# Patient Record
Sex: Female | Born: 1965 | Race: White | Hispanic: No | Marital: Married | Smoking: Never smoker
Health system: Southern US, Community
[De-identification: ages and names within clinical notes are randomized; demographics above are authoritative.]

## PROBLEM LIST (undated history)

## (undated) ENCOUNTER — Emergency Department: Discharge status: Absent without leave | Payer: Self-pay | Source: Home / Self Care

## (undated) DIAGNOSIS — I10 Essential (primary) hypertension: Secondary | ICD-10-CM

## (undated) DIAGNOSIS — E785 Hyperlipidemia, unspecified: Secondary | ICD-10-CM

## (undated) DIAGNOSIS — M1990 Unspecified osteoarthritis, unspecified site: Secondary | ICD-10-CM

## (undated) DIAGNOSIS — E119 Type 2 diabetes mellitus without complications: Secondary | ICD-10-CM

## (undated) DIAGNOSIS — E669 Obesity, unspecified: Secondary | ICD-10-CM

## (undated) DIAGNOSIS — M549 Dorsalgia, unspecified: Secondary | ICD-10-CM

## (undated) DIAGNOSIS — E039 Hypothyroidism, unspecified: Secondary | ICD-10-CM

## (undated) DIAGNOSIS — F429 Obsessive-compulsive disorder, unspecified: Secondary | ICD-10-CM

## (undated) DIAGNOSIS — I773 Arterial fibromuscular dysplasia: Secondary | ICD-10-CM

## (undated) DIAGNOSIS — F32A Depression, unspecified: Secondary | ICD-10-CM

## (undated) DIAGNOSIS — M2550 Pain in unspecified joint: Secondary | ICD-10-CM

## (undated) DIAGNOSIS — D649 Anemia, unspecified: Secondary | ICD-10-CM

## (undated) DIAGNOSIS — M4307 Spondylolysis, lumbosacral region: Secondary | ICD-10-CM

## (undated) DIAGNOSIS — F419 Anxiety disorder, unspecified: Secondary | ICD-10-CM

## (undated) DIAGNOSIS — K219 Gastro-esophageal reflux disease without esophagitis: Secondary | ICD-10-CM

## (undated) DIAGNOSIS — Z8759 Personal history of other complications of pregnancy, childbirth and the puerperium: Secondary | ICD-10-CM

## (undated) DIAGNOSIS — T7840XA Allergy, unspecified, initial encounter: Secondary | ICD-10-CM

## (undated) DIAGNOSIS — E079 Disorder of thyroid, unspecified: Secondary | ICD-10-CM

## (undated) DIAGNOSIS — G5600 Carpal tunnel syndrome, unspecified upper limb: Secondary | ICD-10-CM

## (undated) DIAGNOSIS — Z8739 Personal history of other diseases of the musculoskeletal system and connective tissue: Secondary | ICD-10-CM

## (undated) DIAGNOSIS — F329 Major depressive disorder, single episode, unspecified: Secondary | ICD-10-CM

## (undated) HISTORY — DX: Obesity, unspecified: E66.9

## (undated) HISTORY — DX: Personal history of other complications of pregnancy, childbirth and the puerperium: Z87.59

## (undated) HISTORY — PX: TUBAL LIGATION: SHX77

## (undated) HISTORY — DX: Spondylolysis, lumbosacral region: M43.07

## (undated) HISTORY — DX: Obsessive-compulsive disorder, unspecified: F42.9

## (undated) HISTORY — DX: Allergy, unspecified, initial encounter: T78.40XA

## (undated) HISTORY — PX: ESOPHAGOGASTRODUODENOSCOPY: SHX1529

## (undated) HISTORY — DX: Anxiety disorder, unspecified: F41.9

## (undated) HISTORY — PX: SPINE SURGERY: SHX786

## (undated) HISTORY — DX: Unspecified osteoarthritis, unspecified site: M19.90

## (undated) HISTORY — PX: ECTOPIC PREGNANCY SURGERY: SHX613

## (undated) HISTORY — DX: Pain in unspecified joint: M25.50

## (undated) HISTORY — DX: Hypothyroidism, unspecified: E03.9

## (undated) HISTORY — DX: Arterial fibromuscular dysplasia: I77.3

## (undated) HISTORY — DX: Anemia, unspecified: D64.9

## (undated) HISTORY — PX: ENDOMETRIAL ABLATION: SHX621

## (undated) HISTORY — DX: Carpal tunnel syndrome, unspecified upper limb: G56.00

## (undated) HISTORY — DX: Personal history of other diseases of the musculoskeletal system and connective tissue: Z87.39

## (undated) HISTORY — PX: BACK SURGERY: SHX140

## (undated) HISTORY — PX: UPPER GASTROINTESTINAL ENDOSCOPY: SHX188

## (undated) HISTORY — DX: Dorsalgia, unspecified: M54.9

## (undated) HISTORY — DX: Depression, unspecified: F32.A

## (undated) HISTORY — DX: Type 2 diabetes mellitus without complications: E11.9

## (undated) HISTORY — DX: Gastro-esophageal reflux disease without esophagitis: K21.9

## (undated) HISTORY — DX: Essential (primary) hypertension: I10

---
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
## 2013-05-12 DIAGNOSIS — F329 Major depressive disorder, single episode, unspecified: Secondary | ICD-10-CM | POA: Insufficient documentation

## 2013-08-22 ENCOUNTER — Ambulatory Visit: Payer: 59 | Admitting: Physical Therapy

## 2013-08-22 DIAGNOSIS — IMO0002 Reserved for concepts with insufficient information to code with codable children: Secondary | ICD-10-CM

## 2013-08-22 DIAGNOSIS — M25569 Pain in unspecified knee: Secondary | ICD-10-CM

## 2013-08-22 DIAGNOSIS — M6281 Muscle weakness (generalized): Secondary | ICD-10-CM

## 2013-08-22 DIAGNOSIS — M5126 Other intervertebral disc displacement, lumbar region: Secondary | ICD-10-CM

## 2013-08-22 DIAGNOSIS — M2560 Stiffness of unspecified joint, not elsewhere classified: Secondary | ICD-10-CM

## 2013-08-24 DIAGNOSIS — M6281 Muscle weakness (generalized): Secondary | ICD-10-CM

## 2013-08-24 DIAGNOSIS — IMO0002 Reserved for concepts with insufficient information to code with codable children: Secondary | ICD-10-CM

## 2013-08-24 DIAGNOSIS — M5126 Other intervertebral disc displacement, lumbar region: Secondary | ICD-10-CM

## 2013-08-24 DIAGNOSIS — M2560 Stiffness of unspecified joint, not elsewhere classified: Secondary | ICD-10-CM

## 2013-08-24 DIAGNOSIS — M25569 Pain in unspecified knee: Secondary | ICD-10-CM

## 2013-08-28 ENCOUNTER — Encounter: Payer: 59 | Admitting: Physical Therapy

## 2013-08-28 DIAGNOSIS — M5126 Other intervertebral disc displacement, lumbar region: Secondary | ICD-10-CM

## 2013-08-28 DIAGNOSIS — M6281 Muscle weakness (generalized): Secondary | ICD-10-CM

## 2013-08-28 DIAGNOSIS — M2560 Stiffness of unspecified joint, not elsewhere classified: Secondary | ICD-10-CM

## 2013-08-28 DIAGNOSIS — IMO0002 Reserved for concepts with insufficient information to code with codable children: Secondary | ICD-10-CM

## 2013-08-28 DIAGNOSIS — M25569 Pain in unspecified knee: Secondary | ICD-10-CM

## 2013-08-30 ENCOUNTER — Encounter: Payer: 59 | Admitting: Physical Therapy

## 2013-08-30 DIAGNOSIS — M25569 Pain in unspecified knee: Secondary | ICD-10-CM

## 2013-08-30 DIAGNOSIS — IMO0002 Reserved for concepts with insufficient information to code with codable children: Secondary | ICD-10-CM

## 2013-08-30 DIAGNOSIS — M2560 Stiffness of unspecified joint, not elsewhere classified: Secondary | ICD-10-CM

## 2013-08-30 DIAGNOSIS — M6281 Muscle weakness (generalized): Secondary | ICD-10-CM

## 2013-08-30 DIAGNOSIS — M5126 Other intervertebral disc displacement, lumbar region: Secondary | ICD-10-CM

## 2013-09-04 ENCOUNTER — Encounter: Payer: 59 | Admitting: Physical Therapy

## 2013-09-04 DIAGNOSIS — M2560 Stiffness of unspecified joint, not elsewhere classified: Secondary | ICD-10-CM

## 2013-09-04 DIAGNOSIS — M5126 Other intervertebral disc displacement, lumbar region: Secondary | ICD-10-CM

## 2013-09-04 DIAGNOSIS — M6281 Muscle weakness (generalized): Secondary | ICD-10-CM

## 2013-09-04 DIAGNOSIS — IMO0002 Reserved for concepts with insufficient information to code with codable children: Secondary | ICD-10-CM

## 2013-09-04 DIAGNOSIS — M25569 Pain in unspecified knee: Secondary | ICD-10-CM

## 2013-09-06 ENCOUNTER — Encounter: Payer: 59 | Admitting: Physical Therapy

## 2013-09-06 DIAGNOSIS — M6281 Muscle weakness (generalized): Secondary | ICD-10-CM

## 2013-09-06 DIAGNOSIS — IMO0002 Reserved for concepts with insufficient information to code with codable children: Secondary | ICD-10-CM

## 2013-09-06 DIAGNOSIS — M5126 Other intervertebral disc displacement, lumbar region: Secondary | ICD-10-CM

## 2013-09-06 DIAGNOSIS — M2560 Stiffness of unspecified joint, not elsewhere classified: Secondary | ICD-10-CM

## 2013-09-11 ENCOUNTER — Encounter: Payer: 59 | Admitting: Physical Therapy

## 2013-09-11 DIAGNOSIS — M5126 Other intervertebral disc displacement, lumbar region: Secondary | ICD-10-CM

## 2013-09-11 DIAGNOSIS — M6281 Muscle weakness (generalized): Secondary | ICD-10-CM

## 2013-09-11 DIAGNOSIS — IMO0002 Reserved for concepts with insufficient information to code with codable children: Secondary | ICD-10-CM

## 2013-09-11 DIAGNOSIS — M2560 Stiffness of unspecified joint, not elsewhere classified: Secondary | ICD-10-CM

## 2013-09-11 DIAGNOSIS — M25569 Pain in unspecified knee: Secondary | ICD-10-CM

## 2013-09-12 ENCOUNTER — Encounter: Payer: Self-pay | Admitting: Emergency Medicine

## 2013-09-12 ENCOUNTER — Emergency Department (INDEPENDENT_AMBULATORY_CARE_PROVIDER_SITE_OTHER)
Admission: EM | Admit: 2013-09-12 | Discharge: 2013-09-12 | Disposition: A | Discharge status: Home / Self Care | Payer: 59 | Source: Home / Self Care | Attending: Emergency Medicine | Admitting: Emergency Medicine

## 2013-09-12 ENCOUNTER — Encounter: Payer: 59 | Admitting: Physical Therapy

## 2013-09-12 DIAGNOSIS — M25569 Pain in unspecified knee: Secondary | ICD-10-CM

## 2013-09-12 DIAGNOSIS — IMO0002 Reserved for concepts with insufficient information to code with codable children: Secondary | ICD-10-CM

## 2013-09-12 DIAGNOSIS — M5126 Other intervertebral disc displacement, lumbar region: Secondary | ICD-10-CM

## 2013-09-12 DIAGNOSIS — M2560 Stiffness of unspecified joint, not elsewhere classified: Secondary | ICD-10-CM

## 2013-09-12 DIAGNOSIS — J0100 Acute maxillary sinusitis, unspecified: Secondary | ICD-10-CM

## 2013-09-12 HISTORY — DX: Hyperlipidemia, unspecified: E78.5

## 2013-09-12 HISTORY — DX: Disorder of thyroid, unspecified: E07.9

## 2013-09-12 MED ORDER — PREDNISONE (PAK) 10 MG PO TABS: ORAL_TABLET | ORAL | Status: DC

## 2013-09-12 MED ORDER — AMOXICILLIN 875 MG PO TABS: 875.0000 mg | ORAL_TABLET | Freq: Two times a day (BID) | ORAL | Status: DC

## 2013-09-12 NOTE — ED Provider Notes (Signed)
CSN: [id number]     Arrival date & time 09/12/13  1241 History   First MD Initiated Contact with Patient 09/12/13 1312     Chief Complaint  Patient presents with  . Nasal Congestion   (Consider location/radiation/quality/duration/timing/severity/associated sxs/prior Treatment) HPI URI HISTORY  Kelsey Snow is a 47 y.o. female who complains of onset of cold symptoms for 3 days.--She feels both infection and allergy. In the past, amoxicillin and prednisone have helped for similar symptoms.  Have been using over-the-counter treatment which helps minimally.  She had lumbar disc surgery 07/21/2013, and her recovery is going well and her back pain is improving week by week. Going to physical therapy today, and physical therapist advised she be seen here to evaluate and treat the cold symptoms  No chills/sweats + Low-grade Fever  +  Nasal congestion +  Discolored Post-nasal drainage Positive sinus pain/pressure No sore throat  No significant cough No wheezing No chest congestion No hemoptysis No shortness of breath No pleuritic pain  No itchy/red eyes No earache  No nausea No vomiting No abdominal pain No diarrhea  No skin rashes +  Fatigue No myalgias No headache  Past Medical History  Diagnosis Date  . Thyroid disease   . Hyperlipidemia    Past Surgical History  Procedure Laterality Date  . Tubal ligation    . Back surgery     Family History  Problem Relation Age of Onset  . Aneurysm Father    History  Substance Use Topics  . Smoking status: Never Smoker   . Smokeless tobacco: Never Used  . Alcohol Use: Yes   OB History   Grav Para Term Preterm Abortions TAB SAB Ect Mult Living                 Review of Systems  All other systems reviewed and are negative.    Allergies  Review of patient's allergies indicates no known allergies.  Home Medications   Current Outpatient Rx  Name  Route  Sig  Dispense  Refill  . levothyroxine (SYNTHROID, LEVOTHROID) 112  MCG tablet   Oral   Take 112 mcg by mouth daily before breakfast.         . omeprazole (PRILOSEC) 10 MG capsule   Oral   Take 10 mg by mouth daily.         . rosuvastatin (CRESTOR) 10 MG tablet   Oral   Take 10 mg by mouth daily.         Marland Kitchen amoxicillin (AMOXIL) 875 MG tablet   Oral   Take 1 tablet (875 mg total) by mouth 2 (two) times daily. Take for 10 days.   20 tablet   0   . predniSONE (STERAPRED UNI-PAK) 10 MG tablet      Take as directed for 6 days.--Take 6 on day 1, 5 on day 2, 4 on day 3, then 3 tablets on day 4, then 2 tablets on day 5, then 1 on day 6.   21 tablet   0    BP 135/79  Pulse 80  Temp(Src) 98.1 F (36.7 C) (Oral)  Resp 14  Ht 5\' 5"  (1.651 m)  Wt 265 lb (120.203 kg)  BMI 44.10 kg/m2  SpO2 97% Physical Exam  Nursing note and vitals reviewed. Constitutional: She is oriented to person, place, and time. She appears well-developed and well-nourished. No distress.  HENT:  Head: Normocephalic and atraumatic.  Right Ear: Tympanic membrane, external ear and ear canal normal.  Left  Ear: Tympanic membrane, external ear and ear canal normal.  Nose: Mucosal edema and rhinorrhea present. Right sinus exhibits maxillary sinus tenderness. Left sinus exhibits maxillary sinus tenderness.  Mouth/Throat: Oropharynx is clear and moist. No oral lesions. No oropharyngeal exudate.  Eyes: Right eye exhibits no discharge. Left eye exhibits no discharge. No scleral icterus.  Neck: Neck supple.  Cardiovascular: Normal rate, regular rhythm and normal heart sounds.   Pulmonary/Chest: Effort normal and breath sounds normal. She has no wheezes. She has no rales.  Lymphadenopathy:    She has no cervical adenopathy.  Neurological: She is alert and oriented to person, place, and time.  Skin: Skin is warm and dry.    ED Course  Procedures (including critical care time) Labs Review Labs Reviewed - No data to display Imaging Review No results found.  EKG Interpretation     Date/Time:    Ventricular Rate:    PR Interval:    QRS Duration:   QT Interval:    QTC Calculation:   R Axis:     Text Interpretation:              MDM   1. Acute maxillary sinusitis    Risks, benefits, alternatives discussed with patient and husband  Amoxicillin and prednisone Dosepak prescribed Use Flonase which she has at home Other symptomatic care discussed Both she and husband voiced understanding and agreement with the above. Precautions discussed. Red flags discussed. Questions invited and answered. Patient voiced understanding and agreement.  Lajean Manes, MD 09/12/13 [phone]

## 2013-09-12 NOTE — ED Notes (Signed)
Kelsey Snow c/o congestion, cough  And sneezing x 2-3 days. Denies fever. She has had a flu vaccine this year.

## 2013-09-13 ENCOUNTER — Encounter: Payer: 59 | Admitting: Physical Therapy

## 2013-09-19 ENCOUNTER — Encounter: Payer: 59 | Admitting: Physical Therapy

## 2013-09-19 DIAGNOSIS — M5126 Other intervertebral disc displacement, lumbar region: Secondary | ICD-10-CM

## 2013-09-19 DIAGNOSIS — M2560 Stiffness of unspecified joint, not elsewhere classified: Secondary | ICD-10-CM

## 2013-09-19 DIAGNOSIS — M25569 Pain in unspecified knee: Secondary | ICD-10-CM

## 2013-09-19 DIAGNOSIS — IMO0002 Reserved for concepts with insufficient information to code with codable children: Secondary | ICD-10-CM

## 2013-09-26 ENCOUNTER — Encounter: Payer: 59 | Admitting: Physical Therapy

## 2013-09-26 DIAGNOSIS — M5126 Other intervertebral disc displacement, lumbar region: Secondary | ICD-10-CM

## 2013-09-26 DIAGNOSIS — IMO0002 Reserved for concepts with insufficient information to code with codable children: Secondary | ICD-10-CM

## 2013-09-26 DIAGNOSIS — M25569 Pain in unspecified knee: Secondary | ICD-10-CM

## 2013-09-26 DIAGNOSIS — M6281 Muscle weakness (generalized): Secondary | ICD-10-CM

## 2013-09-26 DIAGNOSIS — M2560 Stiffness of unspecified joint, not elsewhere classified: Secondary | ICD-10-CM

## 2013-09-28 ENCOUNTER — Encounter: Payer: 59 | Admitting: Physical Therapy

## 2013-09-28 DIAGNOSIS — M5126 Other intervertebral disc displacement, lumbar region: Secondary | ICD-10-CM

## 2013-09-28 DIAGNOSIS — M6281 Muscle weakness (generalized): Secondary | ICD-10-CM

## 2013-09-28 DIAGNOSIS — M25569 Pain in unspecified knee: Secondary | ICD-10-CM

## 2013-09-28 DIAGNOSIS — IMO0002 Reserved for concepts with insufficient information to code with codable children: Secondary | ICD-10-CM

## 2013-09-28 DIAGNOSIS — M2560 Stiffness of unspecified joint, not elsewhere classified: Secondary | ICD-10-CM

## 2013-10-02 ENCOUNTER — Encounter (INDEPENDENT_AMBULATORY_CARE_PROVIDER_SITE_OTHER): Payer: 59 | Admitting: Physical Therapy

## 2013-10-02 DIAGNOSIS — M2560 Stiffness of unspecified joint, not elsewhere classified: Secondary | ICD-10-CM

## 2013-10-02 DIAGNOSIS — M6281 Muscle weakness (generalized): Secondary | ICD-10-CM

## 2013-10-02 DIAGNOSIS — M25569 Pain in unspecified knee: Secondary | ICD-10-CM

## 2013-10-02 DIAGNOSIS — M5126 Other intervertebral disc displacement, lumbar region: Secondary | ICD-10-CM

## 2013-10-02 DIAGNOSIS — IMO0002 Reserved for concepts with insufficient information to code with codable children: Secondary | ICD-10-CM

## 2013-10-09 ENCOUNTER — Encounter (INDEPENDENT_AMBULATORY_CARE_PROVIDER_SITE_OTHER): Payer: 59 | Admitting: Physical Therapy

## 2013-10-09 DIAGNOSIS — M5126 Other intervertebral disc displacement, lumbar region: Secondary | ICD-10-CM

## 2013-10-09 DIAGNOSIS — M6281 Muscle weakness (generalized): Secondary | ICD-10-CM

## 2013-10-09 DIAGNOSIS — M2560 Stiffness of unspecified joint, not elsewhere classified: Secondary | ICD-10-CM

## 2013-10-09 DIAGNOSIS — IMO0002 Reserved for concepts with insufficient information to code with codable children: Secondary | ICD-10-CM

## 2013-10-09 DIAGNOSIS — M25569 Pain in unspecified knee: Secondary | ICD-10-CM

## 2013-10-10 ENCOUNTER — Encounter (HOSPITAL_COMMUNITY): Payer: Self-pay

## 2013-10-10 ENCOUNTER — Ambulatory Visit (INDEPENDENT_AMBULATORY_CARE_PROVIDER_SITE_OTHER): Payer: 59

## 2013-10-10 ENCOUNTER — Ambulatory Visit (INDEPENDENT_AMBULATORY_CARE_PROVIDER_SITE_OTHER): Payer: 59 | Admitting: Sports Medicine

## 2013-10-10 ENCOUNTER — Encounter: Payer: Self-pay | Admitting: Sports Medicine

## 2013-10-10 VITALS — BP 131/67 | HR 78 | Ht 65.0 in | Wt 272.0 lb

## 2013-10-10 DIAGNOSIS — Z299 Encounter for prophylactic measures, unspecified: Secondary | ICD-10-CM

## 2013-10-10 DIAGNOSIS — M51369 Other intervertebral disc degeneration, lumbar region without mention of lumbar back pain or lower extremity pain: Secondary | ICD-10-CM | POA: Insufficient documentation

## 2013-10-10 DIAGNOSIS — E669 Obesity, unspecified: Secondary | ICD-10-CM | POA: Insufficient documentation

## 2013-10-10 DIAGNOSIS — M47812 Spondylosis without myelopathy or radiculopathy, cervical region: Secondary | ICD-10-CM

## 2013-10-10 DIAGNOSIS — E039 Hypothyroidism, unspecified: Secondary | ICD-10-CM

## 2013-10-10 DIAGNOSIS — N951 Menopausal and female climacteric states: Secondary | ICD-10-CM

## 2013-10-10 DIAGNOSIS — F429 Obsessive-compulsive disorder, unspecified: Secondary | ICD-10-CM

## 2013-10-10 DIAGNOSIS — M5030 Other cervical disc degeneration, unspecified cervical region: Secondary | ICD-10-CM

## 2013-10-10 DIAGNOSIS — Z0000 Encounter for general adult medical examination without abnormal findings: Secondary | ICD-10-CM | POA: Insufficient documentation

## 2013-10-10 DIAGNOSIS — M51379 Other intervertebral disc degeneration, lumbosacral region without mention of lumbar back pain or lower extremity pain: Secondary | ICD-10-CM

## 2013-10-10 DIAGNOSIS — M542 Cervicalgia: Secondary | ICD-10-CM

## 2013-10-10 DIAGNOSIS — M5137 Other intervertebral disc degeneration, lumbosacral region: Secondary | ICD-10-CM

## 2013-10-10 DIAGNOSIS — M5136 Other intervertebral disc degeneration, lumbar region: Secondary | ICD-10-CM

## 2013-10-10 MED ORDER — GABAPENTIN 300 MG PO CAPS: ORAL_CAPSULE | ORAL | Status: DC

## 2013-10-10 MED ORDER — ESCITALOPRAM OXALATE 20 MG PO TABS: 20.0000 mg | ORAL_TABLET | Freq: Every day | ORAL | Status: DC

## 2013-10-10 MED ORDER — PHENTERMINE HCL 37.5 MG PO CAPS: 37.5000 mg | ORAL_CAPSULE | ORAL | Status: DC

## 2013-10-10 NOTE — Assessment & Plan Note (Signed)
Nutrition referral, phentermine, exercise prescription. Return monthly for weight checks and refills.

## 2013-10-10 NOTE — Assessment & Plan Note (Signed)
Home rehabilitation. X-rays. Gabapentin as below. Return in one month to discuss this.

## 2013-10-10 NOTE — Assessment & Plan Note (Signed)
Pap smear was normal on Mar 17, 2013, mammogram was normal on Mar 17, 2013, routine blood work has been normal in August of 2014, and will be entered in.

## 2013-10-10 NOTE — Assessment & Plan Note (Signed)
Status post discectomy with fusion per patient. Adding gabapentin, this will also help with her vasomotor instability due to her perimenopausal status.

## 2013-10-10 NOTE — Assessment & Plan Note (Signed)
Increasing Lexapro to 20 mg daily per patient request. Referral downstairs to psychiatry for maintenance treatment.

## 2013-10-10 NOTE — Progress Notes (Signed)
  Subjective:    CC: Establish care.   HPI:  Obsessive compulsive disorder: Currently taking Lexapro, does not have a psychiatrist. Desires to go on Lexapro and she is having some increased anxiety.  Hypothyroidism: Stable on levo thyroxine.  Lumbar degenerative disc disease: Status post lumbar fusion, will continue followup with her surgeon.  Neck pain: Moderate, persistent, localized.  Perimenopausal: Has vasomotor instability, currently taking Lexapro and also desires to go up to decrease her hot flashes.  Obesity: Has tried diet and exercise, desires physician assistance.  Preventive measures: Up-to-date.  Past medical history, Surgical history, Family history not pertinant except as noted below, Social history, Allergies, and medications have been entered into the medical record, reviewed, and no changes needed.   Review of Systems: No headache, visual changes, nausea, vomiting, diarrhea, constipation, dizziness, abdominal pain, skin rash, fevers, chills, night sweats, swollen lymph nodes, weight loss, chest pain, body aches, joint swelling, muscle aches, shortness of breath, mood changes, visual or auditory hallucinations.  Objective:    General: Well Developed, well nourished, and in no acute distress.  Neuro: Alert and oriented x3, extra-ocular muscles intact, sensation grossly intact.  HEENT: Normocephalic, atraumatic, pupils equal round reactive to light, neck supple, no masses, no lymphadenopathy, thyroid nonpalpable.  Skin: Warm and dry, no rashes noted.  Cardiac: Regular rate and rhythm, no murmurs rubs or gallops.  Respiratory: Clear to auscultation bilaterally. Not using accessory muscles, speaking in full sentences.  Abdominal: Soft, nontender, nondistended, positive bowel sounds, no masses, no organomegaly.  Musculoskeletal: Shoulder, elbow, wrist, hip, knee, ankle stable, and with full range of motion.  Impression and Recommendations:    The patient was  counselled, risk factors were discussed, anticipatory guidance given.

## 2013-10-10 NOTE — Assessment & Plan Note (Signed)
Continue Lexapro, adding gabapentin. Next insufficient response we could certainly consider hormone replacement.

## 2013-10-10 NOTE — Assessment & Plan Note (Signed)
Continue current dose of levothyroxine.

## 2013-10-11 ENCOUNTER — Encounter: Payer: 59 | Admitting: Physical Therapy

## 2013-10-13 ENCOUNTER — Encounter: Payer: Self-pay | Admitting: *Deleted

## 2013-10-16 ENCOUNTER — Ambulatory Visit: Payer: 59 | Admitting: Sports Medicine

## 2013-10-20 ENCOUNTER — Encounter: Payer: 59 | Admitting: Physical Therapy

## 2013-10-24 ENCOUNTER — Encounter (INDEPENDENT_AMBULATORY_CARE_PROVIDER_SITE_OTHER): Payer: 59 | Admitting: Physical Therapy

## 2013-10-24 DIAGNOSIS — M2560 Stiffness of unspecified joint, not elsewhere classified: Secondary | ICD-10-CM

## 2013-10-24 DIAGNOSIS — M5126 Other intervertebral disc displacement, lumbar region: Secondary | ICD-10-CM

## 2013-10-24 DIAGNOSIS — IMO0002 Reserved for concepts with insufficient information to code with codable children: Secondary | ICD-10-CM

## 2013-10-24 DIAGNOSIS — M6281 Muscle weakness (generalized): Secondary | ICD-10-CM

## 2013-10-26 ENCOUNTER — Encounter (INDEPENDENT_AMBULATORY_CARE_PROVIDER_SITE_OTHER): Payer: 59 | Admitting: Physical Therapy

## 2013-10-26 DIAGNOSIS — M5126 Other intervertebral disc displacement, lumbar region: Secondary | ICD-10-CM

## 2013-10-26 DIAGNOSIS — IMO0002 Reserved for concepts with insufficient information to code with codable children: Secondary | ICD-10-CM

## 2013-10-26 DIAGNOSIS — M25569 Pain in unspecified knee: Secondary | ICD-10-CM

## 2013-10-26 DIAGNOSIS — M2560 Stiffness of unspecified joint, not elsewhere classified: Secondary | ICD-10-CM

## 2013-10-26 DIAGNOSIS — M6281 Muscle weakness (generalized): Secondary | ICD-10-CM

## 2013-10-30 ENCOUNTER — Encounter (INDEPENDENT_AMBULATORY_CARE_PROVIDER_SITE_OTHER): Payer: 59 | Admitting: Physical Therapy

## 2013-10-30 DIAGNOSIS — M5126 Other intervertebral disc displacement, lumbar region: Secondary | ICD-10-CM

## 2013-10-30 DIAGNOSIS — M25569 Pain in unspecified knee: Secondary | ICD-10-CM

## 2013-10-30 DIAGNOSIS — M2560 Stiffness of unspecified joint, not elsewhere classified: Secondary | ICD-10-CM

## 2013-10-30 DIAGNOSIS — IMO0002 Reserved for concepts with insufficient information to code with codable children: Secondary | ICD-10-CM

## 2013-10-30 DIAGNOSIS — M6281 Muscle weakness (generalized): Secondary | ICD-10-CM

## 2013-11-01 ENCOUNTER — Encounter: Payer: 59 | Admitting: Physical Therapy

## 2013-11-06 ENCOUNTER — Encounter (INDEPENDENT_AMBULATORY_CARE_PROVIDER_SITE_OTHER): Payer: 59 | Admitting: Physical Therapy

## 2013-11-06 DIAGNOSIS — IMO0002 Reserved for concepts with insufficient information to code with codable children: Secondary | ICD-10-CM

## 2013-11-06 DIAGNOSIS — M5126 Other intervertebral disc displacement, lumbar region: Secondary | ICD-10-CM

## 2013-11-06 DIAGNOSIS — M2560 Stiffness of unspecified joint, not elsewhere classified: Secondary | ICD-10-CM

## 2013-11-06 DIAGNOSIS — M25569 Pain in unspecified knee: Secondary | ICD-10-CM

## 2013-11-06 DIAGNOSIS — M6281 Muscle weakness (generalized): Secondary | ICD-10-CM

## 2013-11-16 ENCOUNTER — Ambulatory Visit (INDEPENDENT_AMBULATORY_CARE_PROVIDER_SITE_OTHER): Payer: 59 | Admitting: Sports Medicine

## 2013-11-16 ENCOUNTER — Encounter: Payer: Self-pay | Admitting: Sports Medicine

## 2013-11-16 VITALS — BP 137/86 | HR 68 | Ht 65.0 in | Wt 263.0 lb

## 2013-11-16 DIAGNOSIS — J309 Allergic rhinitis, unspecified: Secondary | ICD-10-CM

## 2013-11-16 DIAGNOSIS — N920 Excessive and frequent menstruation with regular cycle: Secondary | ICD-10-CM | POA: Insufficient documentation

## 2013-11-16 DIAGNOSIS — M51379 Other intervertebral disc degeneration, lumbosacral region without mention of lumbar back pain or lower extremity pain: Secondary | ICD-10-CM

## 2013-11-16 DIAGNOSIS — M5136 Other intervertebral disc degeneration, lumbar region: Secondary | ICD-10-CM

## 2013-11-16 DIAGNOSIS — L918 Other hypertrophic disorders of the skin: Secondary | ICD-10-CM | POA: Insufficient documentation

## 2013-11-16 DIAGNOSIS — M51369 Other intervertebral disc degeneration, lumbar region without mention of lumbar back pain or lower extremity pain: Secondary | ICD-10-CM

## 2013-11-16 DIAGNOSIS — E669 Obesity, unspecified: Secondary | ICD-10-CM

## 2013-11-16 DIAGNOSIS — L821 Other seborrheic keratosis: Secondary | ICD-10-CM

## 2013-11-16 DIAGNOSIS — J3089 Other allergic rhinitis: Secondary | ICD-10-CM | POA: Insufficient documentation

## 2013-11-16 DIAGNOSIS — N898 Other specified noninflammatory disorders of vagina: Secondary | ICD-10-CM

## 2013-11-16 DIAGNOSIS — M5137 Other intervertebral disc degeneration, lumbosacral region: Secondary | ICD-10-CM

## 2013-11-16 MED ORDER — PHENTERMINE HCL 37.5 MG PO CAPS: 37.5000 mg | ORAL_CAPSULE | ORAL | Status: DC

## 2013-11-16 NOTE — Assessment & Plan Note (Signed)
10 pound weight loss, refilling medication. Return in one month.

## 2013-11-16 NOTE — Patient Instructions (Signed)
We should make separate visits to discuss each of your issues in detail.

## 2013-11-16 NOTE — Assessment & Plan Note (Signed)
Unclear etiology, she will return to see me at my next available slot for this.

## 2013-11-16 NOTE — Progress Notes (Signed)
  Subjective:    CC: Followup  HPI: Obesity: 10 pound weight loss, no side effects.  Skin lesions: Points to a couple of lesions on her left abdomen and left chest, these will be described below. They have been present for a long time and are unchanging.  Spotting: Does have an appointment with her OB/GYN agrees to discuss this with me at a future visit.  Lumbar degenerative disc disease: Decided not to take the gabapentin, so symptoms are the same.  Past medical history, Surgical history, Family history not pertinant except as noted below, Social history, Allergies, and medications have been entered into the medical record, reviewed, and no changes needed.   Review of Systems: No fevers, chills, night sweats, weight loss, chest pain, or shortness of breath.   Objective:    General: Well Developed, well nourished, and in no acute distress.  Neuro: Alert and oriented x3, extra-ocular muscles intact, sensation grossly intact.  HEENT: Normocephalic, atraumatic, pupils equal round reactive to light, neck supple, no masses, no lymphadenopathy, thyroid nonpalpable.  Skin: Warm and dry, no rashes. There are 2 separate keratoses approximately 1 cm across, on the left abdomen, and one on the left upper chest. Cardiac: Regular rate and rhythm, no murmurs rubs or gallops, no lower extremity edema.  Respiratory: Clear to auscultation bilaterally. Not using accessory muscles, speaking in full sentences.  Impression and Recommendations:

## 2013-11-16 NOTE — Assessment & Plan Note (Signed)
Wanted me to look at this but declines any treatment.

## 2013-11-16 NOTE — Assessment & Plan Note (Signed)
Persistent nasal discharge, declines use of any intranasal spray, return as needed for this.

## 2013-11-16 NOTE — Assessment & Plan Note (Signed)
Still with left-sided radicular symptoms, decided not to use the gabapentin. Followup as needed for this.

## 2013-11-21 ENCOUNTER — Ambulatory Visit (HOSPITAL_COMMUNITY): Payer: 59 | Admitting: Psychiatry

## 2013-12-13 ENCOUNTER — Ambulatory Visit (INDEPENDENT_AMBULATORY_CARE_PROVIDER_SITE_OTHER): Payer: 59 | Admitting: Sports Medicine

## 2013-12-13 ENCOUNTER — Encounter: Payer: Self-pay | Admitting: Sports Medicine

## 2013-12-13 VITALS — BP 130/80 | HR 76 | Ht 65.0 in | Wt 256.0 lb

## 2013-12-13 DIAGNOSIS — E669 Obesity, unspecified: Secondary | ICD-10-CM

## 2013-12-13 MED ORDER — PHENTERMINE HCL 37.5 MG PO CAPS: 37.5000 mg | ORAL_CAPSULE | ORAL | Status: DC

## 2013-12-13 NOTE — Assessment & Plan Note (Signed)
20 pound total weight loss. Refilling phentermine, return to see me for another recheck in a month.

## 2013-12-13 NOTE — Progress Notes (Deleted)

## 2013-12-13 NOTE — Progress Notes (Signed)
  Subjective:    CC: Weight check  HPI: Kelsey Snow is a pleasant 48 yo female who presents for a weight check. She was 263lb last visit and is 256lb today with no side effects. She is exercising 15 min on a stationary bike 15 min every other day. She would like to do more, but is prevented due to back pain. She is still seeing a nutritionist and keeping a food diary. She would like to continue the phentermine because she feels it is helping her decrease her portions and stop eating snacks.  Past medical history, Surgical history, Family history not pertinant except as noted below, Social history, Allergies, and medications have been entered into the medical record, reviewed, and no changes needed.   Review of Systems: No fevers, chills, night sweats, weight loss, chest pain, or shortness of breath.   Objective:    General: Well Developed, well nourished, and in no acute distress.  Neuro: Alert and oriented x3, extra-ocular muscles intact, sensation grossly intact.  HEENT: Normocephalic, atraumatic, pupils equal round reactive to light, neck supple, no masses, no lymphadenopathy, thyroid nonpalpable.  Skin: Warm and dry, no rashes. Cardiac: Regular rate and rhythm, no murmurs rubs or gallops, no lower extremity edema.  Respiratory: Clear to auscultation bilaterally. Not using accessory muscles, speaking in full sentences.  Impression and Recommendations:   I spent 25 minutes with this patient, greater than 50% was face-to-face time counseling regarding the above diagnosis.

## 2013-12-14 ENCOUNTER — Ambulatory Visit: Payer: 59 | Admitting: Sports Medicine

## 2013-12-19 ENCOUNTER — Ambulatory Visit: Payer: 59 | Admitting: Sports Medicine

## 2014-01-01 ENCOUNTER — Other Ambulatory Visit: Payer: Self-pay | Admitting: Sports Medicine

## 2014-01-02 ENCOUNTER — Telehealth: Payer: Self-pay

## 2014-01-02 DIAGNOSIS — E785 Hyperlipidemia, unspecified: Secondary | ICD-10-CM

## 2014-01-02 NOTE — Telephone Encounter (Signed)
Left message on patient vm letting her know that order for labs has been placed.  ,CMA

## 2014-01-02 NOTE — Telephone Encounter (Signed)
Fasting labs ordered

## 2014-01-02 NOTE — Telephone Encounter (Signed)
Spoke to patient she request an order for labs to be done because she has not had any done within the last 6 months. She wants to make sure it is really necessary to change the dosage of her Crestor.   ,CMA

## 2014-01-08 ENCOUNTER — Telehealth: Payer: Self-pay

## 2014-01-08 DIAGNOSIS — M1990 Unspecified osteoarthritis, unspecified site: Secondary | ICD-10-CM

## 2014-01-08 NOTE — Telephone Encounter (Signed)
Labs in box.

## 2014-01-08 NOTE — Telephone Encounter (Signed)
Patient called request a lab order for TSH,cholesterol, hemoglobin and arthritic panel please advise patient has appt scheduled for 01/11/2014 and she is coming in fasting so she would like to get it done before her appt.  ,CMA

## 2014-01-09 NOTE — Telephone Encounter (Signed)
Patient has been informed.  ,CMA  

## 2014-01-11 ENCOUNTER — Other Ambulatory Visit: Payer: Self-pay | Admitting: Sports Medicine

## 2014-01-11 ENCOUNTER — Ambulatory Visit (INDEPENDENT_AMBULATORY_CARE_PROVIDER_SITE_OTHER): Payer: 59 | Admitting: Sports Medicine

## 2014-01-11 ENCOUNTER — Encounter: Payer: Self-pay | Admitting: Sports Medicine

## 2014-01-11 VITALS — BP 131/92 | HR 65 | Ht 65.0 in | Wt 250.0 lb

## 2014-01-11 DIAGNOSIS — E669 Obesity, unspecified: Secondary | ICD-10-CM

## 2014-01-11 LAB — CBC
HCT: 40.3 % (ref 36.0–46.0)
Hemoglobin: 13.4 g/dL (ref 12.0–15.0)
MCH: 29.3 pg (ref 26.0–34.0)
MCHC: 33.3 g/dL (ref 30.0–36.0)
MCV: 88.2 fL (ref 78.0–100.0)
Platelets: 328 K/uL (ref 150–400)
RBC: 4.57 MIL/uL (ref 3.87–5.11)
RDW: 13.8 % (ref 11.5–15.5)
WBC: 4.8 10*3/uL (ref 4.0–10.5)

## 2014-01-11 LAB — COMPREHENSIVE METABOLIC PANEL
ALT: 26 U/L (ref 0–35)
Albumin: 4.3 g/dL (ref 3.5–5.2)
CO2: 25 mEq/L (ref 19–32)
Calcium: 9.4 mg/dL (ref 8.4–10.5)
Chloride: 104 mEq/L (ref 96–112)
Potassium: 4.7 mEq/L (ref 3.5–5.3)
Sodium: 137 mEq/L (ref 135–145)
Total Protein: 6.6 g/dL (ref 6.0–8.3)

## 2014-01-11 LAB — LIPID PANEL
Cholesterol: 141 mg/dL (ref 0–200)
HDL: 54 mg/dL (ref >39)
LDL Cholesterol: 70 mg/dL (ref 0–99)
Total CHOL/HDL Ratio: 2.6 Ratio
Triglycerides: 84 mg/dL (ref <150)
VLDL: 17 mg/dL (ref 0–40)

## 2014-01-11 LAB — COMPREHENSIVE METABOLIC PANEL WITH GFR
AST: 22 U/L (ref 0–37)
Alkaline Phosphatase: 36 U/L — ABNORMAL LOW (ref 39–117)
BUN: 10 mg/dL (ref 6–23)
Creat: 0.72 mg/dL (ref 0.50–1.10)
Glucose, Bld: 92 mg/dL (ref 70–99)
Total Bilirubin: 0.4 mg/dL (ref 0.2–1.2)

## 2014-01-11 LAB — SEDIMENTATION RATE: Sed Rate: 4 mm/hr (ref 0–22)

## 2014-01-11 LAB — RHEUMATOID FACTOR: Rheumatoid fact SerPl-aCnc: <10 [IU]/mL (ref <=14)

## 2014-01-11 LAB — CK: Total CK: 102 U/L (ref 7–177)

## 2014-01-11 LAB — TSH: TSH: 1.439 u[IU]/mL (ref 0.350–4.500)

## 2014-01-11 LAB — URIC ACID: Uric Acid, Serum: 3.6 mg/dL (ref 2.4–7.0)

## 2014-01-11 MED ORDER — PHENTERMINE HCL 37.5 MG PO CAPS: 37.5000 mg | ORAL_CAPSULE | ORAL | Status: DC

## 2014-01-11 NOTE — Progress Notes (Signed)
  Subjective:    CC: Follow up  HPI: Obesity: Excellent initial weight loss, unfortunately has only lost 6 pounds since last visit.  Past medical history, Surgical history, Family history not pertinant except as noted below, Social history, Allergies, and medications have been entered into the medical record, reviewed, and no changes needed.   Review of Systems: No fevers, chills, night sweats, weight loss, chest pain, or shortness of breath.   Objective:    General: Well Developed, well nourished, and in no acute distress.  Neuro: Alert and oriented x3, extra-ocular muscles intact, sensation grossly intact.  HEENT: Normocephalic, atraumatic, pupils equal round reactive to light, neck supple, no masses, no lymphadenopathy, thyroid nonpalpable.  Skin: Warm and dry, no rashes. Cardiac: Regular rate and rhythm, no murmurs rubs or gallops, no lower extremity edema.  Respiratory: Clear to auscultation bilaterally. Not using accessory muscles, speaking in full sentences.  Impression and Recommendations:

## 2014-01-11 NOTE — Assessment & Plan Note (Signed)
Additional 6 pound weight loss since the last visit. Refilling medication. Continue diet and exercise, return in one month for a weight check and refills.

## 2014-01-12 LAB — ANA: Anti Nuclear Antibody(ANA): NEGATIVE

## 2014-01-12 LAB — CYCLIC CITRUL PEPTIDE ANTIBODY, IGG: Cyclic Citrullin Peptide Ab: <2 U/mL (ref 0.0–5.0)

## 2014-01-15 ENCOUNTER — Encounter: Payer: Self-pay | Admitting: Sports Medicine

## 2014-01-15 ENCOUNTER — Ambulatory Visit (INDEPENDENT_AMBULATORY_CARE_PROVIDER_SITE_OTHER): Payer: 59 | Admitting: Sports Medicine

## 2014-01-15 VITALS — BP 127/75 | HR 69 | Ht 65.0 in | Wt 253.0 lb

## 2014-01-15 DIAGNOSIS — L821 Other seborrheic keratosis: Secondary | ICD-10-CM

## 2014-01-15 NOTE — Progress Notes (Signed)
   Procedure:  Cryodestruction of two 0.6cm seborrheic keratoses on the left chest and left lower abdomen Consent obtained and verified. Time-out conducted. Noted no overlying erythema, induration, or other signs of local infection. Completed without difficulty using Cryo-Gun. Advised to call if fevers/chills, erythema, induration, drainage, or persistent bleeding.

## 2014-01-15 NOTE — Assessment & Plan Note (Signed)
Cryotherapy performed on 2 lesions today. Return as needed. She does understand that it may take 2 separate treatments or more to make these fully resolved.

## 2014-01-15 NOTE — Patient Instructions (Signed)
Seborrheic Keratosis  Seborrheic keratosis is a common, noncancerous (benign) skin growth that can occur anywhere on the skin.It looks like "stuck-on," waxy, rough, tan, brown, or black spots on the skin. These skin growths can be flat or raised.They are often called "barnacles" because of their pasted-on appearance.Usually, these skin growths appear in adulthood, around age 47, and increase in number as you age. They may also develop during pregnancy or following estrogen therapy. Many people may only have one growth appear in their lifetime, while some people may develop many growths.  CAUSES  It is unknown what causes these skin growths, but they appear to run in families.  SYMPTOMS  Seborrheic keratosis is often located on the face, chest, shoulders, back, or other areas. These growths are:   Usually painless, but may become irritated and itchy.   Yellow, brown, black, or other colors.   Slightly raised or have a flat surface.   Sometimes rough or wart-like in texture.   Often waxy on the surface.   Round or oval-shaped.   Sometimes "stuck-on" in appearance.   Sometimes single, but there are usually many growths.  Any growth that bleeds, itches on a regular basis, becomes inflamed, or becomes irritated needs to be evaluated by a skin specialist (dermatologist).  DIAGNOSIS  Diagnosis is mainly based on the way the growths appear. In some cases, it can be difficult to tell this type of skin growth from skin cancer. A skin growth tissue sample (biopsy) may be used to confirm the diagnosis.  TREATMENT  Most often, treatment is not needed because the skin growths are benign.If the skin growth is irritated easily by clothing or jewelry, causing it to scab or bleed, treatment may be recommended. Patients may also choose to have the growths removed because they do not like their appearance. Most commonly, these growths are treated with cryosurgery.   In cryosurgery, liquid nitrogen is applied to "freeze" the growth. The growth usually falls off within a matter of days. A blister may form and dry into a scab that will also fall off. After the growth or scab falls off, it may leave a dark or light spot on the skin. This color may fade over time, or it may remain permanent on the skin.  HOME CARE INSTRUCTIONS  If the skin growths are treated with cryosurgery, the treated area needs to be kept clean with water and soap.  SEEK MEDICAL CARE IF:   You have questions about these growths or other skin problems.   You develop new symptoms, including:   A change in the appearance of the skin growth.   New growths.   Any bleeding, itching, or pain in the growths.   A skin growth that looks similar to seborrheic keratosis.  Document Released: 11/07/2010 Document Revised: 12/28/2011 Document Reviewed: 11/07/2010  [employer] Patient Information 2014 [employer]

## 2014-01-16 ENCOUNTER — Encounter: Payer: Self-pay | Admitting: Sports Medicine

## 2014-01-17 ENCOUNTER — Encounter: Payer: Self-pay | Admitting: Sports Medicine

## 2014-01-18 MED ORDER — ROSUVASTATIN CALCIUM 10 MG PO TABS: 10.0000 mg | ORAL_TABLET | Freq: Every day | ORAL | Status: DC

## 2014-01-18 MED ORDER — OMEPRAZOLE 40 MG PO CPDR: 40.0000 mg | DELAYED_RELEASE_CAPSULE | Freq: Every day | ORAL | Status: DC

## 2014-01-26 ENCOUNTER — Encounter: Payer: Self-pay | Admitting: Sports Medicine

## 2014-02-07 ENCOUNTER — Encounter: Payer: Self-pay | Admitting: Sports Medicine

## 2014-02-08 ENCOUNTER — Encounter: Payer: Self-pay | Admitting: Sports Medicine

## 2014-02-08 ENCOUNTER — Ambulatory Visit (INDEPENDENT_AMBULATORY_CARE_PROVIDER_SITE_OTHER): Payer: 59 | Admitting: Sports Medicine

## 2014-02-08 VITALS — BP 136/90 | HR 93 | Ht 65.0 in | Wt 242.0 lb

## 2014-02-08 DIAGNOSIS — E669 Obesity, unspecified: Secondary | ICD-10-CM

## 2014-02-08 DIAGNOSIS — M5137 Other intervertebral disc degeneration, lumbosacral region: Secondary | ICD-10-CM

## 2014-02-08 DIAGNOSIS — M5136 Other intervertebral disc degeneration, lumbar region: Secondary | ICD-10-CM

## 2014-02-08 DIAGNOSIS — M51379 Other intervertebral disc degeneration, lumbosacral region without mention of lumbar back pain or lower extremity pain: Secondary | ICD-10-CM

## 2014-02-08 DIAGNOSIS — M51369 Other intervertebral disc degeneration, lumbar region without mention of lumbar back pain or lower extremity pain: Secondary | ICD-10-CM

## 2014-02-08 MED ORDER — PHENTERMINE HCL 37.5 MG PO CAPS: 37.5000 mg | ORAL_CAPSULE | ORAL | Status: DC

## 2014-02-08 NOTE — Progress Notes (Signed)
  Subjective:    CC: Followup  HPI: Obesity: Additional 12 pound weight loss in the last month on phentermine, no side effects.  Lumbar degenerative disc disease: post lumbar fusion in October of last year, continues to have axial back pain and radicular symptoms similar to before her surgery, she tells me that at no point did she have any relief in her radicular symptoms. She does desire me to offer a second opinion. She understands that we will likely need to request records. Symptoms are moderate, persistent, localized bilaterally along the lower lumbar spine with radicular symptoms down the left lower leg to the foot consisting of numbness and tingling.  Past medical history, Surgical history, Family history not pertinant except as noted below, Social history, Allergies, and medications have been entered into the medical record, reviewed, and no changes needed.   Review of Systems: No fevers, chills, night sweats, weight loss, chest pain, or shortness of breath.   Objective:    General: Well Developed, well nourished, and in no acute distress.  Neuro: Alert and oriented x3, extra-ocular muscles intact, sensation grossly intact.  HEENT: Normocephalic, atraumatic, pupils equal round reactive to light, neck supple, no masses, no lymphadenopathy, thyroid nonpalpable.  Skin: Warm and dry, no rashes. Cardiac: Regular rate and rhythm, no murmurs rubs or gallops, no lower extremity edema.  Respiratory: Clear to auscultation bilaterally. Not using accessory muscles, speaking in full sentences.  Impression and Recommendations:

## 2014-02-08 NOTE — Assessment & Plan Note (Signed)
Good weight loss, refilling phentermine. Return in one month.

## 2014-02-08 NOTE — Assessment & Plan Note (Addendum)
Post lumbar fusion with persistent axial back pain, as expected. Is still not taking gabapentin due to fears about having a seizure, even though gabapentin is also an antiseizure medicine she would likely be less likely to have a seizure while on it. We will request records from her neurosurgeon's office including MRI reports and operative reports. Return in a month.

## 2014-02-13 ENCOUNTER — Encounter: Payer: Self-pay | Admitting: Sports Medicine

## 2014-02-15 ENCOUNTER — Ambulatory Visit: Payer: Self-pay | Admitting: Sports Medicine

## 2014-03-01 ENCOUNTER — Encounter: Payer: Self-pay | Admitting: Sports Medicine

## 2014-03-01 ENCOUNTER — Ambulatory Visit (INDEPENDENT_AMBULATORY_CARE_PROVIDER_SITE_OTHER): Payer: 59 | Admitting: Sports Medicine

## 2014-03-01 ENCOUNTER — Telehealth: Payer: Self-pay | Admitting: *Deleted

## 2014-03-01 VITALS — BP 124/85 | HR 70 | Ht 65.0 in | Wt 237.0 lb

## 2014-03-01 DIAGNOSIS — M5136 Other intervertebral disc degeneration, lumbar region: Secondary | ICD-10-CM

## 2014-03-01 DIAGNOSIS — Z23 Encounter for immunization: Secondary | ICD-10-CM

## 2014-03-01 DIAGNOSIS — M5137 Other intervertebral disc degeneration, lumbosacral region: Secondary | ICD-10-CM

## 2014-03-01 DIAGNOSIS — M51379 Other intervertebral disc degeneration, lumbosacral region without mention of lumbar back pain or lower extremity pain: Secondary | ICD-10-CM

## 2014-03-01 DIAGNOSIS — M51369 Other intervertebral disc degeneration, lumbar region without mention of lumbar back pain or lower extremity pain: Secondary | ICD-10-CM

## 2014-03-01 NOTE — Progress Notes (Signed)
  Subjective:    CC: Follow up  HPI: Kelsey Snow comes back to see me, she is a 48 year old female with lumbar degenerative disc disease. Initially she had left-sided radicular symptoms with an MRI showing a large L5-S1 disc protrusion, she did have a lumbar fusion. Unfortunately she continued to have axial pain which is expected, and she tells me it's worse with back extension. She also continues to have numbness radiating down the left leg in an S1 distribution. Pain is moderate, persistent.  Past medical history, Surgical history, Family history not pertinant except as noted below, Social history, Allergies, and medications have been entered into the medical record, reviewed, and no changes needed.   Review of Systems: No fevers, chills, night sweats, weight loss, chest pain, or shortness of breath.   Objective:    General: Well Developed, well nourished, and in no acute distress.  Neuro: Alert and oriented x3, extra-ocular muscles intact, sensation grossly intact.  HEENT: Normocephalic, atraumatic, pupils equal round reactive to light, neck supple, no masses, no lymphadenopathy, thyroid nonpalpable.  Skin: Warm and dry, no rashes. Cardiac: Regular rate and rhythm, no murmurs rubs or gallops, no lower extremity edema.  Respiratory: Clear to auscultation bilaterally. Not using accessory muscles, speaking in full sentences.  I received the report of her old preoperative MRI which did show widespread facet arthrosis as well as a large L5-S1 disc protrusion, as well as an L4-5 disc protrusion with bilateral foraminal stenosis at both levels.  Impression and Recommendations:

## 2014-03-01 NOTE — Assessment & Plan Note (Signed)
Previous MRI did show widespread facets spondylosis and L4-5 as well as L5-S1 degenerative disc disease with bilateral foraminal stenosis. I do not have information exactly which levels operated on, I do suspect it was the L5-S1 level. Unfortunately she continues to have left-sided S1 radicular symptoms. At this point as she did not have any relief after the surgery we will obtain a new MRI with IV contrast, and nerve conduction/EMG. Return to see me to go over results of the MRI. 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 I did explain that it may take several injections to determine the exact pain generator. I have also recommended that she take her gabapentin, she was worried that it would cause seizures, I explained that it is initially marketed as an antiepileptic.

## 2014-03-01 NOTE — Telephone Encounter (Signed)
PA obtained for MRI Lumbar Spine with and w/o contrast. Auth # [id number].  Oscar La, LPN

## 2014-03-02 ENCOUNTER — Encounter: Payer: Self-pay | Admitting: Sports Medicine

## 2014-03-05 ENCOUNTER — Encounter: Payer: Self-pay | Admitting: Sports Medicine

## 2014-03-14 ENCOUNTER — Ambulatory Visit (INDEPENDENT_AMBULATORY_CARE_PROVIDER_SITE_OTHER): Payer: 59

## 2014-03-14 DIAGNOSIS — M5126 Other intervertebral disc displacement, lumbar region: Secondary | ICD-10-CM

## 2014-03-14 DIAGNOSIS — M47817 Spondylosis without myelopathy or radiculopathy, lumbosacral region: Secondary | ICD-10-CM

## 2014-03-14 MED ORDER — GADOBENATE DIMEGLUMINE 529 MG/ML IV SOLN: 20.0000 mL | Freq: Once | INTRAVENOUS | Status: AC | PRN

## 2014-03-16 ENCOUNTER — Ambulatory Visit (INDEPENDENT_AMBULATORY_CARE_PROVIDER_SITE_OTHER): Payer: 59 | Admitting: Sports Medicine

## 2014-03-16 ENCOUNTER — Encounter: Payer: Self-pay | Admitting: Sports Medicine

## 2014-03-16 VITALS — BP 132/87 | HR 70 | Ht 65.0 in | Wt 231.0 lb

## 2014-03-16 DIAGNOSIS — E669 Obesity, unspecified: Secondary | ICD-10-CM

## 2014-03-16 MED ORDER — PHENTERMINE HCL 37.5 MG PO CAPS: 37.5000 mg | ORAL_CAPSULE | ORAL | Status: DC

## 2014-03-16 NOTE — Progress Notes (Signed)
  Subjective:    CC: Weight check  HPI: Obesity: Continued excellent weight loss, 11 pounds since last visit. No side effects.  Past medical history, Surgical history, Family history not pertinant except as noted below, Social history, Allergies, and medications have been entered into the medical record, reviewed, and no changes needed.   Review of Systems: No fevers, chills, night sweats, weight loss, chest pain, or shortness of breath.   Objective:    General: Well Developed, well nourished, and in no acute distress.  Neuro: Alert and oriented x3, extra-ocular muscles intact, sensation grossly intact.  HEENT: Normocephalic, atraumatic, pupils equal round reactive to light, neck supple, no masses, no lymphadenopathy, thyroid nonpalpable.  Skin: Warm and dry, no rashes. Cardiac: Regular rate and rhythm, no murmurs rubs or gallops, no lower extremity edema.  Respiratory: Clear to auscultation bilaterally. Not using accessory muscles, speaking in full sentences.  Impression and Recommendations:

## 2014-03-16 NOTE — Assessment & Plan Note (Signed)
Continued excellent weight loss. Refilling phentermine, return in one month for a week check and refills.

## 2014-03-19 ENCOUNTER — Encounter: Payer: Self-pay | Admitting: Sports Medicine

## 2014-03-20 ENCOUNTER — Ambulatory Visit (INDEPENDENT_AMBULATORY_CARE_PROVIDER_SITE_OTHER): Payer: 59 | Admitting: Neurology

## 2014-03-20 ENCOUNTER — Encounter: Payer: Self-pay | Admitting: Neurology

## 2014-03-20 VITALS — BP 115/75 | HR 67 | Ht 65.0 in | Wt 233.0 lb

## 2014-03-20 DIAGNOSIS — M5136 Other intervertebral disc degeneration, lumbar region: Secondary | ICD-10-CM

## 2014-03-20 DIAGNOSIS — M5137 Other intervertebral disc degeneration, lumbosacral region: Secondary | ICD-10-CM

## 2014-03-20 MED ORDER — MELOXICAM 15 MG PO TABS: 15.0000 mg | ORAL_TABLET | Freq: Every day | ORAL | Status: DC

## 2014-03-20 NOTE — Progress Notes (Signed)
Reason for visit: Back pain  Kelsey Snow is a 48 y.o. female  History of present illness:  [name] is a 48 year old white female with a history of obesity and low back pain. The patient underwent surgical decompression at the L5-S1 level in October 2014. The patient began having symptoms in June of 2014 spontaneously. She began having back pain and pain down the left leg to the foot in the lateral aspect. She developed some weakness of the left leg. The patient underwent surgical decompression in October, and her pain improved. The patient however, has had persistent numbness in the left buttock area and down the left leg into the calf and lateral aspect of the left foot. The strength of the left leg is improved. In February 2015, the patient began having some discomfort across the low back, and into the left hip, and some slight pain into the right side as well. The pain does not go all the way down the leg. The patient is not sleeping well because of the discomfort. The patient has stiffness of the back. She also reports chronic neck pain and some occasional tingling in the hands. The patient denies problems controlling the bowels or the bladder, and she denies any balance issues. She is sent to this office for an evaluation. A repeat MRI of the lumbosacral spine was done, and this was reviewed on line. There appears to be good surgical decompression at the L5/S1 level. There is some question of mild impingement of the left L2 nerve root. There is facet arthritis at L5/S1 level, and at the L4-5 level.  Past Medical History  Diagnosis Date  . Thyroid disease   . Hyperlipidemia   . Obese   . Lumbosacral spondylolysis   . GERD (gastroesophageal reflux disease)   . OCD (obsessive compulsive disorder)   . Hx of ectopic pregnancy     Past Surgical History  Procedure Laterality Date  . Tubal ligation    . Back surgery      Family History  Problem Relation Age of Onset  .  Aneurysm Father   . Depression Mother   . Cancer Mother     Pancreatic  . Thyroid disease Mother     hypothyroidism  . Diabetes Maternal Grandmother   . Alcohol abuse Maternal Uncle   . Drug abuse Sister     Social history:  reports that she has never smoked. She has never used smokeless tobacco. She reports that she drinks alcohol. She reports that she does not use illicit drugs.  Medications:  Current Outpatient Prescriptions on File Prior to Visit  Medication Sig Dispense Refill  . escitalopram (LEXAPRO) 20 MG tablet Take 1 tablet (20 mg total) by mouth daily.  90 tablet  2  . levothyroxine (SYNTHROID, LEVOTHROID) 112 MCG tablet Take 112 mcg by mouth daily before breakfast.      . omeprazole (PRILOSEC) 40 MG capsule Take 1 capsule (40 mg total) by mouth daily.  90 capsule  3  . phentermine 37.5 MG capsule Take 1 capsule (37.5 mg total) by mouth every morning.  30 capsule  0  . rosuvastatin (CRESTOR) 10 MG tablet Take 1 tablet (10 mg total) by mouth daily.  90 tablet  3   No current facility-administered medications on file prior to visit.     No Known Allergies  ROS:  Out of a complete 14 system review of symptoms, the patient complains only of the following symptoms, and all other  reviewed systems are negative.  Fatigue Swelling in the legs Moles Constipation Feeling hot, cold, increased thirst, flushing Joint pain, joint swelling, achy muscles Runny nose Not enough sleep   Blood pressure 115/75, pulse 67, height 5\' 5"  (1.651 m), weight 233 lb (105.688 kg), last menstrual period 02/26/2014.  Physical Exam  General: The patient is alert and cooperative at the time of the examination.The patient is markedly obese.  Eyes: Pupils are equal, round, and reactive to light. Discs are flat bilaterally.  Neck: The neck is supple, no carotid bruits are noted.  Respiratory: The respiratory examination is clear.  Cardiovascular: The cardiovascular examination reveals a  regular rate and rhythm, no obvious murmurs or rubs are noted.  Neuromuscular: Range of movement of the lumbosacral spine is full. Slight tenderness is noted with palpation of the low back, including over the SI joints bilaterally.  Skin: Extremities are without significant edema.  Neurologic Exam  Mental status: The patient is alert and oriented x 3 at the time of the examination. The patient has apparent normal recent and remote memory, with an apparently normal attention span and concentration ability.  Cranial nerves: Facial symmetry is present. There is good sensation of the face to pinprick and soft touch bilaterally. The strength of the facial muscles and the muscles to head turning and shoulder shrug are normal bilaterally. Speech is well enunciated, no aphasia or dysarthria is noted. Extraocular movements are full. Visual fields are full. The tongue is midline, and the patient has symmetric elevation of the soft palate. No obvious hearing deficits are noted.  Motor: The motor testing reveals 5 over 5 strength of all 4 extremities. Good symmetric motor tone is noted throughout.The patient is able to walk on heels and the toes bilaterally.  Sensory: Sensory testing is intact to pinprick, soft touch, vibration sensation, and position sense on all 4 extremities, With exception that there is some decrease in pinprick sensation on the lateral left foot, posterior lower leg. No evidence of extinction is noted.  Coordination: Cerebellar testing reveals good finger-nose-finger and heel-to-shin bilaterally.  Gait and station: Gait is normal. Tandem gait is normal. Romberg is negative. No drift is seen.  Reflexes: Deep tendon reflexes are symmetric and normal bilaterally, with the exception that the left ankle jerk reflexes absent. Toes are downgoing bilaterally.   MRI lumbosacral spine 03/14/2014:  IMPRESSION:  1. Prior discectomy at L5-S1. Minimal broad-based disc bulge at  L5-S1 with mild  bilateral facet arthropathy.  2. At L4-5 there is a mild broad-based disc bulge and mild bilateral  facet arthropathy with bilateral mild lateral recess stenosis.  3. At L2-3 there is a mild broad-based disc bulge with a left far  lateral disc component abutting the left extra foraminal L2 nerve  root.    Assessment/Plan:  One. Chronic low back pain  2. Obesity  The patient has chronic numbness of the left leg that is a likely residual from the left S1 nerve root compression. The patient reports crepitus in the low back that likely is related to the facet joint arthritis in the back, and this may be a chronic issue. The patient likely does not have pain from a nerve root compression syndrome at this point. She will be set up for nerve conduction studies on both legs, and EMG evaluation of both legs. The patient will be placed on Mobic. She will return for the above study. The patient is trying to lose weight, and I believe this ultimately is the way  to treat the low back issues. I have recommended low grade exercise, and I have suggested swimming.   Jill Alexanders MD 03/20/2014 7:31 PM  [facility] [address] [place], [place] [address]  Phone [phone] Fax [phone]

## 2014-03-26 ENCOUNTER — Ambulatory Visit: Payer: 59 | Admitting: Neurology

## 2014-04-05 ENCOUNTER — Encounter (INDEPENDENT_AMBULATORY_CARE_PROVIDER_SITE_OTHER): Payer: 59

## 2014-04-05 ENCOUNTER — Ambulatory Visit (INDEPENDENT_AMBULATORY_CARE_PROVIDER_SITE_OTHER): Payer: 59 | Admitting: Neurology

## 2014-04-05 DIAGNOSIS — G542 Cervical root disorders, not elsewhere classified: Secondary | ICD-10-CM

## 2014-04-05 DIAGNOSIS — Z0289 Encounter for other administrative examinations: Secondary | ICD-10-CM

## 2014-04-05 DIAGNOSIS — G544 Lumbosacral root disorders, not elsewhere classified: Secondary | ICD-10-CM

## 2014-04-05 DIAGNOSIS — M5136 Other intervertebral disc degeneration, lumbar region: Secondary | ICD-10-CM

## 2014-04-05 NOTE — Procedures (Signed)
HISTORY:  Kelsey Snow is a 48 year old patient with a history of prior lumbosacral spine surgery with a left S1 radiculopathy. She has significant obesity, and she reports ongoing back pain and some discomfort down the legs posteriorly, left greater than right. She also reports numbness in the hands, and neck discomfort and cervicogenic headache. She is getting eye with for possible neuropathy or a lumbosacral radiculopathy.  NERVE CONDUCTION STUDIES:  Nerve conduction studies were performed on both upper extremities. The distal motor latencies and motor amplitudes for the median and ulnar nerves were within normal limits. The F wave latencies and nerve conduction velocities for these nerves were also normal. The sensory latencies for the median and ulnar nerves were normal.  Nerve conduction studies were performed on both lower extremities. The distal motor latencies and motor amplitudes for the peroneal and posterior tibial nerves were within normal limits. The nerve conduction velocities for these nerves were also normal. The H reflex latencies were normal. The sensory latencies for the peroneal nerves were within normal limits.   EMG STUDIES:  EMG study was performed on the right lower extremity:  The tibialis anterior muscle reveals 2 to 4K motor units with full recruitment. No fibrillations or positive waves were seen. The peroneus tertius muscle reveals 2 to 4K motor units with full recruitment. No fibrillations or positive waves were seen. The medial gastrocnemius muscle reveals 1 to 3K motor units with full recruitment. No fibrillations or positive waves were seen. The vastus lateralis muscle reveals 2 to 4K motor units with full recruitment. No fibrillations or positive waves were seen. The iliopsoas muscle reveals 2 to 4K motor units with full recruitment. No fibrillations or positive waves were seen. The biceps femoris muscle (long head) reveals 2 to 4K motor units with full  recruitment. No fibrillations or positive waves were seen. The lumbosacral paraspinal muscles were tested at 3 levels, and revealed no abnormalities of insertional activity at all 3 levels tested. There was good relaxation.  EMG study was performed on the left lower extremity:  The tibialis anterior muscle reveals 2 to 4K motor units with full recruitment. No fibrillations or positive waves were seen. The peroneus tertius muscle reveals 2 to 5K motor units with decreased recruitment. No fibrillations or positive waves were seen. The medial gastrocnemius muscle reveals up to 5K motor units with decreased recruitment. 2+ fibrillations and positive waves were seen. The vastus lateralis muscle reveals 2 to 4K motor units with full recruitment. No fibrillations or positive waves were seen. The iliopsoas muscle reveals 2 to 4K motor units with full recruitment. No fibrillations or positive waves were seen. The biceps femoris muscle (long head) reveals 2 to 4K motor units with full recruitment. No fibrillations or positive waves were seen. The lumbosacral paraspinal muscles were tested at 3 levels, and revealed no abnormalities of insertional activity at all 3 levels tested, with the exception of one isolated run of possible waves seen at the middle level. There was good relaxation.   IMPRESSION:  Nerve conduction studies done on both lower extremities were within normal limits. EMG evaluation of the right lower extremity was unremarkable, without evidence of an overlying lumbosacral radiculopathy. EMG of the left lower extremity shows evidence of acute and chronic denervation involving the S1 nerve root. The lumbosacral paraspinal muscles on the left were minimally involved, suggesting that the findings by EMG represent a healing phase of the prior compressive S1 radiculopathy.  Jill Alexanders MD 04/05/2014 1:53 PM  [facility] [address] [place], [place]  [address]  Phone [phone] Fax [phone]

## 2014-04-05 NOTE — Progress Notes (Signed)
Kelsey Snow is a 48 year old patient with a history of significant obesity who reports neck pain, and numbness bilaterally, low back pain. The patient has had a prior left S1 nerve root decompression, and she continues to have pain down both legs, left greater than right. The patient is being evaluated for this issue.  Nerve conduction studies done on all 4 extremities were unremarkable, without evidence of a peripheral neuropathy. No carpal tunnel syndrome is seen. EMG study on the right leg is normal, EMG on the left leg shows acute and chronic changes consistent with an S1 radiculopathy, but the lumbosacral paraspinal muscles were relatively uninvolved suggesting a healing process. Surgery was done about 8 months ago.  The patient has had a repeat MRI of the lumbosacral spine recently that did not show compression of the S1 nerve root on the left. The patient will be placed on Cymbalta, she will followup in about 3-4 months. She desperately needs to lose weight, engage in neuromuscular therapy.

## 2014-04-06 ENCOUNTER — Encounter: Payer: Self-pay | Admitting: Sports Medicine

## 2014-04-09 ENCOUNTER — Telehealth: Payer: Self-pay | Admitting: Neurology

## 2014-04-09 NOTE — Telephone Encounter (Signed)
Patient calling and checking status of Cymbalta Rx.  Also wanted to know if further testing was needed for viewing area of past surgery?  Please call and advise

## 2014-04-10 MED ORDER — DULOXETINE HCL 30 MG PO CPEP: ORAL_CAPSULE | ORAL | Status: DC

## 2014-04-10 NOTE — Telephone Encounter (Signed)
I called patient. The patient will be placed on Cymbalta. We have talked about doing another MRI of the low back, but as it turns out one was just recently done showing a compression of the S1 nerve root. The findings by EMG just a healing phase of the S1 nerve root compression. The patient will contact me if she has problems tolerating the Cymbalta.

## 2014-04-10 NOTE — Telephone Encounter (Signed)
Spoke with patient and she would like to know about the status of Cymbalta prescription and that Dr Jannifer Franklin had mentioned in last OV and he had mentioned testing past surgery area

## 2014-04-13 ENCOUNTER — Encounter: Payer: Self-pay | Admitting: Sports Medicine

## 2014-04-13 ENCOUNTER — Ambulatory Visit (INDEPENDENT_AMBULATORY_CARE_PROVIDER_SITE_OTHER): Payer: 59 | Admitting: Sports Medicine

## 2014-04-13 VITALS — BP 115/75 | HR 65 | Ht 65.0 in | Wt 225.0 lb

## 2014-04-13 DIAGNOSIS — M5137 Other intervertebral disc degeneration, lumbosacral region: Secondary | ICD-10-CM

## 2014-04-13 DIAGNOSIS — M5030 Other cervical disc degeneration, unspecified cervical region: Secondary | ICD-10-CM

## 2014-04-13 DIAGNOSIS — E669 Obesity, unspecified: Secondary | ICD-10-CM

## 2014-04-13 DIAGNOSIS — M51379 Other intervertebral disc degeneration, lumbosacral region without mention of lumbar back pain or lower extremity pain: Secondary | ICD-10-CM

## 2014-04-13 DIAGNOSIS — M47812 Spondylosis without myelopathy or radiculopathy, cervical region: Secondary | ICD-10-CM | POA: Insufficient documentation

## 2014-04-13 DIAGNOSIS — M51369 Other intervertebral disc degeneration, lumbar region without mention of lumbar back pain or lower extremity pain: Secondary | ICD-10-CM

## 2014-04-13 DIAGNOSIS — M5136 Other intervertebral disc degeneration, lumbar region: Secondary | ICD-10-CM

## 2014-04-13 MED ORDER — PHENTERMINE HCL 37.5 MG PO CAPS: 37.5000 mg | ORAL_CAPSULE | ORAL | Status: DC

## 2014-04-13 NOTE — Progress Notes (Signed)
  Subjective:    CC: Follow up  HPI: Obesity: Good weight loss.  Lumbar radiculopathy : EMG and nerve conduction showed left-sided healing S1 radiculopathy. Symptoms are moderate, persistent, last epidural was in the end of last year.  Past medical history, Surgical history, Family history not pertinant except as noted below, Social history, Allergies, and medications have been entered into the medical record, reviewed, and no changes needed.   Review of Systems: No fevers, chills, night sweats, weight loss, chest pain, or shortness of breath.   Objective:    General: Well Developed, well nourished, and in no acute distress.  Neuro: Alert and oriented x3, extra-ocular muscles intact, sensation grossly intact.  HEENT: Normocephalic, atraumatic, pupils equal round reactive to light, neck supple, no masses, no lymphadenopathy, thyroid nonpalpable.  Skin: Warm and dry, no rashes. Cardiac: Regular rate and rhythm, no murmurs rubs or gallops, no lower extremity edema.  Respiratory: Clear to auscultation bilaterally. Not using accessory muscles, speaking in full sentences.  Impression and Recommendations:

## 2014-04-13 NOTE — Assessment & Plan Note (Addendum)
Symptoms are likely related to cervical spinal stenosis. She has already failed 6 weeks of physician directed rehabilitation, instead, steroids C-spine MRI. Return to see me to go over results.

## 2014-04-13 NOTE — Assessment & Plan Note (Signed)
Refilling phentermine. Return in one month for weight and refills

## 2014-04-13 NOTE — Assessment & Plan Note (Signed)
Post left-sided L5-S1 laminectomy and microdiscectomy. With persistent left-sided S1 radicular symptoms. EMG/nerve conduction shows healing phase left-sided S1 radiculopathy. I do recommend a repeat left-sided L5-S1 intralaminar epidural injection, she does have a  who does these. Continue Cymbalta prescribed by neurologist.

## 2014-04-16 ENCOUNTER — Encounter: Payer: Self-pay | Admitting: Sports Medicine

## 2014-04-16 ENCOUNTER — Ambulatory Visit: Payer: 59 | Admitting: Sports Medicine

## 2014-04-16 ENCOUNTER — Telehealth: Payer: Self-pay | Admitting: *Deleted

## 2014-04-16 NOTE — Telephone Encounter (Signed)
Prior auth acquired for MRI cervical spine from [employer] [facility] [employer]. [phone] expires 05/31/14. Margette Fast, CMA

## 2014-04-17 ENCOUNTER — Encounter: Payer: Self-pay | Admitting: Sports Medicine

## 2014-04-25 ENCOUNTER — Ambulatory Visit (INDEPENDENT_AMBULATORY_CARE_PROVIDER_SITE_OTHER): Payer: 59

## 2014-04-25 ENCOUNTER — Telehealth: Payer: Self-pay

## 2014-04-25 ENCOUNTER — Telehealth: Payer: Self-pay | Admitting: *Deleted

## 2014-04-25 DIAGNOSIS — M5030 Other cervical disc degeneration, unspecified cervical region: Secondary | ICD-10-CM

## 2014-04-25 DIAGNOSIS — I7771 Dissection of carotid artery: Secondary | ICD-10-CM

## 2014-04-25 DIAGNOSIS — I720 Aneurysm of carotid artery: Secondary | ICD-10-CM

## 2014-04-25 DIAGNOSIS — M47812 Spondylosis without myelopathy or radiculopathy, cervical region: Secondary | ICD-10-CM

## 2014-04-25 DIAGNOSIS — M5020 Other cervical disc displacement, unspecified cervical region: Secondary | ICD-10-CM

## 2014-04-25 MED ORDER — IOHEXOL 350 MG/ML SOLN
80.0000 mL | Freq: Once | INTRAVENOUS | Status: AC | PRN
  Administered 2014-04-25: 80 mL via INTRAVENOUS

## 2014-04-25 NOTE — Telephone Encounter (Signed)
CT approval for [id number] good thru 06/09/14 - [id number]. Margette Fast, CMA

## 2014-04-25 NOTE — Telephone Encounter (Signed)
VERY IMPORTANT: The radiologist would like for provider to  look at impression 1 on the radiology report.  ,CMA

## 2014-04-25 NOTE — Telephone Encounter (Signed)
Per Dr. Darene Lamer  STAT CT angiogram of neck was ordered, advised Imaging that order was placed. I contacted the patient and advised her to go back and get CT done she stated that she would come back in 30 mins and have it done. I called Bonnita Nasuti in Imaging to let her know. Rhonda Cunningham,CMA

## 2014-04-26 ENCOUNTER — Encounter: Payer: Self-pay | Admitting: Sports Medicine

## 2014-04-26 DIAGNOSIS — I7771 Dissection of carotid artery: Secondary | ICD-10-CM | POA: Insufficient documentation

## 2014-04-27 ENCOUNTER — Encounter: Payer: Self-pay | Admitting: Sports Medicine

## 2014-04-29 ENCOUNTER — Encounter: Payer: Self-pay | Admitting: Sports Medicine

## 2014-05-01 ENCOUNTER — Encounter: Payer: Self-pay | Admitting: Sports Medicine

## 2014-05-02 ENCOUNTER — Other Ambulatory Visit: Payer: Self-pay

## 2014-05-02 MED ORDER — ROSUVASTATIN CALCIUM 10 MG PO TABS: 10.0000 mg | ORAL_TABLET | Freq: Every day | ORAL | Status: DC

## 2014-05-03 DIAGNOSIS — M4802 Spinal stenosis, cervical region: Secondary | ICD-10-CM | POA: Insufficient documentation

## 2014-05-03 DIAGNOSIS — M545 Low back pain, unspecified: Secondary | ICD-10-CM | POA: Insufficient documentation

## 2014-05-11 ENCOUNTER — Encounter: Payer: Self-pay | Admitting: Sports Medicine

## 2014-05-11 ENCOUNTER — Ambulatory Visit (INDEPENDENT_AMBULATORY_CARE_PROVIDER_SITE_OTHER): Payer: 59 | Admitting: Sports Medicine

## 2014-05-11 VITALS — BP 127/67 | HR 73 | Ht 65.0 in | Wt 223.0 lb

## 2014-05-11 DIAGNOSIS — F429 Obsessive-compulsive disorder, unspecified: Secondary | ICD-10-CM

## 2014-05-11 DIAGNOSIS — I720 Aneurysm of carotid artery: Secondary | ICD-10-CM

## 2014-05-11 DIAGNOSIS — M5136 Other intervertebral disc degeneration, lumbar region: Secondary | ICD-10-CM

## 2014-05-11 DIAGNOSIS — M5137 Other intervertebral disc degeneration, lumbosacral region: Secondary | ICD-10-CM

## 2014-05-11 DIAGNOSIS — M51369 Other intervertebral disc degeneration, lumbar region without mention of lumbar back pain or lower extremity pain: Secondary | ICD-10-CM

## 2014-05-11 DIAGNOSIS — M5030 Other cervical disc degeneration, unspecified cervical region: Secondary | ICD-10-CM

## 2014-05-11 DIAGNOSIS — M51379 Other intervertebral disc degeneration, lumbosacral region without mention of lumbar back pain or lower extremity pain: Secondary | ICD-10-CM

## 2014-05-11 DIAGNOSIS — E669 Obesity, unspecified: Secondary | ICD-10-CM

## 2014-05-11 MED ORDER — ASPIRIN EC 81 MG PO TBEC
81.0000 mg | DELAYED_RELEASE_TABLET | Freq: Every day | ORAL | Status: DC
Start: 2014-05-11 — End: 2014-05-24

## 2014-05-11 MED ORDER — DULOXETINE HCL 60 MG PO CPEP: 120.0000 mg | ORAL_CAPSULE | Freq: Every day | ORAL | Status: DC

## 2014-05-11 MED ORDER — PHENTERMINE HCL 37.5 MG PO TABS: 18.7500 mg | ORAL_TABLET | Freq: Every day | ORAL | Status: DC

## 2014-05-11 NOTE — Assessment & Plan Note (Signed)
50 pounds total weight loss in 3 months. Switching to one half tab phentermine daily. 3 months given.

## 2014-05-11 NOTE — Assessment & Plan Note (Signed)
Does have an appointment coming up with vascular surgery. This is not atherosclerosis I do recommend starting an aspirin.

## 2014-05-11 NOTE — Progress Notes (Signed)
  Subjective:    CC: Followup  HPI: Obesity: Has lost a total of 50 pounds now on phentermine.  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 She is completely asymptomatic. Not yet taking a baby aspirin.  Cervical and lumbar degenerative disc disease: Does have an appointment coming up with her spine interventionalist  Past medical history, Surgical history, Family history not pertinant except as noted below, Social history, Allergies, and medications have been entered into the medical record, reviewed, and no changes needed.   Review of Systems: No fevers, chills, night sweats, weight loss, chest pain, or shortness of breath.   Objective:    General: Well Developed, well nourished, and in no acute distress.  Neuro: Alert and oriented x3, extra-ocular muscles intact, sensation grossly intact.  HEENT: Normocephalic, atraumatic, pupils equal round reactive to light, neck supple, no masses, no lymphadenopathy, thyroid nonpalpable.  Skin: Warm and dry, no rashes. Cardiac: Regular rate and rhythm, no murmurs rubs or gallops, no lower extremity edema.  Respiratory: Clear to auscultation bilaterally. Not using accessory muscles, speaking in full sentences.  Impression and Recommendations:

## 2014-05-11 NOTE — Assessment & Plan Note (Signed)
I have advised that she seek any repeat epidural from Dr. Katherine Roan. Also increasing Cymbalta.

## 2014-05-11 NOTE — Assessment & Plan Note (Signed)
Avoid chiropractic care and HVLA treatments. Continue with Dr. Katherine Roan for further treatment.

## 2014-05-11 NOTE — Assessment & Plan Note (Signed)
Increasing Cymbalta.

## 2014-05-17 ENCOUNTER — Encounter: Payer: Self-pay | Admitting: Vascular Surgery

## 2014-05-18 ENCOUNTER — Ambulatory Visit (INDEPENDENT_AMBULATORY_CARE_PROVIDER_SITE_OTHER): Payer: 59 | Admitting: Vascular Surgery

## 2014-05-18 ENCOUNTER — Encounter: Payer: Self-pay | Admitting: Vascular Surgery

## 2014-05-18 VITALS — BP 130/72 | HR 66 | Ht 65.0 in | Wt 220.0 lb

## 2014-05-18 DIAGNOSIS — I720 Aneurysm of carotid artery: Secondary | ICD-10-CM

## 2014-05-18 DIAGNOSIS — I7771 Dissection of carotid artery: Secondary | ICD-10-CM
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
## 2014-05-23 ENCOUNTER — Encounter: Payer: Self-pay | Admitting: Sports Medicine

## 2014-05-23 DIAGNOSIS — Z0279 Encounter for issue of other medical certificate: Secondary | ICD-10-CM

## 2014-05-24 MED ORDER — CLOPIDOGREL BISULFATE 75 MG PO TABS: 75.0000 mg | ORAL_TABLET | Freq: Every day | ORAL | Status: DC

## 2014-05-25 ENCOUNTER — Encounter: Payer: Self-pay | Admitting: Sports Medicine

## 2014-05-25 ENCOUNTER — Encounter: Payer: Self-pay | Admitting: Vascular Surgery

## 2014-06-08 ENCOUNTER — Encounter: Payer: Self-pay | Admitting: Sports Medicine

## 2014-06-11 ENCOUNTER — Other Ambulatory Visit: Payer: Self-pay

## 2014-06-11 MED ORDER — LEVOTHYROXINE SODIUM 112 MCG PO TABS: 112.0000 ug | ORAL_TABLET | Freq: Every day | ORAL | Status: DC

## 2014-06-11 NOTE — Telephone Encounter (Signed)
Patient request refill for Levothyroxine 112 mcg. #30 with 3 R was sent to [facility].  ,CMA

## 2014-06-23 ENCOUNTER — Encounter: Payer: Self-pay | Admitting: Sports Medicine

## 2014-06-25 ENCOUNTER — Encounter: Payer: Self-pay | Admitting: Sports Medicine

## 2014-06-26 MED ORDER — TRAMADOL HCL 50 MG PO TABS: 100.0000 mg | ORAL_TABLET | Freq: Three times a day (TID) | ORAL | Status: DC | PRN

## 2014-06-27 ENCOUNTER — Encounter: Payer: Self-pay | Admitting: Sports Medicine

## 2014-07-17 ENCOUNTER — Encounter: Payer: Self-pay | Admitting: Sports Medicine

## 2014-07-27 ENCOUNTER — Encounter: Payer: Self-pay | Admitting: Sports Medicine

## 2014-07-27 ENCOUNTER — Ambulatory Visit (INDEPENDENT_AMBULATORY_CARE_PROVIDER_SITE_OTHER): Payer: 59 | Admitting: Sports Medicine

## 2014-07-27 VITALS — BP 139/87 | HR 72 | Ht 65.0 in | Wt 222.0 lb

## 2014-07-27 DIAGNOSIS — Z0000 Encounter for general adult medical examination without abnormal findings: Secondary | ICD-10-CM

## 2014-07-27 DIAGNOSIS — I7771 Dissection of carotid artery: Secondary | ICD-10-CM

## 2014-07-27 DIAGNOSIS — Z23 Encounter for immunization: Secondary | ICD-10-CM

## 2014-07-27 NOTE — Progress Notes (Signed)
  Subjective:    CC: Complete physical  HPI:  48 year old female is doing well, up-to-date on screening measures, here for a physical.  Past medical history, Surgical history, Family history not pertinant except as noted below, Social history, Allergies, and medications have been entered into the medical record, reviewed, and no changes needed.   Review of Systems: No headache, visual changes, nausea, vomiting, diarrhea, constipation, dizziness, abdominal pain, skin rash, fevers, chills, night sweats, swollen lymph nodes, weight loss, chest pain, body aches, joint swelling, muscle aches, shortness of breath, mood changes, visual or auditory hallucinations.  Objective:    General: Well Developed, well nourished, and in no acute distress.  Neuro: Alert and oriented x3, extra-ocular muscles intact, sensation grossly intact. Cranial nerves II through XII are intact, motor, sensory, and coordinative functions are all intact. HEENT: Normocephalic, atraumatic, pupils equal round reactive to light, neck supple, no masses, no lymphadenopathy, thyroid nonpalpable. Oropharynx, nasopharynx, external ear canals are unremarkable. Skin: Warm and dry, no rashes noted.  Cardiac: Regular rate and rhythm, no murmurs rubs or gallops.  Respiratory: Clear to auscultation bilaterally. Not using accessory muscles, speaking in full sentences.  Abdominal: Soft, nontender, nondistended, positive bowel sounds, no masses, no organomegaly.  Musculoskeletal: Shoulder, elbow, wrist, hip, knee, ankle stable, and with full range of motion.  Impression and Recommendations:    The patient was counselled, risk factors were discussed, anticipatory guidance given.

## 2014-07-27 NOTE — Assessment & Plan Note (Signed)
Stable on Plavix, she does have followup with vascular surgery.

## 2014-07-27 NOTE — Assessment & Plan Note (Signed)
Physical exam today. Lipid panel is up-to-date and well controlled. Cervical cancer screening is up-to-date. Return as needed.

## 2014-07-30 ENCOUNTER — Encounter: Payer: Self-pay | Admitting: Sports Medicine

## 2014-08-10 ENCOUNTER — Ambulatory Visit: Payer: Self-pay | Admitting: Sports Medicine

## 2014-08-28 ENCOUNTER — Encounter: Payer: Self-pay | Admitting: Sports Medicine

## 2014-08-31 ENCOUNTER — Other Ambulatory Visit: Payer: Self-pay | Admitting: Sports Medicine

## 2014-08-31 MED ORDER — DULOXETINE HCL 60 MG PO CPEP: 60.0000 mg | ORAL_CAPSULE | Freq: Every day | ORAL | Status: DC

## 2014-09-03 ENCOUNTER — Encounter: Payer: Self-pay | Admitting: Sports Medicine

## 2014-09-04 ENCOUNTER — Other Ambulatory Visit: Payer: Self-pay | Admitting: Sports Medicine

## 2014-09-11 ENCOUNTER — Other Ambulatory Visit: Payer: Self-pay | Admitting: Sports Medicine

## 2014-09-11 MED ORDER — TRAMADOL HCL 50 MG PO TABS: 100.0000 mg | ORAL_TABLET | Freq: Three times a day (TID) | ORAL | Status: DC | PRN

## 2014-10-16 ENCOUNTER — Other Ambulatory Visit: Payer: Self-pay | Admitting: Sports Medicine

## 2014-10-19 ENCOUNTER — Other Ambulatory Visit: Payer: Self-pay | Admitting: Sports Medicine

## 2014-10-22 ENCOUNTER — Encounter: Payer: Self-pay | Admitting: Sports Medicine

## 2014-10-22 MED ORDER — TRAMADOL HCL 50 MG PO TABS: 100.0000 mg | ORAL_TABLET | Freq: Three times a day (TID) | ORAL | Status: DC | PRN

## 2014-11-09 ENCOUNTER — Encounter: Payer: Self-pay | Admitting: Sports Medicine

## 2014-11-09 ENCOUNTER — Other Ambulatory Visit: Payer: Self-pay | Admitting: Sports Medicine

## 2014-11-11 ENCOUNTER — Other Ambulatory Visit: Payer: Self-pay | Admitting: Sports Medicine

## 2014-11-14 ENCOUNTER — Encounter: Payer: Self-pay | Admitting: Sports Medicine

## 2014-11-14 ENCOUNTER — Other Ambulatory Visit: Payer: Self-pay | Admitting: Sports Medicine

## 2014-11-19 ENCOUNTER — Other Ambulatory Visit: Payer: Self-pay | Admitting: Sports Medicine

## 2014-11-20 ENCOUNTER — Other Ambulatory Visit: Payer: Self-pay | Admitting: Sports Medicine

## 2014-11-21 ENCOUNTER — Other Ambulatory Visit: Payer: Self-pay

## 2014-11-21 ENCOUNTER — Encounter: Payer: Self-pay | Admitting: Vascular Surgery

## 2014-11-21 MED ORDER — TRAMADOL HCL 50 MG PO TABS: 100.0000 mg | ORAL_TABLET | Freq: Three times a day (TID) | ORAL | Status: DC | PRN

## 2014-11-23 ENCOUNTER — Ambulatory Visit (INDEPENDENT_AMBULATORY_CARE_PROVIDER_SITE_OTHER): Payer: 59 | Admitting: Vascular Surgery

## 2014-11-23 ENCOUNTER — Ambulatory Visit
Admission: RE | Admit: 2014-11-23 | Discharge: 2014-11-23 | Disposition: A | Discharge status: Home / Self Care | Payer: 59 | Source: Ambulatory Visit | Attending: Vascular Surgery | Admitting: Vascular Surgery

## 2014-11-23 ENCOUNTER — Telehealth: Payer: Self-pay | Admitting: Sports Medicine

## 2014-11-23 ENCOUNTER — Encounter: Payer: Self-pay | Admitting: Vascular Surgery

## 2014-11-23 VITALS — BP 141/83 | HR 67 | Ht 65.0 in | Wt 245.0 lb

## 2014-11-23 DIAGNOSIS — I7771 Dissection of carotid artery: Secondary | ICD-10-CM

## 2014-11-23 DIAGNOSIS — I720 Aneurysm of carotid artery: Secondary | ICD-10-CM

## 2014-11-23 MED ORDER — IOHEXOL 350 MG/ML SOLN
75.0000 mL | Freq: Once | INTRAVENOUS | Status: AC | PRN
  Administered 2014-11-23: 75 mL via INTRAVENOUS

## 2014-11-23 NOTE — Addendum Note (Signed)
Addended by: Mena Goes on: 11/23/2014 03:29 PM   Modules accepted: Orders

## 2014-11-23 NOTE — Progress Notes (Signed)
    Established Carotid Patient  History of Present Illness  Kelsey Snow is a 49 y.o. (25-Apr-1966) female who presents with chief complaint:follow up left ICA dissection. Previous carotid studies demonstrated. The lumen is maintained at 2.3 mm on CT scan 04/25/2014. Patient has no history of TIA or stroke symptom.  The patient has never had amaurosis fugax or monocular blindness.  The patient has never had facial drooping or hemiplegia.  The patient has never had receptive or expressive aphasia.  She has continued to take daily 81 mg Asprin and Plavix 75 mg.   The patient's PMH, PSH, SH, FamHx, Med, and Allergies are unchanged from 05/18/14.  On ROS today: no fever, chills, changes in speech, weakness or vision changes  Physical Examination  Filed Vitals:   11/23/14 1315 11/23/14 1320  BP: 144/88 141/83  Pulse: 67   Height: 5\' 5"  (1.651 m)   Weight: 245 lb (111.131 kg)   SpO2: 100%    Body mass index is 40.77 kg/(m^2).  General: A&O x 3, WD   Eyes: PERRLA,  EOMI  Neck: Supple  Pulmonary: Sym exp, good air movt, CTAB, no rales, rhonchi, & wheezing   Cardiac: RRR, Nl S1, S2, no Murmurs, rubs or gallops  Vascular: Vessel Right Left  Radial Palpable Palpable  Brachial Palpable Palpable  Carotid Palpable, without bruit Palpable, without bruit  Aorta Not palpable N/A  Femoral Palpable Palpable  Popliteal Not palpable Not palpable  PT Palpable Palpable  DP Palpable Palpable   Gastrointestinal: soft, NTND  Musculoskeletal: M/S 5/5 throughout , Extremities without ischemic changes   Neurologic: CN 2-12 intact, Pain and light touch intact in extremities, Motor exam as listed above  Non-Invasive Vascular Imaging  CTA 04/25/2014: 1. Decrease caliber of the left internal carotid artery from the carotid bifurcation to the skullbase with surrounding soft tissue suggesting a chronic dissection. The lumen is maintained at 2.3 mm.  2. No significant right-sided carotid  disease.  3. The vertebral arteries are normal  CTA 11/23/2014: Left carotid system: The common carotid artery is normal. Previously noted diffuse mild narrowing of the ICA persists, but a previously noted intramural hematoma has resolved. There is no pseudoaneurysm. No flow limiting stenosis.  Medical Decision Making  Kelsey Snow is a 49 y.o. female who presents with: asx L ICA dissection without any associated aneurysm or pseudoaneurysm.  Her exam today is asymptomatic no sign of TIA or stroke.  The CTA shows almost full healing of the dissection and resolve of the hematoma.  At this point she will follow up for a carotid duplex yearly.  She can discontinue the Plavix at this time as well and continue on a daily 81 mg Asprin.      Theda Sers  [facility] PA-C [facility] Office: [phone]   11/23/2014, 1:46 PM   Addendum  I have independently interviewed and examined the patient, and I agree with the physician assistant's findings.  Pt's prior L carotid dissection is nearly completely healed.  After one year, I don't think the plavix adds anything, so she can probably continue with just the baby aspirin.  I would continue with annual B carotid duplex to monitor her carotids for aneurysm degeneration.  There were some suggestions of possible fibromuscular dysplasia on the right side also which can degenerate into aneurysm or develop stenotic disease.  Adele Barthel, MD [facility] Office: [phone] Pager: [phone]  11/23/2014, 2:39 PM

## 2014-11-23 NOTE — Telephone Encounter (Signed)
Per visit with vascular surgery we can discontinue Plavix, please let patient know to stop this medication, continue aspirin. Carotid dissection is still present but intramural thrombus has resolved suggesting healing.

## 2014-11-26 NOTE — Telephone Encounter (Signed)
Left detailed message on patient vm to stop taking the plavix, also asked patient to call me back to make sure she understands the message that I left. Rhonda Cunningham,CMA

## 2014-11-29 ENCOUNTER — Ambulatory Visit (INDEPENDENT_AMBULATORY_CARE_PROVIDER_SITE_OTHER): Payer: 59 | Admitting: Sports Medicine

## 2014-11-29 ENCOUNTER — Encounter: Payer: Self-pay | Admitting: Sports Medicine

## 2014-11-29 VITALS — BP 137/68 | HR 91 | Ht 65.0 in | Wt 249.0 lb

## 2014-11-29 DIAGNOSIS — G5601 Carpal tunnel syndrome, right upper limb: Secondary | ICD-10-CM

## 2014-11-29 DIAGNOSIS — I7771 Dissection of carotid artery: Secondary | ICD-10-CM

## 2014-11-29 DIAGNOSIS — M5030 Other cervical disc degeneration, unspecified cervical region: Secondary | ICD-10-CM

## 2014-11-29 DIAGNOSIS — F429 Obsessive-compulsive disorder, unspecified: Secondary | ICD-10-CM

## 2014-11-29 DIAGNOSIS — F42 Obsessive-compulsive disorder: Secondary | ICD-10-CM

## 2014-11-29 DIAGNOSIS — G5603 Carpal tunnel syndrome, bilateral upper limbs: Secondary | ICD-10-CM | POA: Insufficient documentation

## 2014-11-29 MED ORDER — TRAMADOL HCL (ER BIPHASIC) 300 MG PO CP24: 1.0000 | ORAL_CAPSULE | Freq: Every day | ORAL | Status: DC

## 2014-11-29 MED ORDER — TRAZODONE HCL 50 MG PO TABS: ORAL_TABLET | ORAL | Status: DC

## 2014-11-29 NOTE — Assessment & Plan Note (Signed)
Nighttime splinting. Return in 2 months, ultrasound guided hydrodissection if no better.

## 2014-11-29 NOTE — Addendum Note (Signed)
Addended by: Doree Albee on: 11/29/2014 03:56 PM   Modules accepted: Orders, Medications

## 2014-11-29 NOTE — Assessment & Plan Note (Signed)
This appears to have resolved on recent imaging, we are discontinuing Plavix.

## 2014-11-29 NOTE — Progress Notes (Signed)
  Subjective:    CC: Follow-up  HPI: Cervical degenerative disc disease: Continues to have mild periscapular radiculopathy, she is currently seeing interventional pain management, and will bring up her symptoms to them, she does likely need a cervical epidural. Currently taking 6 immediate release tramadol per day, she is amenable to convert to an extended release formulation.  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  Insomnia: Overall doing well with Lexapro, did not have a good response to Cymbalta, she is amenable to trying trazodone at bedtime.  Past medical history, Surgical history, Family history not pertinant except as noted below, Social history, Allergies, and medications have been entered into the medical record, reviewed, and no changes needed.   Review of Systems: No fevers, chills, night sweats, weight loss, chest pain, or shortness of breath.   Objective:    General: Well Developed, well nourished, and in no acute distress.  Neuro: Alert and oriented x3, extra-ocular muscles intact, sensation grossly intact.  HEENT: Normocephalic, atraumatic, pupils equal round reactive to light, neck supple, no masses, no lymphadenopathy, thyroid nonpalpable.  Skin: Warm and dry, no rashes. Cardiac: Regular rate and rhythm, no murmurs rubs or gallops, no lower extremity edema.  Respiratory: Clear to auscultation bilaterally. Not using accessory muscles, speaking in full sentences.  Impression and Recommendations:

## 2014-11-29 NOTE — Assessment & Plan Note (Signed)
Having some insomnia, I am going to add low-dose trazodone at bedtime.

## 2014-11-29 NOTE — Assessment & Plan Note (Signed)
Also having some periscapular radicular symptoms, this is likely related to her cervical degenerative disc disease, she will continue treatment here with pain management. I am going to add tramadol extended release.

## 2014-12-03 ENCOUNTER — Encounter: Payer: Self-pay | Admitting: Sports Medicine

## 2014-12-04 ENCOUNTER — Telehealth: Payer: Self-pay

## 2014-12-04 NOTE — Telephone Encounter (Signed)
PATIENT WANTED VITAMINS UPDATED IN [place]

## 2014-12-09 ENCOUNTER — Other Ambulatory Visit: Payer: Self-pay | Admitting: Sports Medicine

## 2014-12-11 ENCOUNTER — Other Ambulatory Visit: Payer: Self-pay

## 2014-12-11 ENCOUNTER — Encounter: Payer: Self-pay | Admitting: Sports Medicine

## 2014-12-11 MED ORDER — ESCITALOPRAM OXALATE 20 MG PO TABS: 20.0000 mg | ORAL_TABLET | Freq: Every day | ORAL | Status: DC

## 2014-12-11 MED ORDER — ROSUVASTATIN CALCIUM 10 MG PO TABS: 10.0000 mg | ORAL_TABLET | Freq: Every day | ORAL | Status: DC

## 2014-12-26 ENCOUNTER — Other Ambulatory Visit: Payer: Self-pay | Admitting: Sports Medicine

## 2014-12-26 ENCOUNTER — Encounter: Payer: Self-pay | Admitting: Sports Medicine

## 2014-12-26 DIAGNOSIS — M5030 Other cervical disc degeneration, unspecified cervical region: Secondary | ICD-10-CM

## 2014-12-26 DIAGNOSIS — F429 Obsessive-compulsive disorder, unspecified: Secondary | ICD-10-CM

## 2014-12-26 MED ORDER — TRAZODONE HCL 50 MG PO TABS: ORAL_TABLET | ORAL | Status: DC

## 2014-12-27 MED ORDER — TRAMADOL HCL (ER BIPHASIC) 300 MG PO CP24: 1.0000 | ORAL_CAPSULE | Freq: Every day | ORAL | Status: DC

## 2014-12-30 ENCOUNTER — Other Ambulatory Visit: Payer: Self-pay | Admitting: Sports Medicine

## 2014-12-31 DIAGNOSIS — Z0279 Encounter for issue of other medical certificate: Secondary | ICD-10-CM

## 2015-01-02 ENCOUNTER — Encounter: Payer: Self-pay | Admitting: Sports Medicine

## 2015-01-09 ENCOUNTER — Other Ambulatory Visit: Payer: Self-pay | Admitting: Sports Medicine

## 2015-01-09 ENCOUNTER — Encounter: Payer: Self-pay | Admitting: Sports Medicine

## 2015-01-09 NOTE — Telephone Encounter (Signed)
PATIENT REQUEST REFILL FOR LEVOTHYROXINE #90 SENT TO [facility]. Rhonda Cunningham,CMA

## 2015-01-12 ENCOUNTER — Other Ambulatory Visit: Payer: Self-pay | Admitting: Sports Medicine

## 2015-01-24 ENCOUNTER — Ambulatory Visit (INDEPENDENT_AMBULATORY_CARE_PROVIDER_SITE_OTHER): Payer: 59 | Admitting: Sports Medicine

## 2015-01-24 ENCOUNTER — Encounter: Payer: Self-pay | Admitting: Sports Medicine

## 2015-01-24 VITALS — BP 132/66 | HR 63 | Wt 254.0 lb

## 2015-01-24 DIAGNOSIS — G5601 Carpal tunnel syndrome, right upper limb: Secondary | ICD-10-CM | POA: Diagnosis not present

## 2015-01-24 DIAGNOSIS — F429 Obsessive-compulsive disorder, unspecified: Secondary | ICD-10-CM

## 2015-01-24 DIAGNOSIS — F42 Obsessive-compulsive disorder: Secondary | ICD-10-CM | POA: Diagnosis not present

## 2015-01-24 MED ORDER — TRAZODONE HCL 50 MG PO TABS: ORAL_TABLET | ORAL | Status: DC

## 2015-01-24 NOTE — Assessment & Plan Note (Signed)
Well with Lexapro and trazodone, refilling trazodone, excellent response with her insomnia.

## 2015-01-24 NOTE — Progress Notes (Signed)
  Subjective:    CC:  Follow-up  HPI: Carpal tunnel syndrome: Bilateral, improved significantly with bilateral nighttime splinting however continues to have some numbness and tingling in the median nerve distribution.  Obsessive-compulsive disorder with mood disorder: Doing well with Lexapro, we added trazodone and she is sleeping well. No suicidal or homicidal ideation.  Past medical history, Surgical history, Family history not pertinant except as noted below, Social history, Allergies, and medications have been entered into the medical record, reviewed, and no changes needed.   Review of Systems: No fevers, chills, night sweats, weight loss, chest pain, or shortness of breath.   Objective:    General: Well Developed, well nourished, and in no acute distress.  Neuro: Alert and oriented x3, extra-ocular muscles intact, sensation grossly intact.  HEENT: Normocephalic, atraumatic, pupils equal round reactive to light, neck supple, no masses, no lymphadenopathy, thyroid nonpalpable.  Skin: Warm and dry, no rashes. Cardiac: Regular rate and rhythm, no murmurs rubs or gallops, no lower extremity edema.  Respiratory: Clear to auscultation bilaterally. Not using accessory muscles, speaking in full sentences.  Procedure: Real-time Ultrasound Guided Injection of left median nerve hydrodissection Device: GE Logiq E  Verbal informed consent obtained.  Time-out conducted.  Noted no overlying erythema, induration, or other signs of local infection.  Skin prepped in a sterile fashion.  Local anesthesia: Topical Ethyl chloride.  With sterile technique and under real time ultrasound guidance:  Using a total of 1 mL Kenalog 40 and 4 mL lidocaine injected medication both superficial to and deep to the median nerve in the carpal tunnel freeing it from surrounding structures, the needle was redirected deep within the carpal tunnel and additional medication was injected. Completed without difficulty  Pain  immediately resolved suggesting accurate placement of the medication.  Advised to call if fevers/chills, erythema, induration, drainage, or persistent bleeding.  Images permanently stored and available for review in the ultrasound unit.  Impression: Technically successful ultrasound guided injection.  Procedure: Real-time Ultrasound Guided Injection of right median nerve hydrodissection Device: GE Logiq E  Verbal informed consent obtained.  Time-out conducted.  Noted no overlying erythema, induration, or other signs of local infection.  Skin prepped in a sterile fashion.  Local anesthesia: Topical Ethyl chloride.  With sterile technique and under real time ultrasound guidance:  Using a total of 1 mL Kenalog 40 and 4 mL lidocaine injected medication both superficial to and deep to the median nerve in the carpal tunnel freeing it from surrounding structures, the needle was redirected deep within the carpal tunnel and additional medication was injected. Completed without difficulty  Pain immediately resolved suggesting accurate placement of the medication.  Advised to call if fevers/chills, erythema, induration, drainage, or persistent bleeding.  Images permanently stored and available for review in the ultrasound unit.  Impression: Technically successful ultrasound guided injection.  Impression and Recommendations:    I spent 40 minutes with this patient, greater than 50% was face-to-face time counseling regarding the above multiple diagnoses.

## 2015-01-24 NOTE — Assessment & Plan Note (Signed)
Lateral median nerve hydrodissection as above. Return in one month, continue nighttime splinting.

## 2015-01-28 ENCOUNTER — Other Ambulatory Visit: Payer: Self-pay | Admitting: Sports Medicine

## 2015-01-28 DIAGNOSIS — M5030 Other cervical disc degeneration, unspecified cervical region: Secondary | ICD-10-CM

## 2015-01-28 MED ORDER — TRAMADOL HCL (ER BIPHASIC) 300 MG PO CP24: 1.0000 | ORAL_CAPSULE | Freq: Every day | ORAL | Status: DC

## 2015-02-07 ENCOUNTER — Other Ambulatory Visit: Payer: Self-pay | Admitting: Sports Medicine

## 2015-02-26 ENCOUNTER — Other Ambulatory Visit: Payer: Self-pay | Admitting: Sports Medicine

## 2015-02-26 DIAGNOSIS — M5030 Other cervical disc degeneration, unspecified cervical region: Secondary | ICD-10-CM

## 2015-02-27 MED ORDER — TRAMADOL HCL (ER BIPHASIC) 300 MG PO CP24: 1.0000 | ORAL_CAPSULE | Freq: Every day | ORAL | Status: DC

## 2015-02-28 ENCOUNTER — Ambulatory Visit: Payer: Self-pay | Admitting: Sports Medicine

## 2015-03-07 ENCOUNTER — Encounter: Payer: Self-pay | Admitting: Sports Medicine

## 2015-03-07 ENCOUNTER — Ambulatory Visit (INDEPENDENT_AMBULATORY_CARE_PROVIDER_SITE_OTHER): Payer: 59 | Admitting: Sports Medicine

## 2015-03-07 VITALS — BP 156/72 | HR 74 | Ht 65.0 in | Wt 251.0 lb

## 2015-03-07 DIAGNOSIS — I10 Essential (primary) hypertension: Secondary | ICD-10-CM | POA: Diagnosis not present

## 2015-03-07 DIAGNOSIS — E669 Obesity, unspecified: Secondary | ICD-10-CM | POA: Diagnosis not present

## 2015-03-07 DIAGNOSIS — G5601 Carpal tunnel syndrome, right upper limb: Secondary | ICD-10-CM | POA: Diagnosis not present

## 2015-03-07 HISTORY — DX: Essential (primary) hypertension: I10

## 2015-03-07 NOTE — Assessment & Plan Note (Signed)
Will go to a full tab on phentermine, if no weight loss in one month, we will start Saxenda.

## 2015-03-07 NOTE — Assessment & Plan Note (Signed)
Completely resolved after median nerve hydrodissection, continue nocturnal splinting.

## 2015-03-07 NOTE — Assessment & Plan Note (Signed)
We will certainly keep an eye on this. If still elevated next visit we will start phentermine and start low-dose lisinopril.

## 2015-03-07 NOTE — Progress Notes (Signed)
  Subjective:    CC: Follow-up  HPI: Bilateral carpal tunnel syndrome: Doing well after median nerve hydrodissection. Symptoms have resolved.  Obesity: Unable to lose weight despite phentermine.  Elevated blood pressure: High now on several occasions, amenable start blood pressure medication at a future visit.  Past medical history, Surgical history, Family history not pertinant except as noted below, Social history, Allergies, and medications have been entered into the medical record, reviewed, and no changes needed.   Review of Systems: No fevers, chills, night sweats, weight loss, chest pain, or shortness of breath.   Objective:    General: Well Developed, well nourished, and in no acute distress.  Neuro: Alert and oriented x3, extra-ocular muscles intact, sensation grossly intact.  HEENT: Normocephalic, atraumatic, pupils equal round reactive to light, neck supple, no masses, no lymphadenopathy, thyroid nonpalpable.  Skin: Warm and dry, no rashes. Cardiac: Regular rate and rhythm, no murmurs rubs or gallops, no lower extremity edema.  Respiratory: Clear to auscultation bilaterally. Not using accessory muscles, speaking in full sentences.  Impression and Recommendations:

## 2015-03-26 ENCOUNTER — Encounter: Payer: Self-pay | Admitting: Sports Medicine

## 2015-03-27 ENCOUNTER — Other Ambulatory Visit: Payer: Self-pay | Admitting: Sports Medicine

## 2015-04-01 ENCOUNTER — Other Ambulatory Visit: Payer: Self-pay | Admitting: Sports Medicine

## 2015-04-01 DIAGNOSIS — M5030 Other cervical disc degeneration, unspecified cervical region: Secondary | ICD-10-CM

## 2015-04-02 MED ORDER — TRAMADOL HCL (ER BIPHASIC) 300 MG PO CP24: 1.0000 | ORAL_CAPSULE | Freq: Every day | ORAL | Status: DC

## 2015-04-02 NOTE — Addendum Note (Signed)
Addended by: Silverio Decamp on: 04/02/2015 08:25 AM   Modules accepted: Orders

## 2015-04-03 ENCOUNTER — Ambulatory Visit: Payer: Self-pay | Admitting: Sports Medicine

## 2015-04-08 ENCOUNTER — Encounter: Payer: Self-pay | Admitting: Sports Medicine

## 2015-04-09 ENCOUNTER — Encounter: Payer: Self-pay | Admitting: Sports Medicine

## 2015-04-09 ENCOUNTER — Ambulatory Visit (INDEPENDENT_AMBULATORY_CARE_PROVIDER_SITE_OTHER): Payer: 59 | Admitting: Sports Medicine

## 2015-04-09 DIAGNOSIS — E669 Obesity, unspecified: Secondary | ICD-10-CM

## 2015-04-09 DIAGNOSIS — I10 Essential (primary) hypertension: Secondary | ICD-10-CM

## 2015-04-09 MED ORDER — LIRAGLUTIDE 18 MG/3ML ~~LOC~~ SOPN: PEN_INJECTOR | SUBCUTANEOUS | Status: DC

## 2015-04-09 NOTE — Progress Notes (Signed)
  Subjective:    CC: weight check  HPI: Has not lost any additional weight on phentermine, amenable to try Saxenda.  Past medical history, Surgical history, Family history not pertinant except as noted below, Social history, Allergies, and medications have been entered into the medical record, reviewed, and no changes needed.   Review of Systems: No fevers, chills, night sweats, weight loss, chest pain, or shortness of breath.   Objective:    General: Well Developed, well nourished, and in no acute distress.  Neuro: Alert and oriented x3, extra-ocular muscles intact, sensation grossly intact.  HEENT: Normocephalic, atraumatic, pupils equal round reactive to light, neck supple, no masses, no lymphadenopathy, thyroid nonpalpable.  Skin: Warm and dry, no rashes. Cardiac: Regular rate and rhythm, no murmurs rubs or gallops, no lower extremity edema.  Respiratory: Clear to auscultation bilaterally. Not using accessory muscles, speaking in full sentences.  Impression and Recommendations:

## 2015-04-09 NOTE — Assessment & Plan Note (Signed)
Overall doing well. We do need to recheck some blood work.

## 2015-04-09 NOTE — Assessment & Plan Note (Signed)
Advised her to continue phentermine over the last month but she did not, weight is the same. I'm going to add Victoza for weight loss, we are out of Saxenda coupons. Return to see me every 2 months.

## 2015-04-18 LAB — HM MAMMOGRAPHY: HM Mammogram: NORMAL

## 2015-04-22 ENCOUNTER — Encounter: Payer: Self-pay | Admitting: Sports Medicine

## 2015-04-22 DIAGNOSIS — F429 Obsessive-compulsive disorder, unspecified: Secondary | ICD-10-CM

## 2015-04-23 ENCOUNTER — Telehealth: Payer: Self-pay | Admitting: *Deleted

## 2015-04-23 DIAGNOSIS — N926 Irregular menstruation, unspecified: Secondary | ICD-10-CM

## 2015-04-23 NOTE — Telephone Encounter (Signed)
[place] added to lab orders per pt & her OBGYN request.

## 2015-04-25 ENCOUNTER — Encounter: Payer: Self-pay | Admitting: Sports Medicine

## 2015-04-30 ENCOUNTER — Encounter: Payer: Self-pay | Admitting: Sports Medicine

## 2015-04-30 ENCOUNTER — Other Ambulatory Visit: Payer: Self-pay | Admitting: Sports Medicine

## 2015-04-30 DIAGNOSIS — F429 Obsessive-compulsive disorder, unspecified: Secondary | ICD-10-CM

## 2015-04-30 MED ORDER — TRAZODONE HCL 100 MG PO TABS: ORAL_TABLET | ORAL | Status: DC

## 2015-05-01 ENCOUNTER — Other Ambulatory Visit: Payer: Self-pay

## 2015-05-01 DIAGNOSIS — M961 Postlaminectomy syndrome, not elsewhere classified: Secondary | ICD-10-CM | POA: Insufficient documentation

## 2015-05-01 DIAGNOSIS — M5030 Other cervical disc degeneration, unspecified cervical region: Secondary | ICD-10-CM

## 2015-05-01 MED ORDER — TRAMADOL HCL (ER BIPHASIC) 300 MG PO CP24: 1.0000 | ORAL_CAPSULE | Freq: Every day | ORAL | Status: DC

## 2015-05-01 NOTE — Telephone Encounter (Signed)
Patient request refill for Tramadol. # 30 0 Refills was faxed to patient pharmacy.  ,CMA

## 2015-05-02 LAB — CBC
HCT: 39.8 % (ref 36.0–46.0)
Hemoglobin: 12.9 g/dL (ref 12.0–15.0)
MCH: 30 pg (ref 26.0–34.0)
MCHC: 32.4 g/dL (ref 30.0–36.0)
MCV: 92.6 fL (ref 78.0–100.0)
MPV: 9.8 fL (ref 8.6–12.4)
Platelets: 289 10*3/uL (ref 150–400)
RBC: 4.3 MIL/uL (ref 3.87–5.11)
RDW: 14.4 % (ref 11.5–15.5)
WBC: 6.8 10*3/uL (ref 4.0–10.5)

## 2015-05-02 LAB — COMPREHENSIVE METABOLIC PANEL WITH GFR
Albumin: 4 g/dL (ref 3.5–5.2)
BUN: 13 mg/dL (ref 6–23)
Calcium: 9.5 mg/dL (ref 8.4–10.5)
Glucose, Bld: 86 mg/dL (ref 70–99)
Potassium: 4.7 meq/L (ref 3.5–5.3)
Total Protein: 6.5 g/dL (ref 6.0–8.3)

## 2015-05-02 LAB — LIPID PANEL
Cholesterol: 174 mg/dL (ref 0–200)
HDL: 66 mg/dL (ref >=46)
LDL Cholesterol: 86 mg/dL (ref 0–99)
Total CHOL/HDL Ratio: 2.6 Ratio
Triglycerides: 108 mg/dL (ref <150)
VLDL: 22 mg/dL (ref 0–40)

## 2015-05-02 LAB — COMPREHENSIVE METABOLIC PANEL
ALT: 28 U/L (ref 0–35)
AST: 23 U/L (ref 0–37)
Alkaline Phosphatase: 39 U/L (ref 39–117)
CO2: 25 mEq/L (ref 19–32)
Chloride: 103 mEq/L (ref 96–112)
Creat: 0.56 mg/dL (ref 0.50–1.10)
Sodium: 137 mEq/L (ref 135–145)
Total Bilirubin: 0.3 mg/dL (ref 0.2–1.2)

## 2015-05-02 LAB — HEMOGLOBIN A1C
Hgb A1c MFr Bld: 5.9 % — ABNORMAL HIGH (ref <5.7)
Mean Plasma Glucose: 123 mg/dL — ABNORMAL HIGH (ref <117)

## 2015-05-02 LAB — TSH: TSH: 2.029 u[IU]/mL (ref 0.350–4.500)

## 2015-05-02 LAB — FOLLICLE STIMULATING HORMONE: FSH: 9.4 m[IU]/mL

## 2015-05-06 ENCOUNTER — Other Ambulatory Visit: Payer: Self-pay | Admitting: Sports Medicine

## 2015-05-08 ENCOUNTER — Encounter: Payer: Self-pay | Admitting: Sports Medicine

## 2015-05-14 ENCOUNTER — Ambulatory Visit: Payer: Self-pay | Admitting: Sports Medicine

## 2015-05-20 ENCOUNTER — Other Ambulatory Visit: Payer: Self-pay | Admitting: Sports Medicine

## 2015-05-25 ENCOUNTER — Other Ambulatory Visit: Payer: Self-pay | Admitting: Sports Medicine

## 2015-05-29 ENCOUNTER — Other Ambulatory Visit: Payer: Self-pay | Admitting: Sports Medicine

## 2015-05-30 ENCOUNTER — Other Ambulatory Visit: Payer: Self-pay

## 2015-05-30 DIAGNOSIS — M5030 Other cervical disc degeneration, unspecified cervical region: Secondary | ICD-10-CM

## 2015-05-30 MED ORDER — TRAMADOL HCL (ER BIPHASIC) 300 MG PO CP24: 1.0000 | ORAL_CAPSULE | Freq: Every day | ORAL | Status: DC

## 2015-06-04 ENCOUNTER — Encounter: Payer: Self-pay | Admitting: Sports Medicine

## 2015-06-04 ENCOUNTER — Ambulatory Visit (INDEPENDENT_AMBULATORY_CARE_PROVIDER_SITE_OTHER): Payer: 59 | Admitting: Sports Medicine

## 2015-06-04 ENCOUNTER — Ambulatory Visit: Payer: Self-pay | Admitting: Sports Medicine

## 2015-06-04 VITALS — BP 122/65 | HR 66 | Ht 65.0 in | Wt 249.0 lb

## 2015-06-04 DIAGNOSIS — G5603 Carpal tunnel syndrome, bilateral upper limbs: Secondary | ICD-10-CM

## 2015-06-04 DIAGNOSIS — G5602 Carpal tunnel syndrome, left upper limb: Secondary | ICD-10-CM

## 2015-06-04 DIAGNOSIS — G5601 Carpal tunnel syndrome, right upper limb: Secondary | ICD-10-CM

## 2015-06-04 NOTE — Assessment & Plan Note (Signed)
5 month response to the previous median nerve hydrodissection, repeat this time bilaterally.

## 2015-06-04 NOTE — Progress Notes (Signed)
  Subjective:    CC: carpal tunnel syndrome  HPI: This pleasant 49 year old female returns, we did a right-sided median nerve hydrodissection over 4 months ago, she had a good response until recently and desires repeat intervention today. Symptoms are present bilaterally. There also nerve conduction and EMG confirmed. Symptoms are moderate, persistent with radiation into the hand and into the volar forearm distally.  Past medical history, Surgical history, Family history not pertinant except as noted below, Social history, Allergies, and medications have been entered into the medical record, reviewed, and no changes needed.   Review of Systems: No fevers, chills, night sweats, weight loss, chest pain, or shortness of breath.   Objective:    General: Well Developed, well nourished, and in no acute distress.  Neuro: Alert and oriented x3, extra-ocular muscles intact, sensation grossly intact.  HEENT: Normocephalic, atraumatic, pupils equal round reactive to light, neck supple, no masses, no lymphadenopathy, thyroid nonpalpable.  Skin: Warm and dry, no rashes. Cardiac: Regular rate and rhythm, no murmurs rubs or gallops, no lower extremity edema.  Respiratory: Clear to auscultation bilaterally. Not using accessory muscles, speaking in full sentences. Wrists: Lateral positive Tinel's and Phalen signs. Minimal loss of 2 point discrimination at the fingertips.  Procedure: Real-time Ultrasound Guided hydrodissection of left median nerve at the carpal tunnel Device: GE Logiq E  Verbal informed consent obtained.  Time-out conducted.  Noted no overlying erythema, induration, or other signs of local infection.  Skin prepped in a sterile fashion.  Local anesthesia: Topical Ethyl chloride.  With sterile technique and under real time ultrasound guidance:  Using a 25-gauge needle and injected medication both superficial to and deep to the median nerve, free from stranding structures, needle was  redirected deep to the carpal tunnel and the rest of the medication was injected, I used a total of 1 mL kenalog 40, 4 mL lidocaine. Completed without difficulty  Pain immediately resolved suggesting accurate placement of the medication.  Advised to call if fevers/chills, erythema, induration, drainage, or persistent bleeding.  Images permanently stored and available for review in the ultrasound unit.  Impression: Technically successful ultrasound guided injection.  Procedure: Real-time Ultrasound Guided hydrodissection of right median nerve at the carpal tunnel Device: GE Logiq E  Verbal informed consent obtained.  Time-out conducted.  Noted no overlying erythema, induration, or other signs of local infection.  Skin prepped in a sterile fashion.  Local anesthesia: Topical Ethyl chloride.  With sterile technique and under real time ultrasound guidance:  Using a 25-gauge needle and injected medication both superficial to and deep to the median nerve, free from stranding structures, needle was redirected deep to the carpal tunnel and the rest of the medication was injected, I used a total of 1 mL kenalog 40, 4 mL lidocaine. Completed without difficulty  Pain immediately resolved suggesting accurate placement of the medication.  Advised to call if fevers/chills, erythema, induration, drainage, or persistent bleeding.  Images permanently stored and available for review in the ultrasound unit.  Impression: Technically successful ultrasound guided injection.  Impression and Recommendations:   I spent 25 minutes with this patient, greater than 50% was face-to-face time counseling regarding the above diagnoses

## 2015-06-27 ENCOUNTER — Encounter: Payer: Self-pay | Admitting: Sports Medicine

## 2015-06-27 DIAGNOSIS — M545 Low back pain: Secondary | ICD-10-CM

## 2015-07-01 ENCOUNTER — Ambulatory Visit (INDEPENDENT_AMBULATORY_CARE_PROVIDER_SITE_OTHER): Payer: 59 | Admitting: Sports Medicine

## 2015-07-01 ENCOUNTER — Encounter: Payer: Self-pay | Admitting: Sports Medicine

## 2015-07-01 VITALS — BP 138/75 | HR 68 | Ht 65.0 in | Wt 245.0 lb

## 2015-07-01 DIAGNOSIS — G5601 Carpal tunnel syndrome, right upper limb: Secondary | ICD-10-CM | POA: Diagnosis not present

## 2015-07-01 DIAGNOSIS — E669 Obesity, unspecified: Secondary | ICD-10-CM | POA: Diagnosis not present

## 2015-07-01 DIAGNOSIS — M5030 Other cervical disc degeneration, unspecified cervical region: Secondary | ICD-10-CM

## 2015-07-01 DIAGNOSIS — G5602 Carpal tunnel syndrome, left upper limb: Secondary | ICD-10-CM

## 2015-07-01 DIAGNOSIS — Z23 Encounter for immunization: Secondary | ICD-10-CM | POA: Diagnosis not present

## 2015-07-01 DIAGNOSIS — G5603 Carpal tunnel syndrome, bilateral upper limbs: Secondary | ICD-10-CM

## 2015-07-01 MED ORDER — TRAMADOL HCL (ER BIPHASIC) 300 MG PO CP24: 1.0000 | ORAL_CAPSULE | Freq: Every day | ORAL | Status: DC

## 2015-07-01 NOTE — Progress Notes (Signed)
  Subjective:    CC: Follow-up  HPI: Bilateral carpal tunnel syndrome: Only a minimal response this time to a bilateral median nerve hydrodissection, left is doing okay, the right continues to have occasional Tinel type pains.  She is not yet ready to consider surgical intervention.  Obesity: Good weight loss on Victoza so far at 1.8 mg. Side effects are minimal.  Past medical history, Surgical history, Family history not pertinant except as noted below, Social history, Allergies, and medications have been entered into the medical record, reviewed, and no changes needed.   Review of Systems: No fevers, chills, night sweats, weight loss, chest pain, or shortness of breath.   Objective:    General: Well Developed, well nourished, and in no acute distress.  Neuro: Alert and oriented x3, extra-ocular muscles intact, sensation grossly intact.  HEENT: Normocephalic, atraumatic, pupils equal round reactive to light, neck supple, no masses, no lymphadenopathy, thyroid nonpalpable.  Skin: Warm and dry, no rashes. Cardiac: Regular rate and rhythm, no murmurs rubs or gallops, no lower extremity edema.  Respiratory: Clear to auscultation bilaterally. Not using accessory muscles, speaking in full sentences.  Impression and Recommendations:    I spent 25 minutes with this patient, greater than 50% was face-to-face time counseling regarding the above diagnoses

## 2015-07-01 NOTE — Assessment & Plan Note (Signed)
Good weight loss over the past month. We are going to dose of Victoza like Saxenda. Patient will call for refills.

## 2015-07-01 NOTE — Assessment & Plan Note (Signed)
Moderate response to the median nerve hydrodissection at the last visit, unfortunately still having some pain, right worse than left.  Not bad enough yet to consider surgical intervention.

## 2015-07-03 ENCOUNTER — Encounter: Payer: Self-pay | Admitting: Sports Medicine

## 2015-07-09 ENCOUNTER — Encounter: Payer: Self-pay | Admitting: Sports Medicine

## 2015-07-09 DIAGNOSIS — F429 Obsessive-compulsive disorder, unspecified: Secondary | ICD-10-CM

## 2015-07-09 MED ORDER — TRAZODONE HCL 100 MG PO TABS: ORAL_TABLET | ORAL | Status: DC

## 2015-07-11 ENCOUNTER — Encounter: Payer: Self-pay | Admitting: Sports Medicine

## 2015-07-16 ENCOUNTER — Telehealth: Payer: Self-pay | Admitting: Sports Medicine

## 2015-07-16 NOTE — Telephone Encounter (Signed)
Received fax for prior authorization on Victoza sent through cover my meds waiting on authorization. - CF °

## 2015-07-28 ENCOUNTER — Other Ambulatory Visit: Payer: Self-pay | Admitting: Sports Medicine

## 2015-07-28 ENCOUNTER — Encounter: Payer: Self-pay | Admitting: Sports Medicine

## 2015-07-28 DIAGNOSIS — M5030 Other cervical disc degeneration, unspecified cervical region: Secondary | ICD-10-CM

## 2015-07-29 ENCOUNTER — Encounter: Payer: Self-pay | Admitting: Sports Medicine

## 2015-07-29 MED ORDER — TRAMADOL HCL (ER BIPHASIC) 300 MG PO CP24: 1.0000 | ORAL_CAPSULE | Freq: Every day | ORAL | Status: DC

## 2015-07-29 MED ORDER — PHENTERMINE HCL 37.5 MG PO CAPS: ORAL_CAPSULE | ORAL | Status: DC

## 2015-07-30 ENCOUNTER — Other Ambulatory Visit: Payer: Self-pay

## 2015-07-31 ENCOUNTER — Encounter: Payer: Self-pay | Admitting: Sports Medicine

## 2015-08-01 ENCOUNTER — Encounter: Payer: Self-pay | Admitting: Sports Medicine

## 2015-08-06 ENCOUNTER — Other Ambulatory Visit: Payer: Self-pay | Admitting: Sports Medicine

## 2015-08-21 ENCOUNTER — Other Ambulatory Visit: Payer: Self-pay | Admitting: Sports Medicine

## 2015-08-21 DIAGNOSIS — M5030 Other cervical disc degeneration, unspecified cervical region: Secondary | ICD-10-CM

## 2015-08-22 ENCOUNTER — Other Ambulatory Visit: Payer: Self-pay | Admitting: *Deleted

## 2015-08-22 ENCOUNTER — Other Ambulatory Visit: Payer: Self-pay | Admitting: Sports Medicine

## 2015-08-22 ENCOUNTER — Encounter: Payer: Self-pay | Admitting: Sports Medicine

## 2015-08-22 DIAGNOSIS — M5030 Other cervical disc degeneration, unspecified cervical region: Secondary | ICD-10-CM

## 2015-08-22 MED ORDER — ASPIRIN 81 MG PO TBEC: DELAYED_RELEASE_TABLET | ORAL | Status: AC

## 2015-08-22 MED ORDER — ROSUVASTATIN CALCIUM 10 MG PO TABS: 10.0000 mg | ORAL_TABLET | Freq: Every day | ORAL | Status: DC

## 2015-08-22 MED ORDER — TRAMADOL HCL (ER BIPHASIC) 300 MG PO CP24: 1.0000 | ORAL_CAPSULE | Freq: Every day | ORAL | Status: DC

## 2015-08-27 ENCOUNTER — Encounter: Payer: Self-pay | Admitting: Sports Medicine

## 2015-08-27 DIAGNOSIS — M47816 Spondylosis without myelopathy or radiculopathy, lumbar region: Secondary | ICD-10-CM

## 2015-08-29 ENCOUNTER — Ambulatory Visit: Payer: Self-pay | Admitting: Sports Medicine

## 2015-09-02 ENCOUNTER — Encounter: Payer: Self-pay | Admitting: Sports Medicine

## 2015-09-09 ENCOUNTER — Ambulatory Visit: Payer: Self-pay | Admitting: Sports Medicine

## 2015-10-15 ENCOUNTER — Encounter: Payer: Self-pay | Admitting: Sports Medicine

## 2015-10-15 DIAGNOSIS — M47816 Spondylosis without myelopathy or radiculopathy, lumbar region: Secondary | ICD-10-CM

## 2015-10-21 ENCOUNTER — Encounter: Payer: Self-pay | Admitting: Vascular Surgery

## 2015-10-31 ENCOUNTER — Encounter: Payer: Self-pay | Admitting: Sports Medicine

## 2015-10-31 ENCOUNTER — Ambulatory Visit (INDEPENDENT_AMBULATORY_CARE_PROVIDER_SITE_OTHER): Payer: BLUE CROSS/BLUE SHIELD | Admitting: Sports Medicine

## 2015-10-31 VITALS — BP 140/77 | HR 66 | Resp 18 | Wt 255.4 lb

## 2015-10-31 DIAGNOSIS — R195 Other fecal abnormalities: Secondary | ICD-10-CM | POA: Insufficient documentation

## 2015-10-31 DIAGNOSIS — M25511 Pain in right shoulder: Secondary | ICD-10-CM | POA: Diagnosis not present

## 2015-10-31 DIAGNOSIS — L918 Other hypertrophic disorders of the skin: Secondary | ICD-10-CM

## 2015-10-31 MED ORDER — MELOXICAM 15 MG PO TABS: ORAL_TABLET | ORAL | Status: DC

## 2015-10-31 NOTE — Assessment & Plan Note (Signed)
On the back and several on the face, return for cryotherapy.

## 2015-10-31 NOTE — Assessment & Plan Note (Signed)
Adding physical therapy, x-rays, meloxicam. Likely glenohumeral with some rotator cuff component.

## 2015-10-31 NOTE — Assessment & Plan Note (Signed)
Sending patient home with trying Hemoccult cards, she is on iron supplementation.

## 2015-10-31 NOTE — Progress Notes (Signed)
Subjective:    CC: Shoulder pain, back pain, and dark stools  HPI: Patient presents with a 3 month history of right shoulder pain. The pain is worse with overhead reaching and with lifting anything. She says that the pain gets better with rest and that it is only moderately controlled by her current pain regimen (tramadol in the morning and oxycodone at night). Pain does not wake her from sleep. 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 She denies weakness, numbness, or tingling of the arm/shoulder.  Patient also says that her chiropractor noticed a "bump" on her back in between her right shoulder blade and her spine. She says that it is tender but the pain is not elicited with movement. She is concerned and wanted to get evaluated here before going forward with her chiropractor. She denies any recent trauma but did say that her dog recently suffered an achilles injury and she has been spending more time than usual on the floor playing with the dog. She denies fevers, night sweets, or weight loss.  Patient's dark stools have been going on for around a month. She says that her father had "polyps" but she is unsure if anybody has ever had colorectal cancer. She denies fevers, night sweats, or weight loss. She denies palpitations, abdominal pain, change in her iron supplementation, diarrhea, or constipation. She does not feel more tired than usual or get light headed.  Past medical history, Surgical history, Family history not pertinant except as noted below, Social history, Allergies, and medications have been entered into the medical record, reviewed, and no changes needed.   Review of Systems: weight loss, chest pain, or shortness of breath.   Objective:    General: Well Developed, well nourished, and in no acute distress.  Neuro: Alert and oriented x3, extra-ocular muscles intact, sensation grossly intact.  HEENT:  Normocephalic, atraumatic, pupils equal round reactive to light, neck supple, no masses, no lymphadenopathy, thyroid nonpalpable.  Skin: Warm and dry, no rashes. Cardiac: Regular rate and rhythm, no murmurs rubs or gallops, no lower extremity edema.  Respiratory: Clear to auscultation bilaterally. Not using accessory muscles, speaking in full sentences. Abdominal: NT/ND. Substantial central adiposity. Normal bowel sounds. Back Exam:  Inspection: Skin tag overlying tender area. Normal otherwise. Motion: Flexion 45 deg, Extension 45 deg, Side Bending to 45 deg bilaterally,  Rotation to 45 deg bilaterally  SLR laying: Negative XSLR laying: Negative  Palpable tenderness: Tenderness noted throughout. Patient does have focal tenderness worse than elsewhere lateral to ~T4-T5 and medial to the right scapula. No masses palpated. FABER: positive. Sensory change: Gross sensation intact to all lumbar and sacral dermatomes.  Reflexes: 2+ at both patellar tendons, 2+ at achilles tendons, Babinski's downgoing.  Strength at foot  Plantar-flexion: 5/5 Dorsi-flexion: 5/5 Eversion: 5/5 Inversion: 5/5  Leg strength  Quad: 5/5 Hamstring: 5/5 Hip flexor: 5/5 Hip abductors: 5/5  Gait unremarkable. Right Shoulder: Inspection reveals no abnormalities, atrophy or asymmetry. Palpation elicits tenderness of the superior and anterior joint line with no abnormalities noted. Passive ROM is full in all planes with active ROM limited with abduction due to pain. Rotator cuff strength normal throughout. No signs of impingement with negative Neer and Hawkin's tests. empty can sign does bring on mild pain. Speeds and Yergason's tests normal. Negative Obrien's, but positive clunk and good stability. Normal scapular function observed. No painful arc and no drop arm sign. 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 Will Rx meloxicam and recommend PT for rehabilitation. Will also get X-rays today to r/o occult pathology and get a baseline of arthritis.  Patient's back "bump" is not overly concerning at this time. Most likely the patient has strained a muscle. Recommended rest from heavy lifting and instructed pt to return if Sxs do not resolve after 1 month or if they change in character (develop numbness, tingling, weakness, fevers, or swelling).  Patient's dark stools are most likely related to iron supplementation. However, since the patient has been on iron for years and endorses that this is a change, the patient was given a FOBT to take home and mail in. Will reassess need for further testing pending those results.  I spent 40 minutes with this patient, greater than 50% was face-to-face time counseling regarding the above diagnoses

## 2015-11-01 ENCOUNTER — Other Ambulatory Visit: Payer: Self-pay

## 2015-11-01 ENCOUNTER — Encounter: Payer: Self-pay | Admitting: Sports Medicine

## 2015-11-01 DIAGNOSIS — Z1211 Encounter for screening for malignant neoplasm of colon: Secondary | ICD-10-CM

## 2015-11-04 MED ORDER — MELOXICAM 15 MG PO TABS: ORAL_TABLET | ORAL | Status: DC

## 2015-11-06 ENCOUNTER — Encounter: Payer: Self-pay | Admitting: Sports Medicine

## 2015-11-09 ENCOUNTER — Other Ambulatory Visit: Payer: Self-pay | Admitting: Sports Medicine

## 2015-11-13 ENCOUNTER — Encounter: Payer: Self-pay | Admitting: Rehabilitative and Restorative Service Providers"

## 2015-11-13 ENCOUNTER — Ambulatory Visit (INDEPENDENT_AMBULATORY_CARE_PROVIDER_SITE_OTHER): Payer: BLUE CROSS/BLUE SHIELD | Admitting: Rehabilitative and Restorative Service Providers"

## 2015-11-13 DIAGNOSIS — R293 Abnormal posture: Secondary | ICD-10-CM | POA: Diagnosis not present

## 2015-11-13 DIAGNOSIS — M623 Immobility syndrome (paraplegic): Secondary | ICD-10-CM | POA: Diagnosis not present

## 2015-11-13 DIAGNOSIS — Z7409 Other reduced mobility: Secondary | ICD-10-CM | POA: Diagnosis not present

## 2015-11-13 DIAGNOSIS — M25511 Pain in right shoulder: Secondary | ICD-10-CM | POA: Diagnosis not present

## 2015-11-13 DIAGNOSIS — M2560 Stiffness of unspecified joint, not elsewhere classified: Secondary | ICD-10-CM

## 2015-11-13 NOTE — Patient Instructions (Signed)
Self massage with about a 4 inch rubber ball - soft or firm as you like   Axial Extension (Chin Tuck)    Pull chin in and lengthen back of neck. Hold _10-15___ seconds while counting out loud. Repeat _5-10___ times. Do __several __ sessions per day.   Shoulder Blade Squeeze    Rotate shoulders back, then squeeze shoulder blades down and back Hold 10 sec Repeat _10___ times. Do _several ___ sessions per day. Can use swim noodle along spine   Scapula Adduction With Pectoralis Stretch: Low - Standing   Shoulders at 45 hands even with shoulders, keeping weight through legs, shift weight forward until you feel pull or stretch through the front of your chest. Hold _30__ seconds. Do _3__ times, _2-4__ times per day.   Scapula Adduction With Pectoralis Stretch: Mid-Range - Standing   Shoulders at 90 elbows even with shoulders, keeping weight through legs, shift weight forward until you feel pull or strength through the front of your chest. Hold __30_ seconds. Do _3__ times, __2-4_ times per day.   Scapula Adduction With Pectoralis Stretch: High - Standing   Shoulders at 120 hands up high on the doorway, keeping weight on feet, shift weight forward until you feel pull or stretch through the front of your chest. Hold _30__ seconds. Do _3__ times, _2-3__ times per day.

## 2015-11-13 NOTE — Therapy (Signed)
[facility] [place] [place], [place], [address] Phone: [phone]   Fax:  [phone]  Physical Therapy Evaluation  Patient Details  Name: Kelsey Snow MRN: [id number] Date of Birth: 15-Apr-1966 Referring Provider: [name]   Encounter Date: 11/13/2015      PT End of Session - 11/13/15 1405    Visit Number 1   Number of Visits 12   Date for PT Re-Evaluation 12/25/15   PT Start Time 1410   PT Stop Time 1509   PT Time Calculation (min) 59 min   Activity Tolerance Patient tolerated treatment well;Patient limited by pain      Past Medical History  Diagnosis Date  . Thyroid disease   . Hyperlipidemia   . Obese   . Lumbosacral spondylolysis   . GERD (gastroesophageal reflux disease)   . OCD (obsessive compulsive disorder)   . Hx of ectopic pregnancy   . Anemia   . Essential hypertension, benign 03/07/2015    Past Surgical History  Procedure Laterality Date  . Tubal ligation    . Back surgery    . Ectopic pregnancy surgery    . Tubal ligation      There were no vitals filed for this visit.  Visit Diagnosis:  Pain in shoulder region, right - Plan: PT plan of care cert/re-cert  Abnormal posture - Plan: PT plan of care cert/re-cert  Stiffness due to immobility - Plan: PT plan of care cert/re-cert  Decreased functional mobility and endurance - Plan: PT plan of care cert/re-cert      Subjective Assessment - 11/13/15 1411    Subjective Patient reports that she has a dog that she has been training with the dog pulling into the Rt UE. She has had pulling and pain down into the biceps over then past 3 months. She has intermittent pain in the Rt shoudler.    Pertinent History DDD cervical to lumbar spine - in spinal rehab at [facility] for ~2 yrs deep tissue work with "german instruments" with work through the thoracic spine. 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 L5/S1  laminectomy 08/07/13; bilateral carpal tunnel - Rt  recommended surgery - has not had sx    How long can you sit comfortably? 1-1 1/2 hours    How long can you stand comfortably? 15 min - burning in LE's    How long can you walk comfortably? 2-6 miles Lt LE "weakens" after about a mile    Diagnostic tests xrays/MRI   Patient Stated Goals function with arms again    Currently in Pain? Yes   Pain Score 5    Pain Location Shoulder   Pain Orientation Right;Anterior   Pain Descriptors / Indicators Aching;Pressure   Pain Type Chronic pain   Pain Radiating Towards down arm at times - front into the biceps    Pain Onset More than a month ago   Pain Frequency Intermittent   Aggravating Factors  housecleaning; cutting food; lifting; household chores; walking dog on leash    Pain Relieving Factors rest; meds; biofreeze            [facility] PT Assessment - 11/13/15 0001    Assessment   Medical Diagnosis Rt shoulder pain    Referring Provider Thekkekandam    Onset Date/Surgical Date 04/03/15   Hand Dominance Right   Next MD Visit 12/02/15   Prior Therapy yes for spine    Precautions   Precaution Comments avoid  lifting > 25#    Balance Screen   Has the patient fallen in the past 6 months Yes   How many times? 2   Has the patient had a decrease in activity level because of a fear of falling?  No   Is the patient reluctant to leave their home because of a fear of falling?  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    Leisure some household chores; walking dog; appointment; food shopping; stretching for back rehab; occassional biking    Observation/Other Assessments   Focus on Therapeutic Outcomes (FOTO)  48%  limitation    Sensation   Additional Comments intermittent numbness bilat UE's for carpal tunnel    Posture/Postural Control   Posture Comments head forward; shoulders rounded and elevated; head of the humerus anterior in orientatioin; increased thoracic kyphosis; scapulae abducted and rotated along the thoracic wall   AROM   AROM Assessment Site --  assessed in standing    Right Shoulder Extension 53 Degrees   Right Shoulder Flexion 141 Degrees  pulling   Right Shoulder ABduction 147 Degrees   Right Shoulder Internal Rotation 27 Degrees   Right Shoulder External Rotation 90 Degrees   Left Shoulder Extension 52 Degrees   Left Shoulder Flexion 150 Degrees   Left Shoulder ABduction 147 Degrees   Left Shoulder Internal Rotation 33 Degrees   Left Shoulder External Rotation 100 Degrees   Cervical Flexion 36   Cervical Extension 38   Cervical - Right Side Bend 25   Cervical - Left Side Bend 33   Cervical - Right Rotation 62   Cervical - Left Rotation 63   Strength   Overall Strength Comments grossly 5/5 bilat UE's    Palpation   Palpation comment muscular tenderness and tightness through ant/lat/post cervical musculature; upper traps; leveator; paraspinals cervical and thoracic spine; middle and lower traps and rhomboids                   [facility] Adult PT Treatment/Exercise - 11/13/15 0001    Therapeutic Activites    Therapeutic Activities --  myofacial ball release work    Neuro Re-ed    Neuro Re-ed Details  postural correction engaging posterior shoulder girdle musculature    Shoulder Exercises: Standing   Retraction Both;10 reps  10 sec hold with swim noodle    Other Standing Exercises axial extension 10 sec x 5    Shoulder Exercises: Stretch   Corner Stretch Limitations 3 way doorway 30 sec x 2 each position   caution to be gentle initially    Moist Heat Therapy   Number Minutes Moist Heat 15 Minutes   Moist Heat Location Cervical;Shoulder  cervical/Rt  shd/thoracic spine    Electrical Stimulation   Electrical Stimulation Location Rt ant/superior/posterior shd/pecs   Electrical Stimulation Action IFC   Electrical Stimulation Parameters to tolerance    Electrical Stimulation Goals Pain;Tone                PT Education - 11/13/15 1504    Education provided Yes   Education Details posture; myofacial release with ball; HEP    Person(s) Educated Patient   Methods  Explanation;Demonstration;Tactile cues;Verbal cues;Handout   Comprehension Verbalized understanding;Returned demonstration;Verbal cues required;Tactile cues required             PT Long Term Goals - 11/13/15 1508    PT LONG TERM GOAL #1   Title Improve posture and alignment - with scapulae in better alignment along the thoracic wall and head of the humerus more in anatomical position 12/25/15   Time 6   Period Weeks   Status New   PT LONG TERM GOAL #2   Title Increase AROM Rt shoulder to equal or better than Lt shoulder 12/25/15   Time 6   Period Weeks   Status New   PT LONG TERM GOAL #3   Title Decrease pain in Rt shoudler with patient reporting 50-70% less pain for 50% of the day 12/25/15   Time 6   Period Weeks   Status New   PT LONG TERM GOAL #4   Title I IN HEP 12/25/15   Time 6   Period Weeks   Status New   PT LONG TERM GOAL #5   Title Improve FOT to </= 34% limitation 12/25/15   Time 6   Period Weeks   Status New               Plan - 11/13/15 1505    Clinical Impression Statement Patient presents with c/o Rt shoudler pain which is consistent with muscular tightness/dysfunction. She has limited ROM; muscular tightness to palpation; poor posture and alignment; limited functional activity level. Pt has history of chronic myofacial pain and spinal dysfunction. She will benefit from PT to address current problems identified with Rt shoulder.    Pt will benefit from skilled therapeutic intervention in order to improve on the following deficits Postural  dysfunction;Improper body mechanics;Decreased range of motion;Decreased mobility;Increased fascial restricitons;Decreased endurance;Decreased activity tolerance;Pain   Rehab Potential Good   PT Frequency 2x / week   PT Duration 6 weeks   PT Treatment/Interventions Patient/family education;ADLs/Self Care Home Management;Neuromuscular re-education;Manual techniques;Dry needling;Cryotherapy;Electrical Stimulation;Moist Heat;Therapeutic exercise;Therapeutic activities;Ultrasound   PT Next Visit Plan progress with gentle stretching and postural strengthening    PT Home Exercise Plan myofacial ball release work; HEP   Consulted and Agree with Plan of Care Patient         Problem List Patient Active Problem List   Diagnosis Date Noted  . Right shoulder pain 10/31/2015  . Dark stools 10/31/2015  . Essential hypertension, benign 03/07/2015  . Carpal tunnel syndrome, bilateral 11/29/2014  . Carotid artery dissection ([place]) 04/26/2014  . Degenerative disc disease, cervical 04/13/2014  . Perennial allergic rhinitis 11/16/2013  . Spotting 11/16/2013  . Skin tag 11/16/2013  . Obsessive compulsive disorder 10/10/2013  . Hypothyroidism 10/10/2013  . Obesity 10/10/2013  . Annual physical exam 10/10/2013  . Lumbar degenerative disc disease 10/10/2013  . Perimenopausal 10/10/2013     Nilda Simmer PT, MPH  11/13/2015, 3:13 PM  [facility] [place], [place], [address] Phone: [phone]   Fax:  [phone]  Name: Anea Wisch MRN: [id number] Date of Birth: 1965-12-27

## 2015-11-15 ENCOUNTER — Encounter: Payer: Self-pay | Admitting: Sports Medicine

## 2015-11-15 DIAGNOSIS — M51369 Other intervertebral disc degeneration, lumbar region without mention of lumbar back pain or lower extremity pain: Secondary | ICD-10-CM

## 2015-11-15 DIAGNOSIS — M5136 Other intervertebral disc degeneration, lumbar region: Secondary | ICD-10-CM

## 2015-11-18 ENCOUNTER — Other Ambulatory Visit: Payer: Self-pay | Admitting: Sports Medicine

## 2015-11-18 DIAGNOSIS — Z1211 Encounter for screening for malignant neoplasm of colon: Secondary | ICD-10-CM

## 2015-11-18 DIAGNOSIS — M5030 Other cervical disc degeneration, unspecified cervical region: Secondary | ICD-10-CM

## 2015-11-18 LAB — POC HEMOCCULT BLD/STL (HOME/3-CARD/SCREEN)
Card #1 Date: 1
Card #2 Date: 2
Card #2 Fecal Occult Blod, POC: NEGATIVE
Card #3 Date: 3
Card #3 Fecal Occult Blood, POC: NEGATIVE
Fecal Occult Blood, POC: NEGATIVE

## 2015-11-18 MED ORDER — TRAMADOL HCL (ER BIPHASIC) 300 MG PO CP24: 1.0000 | ORAL_CAPSULE | Freq: Every day | ORAL | Status: DC

## 2015-11-19 ENCOUNTER — Ambulatory Visit (INDEPENDENT_AMBULATORY_CARE_PROVIDER_SITE_OTHER): Payer: BLUE CROSS/BLUE SHIELD | Admitting: Physical Therapy

## 2015-11-19 DIAGNOSIS — R293 Abnormal posture: Secondary | ICD-10-CM | POA: Diagnosis not present

## 2015-11-19 DIAGNOSIS — M25511 Pain in right shoulder: Secondary | ICD-10-CM

## 2015-11-19 DIAGNOSIS — M623 Immobility syndrome (paraplegic): Secondary | ICD-10-CM

## 2015-11-19 DIAGNOSIS — M2560 Stiffness of unspecified joint, not elsewhere classified: Secondary | ICD-10-CM

## 2015-11-19 DIAGNOSIS — Z7409 Other reduced mobility: Secondary | ICD-10-CM

## 2015-11-19 NOTE — Patient Instructions (Signed)
Over Head Pull: Narrow Grip     [place] [phone]   On back, knees bent, feet flat, band across thighs, elbows straight but relaxed. Pull hands apart (start). Keeping elbows straight, bring arms up and over head, hands toward floor. Keep pull steady on band. Hold momentarily. Return slowly, keeping pull steady, back to start. Repeat _10 __ times. Band color __red____   Side Pull: Double Arm   On back, knees bent, feet flat. Arms perpendicular to body, shoulder level, elbows straight but relaxed. Pull arms out to sides, elbows straight. Resistance band comes across collarbones, hands toward floor. Hold momentarily. Slowly return to starting position. Repeat _10__ times. Band color ___red__   Elmer Picker   On back, knees bent, feet flat, left hand on left hip, right hand above left. Pull right arm DIAGONALLY (hip to shoulder) across chest. Bring right arm along head toward floor. Hold momentarily. Slowly return to starting position. (thumb up)  Repeat _10__ times. Do with left arm. Band color __red____   Shoulder Rotation: Double Arm   On back, knees bent, feet flat, elbows tucked at sides, bent 90, hands palms up. Pull hands apart and down toward floor, keeping elbows near sides. Hold momentarily. Slowly return to starting position. Repeat _10__ times, 2 sets. Band color ___red ___

## 2015-11-19 NOTE — Therapy (Signed)
[place], [place], [address] Phone: [phone]   Fax:  [phone]  Physical Therapy Treatment  Patient Details  Name: Kelsey Snow MRN: [id number] Date of Birth: 03-14-1966 Referring Provider: Dr. Helane Rima  Encounter Date: 11/19/2015      PT End of Session - 11/19/15 1244    Visit Number 2   Number of Visits 12   Date for PT Re-Evaluation 12/25/15   PT Start Time 1150   PT Stop Time 1253   PT Time Calculation (min) 63 min   Activity Tolerance Patient tolerated treatment well      Past Medical History  Diagnosis Date  . Thyroid disease   . Hyperlipidemia   . Obese   . Lumbosacral spondylolysis   . GERD (gastroesophageal reflux disease)   . OCD (obsessive compulsive disorder)   . Hx of ectopic pregnancy   . Anemia   . Essential hypertension, benign 03/07/2015    Past Surgical History  Procedure Laterality Date  . Tubal ligation    . Back surgery    . Ectopic pregnancy surgery    . Tubal ligation      There were no vitals filed for this visit.  Visit Diagnosis:  Pain in shoulder region, right  Abnormal posture  Stiffness due to immobility  Decreased functional mobility and endurance      Subjective Assessment - 11/19/15 1200    Subjective Pt reports she was very sore 48 hours after last session.  Hasn't had much time to complete HEP.  Took pain medicine prior to treatement.    Currently in Pain? Yes   Pain Score 5    Pain Location Shoulder  posterior scapula   Pain Orientation Right;Left   Pain Descriptors / Indicators Stabbing   Aggravating Factors  housecleaning, lifting dog, walking dog.    Pain Relieving Factors rest, meds             [facility] PT Assessment - 11/19/15 0001    Assessment   Medical Diagnosis Rt shoulder pain    Referring Provider Dr. Helane Rima   Onset Date/Surgical Date 04/03/15   Hand Dominance Right   Next MD Visit 12/02/15          [facility] Adult PT Treatment/Exercise - 11/19/15 0001    Exercises   Exercises Neck   Neck Exercises: Seated   Neck Retraction 5 reps   Neck Retraction Limitations tactile cues to improve form    Neck Exercises: Supine   Neck Retraction 10 reps;5 secs   Other Supine Exercise shoulder press/thoracic lift x 10 reps with 3 sec hold.    Shoulder Exercises: Supine   Horizontal ABduction Strengthening;Both;10 reps  2 sets, one with red/ one with yellow   Theraband Level (Shoulder Horizontal ABduction) Level 1 (Yellow);Level 2 (Red)   External Rotation Strengthening;Both;10 reps;Theraband  2 sets   Theraband Level (Shoulder External Rotation) Level 2 (Red)   Flexion Both;10 reps;Theraband   Theraband Level (Shoulder Flexion) Level 1 (Yellow)   Other Supine Exercises Sash RUE with yellow x 10, with red band x 10    Shoulder Exercises: Stretch   Corner Stretch Limitations 3 way doorway 30 sec x 2 each position   switched high position to unilateral.    Moist Heat Therapy   Number Minutes Moist Heat 15 Minutes   Moist Heat Location Cervical;Shoulder  cervical/Rt shd/thoracic spine    Electrical Stimulation   Electrical Stimulation Location Rt ant/superior/posterior shd/pecs  Electrical Stimulation Action IFC   Electrical Stimulation Parameters to tolerance   Electrical Stimulation Goals Pain;Tone   Neck Exercises: Stretches   Upper Trapezius Stretch 2 reps;20 seconds     snow angel in hooklying x 10 reps with 10 sec pause in ~100 of abd for stretch.        PT Education - 11/19/15 1250    Education provided Yes   Education Details HEP   Person(s) Educated Patient   Methods Explanation;Handout   Comprehension Returned demonstration;Verbalized understanding             PT Long Term Goals - 11/19/15 1250    PT LONG TERM GOAL #1   Title Improve posture and alignment - with scapulae in better alignment along the thoracic wall and head of the humerus more in anatomical  position 12/25/15   Time 6   Period Weeks   Status On-going   PT LONG TERM GOAL #2   Title Increase AROM Rt shoulder to equal or better than Lt shoulder 12/25/15   Time 6   Period Weeks   Status On-going   PT LONG TERM GOAL #3   Title Decrease pain in Rt shoudler with patient reporting 50-70% less pain for 50% of the day 12/25/15   Time 6   Period Weeks   Status On-going   PT LONG TERM GOAL #4   Title I IN HEP 12/25/15   Time 6   Period Weeks   Status On-going   PT LONG TERM GOAL #5   Title Improve FOTO  to </= 34% limitation 12/25/15   Time 6   Period Weeks   Status On-going               Plan - 11/19/15 1245    Clinical Impression Statement Pt tolerated new exercises well, but had some wrist symptoms when grasping band.  Pt issued band with handles to decrease discomfort/symptoms. Pt reported decrease in shoulder pain with use of MHP and estim at end of session.  Only second session, no goals met yet.    Pt will benefit from skilled therapeutic intervention in order to improve on the following deficits Postural dysfunction;Improper body mechanics;Decreased range of motion;Decreased mobility;Increased fascial restricitons;Decreased endurance;Decreased activity tolerance;Pain   Rehab Potential Good   PT Frequency 2x / week   PT Duration 6 weeks   PT Treatment/Interventions Patient/family education;ADLs/Self Care Home Management;Neuromuscular re-education;Manual techniques;Dry needling;Cryotherapy;Electrical Stimulation;Moist Heat;Therapeutic exercise;Therapeutic activities;Ultrasound   PT Next Visit Plan progress with gentle stretching and postural strengthening    Consulted and Agree with Plan of Care Patient        Problem List Patient Active Problem List   Diagnosis Date Noted  . Right shoulder pain 10/31/2015  . Dark stools 10/31/2015  . Essential hypertension, benign 03/07/2015  . Carpal tunnel syndrome, bilateral 11/29/2014  . Carotid artery dissection ([place])  04/26/2014  . Degenerative disc disease, cervical 04/13/2014  . Perennial allergic rhinitis 11/16/2013  . Spotting 11/16/2013  . Skin tag 11/16/2013  . Obsessive compulsive disorder 10/10/2013  . Hypothyroidism 10/10/2013  . Obesity 10/10/2013  . Annual physical exam 10/10/2013  . Lumbar degenerative disc disease 10/10/2013  . Perimenopausal 10/10/2013   Kerin Perna, PTA 11/19/2015 1:09 PM  [place], [place], [address] Phone: [phone]   Fax:  [phone]  Name: Kelsey Snow MRN: [id number] Date of Birth: [birth year]

## 2015-11-22 ENCOUNTER — Encounter: Payer: Self-pay | Admitting: Vascular Surgery

## 2015-11-22 ENCOUNTER — Ambulatory Visit (INDEPENDENT_AMBULATORY_CARE_PROVIDER_SITE_OTHER): Payer: BLUE CROSS/BLUE SHIELD | Admitting: Physical Therapy

## 2015-11-22 DIAGNOSIS — Z7409 Other reduced mobility: Secondary | ICD-10-CM | POA: Diagnosis not present

## 2015-11-22 DIAGNOSIS — M25511 Pain in right shoulder: Secondary | ICD-10-CM | POA: Diagnosis not present

## 2015-11-22 DIAGNOSIS — R293 Abnormal posture: Secondary | ICD-10-CM | POA: Diagnosis not present

## 2015-11-22 DIAGNOSIS — M623 Immobility syndrome (paraplegic): Secondary | ICD-10-CM | POA: Diagnosis not present

## 2015-11-22 DIAGNOSIS — M2560 Stiffness of unspecified joint, not elsewhere classified: Secondary | ICD-10-CM

## 2015-11-22 NOTE — Therapy (Signed)
[place], [place], [address] Phone: [phone]   Fax:  [phone]  Physical Therapy Treatment  Patient Details  Name: Kelsey Snow MRN: [id number] Date of Birth: 1966/02/20 Referring Provider: Dr. Helane Rima  Encounter Date: 11/22/2015      PT End of Session - 11/22/15 1408    Visit Number 3   Number of Visits 12   Date for PT Re-Evaluation 12/25/15   PT Start Time [id number]   PT Stop Time 1501   PT Time Calculation (min) 57 min      Past Medical History  Diagnosis Date  . Thyroid disease   . Hyperlipidemia   . Obese   . Lumbosacral spondylolysis   . GERD (gastroesophageal reflux disease)   . OCD (obsessive compulsive disorder)   . Hx of ectopic pregnancy   . Anemia   . Essential hypertension, benign 03/07/2015    Past Surgical History  Procedure Laterality Date  . Tubal ligation    . Back surgery    . Ectopic pregnancy surgery    . Tubal ligation      There were no vitals filed for this visit.  Visit Diagnosis:  Pain in shoulder region, right  Abnormal posture  Stiffness due to immobility  Decreased functional mobility and endurance      Subjective Assessment - 11/22/15 1408    Subjective Pt reports she went to chiropractor yesterday and "got a good crack".  Was not as sore as she was after first session.    Currently in Pain? Yes   Pain Score 4    Pain Location Shoulder   Pain Orientation Right            [facility] PT Assessment - 11/22/15 0001    Assessment   Medical Diagnosis Rt shoulder pain    Referring Provider Dr. Helane Rima   Onset Date/Surgical Date 04/03/15   Hand Dominance Right   Next MD Visit 12/02/15   Prior Therapy yes for spine    AROM   Right Shoulder Flexion 150 Degrees   Left Shoulder Flexion 145 Degrees           [facility] Adult PT Treatment/Exercise - 11/22/15 0001    Shoulder Exercises: Supine   Horizontal ABduction  Strengthening;Both;10 reps   Theraband Level (Shoulder Horizontal ABduction) Level 2 (Red)   External Rotation Strengthening;10 reps;Theraband;Both  2 sets   Theraband Level (Shoulder External Rotation) Level 2 (Red)   Other Supine Exercises Sash with red band x 10 reps each side x 2 sets    Shoulder Exercises: Standing   Extension Strengthening;Both;10 reps;Theraband  2 sets   Theraband Level (Shoulder Extension) Level 1 (Yellow);Level 2 (Red)   Row Strengthening;Both;10 reps;Theraband  2 sets   Theraband Level (Shoulder Row) Level 2 (Red);Level 3 (Green)   Shoulder Exercises: ROM/Strengthening   UBE (Upper Arm Bike) L1: 2 min forward/  2 min backward    Shoulder Exercises: Stretch   Other Shoulder Stretches Hooklying on 1/2 foam roll:  snow angels x 10 with 10 sec hold in ~120 deg abd   Moist Heat Therapy   Number Minutes Moist Heat 12 Minutes   Moist Heat Location Shoulder  Rt shd/thoracic spine    Electrical Stimulation   Electrical Stimulation Location Rt ant/superior/posterior shd/pecs   Electrical Stimulation Action IFC   Electrical Stimulation Parameters to tolerance    Electrical Stimulation Goals Pain;Tone           PT Long  Term Goals - 11/19/15 1250    PT LONG TERM GOAL #1   Title Improve posture and alignment - with scapulae in better alignment along the thoracic wall and head of the humerus more in anatomical position 12/25/15   Time 6   Period Weeks   Status On-going   PT LONG TERM GOAL #2   Title Increase AROM Rt shoulder to equal or better than Lt shoulder 12/25/15   Time 6   Period Weeks   Status On-going   PT LONG TERM GOAL #3   Title Decrease pain in Rt shoudler with patient reporting 50-70% less pain for 50% of the day 12/25/15   Time 6   Period Weeks   Status On-going   PT LONG TERM GOAL #4   Title I IN HEP 12/25/15   Time 6   Period Weeks   Status On-going   PT LONG TERM GOAL #5   Title Improve FOTO  to </= 34% limitation 12/25/15   Time 6   Period  Weeks   Status On-going               Plan - 11/22/15 1641    Clinical Impression Statement Pt had difficulty tolerating laying on 1/2 foam roll for snow angel; improved comfort without.  Pt reported she had muscle fatigue and some soreness after exercise, but not increased pain.  Pt reported decrease in shoulder pain wiht use of MHP and estim at end of session. Progressing towards goals.    Pt will benefit from skilled therapeutic intervention in order to improve on the following deficits Postural dysfunction;Improper body mechanics;Decreased range of motion;Decreased mobility;Increased fascial restricitons;Decreased endurance;Decreased activity tolerance;Pain   Rehab Potential Good   PT Frequency 2x / week   PT Duration 6 weeks   PT Treatment/Interventions Patient/family education;ADLs/Self Care Home Management;Neuromuscular re-education;Manual techniques;Dry needling;Cryotherapy;Electrical Stimulation;Moist Heat;Therapeutic exercise;Therapeutic activities;Ultrasound   PT Next Visit Plan progress with gentle stretching and postural strengthening    Consulted and Agree with Plan of Care Patient        Problem List Patient Active Problem List   Diagnosis Date Noted  . Right shoulder pain 10/31/2015  . Dark stools 10/31/2015  . Essential hypertension, benign 03/07/2015  . Carpal tunnel syndrome, bilateral 11/29/2014  . Carotid artery dissection ([place]) 04/26/2014  . Degenerative disc disease, cervical 04/13/2014  . Perennial allergic rhinitis 11/16/2013  . Spotting 11/16/2013  . Skin tag 11/16/2013  . Obsessive compulsive disorder 10/10/2013  . Hypothyroidism 10/10/2013  . Obesity 10/10/2013  . Annual physical exam 10/10/2013  . Lumbar degenerative disc disease 10/10/2013  . Perimenopausal 10/10/2013   Kerin Perna, PTA 11/22/2015 4:42 PM  [place], [place], [address] Phone:  [phone]   Fax:  [phone]  Name: Kelsey Snow MRN: [id number] Date of Birth: [birth year]

## 2015-11-26 ENCOUNTER — Ambulatory Visit (INDEPENDENT_AMBULATORY_CARE_PROVIDER_SITE_OTHER): Payer: BLUE CROSS/BLUE SHIELD | Admitting: Physical Therapy

## 2015-11-26 DIAGNOSIS — M623 Immobility syndrome (paraplegic): Secondary | ICD-10-CM

## 2015-11-26 DIAGNOSIS — R293 Abnormal posture: Secondary | ICD-10-CM | POA: Diagnosis not present

## 2015-11-26 DIAGNOSIS — M25511 Pain in right shoulder: Secondary | ICD-10-CM

## 2015-11-26 DIAGNOSIS — Z7409 Other reduced mobility: Secondary | ICD-10-CM | POA: Diagnosis not present

## 2015-11-26 DIAGNOSIS — M2560 Stiffness of unspecified joint, not elsewhere classified: Secondary | ICD-10-CM

## 2015-11-26 NOTE — Therapy (Signed)
[place], [place], [address] Phone: [phone]   Fax:  [phone]  Physical Therapy Treatment  Patient Details  Name: Kelsey Snow MRN: [id number] Date of Birth: [birth year] Referring Provider: Dr. Helane Rima  Encounter Date: 11/26/2015      PT End of Session - 11/26/15 1105    Visit Number 4   Number of Visits 12   Date for PT Re-Evaluation 12/25/15   PT Start Time 1020  pt arrived late   PT Stop Time 1115   PT Time Calculation (min) 55 min   Activity Tolerance Patient tolerated treatment well;No increased pain      Past Medical History  Diagnosis Date  . Thyroid disease   . Hyperlipidemia   . Obese   . Lumbosacral spondylolysis   . GERD (gastroesophageal reflux disease)   . OCD (obsessive compulsive disorder)   . Hx of ectopic pregnancy   . Anemia   . Essential hypertension, benign 03/07/2015    Past Surgical History  Procedure Laterality Date  . Tubal ligation    . Back surgery    . Ectopic pregnancy surgery    . Tubal ligation      There were no vitals filed for this visit.  Visit Diagnosis:  Pain in shoulder region, right  Abnormal posture  Stiffness due to immobility  Decreased functional mobility and endurance      Subjective Assessment - 11/26/15 1025    Subjective Pt reports her shoulder feels good today, has improved significantly since beginning therapy. She notes that the Rt shoulder/neck area does not feel as "knotty".   Has been performing HEP on own.     Currently in Pain? Yes   Pain Score 3    Pain Location Back   Pain Orientation Lower   Pain Descriptors / Indicators Aching   Aggravating Factors  bending    Pain Relieving Factors sitting in recliner             [facility] PT Assessment - 11/26/15 0001    Assessment   Medical Diagnosis Rt shoulder pain    Referring Provider Dr. Helane Rima   Onset Date/Surgical Date 04/03/15   Hand  Dominance Right   Next MD Visit 12/02/15   Prior Therapy yes for spine           [facility] Adult PT Treatment/Exercise - 11/26/15 0001    Exercises   Exercises Lumbar;Neck;Shoulder   Neck Exercises: Prone   W Back 10 reps  with axial ext.    Lumbar Exercises: Stretches   Passive Hamstring Stretch 2 reps;20 seconds  each leg   ITB Stretch 2 reps;20 seconds  each leg   ITB Stretch Limitations followed by adductor stretch with strap   Lumbar Exercises: Aerobic   Stationary Bike NuStep L4: arms and legs x 5 min    Shoulder Exercises: Prone   Extension Strengthening;Both;10 reps  with axial extension    Horizontal ABduction 1 Both;10 reps  with axial extension   Other Prone Exercises Opp arm/leg x 5 reps each side, 2 sets    Shoulder Exercises: Stretch   Corner Stretch Limitations mid level and low level doorway stretch x 20 sec x 2 reps each side    Moist Heat Therapy   Number Minutes Moist Heat 15 Minutes   Moist Heat Location Shoulder;Lumbar Spine  Rt shd/thoracic spine    Electrical Stimulation   Electrical Stimulation Location Rt rhomboid/infraspinatus and bilat lumbar paraspinals  Electrical Stimulation Action premod to each area   Electrical Stimulation Parameters to tolerance    Electrical Stimulation Goals Pain   Neck Exercises: Stretches   Upper Trapezius Stretch 2 reps;20 seconds     Educated pt on proper core engagement with one of her existing exercises she performs (not in HEP) to help reduce increased LBP.  Pt verbalized understanding.         PT Long Term Goals - 11/26/15 1108    PT LONG TERM GOAL #1   Title Improve posture and alignment - with scapulae in better alignment along the thoracic wall and head of the humerus more in anatomical position 12/25/15   Time 6   Period Weeks   Status On-going   PT LONG TERM GOAL #2   Title Increase AROM Rt shoulder to equal or better than Lt shoulder 12/25/15   Time 6   Period Weeks   Status On-going   PT LONG TERM  GOAL #3   Title Decrease pain in Rt shoudler with patient reporting 50-70% less pain for 50% of the day 12/25/15   Time 6   Period Weeks   Status Achieved   PT LONG TERM GOAL #4   Title I in HEP 12/25/15   Time 6   Period Weeks   Status On-going   PT LONG TERM GOAL #5   Title Improve FOTO  to </= 34% limitation 12/25/15   Time 6   Period Weeks   Status On-going            Plan - 11/26/15 1045    Clinical Impression Statement Pt tolerated all exercises well with minimal to no increase in pain. Pt has reported decrease in Rt shoulder pain; has met LTG #3.  Pt interested in including lower back/core exercises in future sessions.  Progressing well towards remaining goals.    Pt will benefit from skilled therapeutic intervention in order to improve on the following deficits Postural dysfunction;Improper body mechanics;Decreased range of motion;Decreased mobility;Increased fascial restricitons;Decreased endurance;Decreased activity tolerance;Pain   Rehab Potential Good   PT Frequency 2x / week   PT Duration 6 weeks   PT Treatment/Interventions Patient/family education;ADLs/Self Care Home Management;Neuromuscular re-education;Manual techniques;Dry needling;Cryotherapy;Electrical Stimulation;Moist Heat;Therapeutic exercise;Therapeutic activities;Ultrasound   PT Next Visit Plan progress with gentle stretching and postural strengthening.   Measure shoulder ROM/. Assess low back and include exercises for HEP.    Consulted and Agree with Plan of Care Patient        Problem List Patient Active Problem List   Diagnosis Date Noted  . Right shoulder pain 10/31/2015  . Dark stools 10/31/2015  . Essential hypertension, benign 03/07/2015  . Carpal tunnel syndrome, bilateral 11/29/2014  . Carotid artery dissection ([place]) 04/26/2014  . Degenerative disc disease, cervical 04/13/2014  . Perennial allergic rhinitis 11/16/2013  . Spotting 11/16/2013  . Skin tag 11/16/2013  . Obsessive compulsive  disorder 10/10/2013  . Hypothyroidism 10/10/2013  . Obesity 10/10/2013  . Annual physical exam 10/10/2013  . Lumbar degenerative disc disease 10/10/2013  . Perimenopausal 10/10/2013   Kerin Perna, PTA 11/26/2015 11:14 AM  [place], [place], [address] Phone: [phone]   Fax:  [phone]  Name: Kelsey Snow MRN: [id number] Date of Birth: January 09, 1966

## 2015-11-26 NOTE — Patient Instructions (Signed)
Arm / Leg Lift: Opposite (Prone)    Lift right leg and opposite arm __1__ inches from floor, keeping knee locked.  Reach arm towards wall and leg towards opposite wall.  Repeat _5___ times per set. Do ___2_ sets per session. Do __1__ sessions per day.  [email or web address].[email or web address]    [facility] [place], [place] [address]  [phone] (office) [phone] (fax)

## 2015-11-29 ENCOUNTER — Encounter: Payer: Self-pay | Admitting: Vascular Surgery

## 2015-11-29 ENCOUNTER — Ambulatory Visit (INDEPENDENT_AMBULATORY_CARE_PROVIDER_SITE_OTHER): Payer: BLUE CROSS/BLUE SHIELD | Admitting: Rehabilitative and Restorative Service Providers"

## 2015-11-29 ENCOUNTER — Encounter: Payer: Self-pay | Admitting: Rehabilitative and Restorative Service Providers"

## 2015-11-29 ENCOUNTER — Ambulatory Visit (HOSPITAL_COMMUNITY)
Admission: RE | Admit: 2015-11-29 | Discharge: 2015-11-29 | Disposition: A | Discharge status: Home / Self Care | Payer: BLUE CROSS/BLUE SHIELD | Source: Ambulatory Visit | Attending: Vascular Surgery | Admitting: Vascular Surgery

## 2015-11-29 ENCOUNTER — Ambulatory Visit (INDEPENDENT_AMBULATORY_CARE_PROVIDER_SITE_OTHER): Payer: BLUE CROSS/BLUE SHIELD | Admitting: Vascular Surgery

## 2015-11-29 VITALS — BP 129/77 | Ht 65.0 in | Wt 263.0 lb

## 2015-11-29 DIAGNOSIS — M545 Low back pain, unspecified: Secondary | ICD-10-CM

## 2015-11-29 DIAGNOSIS — I720 Aneurysm of carotid artery: Secondary | ICD-10-CM

## 2015-11-29 DIAGNOSIS — I7771 Dissection of carotid artery: Secondary | ICD-10-CM

## 2015-11-29 DIAGNOSIS — I6523 Occlusion and stenosis of bilateral carotid arteries: Secondary | ICD-10-CM | POA: Diagnosis not present

## 2015-11-29 DIAGNOSIS — R29898 Other symptoms and signs involving the musculoskeletal system: Secondary | ICD-10-CM

## 2015-11-29 DIAGNOSIS — M79605 Pain in left leg: Principal | ICD-10-CM

## 2015-11-29 NOTE — Therapy (Signed)
[place], [place], [address] Phone: [phone]   Fax:  [phone]  Physical Therapy Treatment  Patient Details  Name: Nebula Porten MRN: [id number] Date of Birth: 1966-03-11 Referring Provider: Dr. [name]  Encounter Date: 11/29/2015      PT End of Session - 11/29/15 1624    Visit Number 5   Number of Visits 16   Date for PT Re-Evaluation 01/09/16   PT Start Time [id number]   PT Stop Time 1503   PT Time Calculation (min) 56 min   Activity Tolerance Patient tolerated treatment well      Past Medical History  Diagnosis Date  . Thyroid disease   . Hyperlipidemia   . Obese   . Lumbosacral spondylolysis   . GERD (gastroesophageal reflux disease)   . OCD (obsessive compulsive disorder)   . Hx of ectopic pregnancy   . Anemia   . Essential hypertension, benign 03/07/2015    Past Surgical History  Procedure Laterality Date  . Tubal ligation    . Back surgery    . Ectopic pregnancy surgery    . Tubal ligation      There were no vitals filed for this visit.  Visit Diagnosis:  LBP radiating to left leg - Plan: PT plan of care cert/re-cert  Weakness of left lower extremity - Plan: PT plan of care cert/re-cert      Subjective Assessment - 11/29/15 1454    Subjective Patient reports that she has continued pain in the LB and pain into the Lt LE which has increased in the past 3-4 months and she is now experiencing LBP with burning in the Lt foot. the knotting and pain in the Lt hip and lateral thigh is worse.             [facility] PT Assessment - 11/29/15 0001    Assessment   Medical Diagnosis Rt shoulder pain/Lt LBPand Lt LE pain    Referring Provider Dr. [name]   Onset Date/Surgical Date 04/03/15   Hand Dominance Right   Next MD Visit 12/02/15   AROM   AROM Assessment Site --  pain with all trunk motions   Lumbar Flexion 70%   Lumbar Extension 30%   Lumbar - Right Side  Bend 60%   Lumbar - Left Side Bend 55%   Lumbar - Right Rotation 35%   Lumbar - Left Rotation 30%   Strength   Overall Strength Comments 5/5 Rt LE; 4+/5 Lt hip flex; 4/5 hip abd/ext/add    Flexibility   Hamstrings Rt 78 deg; Lt 72 deg    Quadriceps heel to buttock ~ 95 deg knee flex   ITB tight Lt > Rt   Piriformis tight Lt >>>RT    Palpation   Spinal mobility tight and tender L2-L5 CPA; L2/L5 Lt UPA   Palpation comment muscular tightness bilat lumbar paraspinals; QL; lats; hip abductors Lt >>> Rt                      [facility] Adult PT Treatment/Exercise - 11/29/15 0001    Exercises   Exercises Lumbar;Neck;Shoulder   Lumbar Exercises: Stretches   Passive Hamstring Stretch 2 reps;30 seconds   Press Ups --  2-3 sec x 10   ITB Stretch 2 reps;30 seconds   Piriformis Stretch 2 reps;30 seconds   Lumbar Exercises: Supine   Ab Set --  3 part core 10 sec x 10   Bridge 5  reps  10 sec hold with core    Moist Heat Therapy   Number Minutes Moist Heat 15 Minutes   Moist Heat Location Lumbar Spine;Hip   Electrical Stimulation   Electrical Stimulation Location bilat lumbar spine to Lt hip    Electrical Stimulation Action IFC   Electrical Stimulation Parameters to tolerance    Electrical Stimulation Goals Pain;Tone                PT Education - 11/29/15 1445    Education provided Yes   Education Details HEP    Person(s) Educated Patient   Methods Explanation;Demonstration;Tactile cues;Verbal cues;Handout   Comprehension Verbalized understanding;Returned demonstration;Verbal cues required;Tactile cues required             PT Long Term Goals - 11/29/15 1635    PT LONG TERM GOAL #1   Title Improve posture and alignment - with scapulae in better alignment along the thoracic wall and head of the humerus more in anatomical position 01/09/16   Time 6  added 2 weeks to POC on 11/29/15   Period Weeks   Status Revised   PT LONG TERM GOAL #2   Title Increase AROM Rt  shoulder to equal or better than Lt shoulder 01/09/16   Time 6   Period Weeks   Status Revised   PT LONG TERM GOAL #3   Title Decrease pain in LBP/L LE pain with patient reporting 50-70% less pain for 50% of the day 01/09/16   Time 6   Status Revised   PT LONG TERM GOAL #4   Title I in HEP 01/09/16   Time 6   Period Weeks   Status Revised   PT LONG TERM GOAL #5   Title Improve FOTO  to </= 34% limitation 01/09/16   Time 6   Period Weeks   Status Revised   PT LONG TERM GOAL #6   Title Improve core stability allowing pt to perform functional activities such as light household chores without significant increase in pain 01/09/16   Time 6   Period Weeks   Status New   PT LONG TERM GOAL #7   Title Patient I in HEP including core stabilization to alow her to perform exercise and ADL's without increase in radicular pain Lt LE. 01/09/16   Time 6   Period Weeks   Status New   PT LONG TERM GOAL #8   Title Increase strength Lt LE to 4+/5 to 5/5  01/09/16   Time 6   Period Weeks   Status New               Plan - 11/29/15 1625    Clinical Impression Statement Patient is evaluated for chronic LBP and Lt LE radicular pain and report of Lt LE neropathy. she had lumbar fusion 10/14 and was doing well post op until the past several months wehn she reports increase in LBP and Lt LE pain. Pt has limited trunk and LE mobility; Lt LE weakness; myofacial pain with palpation and pain limiting functional activity level. We will incoporate lumbar spine stabilization and treatment into patient's current POC.    Pt will benefit from skilled therapeutic intervention in order to improve on the following deficits Postural dysfunction;Improper body mechanics;Decreased range of motion;Decreased mobility;Increased fascial restricitons;Decreased endurance;Decreased activity tolerance;Pain   Rehab Potential Good   PT Frequency 2x / week   PT Duration 6 weeks   PT Treatment/Interventions Patient/family  education;ADLs/Self Care Home Management;Neuromuscular re-education;Manual techniques;Dry needling;Cryotherapy;Electrical Stimulation;Moist Heat;Therapeutic  exercise;Therapeutic activities;Ultrasound   PT Next Visit Plan progress with gentle stretching and postural strengthening.   Measure shoulder ROM/. Includeback rehab - stretches and core stabilization for low back as well as exercises for HEP.    PT Home Exercise Plan myofacial ball release work; HEP   Consulted and Agree with Plan of Care Patient        Problem List Patient Active Problem List   Diagnosis Date Noted  . Right shoulder pain 10/31/2015  . Dark stools 10/31/2015  . Essential hypertension, benign 03/07/2015  . Carpal tunnel syndrome, bilateral 11/29/2014  . Carotid artery dissection ([place]) 04/26/2014  . Degenerative disc disease, cervical 04/13/2014  . Perennial allergic rhinitis 11/16/2013  . Spotting 11/16/2013  . Skin tag 11/16/2013  . Obsessive compulsive disorder 10/10/2013  . Hypothyroidism 10/10/2013  . Obesity 10/10/2013  . Annual physical exam 10/10/2013  . Lumbar degenerative disc disease 10/10/2013  . Perimenopausal 10/10/2013     Nilda Simmer PT, MPH  11/29/2015, 4:45 PM  [facility] [place], [place], [address] Phone: [phone]   Fax:  [phone]  Name: Natanya Bicknell MRN: [id number] Date of Birth: 29-Jun-1966

## 2015-11-29 NOTE — Progress Notes (Signed)
Established Carotid Patient   History of Present Illness  Kelsey Snow is a 50 y.o. (11-Sep-1966) female who presents with chief complaint: routine follow-up.  Previous carotid studies demonstrated: RICA [id number] stenosis, LICA resolved dissection.  Patient has no history of TIA or stroke symptom.  The patient has never had amaurosis fugax or monocular blindness.  The patient has never had facial drooping or hemiplegia.  The patient has never had receptive or expressive aphasia.     The patient's PMH, PSH, SH, and FamHx are unchanged from 11/23/14.  Current Outpatient Prescriptions  Medication Sig Dispense Refill  . aspirin 81 MG EC tablet TAKE 1 TABLET (81 MG TOTAL) BY MOUTH DAILY. 90 tablet 3  . Cholecalciferol (VITAMIN D3) 2000 UNITS TABS Take by mouth 2 (two) times a week. TAKE 2 TABLETS Sunday AND 1 TABLET Tuesday AND Thursday.    . escitalopram (LEXAPRO) 20 MG tablet Take 1 tablet (20 mg total) by mouth daily. 90 tablet 3  . levothyroxine (SYNTHROID, LEVOTHROID) 112 MCG tablet TAKE 1 TABLET (112 MCG TOTAL) BY MOUTH DAILY BEFORE BREAKFAST. 30 tablet 0  . Mag Aspart-Potassium Aspart (POTASSIUM & MAGNESIUM ASPARTAT) 250-250 MG CAPS Take by mouth. TAKE 2 CAPSULES Tuesday  AND Thursday  MORNING    . omeprazole (PRILOSEC) 40 MG capsule TAKE 1 CAPSULE (40 MG TOTAL) BY MOUTH DAILY. 30 capsule 2  . oxyCODONE-acetaminophen (PERCOCET/ROXICET) 5-325 MG tablet Take by mouth.    . rosuvastatin (CRESTOR) 10 MG tablet Take 1 tablet (10 mg total) by mouth daily. 90 tablet 3  . TraMADol HCl 300 MG CP24 Take 1 capsule by mouth daily. 90 capsule 0  . traZODone (DESYREL) 100 MG tablet 1-2 tabs by mouth daily at bedtime 90 tablet 3  . ferrous sulfate 325 (65 FE) MG tablet Take 325 mg by mouth 3 (three) times a week. Reported on 11/29/2015    . meloxicam (MOBIC) 15 MG tablet One tab PO qAM with breakfast for 2 weeks, then daily prn pain. (Patient not taking: Reported on 11/13/2015) 30 tablet 3  . Multiple  Vitamins-Minerals (HAIR/SKIN/NAILS PO) Take by mouth 2 (two) times a week. Reported on 11/29/2015     No current facility-administered medications for this visit.    Allergies  Allergen Reactions  . Bee Venom     On ROS today: no TIA or stroke sx, no longer on coumadin   Physical Examination  Filed Vitals:   11/29/15 0928 11/29/15 0932  BP: 146/78 129/77  Height: 5\' 5"  (1.651 m)   Weight: 263 lb (119.296 kg)   SpO2: 98%    Body mass index is 43.77 kg/(m^2).  General: A&O x 3, WD, Obese,   Eyes: PERRLA, EOMI  Neck: Supple, no nuchal rigidity, no palpable LAD  Pulmonary: Sym exp, good air movt, CTAB, no rales, rhonchi, & wheezing  Cardiac: RRR, Nl S1, S2, no Murmurs, rubs or gallops  Vascular: Carotid Palpable, without bruit Palpable, without bruit   Gastrointestinal: soft, NTND, no G/R, no HSM, no masses, no CVAT B  Musculoskeletal: M/S 5/5 throughout , Extremities without ischemic changes   Neurologic: CN grossly intact , Pain and light touch intact in extremities , Motor exam as listed above   Non-Invasive Vascular Imaging  CAROTID DUPLEX (Date: 11/29/2015 ):   R ICA stenosis: 40-59% by velocity, some suggestion of FMD, no disease visualized  R VA: patent and antegrade  L ICA stenosis: 40-59% by velocity, no disease visualized  L VA:  patent and antegrade  Medical Decision Making  Kelsey Snow is a 50 y.o. 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  I discussed in depth with the patient the nature of atherosclerosis, and emphasized the importance of maximal medical management including strict control of blood pressure, blood glucose, and lipid levels, antiplatelet agents, obtaining regular exercise, and cessation of smoking.    The patient is aware that without maximal medical management the underlying  atherosclerotic disease process will progress, limiting the benefit of any interventions. The patient is currently on a statin: Crestor. The patient is currently not on an anti-platelet.  The patient will be started on ASA 81 mg PO daily.  Thank you for allowing [place] to participate in this patient's care.   Adele Barthel, MD [facility] Office: [phone] Pager: [phone]  11/29/2015, 12:07 PM

## 2015-11-29 NOTE — Patient Instructions (Signed)
Abdominal Bracing With Pelvic Floor (Hook-Lying)    With neutral spine, tighten pelvic floor and abdominals sucking belly button to back bone; tighten muscles in back at waist. Hold 10  Repeat _10__ times. Do _several__ times a day. Progress to do this exercise in sitting; standing; walking and with functional activities     Engage core for bridging exercises and all of your leg exercises  Avoid trunk rotation   Piriformis Stretch    Lying on back, pull right knee across body. Hold __30__ seconds. Repeat _3___ times. Do __2-3__ sessions per day.

## 2015-12-02 ENCOUNTER — Ambulatory Visit (INDEPENDENT_AMBULATORY_CARE_PROVIDER_SITE_OTHER): Payer: BLUE CROSS/BLUE SHIELD | Admitting: Sports Medicine

## 2015-12-02 ENCOUNTER — Encounter: Payer: Self-pay | Admitting: Sports Medicine

## 2015-12-02 VITALS — BP 144/78 | HR 83 | Resp 18 | Wt 264.0 lb

## 2015-12-02 DIAGNOSIS — L918 Other hypertrophic disorders of the skin: Secondary | ICD-10-CM | POA: Diagnosis not present

## 2015-12-02 NOTE — Progress Notes (Signed)
  Subjective:    CC: Skin lesions  HPI: For years this pleasant 50 year old female has had lesions on her face, small bumps, these can be removed. Symptoms are moderate, persistent, no pain, occasionally they grow a bit of hair.  Past medical history, Surgical history, Family history not pertinant except as noted below, Social history, Allergies, and medications have been entered into the medical record, reviewed, and no changes needed.   Review of Systems: No fevers, chills, night sweats, weight loss, chest pain, or shortness of breath.   Objective:    General: Well Developed, well nourished, and in no acute distress.  Neuro: Alert and oriented x3, extra-ocular muscles intact, sensation grossly intact.  HEENT: Normocephalic, atraumatic, pupils equal round reactive to light, neck supple, no masses, no lymphadenopathy, thyroid nonpalpable. There are several moles/skin tags on her face, 2 on the right side and 4 on the left side. Skin: Warm and dry, no rashes. Cardiac: Regular rate and rhythm, no murmurs rubs or gallops, no lower extremity edema.  Respiratory: Clear to auscultation bilaterally. Not using accessory muscles, speaking in full sentences.  Procedure:  Cryodestruction of 6 skin tags/moles on the face Consent obtained and verified. Time-out conducted. Noted no overlying erythema, induration, or other signs of local infection. Completed without difficulty using Cryo-Gun. Advised to call if fevers/chills, erythema, induration, drainage, or persistent bleeding.  Impression and Recommendations:

## 2015-12-02 NOTE — Assessment & Plan Note (Signed)
Cryotherapy of several skin tags, 4 on the left side of the face and 2 on the right side. Return to see me in 2 weeks.

## 2015-12-04 ENCOUNTER — Encounter: Payer: Self-pay | Admitting: Rehabilitative and Restorative Service Providers"

## 2015-12-06 ENCOUNTER — Encounter: Payer: Self-pay | Admitting: Rehabilitative and Restorative Service Providers"

## 2015-12-09 ENCOUNTER — Encounter: Payer: Self-pay | Admitting: Physical Therapy

## 2015-12-09 ENCOUNTER — Other Ambulatory Visit: Payer: Self-pay | Admitting: Sports Medicine

## 2015-12-11 ENCOUNTER — Encounter: Payer: Self-pay | Admitting: Physical Therapy

## 2015-12-12 NOTE — Telephone Encounter (Signed)
Change in Therapy patient no longer takes this medication. - CF

## 2015-12-13 ENCOUNTER — Encounter: Payer: Self-pay | Admitting: Rehabilitative and Restorative Service Providers"

## 2015-12-16 ENCOUNTER — Ambulatory Visit (INDEPENDENT_AMBULATORY_CARE_PROVIDER_SITE_OTHER): Payer: BLUE CROSS/BLUE SHIELD | Admitting: Physical Therapy

## 2015-12-16 ENCOUNTER — Ambulatory Visit (INDEPENDENT_AMBULATORY_CARE_PROVIDER_SITE_OTHER): Payer: BLUE CROSS/BLUE SHIELD | Admitting: Sports Medicine

## 2015-12-16 VITALS — BP 119/76 | HR 61 | Resp 16 | Wt 264.4 lb

## 2015-12-16 DIAGNOSIS — M623 Immobility syndrome (paraplegic): Secondary | ICD-10-CM

## 2015-12-16 DIAGNOSIS — M545 Low back pain: Secondary | ICD-10-CM

## 2015-12-16 DIAGNOSIS — M79605 Pain in left leg: Principal | ICD-10-CM

## 2015-12-16 DIAGNOSIS — M25511 Pain in right shoulder: Secondary | ICD-10-CM | POA: Diagnosis not present

## 2015-12-16 DIAGNOSIS — R293 Abnormal posture: Secondary | ICD-10-CM | POA: Diagnosis not present

## 2015-12-16 DIAGNOSIS — Z7409 Other reduced mobility: Secondary | ICD-10-CM

## 2015-12-16 DIAGNOSIS — R29898 Other symptoms and signs involving the musculoskeletal system: Secondary | ICD-10-CM

## 2015-12-16 DIAGNOSIS — L918 Other hypertrophic disorders of the skin: Secondary | ICD-10-CM

## 2015-12-16 DIAGNOSIS — M2560 Stiffness of unspecified joint, not elsewhere classified: Secondary | ICD-10-CM

## 2015-12-16 NOTE — Therapy (Signed)
[place], [place], [address] Phone: [phone]   Fax:  [phone]  Physical Therapy Treatment  Patient Details  Name: Kelsey Snow MRN: [id number] Date of Birth: [birth year] Referring Provider: Dr. Helane Rima  Encounter Date: 12/16/2015      PT End of Session - 12/16/15 1023    Visit Number 6   Number of Visits 16   Date for PT Re-Evaluation 01/09/16   PT Start Time 1018   PT Stop Time 1119   PT Time Calculation (min) 61 min      Past Medical History  Diagnosis Date  . Thyroid disease   . Hyperlipidemia   . Obese   . Lumbosacral spondylolysis   . GERD (gastroesophageal reflux disease)   . OCD (obsessive compulsive disorder)   . Hx of ectopic pregnancy   . Anemia   . Essential hypertension, benign 03/07/2015    Past Surgical History  Procedure Laterality Date  . Tubal ligation    . Back surgery    . Ectopic pregnancy surgery    . Tubal ligation      There were no vitals filed for this visit.  Visit Diagnosis:  LBP radiating to left leg  Weakness of left lower extremity  Pain in shoulder region, right  Abnormal posture  Stiffness due to immobility  Decreased functional mobility and endurance      Subjective Assessment - 12/16/15 1023    Subjective Pt reports she has been taking yoga class and feels it has helped her upper back and Rt shoulder.  Has done some modifications with poses.    Currently in Pain? Yes   Pain Score 6    Pain Location Back   Pain Orientation Left   Pain Radiating Towards to back of Lt knee    Aggravating Factors  reaching, lifting, bending   Pain Relieving Factors back work outs and leg stretches             [facility] PT Assessment - 12/16/15 0001    Assessment   Medical Diagnosis Rt shoulder pain/Lt LBPand Lt LE pain    Referring Provider Dr. Helane Rima   Onset Date/Surgical Date 04/03/15   Hand Dominance Right   Next MD Visit  12/16/15          [facility] Adult PT Treatment/Exercise - 12/16/15 0001    Lumbar Exercises: Stretches   Passive Hamstring Stretch 2 reps;30 seconds   ITB Stretch 2 reps;30 seconds   Lumbar Exercises: Aerobic   Stationary Bike L3: 5 min    Lumbar Exercises: Supine   Other Supine Lumbar Exercises Hooklying on full foam roll:  snow angels to tolerance x 10 reps; marching x 10; dead bug (opp arm and leg) x 15    Shoulder Exercises: Standing   Other Standing Exercises D2 sash with yellow x 10 reps x 2 sets each arm    Shoulder Exercises: ROM/Strengthening   UBE (Upper Arm Bike) L2: 2 min forward/ 1 min backward.    Moist Heat Therapy   Number Minutes Moist Heat 15 Minutes   Moist Heat Location Lumbar Spine;Hip   Electrical Stimulation   Electrical Stimulation Location bilat lumbar spine to Lt hip    Electrical Stimulation Action IFC    Electrical Stimulation Parameters to tolerance    Electrical Stimulation Goals Tone;Pain      Other shoulder strengthening:  2 sets of 10 rowing with green band; 2 sets of 10 of shoulder ext with  red band - VC for core engagement and upright posture.         PT Long Term Goals - 11/29/15 1635    PT LONG TERM GOAL #1   Title Improve posture and alignment - with scapulae in better alignment along the thoracic wall and head of the humerus more in anatomical position 01/09/16   Time 6  added 2 weeks to POC on 11/29/15   Period Weeks   Status Revised   PT LONG TERM GOAL #2   Title Increase AROM Rt shoulder to equal or better than Lt shoulder 01/09/16   Time 6   Period Weeks   Status Revised   PT LONG TERM GOAL #3   Title Decrease pain in LBP/L LE pain with patient reporting 50-70% less pain for 50% of the day 01/09/16   Time 6   Status Revised   PT LONG TERM GOAL #4   Title I in HEP 01/09/16   Time 6   Period Weeks   Status Revised   PT LONG TERM GOAL #5   Title Improve FOTO  to </= 34% limitation 01/09/16   Time 6   Period Weeks   Status Revised    PT LONG TERM GOAL #6   Title Improve core stability allowing pt to perform functional activities such as light household chores without significant increase in pain 01/09/16   Time 6   Period Weeks   Status New   PT LONG TERM GOAL #7   Title Patient I in HEP including core stabilization to alow her to perform exercise and ADL's without increase in radicular pain Lt LE. 01/09/16   Time 6   Period Weeks   Status New   PT LONG TERM GOAL #8   Title Increase strength Lt LE to 4+/5 to 5/5  01/09/16   Time 6   Period Weeks   Status New               Plan - 12/16/15 1106    Clinical Impression Statement Pt experiencing less Rt shoulder pain. Tolerated standing shoulder strengthening exercises without increase in pain, only fatigue.  Pt continues with low back pain and limited trunk mobility.  Gradual progress towards goals.,    Pt will benefit from skilled therapeutic intervention in order to improve on the following deficits Postural dysfunction;Improper body mechanics;Decreased range of motion;Decreased mobility;Increased fascial restricitons;Decreased endurance;Decreased activity tolerance;Pain   Rehab Potential Good   PT Frequency 2x / week   PT Duration 6 weeks   PT Treatment/Interventions Patient/family education;ADLs/Self Care Home Management;Neuromuscular re-education;Manual techniques;Dry needling;Cryotherapy;Electrical Stimulation;Moist Heat;Therapeutic exercise;Therapeutic activities;Ultrasound   PT Next Visit Plan progress with gentle stretching and postural strengthening. Includeback rehab - stretches and core stabilization for low back as well as exercises for HEP.    Consulted and Agree with Plan of Care Patient        Problem List Patient Active Problem List   Diagnosis Date Noted  . Right shoulder pain 10/31/2015  . Dark stools 10/31/2015  . Essential hypertension, benign 03/07/2015  . Carpal tunnel syndrome, bilateral 11/29/2014  . Carotid artery dissection  ([place]) 04/26/2014  . Degenerative disc disease, cervical 04/13/2014  . Perennial allergic rhinitis 11/16/2013  . Spotting 11/16/2013  . Skin tag 11/16/2013  . Obsessive compulsive disorder 10/10/2013  . Hypothyroidism 10/10/2013  . Obesity 10/10/2013  . Annual physical exam 10/10/2013  . Lumbar degenerative disc disease 10/10/2013  . Perimenopausal 10/10/2013   Kerin Perna, PTA 12/16/2015 11:59 AM  [facility] [place], [place], [address] Phone: [phone]   Fax:  [phone]  Name: Kelsey Snow MRN: [id number] Date of Birth: 1966/03/16

## 2015-12-16 NOTE — Progress Notes (Signed)
  Subjective:    CC: Recheck skin tags  HPI: Good response to prior cryotherapy 2 weeks ago, needs a repeat, there is still some mild persistence of the skin tags.  Past medical history, Surgical history, Family history not pertinant except as noted below, Social history, Allergies, and medications have been entered into the medical record, reviewed, and no changes needed.   Review of Systems: No fevers, chills, night sweats, weight loss, chest pain, or shortness of breath.   Objective:    General: Well Developed, well nourished, and in no acute distress.  Neuro: Alert and oriented x3, extra-ocular muscles intact, sensation grossly intact.  HEENT: Normocephalic, atraumatic, pupils equal round reactive to light, neck supple, no masses, no lymphadenopathy, thyroid nonpalpable.  Skin: Warm and dry, no rashes. Cardiac: Regular rate and rhythm, no murmurs rubs or gallops, no lower extremity edema.  Respiratory: Clear to auscultation bilaterally. Not using accessory muscles, speaking in full sentences.  Procedure:  Cryodestruction of 6 skin tags/moles on the face Consent obtained and verified. Time-out conducted. Noted no overlying erythema, induration, or other signs of local infection. Completed without difficulty using Cryo-Gun. Advised to call if fevers/chills, erythema, induration, drainage, or persistent bleeding  Impression and Recommendations:

## 2015-12-16 NOTE — Assessment & Plan Note (Signed)
Repeat cryotherapy of a single skin tag on the right lip, and 4 on the left side of the cheek.  Return in 2 weeks

## 2015-12-18 ENCOUNTER — Ambulatory Visit (INDEPENDENT_AMBULATORY_CARE_PROVIDER_SITE_OTHER): Payer: BLUE CROSS/BLUE SHIELD | Admitting: Physical Therapy

## 2015-12-18 DIAGNOSIS — R29898 Other symptoms and signs involving the musculoskeletal system: Secondary | ICD-10-CM

## 2015-12-18 DIAGNOSIS — M623 Immobility syndrome (paraplegic): Secondary | ICD-10-CM

## 2015-12-18 DIAGNOSIS — R293 Abnormal posture: Secondary | ICD-10-CM | POA: Diagnosis not present

## 2015-12-18 DIAGNOSIS — M2560 Stiffness of unspecified joint, not elsewhere classified: Secondary | ICD-10-CM

## 2015-12-18 DIAGNOSIS — Z7409 Other reduced mobility: Secondary | ICD-10-CM

## 2015-12-18 DIAGNOSIS — M79605 Pain in left leg: Principal | ICD-10-CM

## 2015-12-18 DIAGNOSIS — M545 Low back pain, unspecified: Secondary | ICD-10-CM

## 2015-12-18 NOTE — Therapy (Addendum)
[place], [place], [address] Phone: [phone]   Fax:  [phone]  Physical Therapy Treatment  Patient Details  Name: Kelsey Snow MRN: [id number] Date of Birth: [birth year] Referring Provider: Dr. Helane Rima   Encounter Date: 12/18/2015      PT End of Session - 12/18/15 1532    Visit Number 7   Number of Visits 16   Date for PT Re-Evaluation 01/09/16   PT Start Time 1519   PT Stop Time 1612   PT Time Calculation (min) 53 min   Activity Tolerance Patient tolerated treatment well      Past Medical History  Diagnosis Date  . Thyroid disease   . Hyperlipidemia   . Obese   . Lumbosacral spondylolysis   . GERD (gastroesophageal reflux disease)   . OCD (obsessive compulsive disorder)   . Hx of ectopic pregnancy   . Anemia   . Essential hypertension, benign 03/07/2015    Past Surgical History  Procedure Laterality Date  . Tubal ligation    . Back surgery    . Ectopic pregnancy surgery    . Tubal ligation      There were no vitals filed for this visit.  Visit Diagnosis:  LBP radiating to left leg  Weakness of left lower extremity  Abnormal posture  Stiffness due to immobility      Subjective Assessment - 12/18/15 1532    Subjective Pt reports she is sore in bilat UE and LE from yoga class yesterday.             [facility] PT Assessment - 12/18/15 0001    Assessment   Medical Diagnosis Rt shoulder pain/Lt LBPand Lt LE pain    Referring Provider Dr. Helane Rima    Onset Date/Surgical Date 04/03/15   Hand Dominance Right   Next MD Visit PRN         [facility] Adult PT Treatment/Exercise - 12/18/15 0001    Lumbar Exercises: Aerobic   Stationary Bike NuStep L4: 7 min (arms and legs)   Lumbar Exercises: Supine   Ab Set 5 reps;5 seconds   Clam 10 reps  with ab set, each leg    Heel Slides 10 reps  each leg with ab set   Bridge 10 reps  10 sec hold with core    Other  Supine Lumbar Exercises Hooklying on full foam roll:  snow angels to tolerance x 10 reps; marching x 10; dead bug (opp arm and leg) x 15    Shoulder Exercises: Supine   Horizontal ABduction Strengthening;Both;10 reps   Theraband Level (Shoulder Horizontal ABduction) Level 3 (Green)   External Rotation Strengthening;10 reps;Theraband;Both  2 sets   Other Supine Exercises Sash with greem band x 10 reps each side x 2 sets    Moist Heat Therapy   Number Minutes Moist Heat 15 Minutes   Moist Heat Location Lumbar Spine;Hip   Electrical Stimulation   Electrical Stimulation Location bilat lumbar paraspinals    Electrical Stimulation Action IFC   Electrical Stimulation Parameters to tolerance    Electrical Stimulation Goals Tone;Pain                PT Education - 12/18/15 1651    Education provided Yes   Education Details HEP - transverse ab series   Person(s) Educated Patient   Methods Handout;Explanation   Comprehension Verbalized understanding;Returned demonstration             PT Long Term  Goals - 11/29/15 1635    PT LONG TERM GOAL #1   Title Improve posture and alignment - with scapulae in better alignment along the thoracic wall and head of the humerus more in anatomical position 01/09/16   Time 6  added 2 weeks to POC on 11/29/15   Period Weeks   Status Revised   PT LONG TERM GOAL #2   Title Increase AROM Rt shoulder to equal or better than Lt shoulder 01/09/16   Time 6   Period Weeks   Status Revised   PT LONG TERM GOAL #3   Title Decrease pain in LBP/L LE pain with patient reporting 50-70% less pain for 50% of the day 01/09/16   Time 6   Status Revised   PT LONG TERM GOAL #4   Title I in HEP 01/09/16   Time 6   Period Weeks   Status Revised   PT LONG TERM GOAL #5   Title Improve FOTO  to </= 34% limitation 01/09/16   Time 6   Period Weeks   Status Revised   PT LONG TERM GOAL #6   Title Improve core stability allowing pt to perform functional activities  such as light household chores without significant increase in pain 01/09/16   Time 6   Period Weeks   Status New   PT LONG TERM GOAL #7   Title Patient I in HEP including core stabilization to alow her to perform exercise and ADL's without increase in radicular pain Lt LE. 01/09/16   Time 6   Period Weeks   Status New   PT LONG TERM GOAL #8   Title Increase strength Lt LE to 4+/5 to 5/5  01/09/16   Time 6   Period Weeks   Status New               Plan - 12/18/15 1648    Clinical Impression Statement Pt tolerated new exercises without increase in pain in LB or shoulder. Pt reported decreased LBP with use of MHP and estim at end of session. Pt making gradual progress towards goals.    Pt will benefit from skilled therapeutic intervention in order to improve on the following deficits Postural dysfunction;Improper body mechanics;Decreased range of motion;Decreased mobility;Increased fascial restricitons;Decreased endurance;Decreased activity tolerance;Pain   Rehab Potential Good   PT Frequency 2x / week   PT Duration 6 weeks   PT Treatment/Interventions Patient/family education;ADLs/Self Care Home Management;Neuromuscular re-education;Manual techniques;Dry needling;Cryotherapy;Electrical Stimulation;Moist Heat;Therapeutic exercise;Therapeutic activities;Ultrasound   PT Next Visit Plan Continue progressive postural strengthening and stretching.  add prone pelvic press to HEP if tolerated at next visit.    Consulted and Agree with Plan of Care Patient        Problem List Patient Active Problem List   Diagnosis Date Noted  . Right shoulder pain 10/31/2015  . Dark stools 10/31/2015  . Essential hypertension, benign 03/07/2015  . Carpal tunnel syndrome, bilateral 11/29/2014  . 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 Degenerative disc disease, cervical 04/13/2014  . Perennial allergic rhinitis 11/16/2013  . Spotting 11/16/2013  . Skin tag 11/16/2013  . Obsessive compulsive  disorder 10/10/2013  . Hypothyroidism 10/10/2013  . Obesity 10/10/2013  . Annual physical exam 10/10/2013  . Lumbar degenerative disc disease 10/10/2013  . Perimenopausal 10/10/2013    Kerin Perna, PTA 12/18/2015 4:52 PM  [facility] [place], [place], [address] Phone: [phone]   Fax:  [phone]  Name: Kelsey Snow  MRN: [id number] Date of Birth: Feb 28, 1966    PHYSICAL THERAPY DISCHARGE SUMMARY  Visits from Start of Care: 7  Current functional level related to goals / functional outcomes: Improved functional activity level. Pt is now participating in yoga classes and feels confident in continuing with I HEP. Dr. Darene Lamer said she could quit PT per pt report.    Remaining deficits: Some continued limitations in mobility and some continued pain   Education / Equipment: HEP  Plan: Patient agrees to discharge.  Patient goals were partially met. Patient is being discharged due to being pleased with the current functional level.  ?????   Celyn P. Helene Kelp PT, MPH 12/31/2015 8:25 AM

## 2015-12-18 NOTE — Patient Instructions (Signed)
  Abdominal Bracing With Pelvic Floor (Hook-Lying)   With neutral spine, tighten pelvic floor and abdominals. Hold 10 seconds. Repeat __10_ times. Do _1__ times a day.   Knee to Chest: Transverse Plane Stability   Bring one knee up, then return. Be sure pelvis does not roll side to side. Keep pelvis still. Lift knee __10_ times each leg. Restabilize pelvis. Repeat with other leg. Do _1-2__ sets, _1__ times per day.   Hip External Rotation With Pillow: Transverse Plane Stability   One knee bent, one leg straight, on pillow. Slowly roll bent knee out. Be sure pelvis does not rotate. Do _10__ times. Restabilize pelvis. Repeat with other leg. Do _1-2__ sets, _1__ times per day.  Heel Slide: 4-10 Inches - Transverse Plane Stability   Slide heel 4 inches down. Be sure pelvis does not rotate. Do _10__ times. Restabilize pelvis. Repeat with other leg. Do __1_ sets, _1__ times per day.   [facility] [address] [place], [place] [address]  [phone] (office) [phone] (fax)   

## 2015-12-25 ENCOUNTER — Encounter: Payer: Self-pay | Admitting: Physical Therapy

## 2015-12-30 ENCOUNTER — Encounter: Payer: Self-pay | Admitting: Sports Medicine

## 2015-12-30 ENCOUNTER — Ambulatory Visit (INDEPENDENT_AMBULATORY_CARE_PROVIDER_SITE_OTHER): Payer: BLUE CROSS/BLUE SHIELD | Admitting: Sports Medicine

## 2015-12-30 VITALS — BP 129/84 | HR 76 | Resp 18 | Wt 265.4 lb

## 2015-12-30 DIAGNOSIS — L918 Other hypertrophic disorders of the skin: Secondary | ICD-10-CM

## 2015-12-30 NOTE — Progress Notes (Signed)
  Subjective:    CC: Recheck skin tags  HPI: Good response to prior cryotherapy 2 weeks ago, needs a repeat, there is still some mild persistence of the skin tags.  Past medical history, Surgical history, Family history not pertinant except as noted below, Social history, Allergies, and medications have been entered into the medical record, reviewed, and no changes needed.   Review of Systems: No fevers, chills, night sweats, weight loss, chest pain, or shortness of breath.   Objective:    General: Well Developed, well nourished, and in no acute distress.  Neuro: Alert and oriented x3, extra-ocular muscles intact, sensation grossly intact.  HEENT: Normocephalic, atraumatic, pupils equal round reactive to light, neck supple, no masses, no lymphadenopathy, thyroid nonpalpable.  Skin: Warm and dry, no rashes. Cardiac: Regular rate and rhythm, no murmurs rubs or gallops, no lower extremity edema.  Respiratory: Clear to auscultation bilaterally. Not using accessory muscles, speaking in full sentences.  Procedure:  Cryodestruction of 5 skin tags/moles on the face Consent obtained and verified. Time-out conducted. Noted no overlying erythema, induration, or other signs of local infection. Completed without difficulty using Cryo-Gun. Advised to call if fevers/chills, erythema, induration, drainage, or persistent bleeding  Impression and Recommendations:

## 2015-12-30 NOTE — Assessment & Plan Note (Signed)
Repeat cryotherapy of 5 skin tags on the face, doing well.

## 2016-01-04 ENCOUNTER — Other Ambulatory Visit: Payer: Self-pay | Admitting: Sports Medicine

## 2016-01-14 ENCOUNTER — Other Ambulatory Visit: Payer: Self-pay | Admitting: Sports Medicine

## 2016-01-14 DIAGNOSIS — M5030 Other cervical disc degeneration, unspecified cervical region: Secondary | ICD-10-CM

## 2016-01-14 MED ORDER — TRAMADOL HCL (ER BIPHASIC) 300 MG PO CP24: 1.0000 | ORAL_CAPSULE | Freq: Every day | ORAL | Status: DC

## 2016-01-20 ENCOUNTER — Encounter: Payer: Self-pay | Admitting: Sports Medicine

## 2016-01-29 ENCOUNTER — Ambulatory Visit: Payer: Self-pay | Admitting: Sports Medicine

## 2016-02-02 ENCOUNTER — Other Ambulatory Visit: Payer: Self-pay | Admitting: Sports Medicine

## 2016-02-04 ENCOUNTER — Ambulatory Visit (INDEPENDENT_AMBULATORY_CARE_PROVIDER_SITE_OTHER): Payer: BLUE CROSS/BLUE SHIELD | Admitting: Sports Medicine

## 2016-02-04 ENCOUNTER — Encounter: Payer: Self-pay | Admitting: Sports Medicine

## 2016-02-04 VITALS — BP 137/79 | HR 66 | Resp 18 | Wt 259.5 lb

## 2016-02-04 DIAGNOSIS — L918 Other hypertrophic disorders of the skin: Secondary | ICD-10-CM | POA: Diagnosis not present

## 2016-02-04 NOTE — Assessment & Plan Note (Signed)
3 skin tags on the left side of the face as well as one on her back were frozen today, billing will be for time rather than the procedure.

## 2016-02-04 NOTE — Progress Notes (Signed)
  Subjective:    CC: skin tag  HPI: Needs additional cryotherapy  Past medical history, Surgical history, Family history not pertinant except as noted below, Social history, Allergies, and medications have been entered into the medical record, reviewed, and no changes needed.   Review of Systems: No fevers, chills, night sweats, weight loss, chest pain, or shortness of breath.   Objective:    General: Well Developed, well nourished, and in no acute distress.  Neuro: Alert and oriented x3, extra-ocular muscles intact, sensation grossly intact.  HEENT: Normocephalic, atraumatic, pupils equal round reactive to light, neck supple, no masses, no lymphadenopathy, thyroid nonpalpable.  Skin: Warm and dry, no rashes. Cardiac: Regular rate and rhythm, no murmurs rubs or gallops, no lower extremity edema.  Respiratory: Clear to auscultation bilaterally. Not using accessory muscles, speaking in full sentences.  Procedure:  Cryodestruction of 4 skin tags Consent obtained and verified. Time-out conducted. Noted no overlying erythema, induration, or other signs of local infection. Completed without difficulty using Cryo-Gun. Advised to call if fevers/chills, erythema, induration, drainage, or persistent bleeding.  Impression and Recommendations:    I spent 25 minutes with this patient, greater than 50% was face-to-face time counseling regarding the above diagnoses

## 2016-02-09 ENCOUNTER — Other Ambulatory Visit: Payer: Self-pay | Admitting: Sports Medicine

## 2016-03-02 ENCOUNTER — Encounter: Payer: Self-pay | Admitting: Sports Medicine

## 2016-03-05 ENCOUNTER — Other Ambulatory Visit: Payer: Self-pay | Admitting: Sports Medicine

## 2016-03-09 ENCOUNTER — Encounter: Payer: Self-pay | Admitting: Sports Medicine

## 2016-03-09 ENCOUNTER — Ambulatory Visit (INDEPENDENT_AMBULATORY_CARE_PROVIDER_SITE_OTHER): Payer: BLUE CROSS/BLUE SHIELD | Admitting: Sports Medicine

## 2016-03-09 VITALS — BP 136/79 | HR 62 | Resp 16 | Wt 259.0 lb

## 2016-03-09 DIAGNOSIS — G5603 Carpal tunnel syndrome, bilateral upper limbs: Secondary | ICD-10-CM

## 2016-03-09 NOTE — Assessment & Plan Note (Signed)
Repeat right median nerve hydrodissection as above, previous injection was in August of last year. She does have some sensations radiating proximal to the elbow suggestive of a cervical process versus thoracic outlet syndrome. She does have a pain doctor that will manage this. We will do the injection today for diagnostic and therapeutic purposes.

## 2016-03-09 NOTE — Progress Notes (Signed)
  Subjective:    CC: Follow-up  HPI: Right carpal tunnel syndrome: Recurrence of pain, desires repeat injection. She also has some paresthesias around her shoulder blade. She was seen by her pain providers, they referred her back to me to treat her carpal tunnel syndrome before proceeding any further with her cervical degenerative disc disease. Symptoms are moderate, persistent, paresthesias into the second, third, fourth fingers. Occasional radiation to the volar forearm, and occasional paresthesias in the outer upper shoulders.  Past medical history, Surgical history, Family history not pertinant except as noted below, Social history, Allergies, and medications have been entered into the medical record, reviewed, and no changes needed.   Review of Systems: No fevers, chills, night sweats, weight loss, chest pain, or shortness of breath.   Objective:    General: Well Developed, well nourished, and in no acute distress.  Neuro: Alert and oriented x3, extra-ocular muscles intact, sensation grossly intact.  HEENT: Normocephalic, atraumatic, pupils equal round reactive to light, neck supple, no masses, no lymphadenopathy, thyroid nonpalpable.  Skin: Warm and dry, no rashes. Cardiac: Regular rate and rhythm, no murmurs rubs or gallops, no lower extremity edema.  Respiratory: Clear to auscultation bilaterally. Not using accessory muscles, speaking in full sentences.  Procedure: Real-time Ultrasound Guided Injection of right carpal tunnel Device: GE Logiq E  Verbal informed consent obtained.  Time-out conducted.  Noted no overlying erythema, induration, or other signs of local infection.  Skin prepped in a sterile fashion.  Local anesthesia: Topical Ethyl chloride.  With sterile technique and under real time ultrasound guidance:  25-gauge needle advanced into the carpal tunnel, medication was injected both superficial to and deep to the median nerve, drainage from straining structures, the  needle was then redirected deep into the carpal tunnel and additional medication was injected. A total of 1 mL kenalog 40, 5 mL lidocaine was injected. Completed without difficulty  Pain immediately resolved suggesting accurate placement of the medication.  Advised to call if fevers/chills, erythema, induration, drainage, or persistent bleeding.  Images permanently stored and available for review in the ultrasound unit.  Impression: Technically successful ultrasound guided injection.  Impression and Recommendations:

## 2016-03-23 ENCOUNTER — Encounter: Payer: Self-pay | Admitting: Sports Medicine

## 2016-03-30 ENCOUNTER — Encounter: Payer: Self-pay | Admitting: Sports Medicine

## 2016-03-30 DIAGNOSIS — R5382 Chronic fatigue, unspecified: Secondary | ICD-10-CM

## 2016-04-04 LAB — COMPREHENSIVE METABOLIC PANEL
ALT: 24 U/L (ref 6–29)
Alkaline Phosphatase: 37 U/L (ref 33–115)
BUN: 13 mg/dL (ref 7–25)
CO2: 26 mmol/L (ref 20–31)
Calcium: 9.4 mg/dL (ref 8.6–10.2)
Chloride: 102 mmol/L (ref 98–110)
Glucose, Bld: 91 mg/dL (ref 65–99)
Potassium: 4.9 mmol/L (ref 3.5–5.3)

## 2016-04-04 LAB — LIPID PANEL
Cholesterol: 193 mg/dL (ref 125–200)
HDL: 90 mg/dL (ref >=46)
LDL Cholesterol: 77 mg/dL (ref <130)
Total CHOL/HDL Ratio: 2.1 Ratio (ref <=5.0)
Triglycerides: 130 mg/dL (ref <150)
VLDL: 26 mg/dL (ref <30)

## 2016-04-04 LAB — HEMOGLOBIN A1C
Hgb A1c MFr Bld: 6 % — ABNORMAL HIGH (ref <5.7)
Mean Plasma Glucose: 126 mg/dL

## 2016-04-04 LAB — CBC
HCT: 42.4 % (ref 35.0–45.0)
Hemoglobin: 14 g/dL (ref 11.7–15.5)
MCH: 30.6 pg (ref 27.0–33.0)
MCHC: 33 g/dL (ref 32.0–36.0)
MCV: 92.6 fL (ref 80.0–100.0)
MPV: 10 fL (ref 7.5–12.5)
Platelets: 281 10*3/uL (ref 140–400)
RBC: 4.58 MIL/uL (ref 3.80–5.10)
RDW: 13.8 % (ref 11.0–15.0)
WBC: 8 10*3/uL (ref 3.8–10.8)

## 2016-04-04 LAB — COMPREHENSIVE METABOLIC PANEL WITH GFR
AST: 21 U/L (ref 10–35)
Albumin: 4.1 g/dL (ref 3.6–5.1)
Creat: 0.86 mg/dL (ref 0.50–1.10)
Sodium: 139 mmol/L (ref 135–146)
Total Bilirubin: 0.3 mg/dL (ref 0.2–1.2)
Total Protein: 7 g/dL (ref 6.1–8.1)

## 2016-04-04 LAB — VITAMIN D 25 HYDROXY (VIT D DEFICIENCY, FRACTURES): Vit D, 25-Hydroxy: 53 ng/mL (ref 30–100)

## 2016-04-04 LAB — TSH: TSH: 15.39 m[IU]/L — ABNORMAL HIGH

## 2016-04-05 ENCOUNTER — Other Ambulatory Visit: Payer: Self-pay | Admitting: Sports Medicine

## 2016-04-06 ENCOUNTER — Encounter: Payer: Self-pay | Admitting: Sports Medicine

## 2016-04-06 ENCOUNTER — Ambulatory Visit (INDEPENDENT_AMBULATORY_CARE_PROVIDER_SITE_OTHER): Payer: BLUE CROSS/BLUE SHIELD | Admitting: Sports Medicine

## 2016-04-06 VITALS — BP 136/76 | HR 68 | Wt 255.0 lb

## 2016-04-06 DIAGNOSIS — G5603 Carpal tunnel syndrome, bilateral upper limbs: Secondary | ICD-10-CM | POA: Diagnosis not present

## 2016-04-06 DIAGNOSIS — M5030 Other cervical disc degeneration, unspecified cervical region: Secondary | ICD-10-CM | POA: Diagnosis not present

## 2016-04-06 DIAGNOSIS — E038 Other specified hypothyroidism: Secondary | ICD-10-CM

## 2016-04-06 DIAGNOSIS — E063 Autoimmune thyroiditis: Principal | ICD-10-CM

## 2016-04-06 NOTE — Assessment & Plan Note (Signed)
Overall doing well after injection at the last visit however did have some increasing paresthesias after provocative yoga pose. Rather than proceeding to surgery she will simply avoid provocative poses.

## 2016-04-06 NOTE — Assessment & Plan Note (Signed)
No evidence of radiculopathy with nerve conduction study. She does have mild central canal stenosis that could be responsible for some of her bilateral upper extremity paresthesias, however I think most of her symptoms are from carpal tunnel. She will discuss cervical epidural with her physiatry at [facility].

## 2016-04-06 NOTE — Progress Notes (Signed)
  Subjective:    CC: Follow-up  HPI:  Carpal tunnel syndrome: Went into opposed that placed her wrists into dorsiflexion. Injection was last month, having a recurrence of pain.  Cervical degenerative disc disease: Some pain in the neck with extension of the neck, I have asked her to discuss this with her physiatrists at [facility].  Past medical history, Surgical history, Family history not pertinant except as noted below, Social history, Allergies, and medications have been entered into the medical record, reviewed, and no changes needed.   Review of Systems: No fevers, chills, night sweats, weight loss, chest pain, or shortness of breath.   Objective:    General: Well Developed, well nourished, and in no acute distress.  Neuro: Alert and oriented x3, extra-ocular muscles intact, sensation grossly intact.  HEENT: Normocephalic, atraumatic, pupils equal round reactive to light, neck supple, no masses, no lymphadenopathy, thyroid nonpalpable.  Skin: Warm and dry, no rashes. Cardiac: Regular rate and rhythm, no murmurs rubs or gallops, no lower extremity edema.  Respiratory: Clear to auscultation bilaterally. Not using accessory muscles, speaking in full sentences.  Impression and Recommendations:    I spent 25 minutes with this patient, greater than 50% was face-to-face time counseling regarding the above diagnoses

## 2016-04-07 MED ORDER — LEVOTHYROXINE SODIUM 137 MCG PO TABS: 137.0000 ug | ORAL_TABLET | Freq: Every day | ORAL | Status: DC

## 2016-04-09 ENCOUNTER — Other Ambulatory Visit: Payer: Self-pay | Admitting: Sports Medicine

## 2016-04-10 ENCOUNTER — Encounter: Payer: Self-pay | Admitting: Sports Medicine

## 2016-04-29 LAB — HM MAMMOGRAPHY

## 2016-05-05 ENCOUNTER — Other Ambulatory Visit: Payer: Self-pay | Admitting: Sports Medicine

## 2016-05-11 ENCOUNTER — Encounter: Payer: Self-pay | Admitting: Sports Medicine

## 2016-05-14 ENCOUNTER — Telehealth: Payer: Self-pay

## 2016-05-14 DIAGNOSIS — E039 Hypothyroidism, unspecified: Secondary | ICD-10-CM

## 2016-05-14 MED ORDER — LEVOTHYROXINE SODIUM 150 MCG PO TABS: 150.0000 ug | ORAL_TABLET | Freq: Every day | ORAL | 3 refills | Status: DC

## 2016-05-14 NOTE — Telephone Encounter (Signed)
We need to increase levothyroxine to 150 g and recheck in 6 weeks. All orders placed.

## 2016-05-14 NOTE — Assessment & Plan Note (Signed)
TSH at OB/GYN office was still high, increasing to 150 g levothyroxine and rechecking levels in 6 weeks.

## 2016-05-14 NOTE — Telephone Encounter (Signed)
Pt.notified

## 2016-05-14 NOTE — Telephone Encounter (Signed)
Pt left VM stating her TSH was 5.37 from her GYN appointment with [facility] 05/13/16. Would like to know what she should do next. Please advise.

## 2016-05-20 ENCOUNTER — Encounter: Payer: Self-pay | Admitting: Sports Medicine

## 2016-05-27 ENCOUNTER — Encounter: Payer: Self-pay | Admitting: Sports Medicine

## 2016-06-01 ENCOUNTER — Encounter: Payer: Self-pay | Admitting: Sports Medicine

## 2016-06-02 ENCOUNTER — Other Ambulatory Visit: Payer: Self-pay | Admitting: Sports Medicine

## 2016-06-05 DIAGNOSIS — M47812 Spondylosis without myelopathy or radiculopathy, cervical region: Secondary | ICD-10-CM | POA: Insufficient documentation

## 2016-06-18 ENCOUNTER — Ambulatory Visit (INDEPENDENT_AMBULATORY_CARE_PROVIDER_SITE_OTHER): Payer: BLUE CROSS/BLUE SHIELD | Admitting: Sports Medicine

## 2016-06-18 ENCOUNTER — Other Ambulatory Visit: Payer: Self-pay | Admitting: Sports Medicine

## 2016-06-18 DIAGNOSIS — D229 Melanocytic nevi, unspecified: Secondary | ICD-10-CM | POA: Insufficient documentation

## 2016-06-18 DIAGNOSIS — Z23 Encounter for immunization: Secondary | ICD-10-CM | POA: Diagnosis not present

## 2016-06-18 NOTE — Assessment & Plan Note (Signed)
1 cm nevus shave biopsy with Hyfrecator for hemostasis. Return as needed.

## 2016-06-18 NOTE — Progress Notes (Signed)
  Subjective:    CC:  Skin lesion  HPI: This is a pleasant 50 year old female, she is noted an increasing in size pigmented lesion on her left upper arm, she has been spending a good amount of time in the sun.  Past medical history, Surgical history, Family history not pertinant except as noted below, Social history, Allergies, and medications have been entered into the medical record, reviewed, and no changes needed.   Review of Systems: No fevers, chills, night sweats, weight loss, chest pain, or shortness of breath.   Objective:    General: Well Developed, well nourished, and in no acute distress.  Neuro: Alert and oriented x3, extra-ocular muscles intact, sensation grossly intact.  HEENT: Normocephalic, atraumatic, pupils equal round reactive to light, neck supple, no masses, no lymphadenopathy, thyroid nonpalpable.  Skin: Warm and dry, no rashes. There is a 1 cm hyperpigmented nevus that is somewhat variegated but symmetric over the left upper outer arm Cardiac: Regular rate and rhythm, no murmurs rubs or gallops, no lower extremity edema.  Respiratory: Clear to auscultation bilaterally. Not using accessory muscles, speaking in full sentences.  Procedure: Shave biopsy of left 1 cm hyperpigmented skin lesion Risks, benefits, and alternatives explained and consent obtained. Time out conducted. Surface prepped with alcohol. 1cc lidocaine with epinephine infiltrated in a field block. Adequate anesthesia ensured. Area prepped and draped in a sterile fashion. I shaved the lesion and hyfrecated for hemostasis. Hemostasis achieved. Pt stable.  Impression and Recommendations:    Nevus 1 cm nevus shave biopsy with Hyfrecator for hemostasis. Return as needed.  I spent 25 minutes with this patient, greater than 50% was face-to-face time counseling regarding the above diagnoses,  This was separate from the time spent performing the above procedure

## 2016-06-28 ENCOUNTER — Other Ambulatory Visit: Payer: Self-pay | Admitting: Sports Medicine

## 2016-07-03 ENCOUNTER — Ambulatory Visit: Payer: Self-pay | Admitting: Sports Medicine

## 2016-07-03 ENCOUNTER — Other Ambulatory Visit: Payer: Self-pay

## 2016-07-03 DIAGNOSIS — E039 Hypothyroidism, unspecified: Secondary | ICD-10-CM

## 2016-07-04 ENCOUNTER — Encounter: Payer: Self-pay | Admitting: Sports Medicine

## 2016-07-04 LAB — TSH: TSH: 1.33 m[IU]/L

## 2016-07-06 ENCOUNTER — Ambulatory Visit (INDEPENDENT_AMBULATORY_CARE_PROVIDER_SITE_OTHER): Payer: BLUE CROSS/BLUE SHIELD | Admitting: Sports Medicine

## 2016-07-06 DIAGNOSIS — M5030 Other cervical disc degeneration, unspecified cervical region: Secondary | ICD-10-CM | POA: Diagnosis not present

## 2016-07-06 DIAGNOSIS — E039 Hypothyroidism, unspecified: Secondary | ICD-10-CM

## 2016-07-06 DIAGNOSIS — G5603 Carpal tunnel syndrome, bilateral upper limbs: Secondary | ICD-10-CM

## 2016-07-06 NOTE — Assessment & Plan Note (Signed)
Previous injection was approximately 4 months ago. Repeat bilateral median nerve hydrodissection. Return to see me as needed.

## 2016-07-06 NOTE — Assessment & Plan Note (Signed)
No evidence of radiculopathy on nerve conduction study. She does have some mild central canal stenosis, significant neck pain is mostly from facet arthritis which has been injected at [facility] neuroscience back in July. She is having a recurrence of pain, and considering her good response we are going to repeat her bilateral C5-6 and C6-7 facets. Return to see me approximately one month after the facet joint injections with [facility] imaging. She desires next week.

## 2016-07-06 NOTE — Progress Notes (Addendum)
Subjective:    CC: bilateral carpal tunnel pain   HPI: 50 yo with history of recurrent carpal tunnel syndrome bilaterally, here to discuss getting injection for carpal tunnel pain.  Patient says she has a long history of carpal tunnel syndrome bilaterally.  She wears a brace on both hands at night, but pain has been getting consistently worse.  Pain is worse on L than R.  She describes pain as "tingling, shooting pains", similar to prior flares of carpal tunnel syndrome.  She has not taken any medication for pain.  Last hydrodissection injection 4 months ago, bilaterally.   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  She has trouble with overhead extension, but it has been gradually getting better.  She has had a nerve conduction study in the past which did not show any evidence of radiculopathy.  She also has a history of cervical facet arthritis and has received facet injections earlier this year.    She is also hoping to have moles on her back checked, and would like to discuss her TSH results.   Past medical history, Surgical history, Family history not pertinant except as noted below, Social history, Allergies, and medications have been entered into the medical record, reviewed, and no changes needed.   Review of Systems: No fevers, chills, night sweats, weight loss, chest pain, or shortness of breath.   Objective:    General: Well Developed, well nourished, and in no acute distress.  Neuro: Alert and oriented x3, extra-ocular muscles intact, sensation grossly intact.  HEENT: Normocephalic, atraumatic, pupils equal round reactive to light, neck supple, no masses, no lymphadenopathy, thyroid nonpalpable.  Skin: Warm and dry, no rashes.  Benign nevi on back.  Cardiac: Regular rate and rhythm, no murmurs rubs or gallops, no lower extremity edema.  Respiratory: Clear to auscultation bilaterally. Not using accessory muscles, speaking  in full sentences. R, L Wrist: Inspection normal with no visible erythema or swelling. ROM smooth and normal with good flexion and extension and ulnar/radial deviation that is symmetrical with opposite wrist. Palpation is normal over metacarpals, navicular, lunate, and TFCC; tendons without tenderness/ swelling No snuffbox tenderness. No tenderness over Canal of Gu[place]. Strength 5/5 in all directions without pain. Positive Tinel's test bilaterally.  Negative Watson's test.  Procedure: Real-time Ultrasound Guided left median nerve hydrodissection of the carpal tunnel Device: GE Logiq E  Verbal informed consent obtained.  Time-out conducted.  Noted no overlying erythema, induration, or other signs of local infection.  Skin prepped in a sterile fashion.  Local anesthesia: Topical Ethyl chloride.  With sterile technique and under real time ultrasound guidance: Medication injected both superficial to and deep to the median nerve, freeing it from surrounding structures, needle redirected deep into the carpal tunnel and a total of 1 mL kenalog 40, 2 mL lidocaine, 2 mL Marcaine was injected.  Completed without difficulty  Pain immediately resolved suggesting accurate placement of the medication.  Advised to call if fevers/chills, erythema, induration, drainage, or persistent bleeding.  Images permanently stored and available for review in the ultrasound unit.  Impression: Technically successful ultrasound guided injection.  Procedure: Real-time Ultrasound Guided right median nerve hydrodissection of the carpal tunnel Device: GE Logiq E  Verbal informed consent obtained.  Time-out conducted.  Noted no overlying erythema, induration, or other signs of local infection.  Skin prepped in a sterile fashion.  Local anesthesia: Topical Ethyl chloride.  With sterile technique and under real time  ultrasound guidance: Medication injected both superficial to and deep to the median nerve, freeing it from  surrounding structures, needle redirected deep into the carpal tunnel and a total of 1 mL kenalog 40, 2 mL lidocaine, 2 mL Marcaine was injected.  Completed without difficulty  Pain immediately resolved suggesting accurate placement of the medication.  Advised to call if fevers/chills, erythema, induration, drainage, or persistent bleeding.  Images permanently stored and available for review in the ultrasound unit.  Impression: Technically successful ultrasound guided injection.  Impression and Recommendations:    1. Bilateral carpal tunnel syndrome -bilateral median nerve hydrodissection   2. Hypothyroidism: TSH normal, symptoms improving -Continue levothyroxine 150 mcg daily   3. Benign nevi on back: continue to monitor for any changes, none are concerning at this time -follow-up PRN   4. Degenerative cervical disc disease: recurrence of pain 3 months following facet injections -referral for repeat facet joint injections: C5-6 and C6-7 facets -follow-up 1 month following injections

## 2016-07-15 ENCOUNTER — Other Ambulatory Visit: Payer: Self-pay

## 2016-07-20 ENCOUNTER — Ambulatory Visit
Admission: RE | Admit: 2016-07-20 | Discharge: 2016-07-20 | Disposition: A | Discharge status: Home / Self Care | Payer: BLUE CROSS/BLUE SHIELD | Source: Ambulatory Visit | Attending: Sports Medicine | Admitting: Sports Medicine

## 2016-07-20 ENCOUNTER — Other Ambulatory Visit: Payer: Self-pay | Admitting: Sports Medicine

## 2016-07-20 DIAGNOSIS — M5030 Other cervical disc degeneration, unspecified cervical region: Secondary | ICD-10-CM

## 2016-07-20 MED ORDER — IOPAMIDOL (ISOVUE-M 300) INJECTION 61%
1.0000 mL | Freq: Once | INTRAMUSCULAR | Status: AC | PRN
  Administered 2016-07-20: 1 mL via INTRA_ARTICULAR

## 2016-07-20 MED ORDER — DEXAMETHASONE SODIUM PHOSPHATE 4 MG/ML IJ SOLN
5.0000 mg | Freq: Once | INTRAMUSCULAR | Status: AC
  Administered 2016-07-20: 5.2 mg via INTRA_ARTICULAR

## 2016-07-20 NOTE — Discharge Instructions (Signed)

## 2016-07-28 ENCOUNTER — Other Ambulatory Visit: Payer: Self-pay | Admitting: Sports Medicine

## 2016-08-04 ENCOUNTER — Telehealth: Payer: Self-pay

## 2016-08-05 ENCOUNTER — Ambulatory Visit: Payer: Self-pay | Admitting: Sports Medicine

## 2016-08-10 ENCOUNTER — Encounter: Payer: Self-pay | Admitting: Sports Medicine

## 2016-08-10 ENCOUNTER — Ambulatory Visit (INDEPENDENT_AMBULATORY_CARE_PROVIDER_SITE_OTHER): Payer: BLUE CROSS/BLUE SHIELD | Admitting: Sports Medicine

## 2016-08-10 DIAGNOSIS — N920 Excessive and frequent menstruation with regular cycle: Secondary | ICD-10-CM | POA: Diagnosis not present

## 2016-08-10 DIAGNOSIS — Z0000 Encounter for general adult medical examination without abnormal findings: Secondary | ICD-10-CM

## 2016-08-10 DIAGNOSIS — G5603 Carpal tunnel syndrome, bilateral upper limbs: Secondary | ICD-10-CM | POA: Diagnosis not present

## 2016-08-10 DIAGNOSIS — E669 Obesity, unspecified: Secondary | ICD-10-CM

## 2016-08-10 DIAGNOSIS — M5030 Other cervical disc degeneration, unspecified cervical region: Secondary | ICD-10-CM

## 2016-08-10 DIAGNOSIS — E039 Hypothyroidism, unspecified: Secondary | ICD-10-CM

## 2016-08-10 NOTE — Assessment & Plan Note (Signed)
Up-to-date on screening measures, does need HIV test.

## 2016-08-10 NOTE — Assessment & Plan Note (Signed)
Doing extremely well after bilateral median nerve hydrodissection at the last visit.

## 2016-08-10 NOTE — Progress Notes (Signed)
  Subjective:    CC: Follow-up  HPI:  Cervical spondylosis: Only had a temporary response to her facet injections.  Carpal tunnel syndrome: Doing extremely well after bilateral median nerve hydrodissection at the last visit.  Medical problems: Due for some routine blood work.  Spotting: Has a visit coming up with OB/GYN.  Past medical history:  Negative.  See flowsheet/record as well for more information.  Surgical history: Negative.  See flowsheet/record as well for more information.  Family history: Negative.  See flowsheet/record as well for more information.  Social history: Negative.  See flowsheet/record as well for more information.  Allergies, and medications have been entered into the medical record, reviewed, and no changes needed.   Review of Systems: No fevers, chills, night sweats, weight loss, chest pain, or shortness of breath.   Objective:    General: Well Developed, well nourished, and in no acute distress.  Neuro: Alert and oriented x3, extra-ocular muscles intact, sensation grossly intact.  HEENT: Normocephalic, atraumatic, pupils equal round reactive to light, neck supple, no masses, no lymphadenopathy, thyroid nonpalpable.  Skin: Warm and dry, no rashes. Cardiac: Regular rate and rhythm, no murmurs rubs or gallops, no lower extremity edema.  Respiratory: Clear to auscultation bilaterally. Not using accessory muscles, speaking in full sentences.  Impression and Recommendations:    Hypothyroidism Rechecking TSH.  Spotting Has an appointment with OB/GYN. Rechecking TSH, as well as fertility hormones.  Degenerative disc disease, cervical Mild central canal stenosis on MRI, most of neck pain is however from facet arthritis, [facility] did facet injections in July that worked well, more recently we proceeded with bilateral C5-6 and C6-7 facet joint injections which provided only a week of relief. I think it is time for her to consider radiofrequency  ablation.  Carpal tunnel syndrome, bilateral Doing extremely well after bilateral median nerve hydrodissection at the last visit.  Obesity Rechecking lipids  Annual physical exam Up-to-date on screening measures, does need HIV test.  I spent 25 minutes with this patient, greater than 50% was face-to-face time counseling regarding the above diagnoses

## 2016-08-10 NOTE — Assessment & Plan Note (Signed)
Rechecking lipids. 

## 2016-08-10 NOTE — Assessment & Plan Note (Signed)
Rechecking TSH 

## 2016-08-10 NOTE — Assessment & Plan Note (Signed)
Mild central canal stenosis on MRI, most of neck pain is however from facet arthritis, [facility] did facet injections in July that worked well, more recently we proceeded with bilateral C5-6 and C6-7 facet joint injections which provided only a week of relief. I think it is time for her to consider radiofrequency ablation.

## 2016-08-10 NOTE — Assessment & Plan Note (Signed)
Has an appointment with OB/GYN. Rechecking TSH, as well as fertility hormones.

## 2016-08-11 ENCOUNTER — Other Ambulatory Visit: Payer: Self-pay | Admitting: Sports Medicine

## 2016-08-11 NOTE — Telephone Encounter (Signed)
Error

## 2016-08-12 ENCOUNTER — Encounter: Payer: Self-pay | Admitting: Sports Medicine

## 2016-08-12 LAB — CBC
HCT: 39.1 % (ref 35.0–45.0)
Hemoglobin: 12.5 g/dL (ref 11.7–15.5)
MCH: 29.6 pg (ref 27.0–33.0)
MCHC: 32 g/dL (ref 32.0–36.0)
MCV: 92.7 fL (ref 80.0–100.0)
MPV: 9.8 fL (ref 7.5–12.5)
Platelets: 297 K/uL (ref 140–400)
RBC: 4.22 MIL/uL (ref 3.80–5.10)
RDW: 13.7 % (ref 11.0–15.0)
WBC: 6 K/uL (ref 3.8–10.8)

## 2016-08-13 LAB — PROGESTERONE: Progesterone: <0.5 ng/mL

## 2016-08-13 LAB — PROLACTIN: Prolactin: 8.9 ng/mL

## 2016-08-13 LAB — TSH: TSH: 4.39 mIU/L

## 2016-08-13 LAB — COMPREHENSIVE METABOLIC PANEL WITH GFR
ALT: 24 U/L (ref 6–29)
AST: 25 U/L (ref 10–35)
Alkaline Phosphatase: 33 U/L (ref 33–115)
BUN: 12 mg/dL (ref 7–25)
Glucose, Bld: 92 mg/dL (ref 65–99)
Potassium: 4.5 mmol/L (ref 3.5–5.3)
Total Bilirubin: 0.3 mg/dL (ref 0.2–1.2)

## 2016-08-13 LAB — LIPID PANEL
Cholesterol: 172 mg/dL (ref 125–200)
HDL: 71 mg/dL (ref >=46)
LDL Cholesterol: 84 mg/dL (ref <130)
Total CHOL/HDL Ratio: 2.4 Ratio (ref <=5.0)
Triglycerides: 83 mg/dL (ref <150)
VLDL: 17 mg/dL (ref <30)

## 2016-08-13 LAB — COMPREHENSIVE METABOLIC PANEL
Albumin: 3.7 g/dL (ref 3.6–5.1)
CO2: 25 mmol/L (ref 20–31)
Calcium: 8.8 mg/dL (ref 8.6–10.2)
Chloride: 104 mmol/L (ref 98–110)
Creat: 0.84 mg/dL (ref 0.50–1.10)
Sodium: 140 mmol/L (ref 135–146)
Total Protein: 6.1 g/dL (ref 6.1–8.1)

## 2016-08-13 LAB — HEMOGLOBIN A1C
Hgb A1c MFr Bld: 5.7 % — ABNORMAL HIGH (ref <5.7)
Mean Plasma Glucose: 117 mg/dL

## 2016-08-13 LAB — LUTEINIZING HORMONE: LH: 3.3 m[IU]/mL

## 2016-08-13 LAB — HIV ANTIBODY (ROUTINE TESTING W REFLEX): HIV 1&2 Ab, 4th Generation: NONREACTIVE

## 2016-08-13 LAB — FOLLICLE STIMULATING HORMONE: FSH: 3.7 m[IU]/mL

## 2016-08-16 ENCOUNTER — Encounter: Payer: Self-pay | Admitting: Sports Medicine

## 2016-08-20 LAB — ESTROGENS, TOTAL: Estrogen: 337.1 pg/mL

## 2016-09-05 ENCOUNTER — Other Ambulatory Visit: Payer: Self-pay | Admitting: Sports Medicine

## 2016-09-05 ENCOUNTER — Encounter: Payer: Self-pay | Admitting: Sports Medicine

## 2016-09-12 ENCOUNTER — Other Ambulatory Visit: Payer: Self-pay | Admitting: Sports Medicine

## 2016-09-23 ENCOUNTER — Encounter: Payer: Self-pay | Admitting: Sports Medicine

## 2016-09-29 ENCOUNTER — Other Ambulatory Visit: Payer: Self-pay | Admitting: Sports Medicine

## 2016-10-16 ENCOUNTER — Encounter: Payer: Self-pay | Admitting: Sports Medicine

## 2016-10-24 ENCOUNTER — Other Ambulatory Visit: Payer: Self-pay | Admitting: Sports Medicine

## 2016-10-26 ENCOUNTER — Other Ambulatory Visit: Payer: Self-pay | Admitting: Sports Medicine

## 2016-10-29 ENCOUNTER — Ambulatory Visit (INDEPENDENT_AMBULATORY_CARE_PROVIDER_SITE_OTHER): Payer: BLUE CROSS/BLUE SHIELD | Admitting: Sports Medicine

## 2016-10-29 ENCOUNTER — Encounter: Payer: Self-pay | Admitting: Sports Medicine

## 2016-10-29 DIAGNOSIS — G5603 Carpal tunnel syndrome, bilateral upper limbs: Secondary | ICD-10-CM

## 2016-10-29 DIAGNOSIS — R590 Localized enlarged lymph nodes: Secondary | ICD-10-CM | POA: Insufficient documentation

## 2016-10-29 NOTE — Assessment & Plan Note (Signed)
Bilateral median nerve hydrodissection, previous injection provided for months of relief

## 2016-10-29 NOTE — Progress Notes (Signed)
Subjective:    CC: Bilateral hand pain  HPI: This is a pleasant 51 year old female, she has known carpal tunnel syndrome, previous bilateral median nerve hydrodissection was almost 4 months ago, desires repeat today, pain is moderate, persistent, localized on the volar aspect of both hands with numbness and tingling into the fingers.  Lump on neck: Present for a few days, has reported upper rest for symptoms, painful, over the left mastoid process.  Past medical history:  Negative.  See flowsheet/record as well for more information.  Surgical history: Negative.  See flowsheet/record as well for more information.  Family history: Negative.  See flowsheet/record as well for more information.  Social history: Negative.  See flowsheet/record as well for more information.  Allergies, and medications have been entered into the medical record, reviewed, and no changes needed.   Review of Systems: No fevers, chills, night sweats, weight loss, chest pain, or shortness of breath.   Objective:    General: Well Developed, well nourished, and in no acute distress.  Neuro: Alert and oriented x3, extra-ocular muscles intact, sensation grossly intact.  HEENT: Normocephalic, atraumatic, pupils equal round reactive to light, neck supple, no masses, Single shoddy left-sided mastoid tender lymph node, thyroid nonpalpable.  Skin: Warm and dry, no rashes. Cardiac: Regular rate and rhythm, no murmurs rubs or gallops, no lower extremity edema.  Respiratory: Clear to auscultation bilaterally. Not using accessory muscles, speaking in full sentences.  Procedure: Real-time Ultrasound Guided Injection of left median nerve hydrodissection Device: GE Logiq E  Verbal informed consent obtained.  Time-out conducted.  Noted no overlying erythema, induration, or other signs of local infection.  Skin prepped in a sterile fashion.  Local anesthesia: Topical Ethyl chloride.  With sterile technique and under real time  ultrasound guidance:  Using a 25-gauge needle advanced into the carpal tunnel and taking care to avoid intra-neural injection and I placed medication both superficial to and deep to the median nerve in the carpal tunnel, I then redirected the needle deep and placed some more medication around the flexor tendons for a total of 1 mL kenalog 40, 5 mL lidocaine. Completed without difficulty  Pain immediately resolved suggesting accurate placement of the medication.  Advised to call if fevers/chills, erythema, induration, drainage, or persistent bleeding.  Images permanently stored and available for review in the ultrasound unit.  Impression: Technically successful ultrasound guided injection.  Procedure: Real-time Ultrasound Guided Injection of right median nerve hydrodissection Device: GE Logiq E  Verbal informed consent obtained.  Time-out conducted.  Noted no overlying erythema, induration, or other signs of local infection.  Skin prepped in a sterile fashion.  Local anesthesia: Topical Ethyl chloride.  With sterile technique and under real time ultrasound guidance:  Using a 25-gauge needle advanced into the carpal tunnel and taking care to avoid intra-neural injection and I placed medication both superficial to and deep to the median nerve in the carpal tunnel, I then redirected the needle deep and placed some more medication around the flexor tendons for a total of 1 mL kenalog 40, 5 mL lidocaine. Completed without difficulty  Pain immediately resolved suggesting accurate placement of the medication.  Advised to call if fevers/chills, erythema, induration, drainage, or persistent bleeding.  Images permanently stored and available for review in the ultrasound unit.  Impression: Technically successful ultrasound guided injection.  Impression and Recommendations:    Carpal tunnel syndrome, bilateral Bilateral median nerve hydrodissection, previous injection provided for months of  relief  Cervical lymphadenopathy Left-sided posterior chain/mastoid tender  lymphadenopathy consistent with a reactive node. Patient reports concurrent upper rest for symptoms, recheck in a few months.

## 2016-10-29 NOTE — Assessment & Plan Note (Signed)
Left-sided posterior chain/mastoid tender lymphadenopathy consistent with a reactive node. Patient reports concurrent upper rest for symptoms, recheck in a few months.

## 2016-11-03 ENCOUNTER — Encounter: Payer: Self-pay | Admitting: Sports Medicine

## 2016-11-03 ENCOUNTER — Ambulatory Visit (INDEPENDENT_AMBULATORY_CARE_PROVIDER_SITE_OTHER): Payer: BLUE CROSS/BLUE SHIELD | Admitting: Sports Medicine

## 2016-11-03 DIAGNOSIS — E669 Obesity, unspecified: Secondary | ICD-10-CM

## 2016-11-03 DIAGNOSIS — M47812 Spondylosis without myelopathy or radiculopathy, cervical region: Secondary | ICD-10-CM | POA: Diagnosis not present

## 2016-11-03 DIAGNOSIS — G5603 Carpal tunnel syndrome, bilateral upper limbs: Secondary | ICD-10-CM | POA: Diagnosis not present

## 2016-11-03 NOTE — Assessment & Plan Note (Addendum)
Continues to have neck pain, she did have a moderate response to cervical facet joint injections 2, C5-C7. Her pain doctors are currently planning 3 months of physical therapy dedicated to the neck followed by multilevel left-sided cervical medial branch blocks likely from C3 down to C7. If these are effective, especially considering her cervical cephalalgia, then she would be a candidate for medial branch blocks. I did have to explain their treatment plan to her, and she is agreeable now to work further with them.

## 2016-11-03 NOTE — Assessment & Plan Note (Signed)
Continues to do well after bilateral median nerve hydrodissection about 5 days ago.

## 2016-11-03 NOTE — Progress Notes (Signed)
  Subjective:    CC: Follow-up  HPI: Carpal tunnel syndrome: Doing well now after bilateral median nerve hydrodissection.  Neck and shoulder pain: Known cervical spondylosis with facet arthritis and cervical cephalalgia. We did send her for C5-6 and C6-7 bilateral facet joint injections which provided 40-60% pain relief. She has since followed up with her pain doctor, he is planning left-sided C3-C7 facet joint medial branch blocks to further evaluate cervical cephalalgia and provide more complete pain relief, if good relief he is going to proceed with radiofrequency ablation which can be nearly curative. She was unaware of this, and needed someone to explained the plan to her. She does have to go through 3 months of dedicated physical therapy for her neck, and had initially declined this.  Past medical history:  Negative.  See flowsheet/record as well for more information.  Surgical history: Negative.  See flowsheet/record as well for more information.  Family history: Negative.  See flowsheet/record as well for more information.  Social history: Negative.  See flowsheet/record as well for more information.  Allergies, and medications have been entered into the medical record, reviewed, and no changes needed.   Review of Systems: No fevers, chills, night sweats, weight loss, chest pain, or shortness of breath.   Objective:    General: Well Developed, well nourished, and in no acute distress.  Neuro: Alert and oriented x3, extra-ocular muscles intact, sensation grossly intact.  HEENT: Normocephalic, atraumatic, pupils equal round reactive to light, neck supple, no masses, no lymphadenopathy, thyroid nonpalpable.  Skin: Warm and dry, no rashes. Cardiac: Regular rate and rhythm, no murmurs rubs or gallops, no lower extremity edema.  Respiratory: Clear to auscultation bilaterally. Not using accessory muscles, speaking in full sentences.  Impression and Recommendations:    Cervical  spondylosis without myelopathy Continues to have neck pain, she did have a moderate response to cervical facet joint injections 2, C5-C7. Her pain doctors are currently planning 3 months of physical therapy dedicated to the neck followed by multilevel left-sided cervical medial branch blocks likely from C3 down to C7. If these are effective, especially considering her cervical cephalalgia, then she would be a candidate for medial branch blocks. I did have to explain their treatment plan to her, and she is agreeable now to work further with them.  Obesity She will plan on losing a great deal of weight in 6 months, if unable we will proceed with bariatric referral.  Carpal tunnel syndrome, bilateral Continues to do well after bilateral median nerve hydrodissection about 5 days ago.  I spent 25 minutes with this patient, greater than 50% was face-to-face time counseling regarding the above diagnoses

## 2016-11-03 NOTE — Assessment & Plan Note (Signed)
She will plan on losing a great deal of weight in 6 months, if unable we will proceed with bariatric referral.

## 2016-11-09 ENCOUNTER — Encounter: Payer: Self-pay | Admitting: Emergency Medicine

## 2016-11-09 ENCOUNTER — Emergency Department (INDEPENDENT_AMBULATORY_CARE_PROVIDER_SITE_OTHER): Payer: BLUE CROSS/BLUE SHIELD

## 2016-11-09 ENCOUNTER — Emergency Department
Admission: EM | Admit: 2016-11-09 | Discharge: 2016-11-09 | Disposition: A | Discharge status: Home / Self Care | Payer: BLUE CROSS/BLUE SHIELD | Source: Home / Self Care | Attending: Family Medicine | Admitting: Family Medicine

## 2016-11-09 DIAGNOSIS — R6889 Other general symptoms and signs: Secondary | ICD-10-CM

## 2016-11-09 DIAGNOSIS — R05 Cough: Secondary | ICD-10-CM | POA: Diagnosis not present

## 2016-11-09 DIAGNOSIS — R6883 Chills (without fever): Secondary | ICD-10-CM | POA: Diagnosis not present

## 2016-11-09 DIAGNOSIS — R61 Generalized hyperhidrosis: Secondary | ICD-10-CM

## 2016-11-09 MED ORDER — BENZONATATE 100 MG PO CAPS: 100.0000 mg | ORAL_CAPSULE | Freq: Three times a day (TID) | ORAL | 0 refills | Status: DC

## 2016-11-09 MED ORDER — ACETAMINOPHEN 325 MG PO TABS: 975.0000 mg | ORAL_TABLET | Freq: Once | ORAL | Status: DC

## 2016-11-09 NOTE — ED Triage Notes (Signed)
Dry cough, chills, sweats, body aches x 5 days

## 2016-11-09 NOTE — ED Provider Notes (Signed)
CSN: [id number]     Arrival date & time 11/09/16  1354 History   First MD Initiated Contact with Patient 11/09/16 1449     Chief Complaint  Patient presents with  . Influenza   (Consider location/radiation/quality/duration/timing/severity/associated sxs/prior Treatment) HPI Kelsey Snow is a 51 y.o. female presenting to UC with c/o sudden onset, gradually worsening flu-like symptoms for about 5 days. Associated body aches, fever, chills, mild nonproductive cough and fatigue. She did get the flu vaccine this year. No known sick contacts. Denies chest pain or SOB.  She has been sweating.  No medication taken today for symptoms.   Past Medical History:  Diagnosis Date  . Anemia   . Essential hypertension, benign 03/07/2015  . GERD (gastroesophageal reflux disease)   . Hx of ectopic pregnancy   . Hyperlipidemia   . Lumbosacral spondylolysis   . Obese   . OCD (obsessive compulsive disorder)   . Thyroid disease    Past Surgical History:  Procedure Laterality Date  . BACK SURGERY    . ECTOPIC PREGNANCY SURGERY    . TUBAL LIGATION    . TUBAL LIGATION     Family History  Problem Relation Age of Onset  . Aneurysm Father   . AAA (abdominal aortic aneurysm) Father   . Depression Mother   . Cancer Mother     Pancreatic  . Thyroid disease Mother     hypothyroidism  . Varicose Veins Mother   . Drug abuse Sister   . Hyperlipidemia Brother   . Diabetes Maternal Grandmother   . Alcohol abuse Maternal Uncle    Social History  Substance Use Topics  . Smoking status: Never Smoker  . Smokeless tobacco: Never Used  . Alcohol use 7.2 - 7.8 oz/week    6 - 7 Glasses of wine, 6 Standard drinks or equivalent per week   OB History    No data available     Review of Systems  Constitutional: Positive for chills, diaphoresis, fatigue and fever ( subjective).  HENT: Positive for congestion. Negative for ear pain, sore throat, trouble swallowing and voice change.   Respiratory:  Positive for cough. Negative for shortness of breath.   Cardiovascular: Negative for chest pain and palpitations.  Gastrointestinal: Negative for abdominal pain, diarrhea, nausea and vomiting.  Musculoskeletal: Positive for arthralgias and myalgias. Negative for back pain.  Skin: Negative for rash.  Neurological: Positive for headaches. Negative for dizziness and light-headedness.    Allergies  Bee venom  Home Medications   Prior to Admission medications   Medication Sig Start Date End Date Taking? Authorizing Provider  aspirin 81 MG EC tablet TAKE 1 TABLET (81 MG TOTAL) BY MOUTH DAILY. 08/22/15   Silverio Decamp, MD  benzonatate (TESSALON) 100 MG capsule Take 1-2 capsules (100-200 mg total) by mouth every 8 (eight) hours. 11/09/16   Noland Fordyce, PA-C  Cholecalciferol (VITAMIN D3) 2000 UNITS TABS Take by mouth 2 (two) times a week. TAKE 2 TABLETS Sunday AND 1 TABLET Tuesday AND Thursday.    Historical Provider, MD  escitalopram (LEXAPRO) 20 MG tablet TAKE 1 TABLET (20 MG TOTAL) BY MOUTH DAILY. 09/29/16   Silverio Decamp, MD  levothyroxine (SYNTHROID, LEVOTHROID) 150 MCG tablet TAKE ONE TABLET BY MOUTH DAILY BEFORE BREAKFAST 09/07/16   Silverio Decamp, MD  Mag Aspart-Potassium Aspart (POTASSIUM & MAGNESIUM ASPARTAT) 250-250 MG CAPS Take by mouth. TAKE 2 CAPSULES Tuesday  AND Thursday  MORNING    Historical Provider, MD  omeprazole (PRILOSEC) 40  MG capsule TAKE ONE CAPSULE BY MOUTH DAILY 10/24/16   Silverio Decamp, MD  rosuvastatin (CRESTOR) 10 MG tablet TAKE 1 TABLET (10 MG TOTAL) BY MOUTH DAILY. 08/11/16   Silverio Decamp, MD  TraMADol HCl 300 MG CP24 Take 1 capsule by mouth daily. 01/14/16   Silverio Decamp, MD  traZODone (DESYREL) 100 MG tablet TAKE 1 OR 2 TABLETS BY MOUTH AT BEDTIME 10/26/16   Silverio Decamp, MD   Meds Ordered and Administered this Visit   Medications  acetaminophen (TYLENOL) tablet 975 mg (not administered)    BP (!) 150/107  (BP Location: Left Arm)   Pulse 73   Temp 98.2 F (36.8 C) (Oral)   Ht 5\' 4"  (1.626 m)   Wt 255 lb (115.7 kg)   SpO2 97%   BMI 43.77 kg/m  No data found.   Physical Exam  Constitutional: She is oriented to person, place, and time. She appears well-developed and well-nourished. No distress.  Pt lying on exam bed, appears acutely ill but non-toxic. Cooperative during exam.  HENT:  Head: Normocephalic and atraumatic.  Right Ear: Tympanic membrane normal.  Left Ear: Tympanic membrane normal.  Nose: Nose normal.  Mouth/Throat: Uvula is midline and mucous membranes are normal. Posterior oropharyngeal erythema present. No oropharyngeal exudate, posterior oropharyngeal edema or tonsillar abscesses.  Eyes: EOM are normal.  Neck: Normal range of motion. Neck supple.  Cardiovascular: Normal rate and regular rhythm.   Pulmonary/Chest: Effort normal and breath sounds normal. No stridor. No respiratory distress. She has no wheezes. She has no rales.  Musculoskeletal: Normal range of motion.  Lymphadenopathy:    She has no cervical adenopathy.  Neurological: She is alert and oriented to person, place, and time.  Skin: Skin is warm. She is diaphoretic.  Psychiatric: She has a normal mood and affect. Her behavior is normal.  Nursing note and vitals reviewed.   Urgent Care Course     Procedures (including critical care time)  Labs Review Labs Reviewed - No data to display  Imaging Review Dg Chest 2 View  Result Date: 11/09/2016 CLINICAL DATA:  Dry cough.  Diaphoresis.  Chills. EXAM: CHEST  2 VIEW COMPARISON:  None. FINDINGS: Normal sized heart.  Clear lungs.  Minimal scoliosis. IMPRESSION: No acute abnormality. Electronically Signed   By: Claudie Revering M.D.   On: 11/09/2016 15:22     MDM   1. Flu-like symptoms    Pt presenting to UC with 5 days of flu-like symptoms. With the reported cough and diaphoresis, CXR performed to r/o pneumonia.  CXR: normal  Symptoms likely viral. Pt  outside of recommended 48 hour treatment window for tamiflu. Encouraged symptomatic treatment. Rx: Tessalon.  Advised pt to use acetaminophen and ibuprofen as needed for fever and pain. Encouraged rest and fluids. F/u with PCP in 5-7 days if not improving, sooner if worsening. Pt verbalized understanding and agreement with tx plan.     Noland Fordyce, PA-C 11/09/16 1553

## 2016-11-09 NOTE — Discharge Instructions (Signed)

## 2016-11-12 ENCOUNTER — Encounter: Payer: Self-pay | Admitting: Sports Medicine

## 2016-11-12 ENCOUNTER — Ambulatory Visit (INDEPENDENT_AMBULATORY_CARE_PROVIDER_SITE_OTHER): Payer: BLUE CROSS/BLUE SHIELD | Admitting: Sports Medicine

## 2016-11-12 DIAGNOSIS — R69 Illness, unspecified: Secondary | ICD-10-CM | POA: Diagnosis not present

## 2016-11-12 DIAGNOSIS — J111 Influenza due to unidentified influenza virus with other respiratory manifestations: Secondary | ICD-10-CM | POA: Insufficient documentation

## 2016-11-12 MED ORDER — KETOROLAC TROMETHAMINE 30 MG/ML IJ SOLN
30.0000 mg | Freq: Once | INTRAMUSCULAR | Status: AC
  Administered 2016-11-12: 30 mg via INTRAMUSCULAR

## 2016-11-12 MED ORDER — HYDROCODONE-HOMATROPINE 5-1.5 MG PO TABS: 1.0000 | ORAL_TABLET | Freq: Three times a day (TID) | ORAL | 0 refills | Status: DC | PRN

## 2016-11-12 MED ORDER — BENZONATATE 200 MG PO CAPS: 200.0000 mg | ORAL_CAPSULE | Freq: Three times a day (TID) | ORAL | 0 refills | Status: DC | PRN

## 2016-11-12 NOTE — Addendum Note (Signed)
Addended by: Elizabeth Sauer on: 11/12/2016 03:55 PM   Modules accepted: Orders

## 2016-11-12 NOTE — Assessment & Plan Note (Signed)
Outside the window for Tamiflu, she will alternate Tylenol and Aleve, she can have 200 mg Tessalon Perles and Hycodan for cough. Return to see me as needed.

## 2016-11-12 NOTE — Patient Instructions (Signed)
Use Hycodan and Tessalon Perles for cough, take Tylenol 650 and Aleve together twice a day.

## 2016-11-12 NOTE — Progress Notes (Signed)
  Subjective:    CC:  Feeling sick still  HPI: This is a pleasant 51 year old female, she had the flu shot this year, unfortunately she developed upper respiratory symptoms, cough, fevers, chills, went to urgent care 3 days ago but was outside the window for Tamiflu. She was given Ladona Ridgel, she is back early with persistence of symptoms, her worst symptom is fevers and chills as well as fatigue and her myalgias have for the most part resolved.  Past medical history:  Negative.  See flowsheet/record as well for more information.  Surgical history: Negative.  See flowsheet/record as well for more information.  Family history: Negative.  See flowsheet/record as well for more information.  Social history: Negative.  See flowsheet/record as well for more information.  Allergies, and medications have been entered into the medical record, reviewed, and no changes needed.   Review of Systems: No fevers, chills, night sweats, weight loss, chest pain, or shortness of breath.   Objective:    General: Well Developed, well nourished, and in no acute distress.  Neuro: Alert and oriented x3, extra-ocular muscles intact, sensation grossly intact.  HEENT: Normocephalic, atraumatic, pupils equal round reactive to light, neck supple, no masses, no lymphadenopathy, thyroid nonpalpable. Oropharynx, nasopharynx, ear canals unremarkable. Skin: Warm and dry, no rashes. Cardiac: Regular rate and rhythm, no murmurs rubs or gallops, no lower extremity edema.  Respiratory: Clear to auscultation bilaterally. Not using accessory muscles, speaking in full sentences.  Impression and Recommendations:    Influenza-like illness Outside the window for Tamiflu, she will alternate Tylenol and Aleve, she can have 200 mg Tessalon Perles and Hycodan for cough. Return to see me as needed.  I spent 25 minutes with this patient, greater than 50% was face-to-face time counseling regarding the above diagnoses

## 2016-11-23 ENCOUNTER — Ambulatory Visit: Payer: Self-pay | Admitting: Physical Therapy

## 2016-11-26 ENCOUNTER — Ambulatory Visit (INDEPENDENT_AMBULATORY_CARE_PROVIDER_SITE_OTHER): Payer: BLUE CROSS/BLUE SHIELD | Admitting: Rehabilitative and Restorative Service Providers"

## 2016-11-26 ENCOUNTER — Encounter: Payer: Self-pay | Admitting: Rehabilitative and Restorative Service Providers"

## 2016-11-26 DIAGNOSIS — M542 Cervicalgia: Secondary | ICD-10-CM | POA: Diagnosis not present

## 2016-11-26 DIAGNOSIS — R293 Abnormal posture: Secondary | ICD-10-CM

## 2016-11-26 DIAGNOSIS — R29898 Other symptoms and signs involving the musculoskeletal system: Secondary | ICD-10-CM

## 2016-11-26 DIAGNOSIS — M6281 Muscle weakness (generalized): Secondary | ICD-10-CM

## 2016-11-26 DIAGNOSIS — M623 Immobility syndrome (paraplegic): Secondary | ICD-10-CM

## 2016-11-26 DIAGNOSIS — G894 Chronic pain syndrome: Secondary | ICD-10-CM | POA: Insufficient documentation

## 2016-11-26 DIAGNOSIS — Z7409 Other reduced mobility: Secondary | ICD-10-CM

## 2016-11-26 DIAGNOSIS — M2560 Stiffness of unspecified joint, not elsewhere classified: Secondary | ICD-10-CM

## 2016-11-26 NOTE — Therapy (Signed)
[facility] [place], [place], [address] Phone: [phone]   Fax:  [phone]  Physical Therapy Evaluation  Patient Details  Name: Kelsey Snow MRN: [id number] Date of Birth: 15-May-1966 Referring Provider: Dr [name]  Encounter Date: 11/26/2016      PT End of Session - 11/26/16 1820    Visit Number 1   Number of Visits 12   Date for PT Re-Evaluation 01/08/17   PT Start Time [id number]   PT Stop Time 1626   PT Time Calculation (min) 56 min   Activity Tolerance Patient tolerated treatment well      Past Medical History:  Diagnosis Date  . Anemia   . Essential hypertension, benign 03/07/2015  . GERD (gastroesophageal reflux disease)   . Hx of ectopic pregnancy   . Hyperlipidemia   . Lumbosacral spondylolysis   . Obese   . OCD (obsessive compulsive disorder)   . Thyroid disease     Past Surgical History:  Procedure Laterality Date  . BACK SURGERY    . ECTOPIC PREGNANCY SURGERY    . TUBAL LIGATION    . TUBAL LIGATION      There were no vitals filed for this visit.       Subjective Assessment - 11/26/16 1540    Subjective Patient reports continued pain in the neck, mid back into posterior arms which has been present for the past year with increased severity in the past 6 months. Awaiting medial branch block to assess possible radiofrequency ablation.    Pertinent History L5/S1 fusion; multiple injections; fibromuscular dysplasia; connective tissue disorder bilat cervical spine;  Lt carotid artery tear partially healed; bilat carpal tunnel    How long can you sit comfortably? 5-10 min    How long can you stand comfortably? 10-15 min    How long can you walk comfortably? 30 min    Diagnostic tests see chart    Patient Stated Goals ease pain and learn what she can do at home    Currently in Pain? Yes   Pain Score 5    Pain Location Neck   Pain Orientation Right;Left   Pain Descriptors /  Indicators Sharp;Throbbing;Burning   Pain Type Chronic pain   Pain Radiating Towards into both shoudlers and upper trap    Pain Onset More than a month ago   Pain Frequency Constant   Aggravating Factors  holding arms elevated; reaching up; lifting; head forward; looking up     Pain Relieving Factors heat; biofreeze; meds            [facility] PT Assessment - 11/26/16 0001      Assessment   Medical Diagnosis Cervical DDD   Referring Provider Dr [name]   Onset Date/Surgical Date 03/19/16  long standing history of neck pain for ~2 yrs; worsening    Hand Dominance Right   Next MD Visit 5/18   Prior Therapy yes      Precautions   Precautions None     Balance Screen   Has the patient fallen in the past 6 months Yes   How many times? 2   Has the patient had a decrease in activity level because of a fear of falling?  No   Is the patient reluctant to leave their home because of a fear of falling?  No     Home Environment   Additional Comments multilevel - promarily on one level      Prior Function  Level of Independence Independent   Vocation On disability  2015   Leisure functional flexibility 3 x/wk; step class tuesday; household chores     Observation/Other Assessments   Focus on Therapeutic Outcomes (FOTO)  61% limitation      Sensation   Additional Comments intermittent numbness and tingling bilat hands due to carpal tunnel - wears splints at night      Posture/Postural Control   Posture Comments head forward; shoulders rounded and elevated; increased thoracic kyphosis; scapulae abducted and rotated on the thoracic wall      AROM   Right Shoulder Extension 43 Degrees   Right Shoulder Flexion 143 Degrees   Right Shoulder ABduction 154 Degrees   Right Shoulder Internal Rotation 44 Degrees   Right Shoulder External Rotation 93 Degrees   Left Shoulder Extension 44 Degrees   Left Shoulder Flexion 143 Degrees   Left Shoulder ABduction 139 Degrees   Left Shoulder  Internal Rotation 42 Degrees   Left Shoulder External Rotation 90 Degrees   Cervical Flexion 28   Cervical Extension 35   Cervical - Right Side Bend 23   Cervical - Left Side Bend 21   Cervical - Right Rotation 63   Cervical - Left Rotation 61     Strength   Overall Strength Comments 5-/5 to 5/5 bilat shoulders      Palpation   Palpation comment significant muscular tightness noted through the ant/lat/post cervical musculature; upper traps; leveator; pecs; teres; deltoid; triceps bilaterally      Special Tests    Special Tests --  (+) neural tension test bilat UE's                    [facility] Adult PT Treatment/Exercise - 11/26/16 0001      Neuro Re-ed    Neuro Re-ed Details  postural correction      Shoulder Exercises: Standing   Other Standing Exercises scap squeeze 10 sec x 10 w/noodle    Other Standing Exercises axial extension 15 sec x 5      Moist Heat Therapy   Number Minutes Moist Heat 15 Minutes   Moist Heat Location Cervical  thoracic bilat      Electrical Stimulation   Electrical Stimulation Location bilat post cervical and upper trap    Electrical Stimulation Action IFC   Electrical Stimulation Parameters  to tolerance   Electrical Stimulation Goals Pain;Tone                PT Education - 11/26/16 1619    Education provided Yes   Education Details HEP   Person(s) Educated Patient   Methods Explanation;Demonstration;Tactile cues;Verbal cues;Handout   Comprehension Verbalized understanding;Returned demonstration;Verbal cues required;Tactile cues required             PT Long Term Goals - 11/26/16 1826      PT LONG TERM GOAL #1   Title Improve posture and alignment - with scapulae in better alignment along the thoracic wall and head of the humerus more in anatomical position 01/08/17   Time 6   Period Weeks   Status New     PT LONG TERM GOAL #2   Title Increase AROM bilat shoulders allowing patient to reach overhead with greater  ease 01/08/17   Time 6   Period Weeks   Status New     PT LONG TERM GOAL #3   Title Decrease cervical pain with patient reporting 50% less pain for 50% of the day 01/08/17  Time 6   Period Weeks   Status New     PT LONG TERM GOAL #4   Title I in HEP 01/08/17   Time 6   Period Weeks   Status New     PT LONG TERM GOAL #5   Title Improve FOTO  to </= 48% limitation 01/08/17   Time 6   Period Weeks   Status New               Plan - 11/26/16 1821    Clinical Impression Statement Kelsey Snow presents with chronic cervical dysfunction with flare up of symtpoms on the past 6 months. She has poor posture and alignment; limited cervical and UE mobility and ROM; musculat tightness and significant tenderness to palpation through bilat cervical and thoracic areas; decreased functional activity and pain on a constant basis. She will benefit from trial of PT to address porblems identified.    Rehab Potential Fair   PT Frequency 2x / week   PT Duration 6 weeks   PT Treatment/Interventions Patient/family education;Neuromuscular re-education;ADLs/Self Care Home Management;Cryotherapy;Electrical Stimulation;Iontophoresis 4mg /ml Dexamethasone;Moist Heat;Ultrasound;Dry needling;Manual techniques;Therapeutic activities;Therapeutic exercise   PT Next Visit Plan postural correction; stretching - avoiding vigorous stretching for cervical spine due to carotid artery tear; posterior shoulder girdle strengthening; manual work and modalities as indicated    Oncologist with Plan of Care Patient      Patient will benefit from skilled therapeutic intervention in order to improve the following deficits and impairments:  Postural dysfunction, Improper body mechanics, Pain, Decreased range of motion, Decreased mobility, Increased muscle spasms, Increased fascial restricitons, Decreased activity tolerance  Visit Diagnosis: Cervicalgia - Plan: PT plan of care cert/re-cert  Abnormal posture - Plan: PT plan  of care cert/re-cert  Stiffness due to immobility - Plan: PT plan of care cert/re-cert  Other symptoms and signs involving the musculoskeletal system - Plan: PT plan of care cert/re-cert  Muscle weakness (generalized) - Plan: PT plan of care cert/re-cert     Problem List Patient Active Problem List   Diagnosis Date Noted  . Influenza-like illness 11/12/2016  . Cervical lymphadenopathy 10/29/2016  . Nevus 06/18/2016  . Dark stools 10/31/2015  . Essential hypertension, benign 03/07/2015  . Carpal tunnel syndrome, bilateral 11/29/2014  . 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 Cervical spondylosis without myelopathy 04/13/2014  . Perennial allergic rhinitis 11/16/2013  . Spotting 11/16/2013  . Skin tag 11/16/2013  . Obsessive compulsive disorder 10/10/2013  . Hypothyroidism 10/10/2013  . Obesity 10/10/2013  . Annual physical exam 10/10/2013  . Lumbar degenerative disc disease 10/10/2013  . Perimenopausal 10/10/2013     Nilda Simmer PT, MPH  11/26/2016, 6:32 PM  [facility] [place], [place], [address] Phone: [phone]   Fax:  [phone]  Name: Kelsey Snow MRN: [id number] Date of Birth: 03/02/1966

## 2016-11-26 NOTE — Patient Instructions (Signed)
Shoulder Blade Squeeze    Rotate shoulders back, then squeeze shoulder blades down and back . Hold 10 Repeat __10__ times. Do _2-3___ sessions per day. Can use noodle to help with positioning   Axial Extension (Chin Tuck)    Pull chin in and lengthen back of neck. Hold _10-15___ seconds while counting out loud. Repeat __5-7__ times. Do _several ___ sessions per day.  Marland Kitchen

## 2016-11-28 ENCOUNTER — Encounter: Payer: Self-pay | Admitting: Sports Medicine

## 2016-12-02 ENCOUNTER — Other Ambulatory Visit: Payer: Self-pay | Admitting: Sports Medicine

## 2016-12-03 ENCOUNTER — Encounter: Payer: Self-pay | Admitting: Physical Therapy

## 2016-12-03 ENCOUNTER — Ambulatory Visit (INDEPENDENT_AMBULATORY_CARE_PROVIDER_SITE_OTHER): Payer: BLUE CROSS/BLUE SHIELD | Admitting: Physical Therapy

## 2016-12-03 ENCOUNTER — Encounter: Payer: Self-pay | Admitting: Family

## 2016-12-03 DIAGNOSIS — Z7409 Other reduced mobility: Secondary | ICD-10-CM

## 2016-12-03 DIAGNOSIS — R293 Abnormal posture: Secondary | ICD-10-CM

## 2016-12-03 DIAGNOSIS — R29898 Other symptoms and signs involving the musculoskeletal system: Secondary | ICD-10-CM | POA: Diagnosis not present

## 2016-12-03 DIAGNOSIS — M623 Immobility syndrome (paraplegic): Secondary | ICD-10-CM

## 2016-12-03 DIAGNOSIS — M542 Cervicalgia: Secondary | ICD-10-CM | POA: Diagnosis not present

## 2016-12-03 DIAGNOSIS — M2560 Stiffness of unspecified joint, not elsewhere classified: Secondary | ICD-10-CM

## 2016-12-03 DIAGNOSIS — M6281 Muscle weakness (generalized): Secondary | ICD-10-CM

## 2016-12-03 NOTE — Therapy (Signed)
[facility] [place], [place], [address] Phone: [phone]   Fax:  [phone]  Physical Therapy Treatment  Patient Details  Name: Kelsey Snow MRN: [id number] Date of Birth: [birth year] Referring Provider: Dr [name]  Encounter Date: 12/03/2016      PT End of Session - 12/03/16 1057    Visit Number 2   Number of Visits 12   Date for PT Re-Evaluation 01/08/17   PT Start Time 1057   PT Stop Time 1153   PT Time Calculation (min) 56 min   Activity Tolerance Patient tolerated treatment well      Past Medical History:  Diagnosis Date  . Anemia   . Essential hypertension, benign 03/07/2015  . GERD (gastroesophageal reflux disease)   . Hx of ectopic pregnancy   . Hyperlipidemia   . Lumbosacral spondylolysis   . Obese   . OCD (obsessive compulsive disorder)   . Thyroid disease     Past Surgical History:  Procedure Laterality Date  . BACK SURGERY    . ECTOPIC PREGNANCY SURGERY    . TUBAL LIGATION    . TUBAL LIGATION      There were no vitals filed for this visit.      Subjective Assessment - 12/03/16 1058    Subjective Pt reports she did yoga yesteday, had trouble with down dog however overall liked the class and wants to continue with it.    Patient Stated Goals ease pain and learn what she can do at home    Currently in Pain? Yes   Pain Score 6    Pain Location Neck   Pain Orientation Left   Pain Descriptors / Indicators Aching   Pain Type Chronic pain   Pain Onset More than a month ago   Pain Frequency Constant   Aggravating Factors  planks and down dog in yoga this morning   Pain Relieving Factors heat, meds            [facility] PT Assessment - 12/03/16 0001      Assessment   Medical Diagnosis Cervical DDD     AROM   Cervical Flexion 27   Cervical Extension 32  with pain   Cervical - Right Side Bend 30   Cervical - Left Side Bend 21   Cervical - Right Rotation 54    Cervical - Left Rotation 58                     [facility] Adult PT Treatment/Exercise - 12/03/16 0001      Exercises   Exercises Neck     Neck Exercises: Machines for Strengthening   UBE (Upper Arm Bike) L2 x 4' alt FWD/BWD     Neck Exercises: Supine   Cervical Isometrics Extension;3 secs;10 reps   Cervical Isometrics Limitations 10 reps, 3 sec holds shoulder presses followed by shoulder depression   Other Supine Exercise 2x10, yellow band, overhead pull, horizontal abduction, ER, SASH     Modalities   Modalities Electrical Stimulation;Moist Heat     Moist Heat Therapy   Number Minutes Moist Heat 15 Minutes   Moist Heat Location Cervical  and thoracic area     Electrical Stimulation   Electrical Stimulation Location bilat post cervical and upper trap    Electrical Stimulation Action burst   Electrical Stimulation Parameters to tolerance - muscle twitch   Electrical Stimulation Goals Pain;Tone  PT Education - 12/03/16 1110    Education provided Yes   Education Details HEP progression   Person(s) Educated Patient   Methods Explanation;Demonstration;Handout   Comprehension Returned demonstration;Verbalized understanding             PT Long Term Goals - 12/03/16 1110      PT LONG TERM GOAL #1   Title Improve posture and alignment - with scapulae in better alignment along the thoracic wall and head of the humerus more in anatomical position 01/08/17   Status On-going     PT LONG TERM GOAL #2   Title Increase AROM bilat shoulders allowing patient to reach overhead with greater ease 01/08/17   Status On-going     PT LONG TERM GOAL #3   Title Decrease cervical pain with patient reporting 50% less pain for 50% of the day 01/08/17   Status On-going     PT LONG TERM GOAL #4   Title I in HEP 01/08/17   Status On-going     PT LONG TERM GOAL #5   Title Improve FOTO  to </= 48% limitation 01/08/17   Status On-going                Plan - 12/03/16 1112    Clinical Impression Statement This is Kelsey Snow's second visit, no goals met yet.  She is adding back in other exercises like yoga and is looking forward to her progress. She is weak through the posterior shoulder and upper back.    Rehab Potential Fair   PT Frequency 2x / week   PT Duration 6 weeks   PT Treatment/Interventions Patient/family education;Neuromuscular re-education;ADLs/Self Care Home Management;Cryotherapy;Electrical Stimulation;Iontophoresis 31m/ml Dexamethasone;Moist Heat;Ultrasound;Dry needling;Manual techniques;Therapeutic activities;Therapeutic exercise   PT Next Visit Plan postural correction; stretching - avoiding vigorous stretching for cervical spine due to carotid artery tear; posterior shoulder girdle strengthening; manual work and modalities as indicated    Consulted and Agree with Plan of Care Patient      Patient will benefit from skilled therapeutic intervention in order to improve the following deficits and impairments:  Postural dysfunction, Improper body mechanics, Pain, Decreased range of motion, Decreased mobility, Increased muscle spasms, Increased fascial restricitons, Decreased activity tolerance  Visit Diagnosis: Cervicalgia  Abnormal posture  Stiffness due to immobility  Other symptoms and signs involving the musculoskeletal system  Muscle weakness (generalized)     Problem List Patient Active Problem List   Diagnosis Date Noted  . Influenza-like illness 11/12/2016  . Cervical lymphadenopathy 10/29/2016  . Nevus 06/18/2016  . Dark stools 10/31/2015  . Essential hypertension, benign 03/07/2015  . Carpal tunnel syndrome, bilateral 11/29/2014  . Carotid artery dissection ([place] 04/26/2014  . Cervical spondylosis without myelopathy 04/13/2014  . Perennial allergic rhinitis 11/16/2013  . Spotting 11/16/2013  . Skin tag 11/16/2013  . Obsessive compulsive disorder 10/10/2013  . Hypothyroidism 10/10/2013  . Obesity  10/10/2013  . Annual physical exam 10/10/2013  . Lumbar degenerative disc disease 10/10/2013  . Perimenopausal 10/10/2013    SJeral PinchPT  12/03/2016, 11:39 AM  [facility] [address]Phone: [phone]  Fax:  [phone] Name: Kelsey BiglerMRN: [id number] of Birth: [birth year]

## 2016-12-03 NOTE — Patient Instructions (Addendum)
Over Head Pull: Narrow Grip     [place] [id number], Once a day.   On back, knees bent, feet flat, band across thighs, elbows straight but relaxed. Pull hands apart (start). Keeping elbows straight, bring arms up and over head, hands toward floor. Keep pull steady on band. Hold momentarily. Return slowly, keeping pull steady, back to start. Repeat _2x10__ times. Band color __yellow____   Side Pull: Double Arm   On back, knees bent, feet flat. Arms perpendicular to body, shoulder level, elbows straight but relaxed. Pull arms out to sides, elbows straight. Resistance band comes across collarbones, hands toward floor. Hold momentarily. Slowly return to starting position. Repeat _2x10__ times. Band color __yellow___   Sash   On back, knees bent, feet flat, left hand on left hip, right hand above left. Pull right arm DIAGONALLY (hip to shoulder) across chest. Bring right arm along head toward floor. Hold momentarily. Slowly return to starting position. Repeat _2x10__ times. Do with left arm. Band color _yelllow_____   Shoulder Rotation: Double Arm   On back, knees bent, feet flat, elbows tucked at sides, bent 90, hands palms up. Pull hands apart and down toward floor, keeping elbows near sides. Hold momentarily. Slowly return to starting position. Repeat _2x10__ times. Band color __yellow____

## 2016-12-04 ENCOUNTER — Ambulatory Visit (HOSPITAL_COMMUNITY)
Admission: RE | Admit: 2016-12-04 | Discharge: 2016-12-04 | Disposition: A | Discharge status: Home / Self Care | Payer: BLUE CROSS/BLUE SHIELD | Source: Ambulatory Visit | Attending: Family | Admitting: Family

## 2016-12-04 ENCOUNTER — Ambulatory Visit (INDEPENDENT_AMBULATORY_CARE_PROVIDER_SITE_OTHER): Payer: BLUE CROSS/BLUE SHIELD | Admitting: Family

## 2016-12-04 ENCOUNTER — Encounter: Payer: Self-pay | Admitting: Family

## 2016-12-04 VITALS — BP 132/75 | HR 60 | Temp 97.7°F | Resp 18 | Ht 64.0 in | Wt 261.8 lb

## 2016-12-04 DIAGNOSIS — I773 Arterial fibromuscular dysplasia: Secondary | ICD-10-CM | POA: Diagnosis not present

## 2016-12-04 DIAGNOSIS — I6522 Occlusion and stenosis of left carotid artery: Secondary | ICD-10-CM | POA: Diagnosis not present

## 2016-12-04 DIAGNOSIS — I7771 Dissection of carotid artery: Secondary | ICD-10-CM | POA: Diagnosis not present

## 2016-12-04 DIAGNOSIS — I720 Aneurysm of carotid artery: Secondary | ICD-10-CM | POA: Insufficient documentation

## 2016-12-04 NOTE — Progress Notes (Signed)
Chief Complaint: Follow up Extracranial Resolved Left ICA Dissection   History of Present Illness  Kelsey Snow is a 51 y.o. female who presents with chief complaint: routine follow-up.  Previous carotid studies demonstrated: RICA [id number] stenosis, LICA resolved dissection.  Patient has not had previous carotid artery intervention. She denies any known history of stroke or TIA. Specifically he denies a history of amaurosis fugax or monocular blindness, unilateral facial drooping, hemiplegia, or receptive or expressive aphasia.    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  She is receiving physical therapy for c-spine issues causing burning and pain at her trapezius area.   Pt Diabetic: no Pt smoker: non-smoker  Pt meds include: Statin : yes ASA: yes Other anticoagulants/antiplatelets: no   Past Medical History:  Diagnosis Date  . Anemia   . Essential hypertension, benign 03/07/2015  . GERD (gastroesophageal reflux disease)   . Hx of ectopic pregnancy   . Hyperlipidemia   . Lumbosacral spondylolysis   . Obese   . OCD (obsessive compulsive disorder)   . Thyroid disease     Social History Social History  Substance Use Topics  . Smoking status: Never Smoker  . Smokeless tobacco: Never Used  . Alcohol use 7.2 - 7.8 oz/week    6 - 7 Glasses of wine, 6 Standard drinks or equivalent per week    Family History Family History  Problem Relation Age of Onset  . Aneurysm Father   . AAA (abdominal aortic aneurysm) Father   . Depression Mother   . Cancer Mother     Pancreatic  . Thyroid disease Mother     hypothyroidism  . Varicose Veins Mother   . Drug abuse Sister   . Hyperlipidemia Brother   . Diabetes Maternal Grandmother   . Alcohol abuse Maternal Uncle     Surgical History Past Surgical History:  Procedure Laterality Date  . BACK SURGERY    . ECTOPIC PREGNANCY SURGERY    . TUBAL  LIGATION    . TUBAL LIGATION      Allergies  Allergen Reactions  . Bee Venom Anaphylaxis    Current Outpatient Prescriptions  Medication Sig Dispense Refill  . aspirin 81 MG EC tablet TAKE 1 TABLET (81 MG TOTAL) BY MOUTH DAILY. 90 tablet 3  . Cholecalciferol (VITAMIN D3) 2000 UNITS TABS Take by mouth 2 (two) times a week. TAKE 2 TABLETS Sunday AND 1 TABLET Tuesday AND Thursday.    . escitalopram (LEXAPRO) 20 MG tablet TAKE 1 TABLET (20 MG TOTAL) BY MOUTH DAILY. 90 tablet 1  . levothyroxine (SYNTHROID, LEVOTHROID) 150 MCG tablet TAKE ONE TABLET BY MOUTH DAILY BEFORE BREAKFAST 30 tablet 1  . Mag Aspart-Potassium Aspart (POTASSIUM & MAGNESIUM ASPARTAT) 250-250 MG CAPS Take by mouth. TAKE 2 CAPSULES Tuesday  AND Thursday  MORNING    . omeprazole (PRILOSEC) 40 MG capsule TAKE ONE CAPSULE BY MOUTH DAILY 90 capsule 0  . oxyCODONE-acetaminophen (PERCOCET/ROXICET) 5-325 MG tablet Take by mouth.    . rosuvastatin (CRESTOR) 10 MG tablet TAKE 1 TABLET (10 MG TOTAL) BY MOUTH DAILY. 30 tablet 2  . TraMADol HCl 300 MG CP24 Take 1 capsule by mouth daily. 90 capsule 0  . traZODone (DESYREL) 100 MG tablet TAKE 1 OR 2 TABLETS BY MOUTH AT BEDTIME (Patient taking differently: TAKE 1 -3 TABLETS BY MOUTH AT BEDTIME) 90 tablet 0  . benzonatate (TESSALON) 200 MG capsule Take 1 capsule (  200 mg total) by mouth 3 (three) times daily as needed for cough. (Patient not taking: Reported on 11/26/2016) 45 capsule 0  . Hydrocodone-Homatropine 5-1.5 MG TABS Take 1 tablet by mouth every 8 (eight) hours as needed (cough). (Patient not taking: Reported on 12/04/2016) 30 tablet 0   No current facility-administered medications for this visit.     Review of Systems : See HPI for pertinent positives and negatives.  Physical Examination  Vitals:   12/04/16 0859  BP: 132/75  Pulse: 60  Resp: 18  Temp: 97.7 F (36.5 C)  TempSrc: Oral  SpO2: 96%  Weight: 261 lb 12.8 oz (118.8 kg)  Height: 5\' 4"  (1.626 m)   Body mass index  is 44.94 kg/m.  General: A&O x 3, WD, morbidly obese female  Eyes: PERRLA  Neck: Supple, no nuchal rigidity, no palpable LAD  Pulmonary: Sym exp, respirations are non labored, good air movt in all fields, CTAB, no rales, rhonchi, or wheezing  Cardiac: RRR,  no detected murmur  Vascular: Carotid Palpable, without bruit Palpable, without bruit   Gastrointestinal: soft, NTND, no G/R, no HSM, no palpable masses, no CVAT B  Musculoskeletal: M/S 5/5 throughout , Extremities without ischemic changes   Neurologic: CN 2-12 intact, Pain and light touch intact in extremities, Motor exam as listed above     Assessment: Kelsey Snow is a 51 y.o. 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  She has no history of stroke or TIA.  Her blood pressure remains in control on no antihypertensive medications.    DATA Today's carotid duplex suggests no stenosis of the right ICA. Left ICA with 40-59% stenosis, no disease visualized, the segment is also tortuous.  Bilateral vertebral artery flow is antegrade.  Bilateral subclavian artery waveforms are normal.  Decrease in stenosis of right ICA compared to the last exam on 11-29-15.   Plan: Follow-up in 1 year with Carotid Duplex scan.   I discussed in depth with the patient the nature of atherosclerosis, and emphasized the importance of maximal medical management including strict control of blood pressure, blood glucose, and lipid levels, obtaining regular exercise, and continued cessation of smoking.  The patient is aware that without maximal medical management the underlying atherosclerotic disease process will progress, limiting the benefit of any interventions. The patient was given information about stroke prevention and what symptoms should prompt the patient to seek immediate medical care. Thank you for allowing [place] to participate in this patient's care.  Clemon Chambers, RN, MSN, FNP-C [facility] Office: [phone]  Clinic Physician: Donzetta Matters  12/04/16 9:04 AM

## 2016-12-04 NOTE — Patient Instructions (Signed)
Stroke Prevention Some medical conditions and behaviors are associated with an increased chance of having a stroke. You may prevent a stroke by making healthy choices and managing medical conditions. How can I reduce my risk of having a stroke?  Stay physically active. Get at least 30 minutes of activity on most or all days.  Do not smoke. It may also be helpful to avoid exposure to secondhand smoke.  Limit alcohol use. Moderate alcohol use is considered to be:  No more than 2 drinks per day for men.  No more than 1 drink per day for nonpregnant women.  Eat healthy foods. This involves:  Eating 5 or more servings of fruits and vegetables a day.  Making dietary changes that address high blood pressure (hypertension), high cholesterol, diabetes, or obesity.  Manage your cholesterol levels.  Making food choices that are high in fiber and low in saturated fat, trans fat, and cholesterol may control cholesterol levels.  Take any prescribed medicines to control cholesterol as directed by your health care provider.  Manage your diabetes.  Controlling your carbohydrate and sugar intake is recommended to manage diabetes.  Take any prescribed medicines to control diabetes as directed by your health care provider.  Control your hypertension.  Making food choices that are low in salt (sodium), saturated fat, trans fat, and cholesterol is recommended to manage hypertension.  Ask your health care provider if you need treatment to lower your blood pressure. Take any prescribed medicines to control hypertension as directed by your health care provider.  If you are 18-39 years of age, have your blood pressure checked every 3-5 years. If you are 40 years of age or older, have your blood pressure checked every year.  Maintain a healthy weight.  Reducing calorie intake and making food choices that are low in sodium, saturated fat, trans fat, and cholesterol are recommended to manage  weight.  Stop drug abuse.  Avoid taking birth control pills.  Talk to your health care provider about the risks of taking birth control pills if you are over 35 years old, smoke, get migraines, or have ever had a blood clot.  Get evaluated for sleep disorders (sleep apnea).  Talk to your health care provider about getting a sleep evaluation if you snore a lot or have excessive sleepiness.  Take medicines only as directed by your health care provider.  For some people, aspirin or blood thinners (anticoagulants) are helpful in reducing the risk of forming abnormal blood clots that can lead to stroke. If you have the irregular heart rhythm of atrial fibrillation, you should be on a blood thinner unless there is a good reason you cannot take them.  Understand all your medicine instructions.  Make sure that other conditions (such as anemia or atherosclerosis) are addressed. Get help right away if:  You have sudden weakness or numbness of the face, arm, or leg, especially on one side of the body.  Your face or eyelid droops to one side.  You have sudden confusion.  You have trouble speaking (aphasia) or understanding.  You have sudden trouble seeing in one or both eyes.  You have sudden trouble walking.  You have dizziness.  You have a loss of balance or coordination.  You have a sudden, severe headache with no known cause.  You have new chest pain or an irregular heartbeat. Any of these symptoms may represent a serious problem that is an emergency. Do not wait to see if the symptoms will go away.   Get medical help at once. Call your local emergency services (911 in [place] Do not drive yourself to the hospital. This information is not intended to replace advice given to you by your health care provider. Make sure you discuss any questions you have with your health care provider. Document Released: 11/12/2004 Document Revised: 03/12/2016 Document Reviewed: 04/07/2013 Elsevier  Interactive Patient Education  2017 Elsevier Inc.  

## 2016-12-07 ENCOUNTER — Ambulatory Visit (INDEPENDENT_AMBULATORY_CARE_PROVIDER_SITE_OTHER): Payer: BLUE CROSS/BLUE SHIELD | Admitting: Physical Therapy

## 2016-12-07 DIAGNOSIS — M623 Immobility syndrome (paraplegic): Secondary | ICD-10-CM | POA: Diagnosis not present

## 2016-12-07 DIAGNOSIS — R29898 Other symptoms and signs involving the musculoskeletal system: Secondary | ICD-10-CM

## 2016-12-07 DIAGNOSIS — R293 Abnormal posture: Secondary | ICD-10-CM | POA: Diagnosis not present

## 2016-12-07 DIAGNOSIS — Z7409 Other reduced mobility: Secondary | ICD-10-CM

## 2016-12-07 DIAGNOSIS — M542 Cervicalgia: Secondary | ICD-10-CM

## 2016-12-07 DIAGNOSIS — M2560 Stiffness of unspecified joint, not elsewhere classified: Secondary | ICD-10-CM

## 2016-12-07 NOTE — Therapy (Signed)
[facility]  [address] [place], [place], [address] Phone: [phone]   Fax:  [phone]  Physical Therapy Treatment  Patient Details  Name: Kelsey Snow MRN: [id number] Date of Birth: May 16, 1966 Referring Provider: Dr [name]  Encounter Date: 12/07/2016      PT End of Session - 12/07/16 1523    Visit Number 3   Number of Visits 12   Date for PT Re-Evaluation 01/08/17   PT Start Time 1519   PT Stop Time [id number]   PT Time Calculation (min) 55 min   Activity Tolerance Patient tolerated treatment well      Past Medical History:  Diagnosis Date  . Anemia   . Essential hypertension, benign 03/07/2015  . GERD (gastroesophageal reflux disease)   . Hx of ectopic pregnancy   . Hyperlipidemia   . Lumbosacral spondylolysis   . Obese   . OCD (obsessive compulsive disorder)   . Thyroid disease     Past Surgical History:  Procedure Laterality Date  . BACK SURGERY    . ECTOPIC PREGNANCY SURGERY    . TUBAL LIGATION    . TUBAL LIGATION      There were no vitals filed for this visit.      Subjective Assessment - 12/07/16 1523    Subjective Pt was moving furniture yesterday and her back is more troublesome today. Plans to go to functional yoga today.    Currently in Pain? Yes   Pain Score 5    Pain Location Neck   Pain Orientation Right;Left   Pain Descriptors / Indicators Aching   Aggravating Factors  planks and downward dog   Pain Relieving Factors heat, medication.             [facility] PT Assessment - 12/07/16 0001      AROM   Cervical Flexion 45   Cervical Extension 32  with pain   Cervical - Right Side Bend 25   Cervical - Left Side Bend 25           [facility] Adult PT Treatment/Exercise - 12/07/16 0001      Neck Exercises: Machines for Strengthening   UBE (Upper Arm Bike) L2 x 4' alt FWD/BWD     Neck Exercises: Seated   Other Seated Exercise rowing with yellow band x 10 x 2     Neck  Exercises: Supine   Cervical Isometrics Limitations 10 reps, 3 sec holds shoulder presses followed by shoulder depression   Other Supine Exercise 2x10, yellow band, overhead pull, horizontal abduction, ER, SASH     Shoulder Exercises: Standing   Other Standing Exercises axial extension 5 sec x 15      Moist Heat Therapy   Number Minutes Moist Heat 15 Minutes   Moist Heat Location Cervical  and thoracic area     Electrical Stimulation   Electrical Stimulation Location bilat post cervical and upper trap    Electrical Stimulation Action IFC   Electrical Stimulation Parameters to tolerance   Electrical Stimulation Goals Tone;Pain           PT Long Term Goals - 12/03/16 1110      PT LONG TERM GOAL #1   Title Improve posture and alignment - with scapulae in better alignment along the thoracic wall and head of the humerus more in anatomical position 01/08/17   Status On-going     PT LONG TERM GOAL #2   Title Increase AROM bilat shoulders allowing patient to reach overhead  with greater ease 01/08/17   Status On-going     PT LONG TERM GOAL #3   Title Decrease cervical pain with patient reporting 50% less pain for 50% of the day 01/08/17   Status On-going     PT LONG TERM GOAL #4   Title I in HEP 01/08/17   Status On-going     PT LONG TERM GOAL #5   Title Improve FOTO  to </= 48% limitation 01/08/17   Status On-going               Plan - 12/07/16 1601    Clinical Impression Statement Pt demonstrated improved cervical flexion ROM.  She had some mild increase in pain with postural strengthening with band exercises; eased with use of MHP and estim at end of session.  Pt making gradual progress towards goals.    Rehab Potential Fair   PT Frequency 2x / week   PT Duration 6 weeks   PT Treatment/Interventions Patient/family education;Neuromuscular re-education;ADLs/Self Care Home Management;Cryotherapy;Electrical Stimulation;Iontophoresis 4mg /ml Dexamethasone;Moist  Heat;Ultrasound;Dry needling;Manual techniques;Therapeutic activities;Therapeutic exercise   PT Next Visit Plan postural correction; stretching - avoiding vigorous stretching for cervical spine due to carotid artery tear; posterior shoulder girdle strengthening; manual work and modalities as indicated    Consulted and Agree with Plan of Care Patient      Patient will benefit from skilled therapeutic intervention in order to improve the following deficits and impairments:  Postural dysfunction, Improper body mechanics, Pain, Decreased range of motion, Decreased mobility, Increased muscle spasms, Increased fascial restricitons, Decreased activity tolerance  Visit Diagnosis: Cervicalgia  Abnormal posture  Stiffness due to immobility  Other symptoms and signs involving the musculoskeletal system     Problem List Patient Active Problem List   Diagnosis Date Noted  . Influenza-like illness 11/12/2016  . Cervical lymphadenopathy 10/29/2016  . Nevus 06/18/2016  . Dark stools 10/31/2015  . Essential hypertension, benign 03/07/2015  . Carpal tunnel syndrome, bilateral 11/29/2014  . Carotid artery dissection ([place]) 04/26/2014  . Cervical spondylosis without myelopathy 04/13/2014  . Perennial allergic rhinitis 11/16/2013  . Spotting 11/16/2013  . Skin tag 11/16/2013  . Obsessive compulsive disorder 10/10/2013  . Hypothyroidism 10/10/2013  . Obesity 10/10/2013  . Annual physical exam 10/10/2013  . Lumbar degenerative disc disease 10/10/2013  . Perimenopausal 10/10/2013   Kerin Perna, PTA 12/07/16 4:03 PM  [place] [address] [place], [place], [address] Phone: [phone]   Fax:  [phone]  Name: Kelsey Snow MRN: [id number] Date of Birth: 11-21-1965

## 2016-12-09 ENCOUNTER — Other Ambulatory Visit: Payer: Self-pay | Admitting: Sports Medicine

## 2016-12-09 ENCOUNTER — Ambulatory Visit (INDEPENDENT_AMBULATORY_CARE_PROVIDER_SITE_OTHER): Payer: BLUE CROSS/BLUE SHIELD | Admitting: Physical Therapy

## 2016-12-09 DIAGNOSIS — R293 Abnormal posture: Secondary | ICD-10-CM | POA: Diagnosis not present

## 2016-12-09 DIAGNOSIS — R29898 Other symptoms and signs involving the musculoskeletal system: Secondary | ICD-10-CM

## 2016-12-09 DIAGNOSIS — M542 Cervicalgia: Secondary | ICD-10-CM

## 2016-12-09 DIAGNOSIS — M623 Immobility syndrome (paraplegic): Secondary | ICD-10-CM

## 2016-12-09 DIAGNOSIS — Z7409 Other reduced mobility: Secondary | ICD-10-CM

## 2016-12-09 DIAGNOSIS — M2560 Stiffness of unspecified joint, not elsewhere classified: Secondary | ICD-10-CM

## 2016-12-09 DIAGNOSIS — M6281 Muscle weakness (generalized): Secondary | ICD-10-CM

## 2016-12-09 NOTE — Therapy (Signed)
[facility] [place], [place], [address] Phone: [phone]   Fax:  [phone]  Physical Therapy Treatment  Patient Details  Name: Kelsey Snow MRN: [id number] Date of Birth: June 12, 1966 Referring Provider: Dr [name]  Encounter Date: 12/09/2016      PT End of Session - 12/09/16 1106    Visit Number 4   Number of Visits 12   Date for PT Re-Evaluation 01/08/17   PT Start Time 1106   PT Stop Time 1146   PT Time Calculation (min) 40 min   Activity Tolerance Patient limited by pain      Past Medical History:  Diagnosis Date  . Anemia   . Essential hypertension, benign 03/07/2015  . GERD (gastroesophageal reflux disease)   . Hx of ectopic pregnancy   . Hyperlipidemia   . Lumbosacral spondylolysis   . Obese   . OCD (obsessive compulsive disorder)   . Thyroid disease     Past Surgical History:  Procedure Laterality Date  . BACK SURGERY    . ECTOPIC PREGNANCY SURGERY    . TUBAL LIGATION    . TUBAL LIGATION      There were no vitals filed for this visit.      Subjective Assessment - 12/09/16 1106    Subjective Pt is muscular sore today from her classes, they have done a lot of planks an dupper body work.    Patient Stated Goals ease pain and learn what she can do at home    Currently in Pain? Yes   Pain Score 9    Pain Location Neck   Pain Orientation Left;Right   Pain Descriptors / Indicators Sore   Pain Type Chronic pain                         [facility] Adult PT Treatment/Exercise - 12/09/16 0001      Neck Exercises: Machines for Strengthening   UBE (Upper Arm Bike) L2 x 4' alt FWD/BWD     Neck Exercises: Supine   Shoulder ABduction 15 reps;Both  horizontal abduction yellow band   Shoulder Abduction Limitations 15 reps shoulder ER yellow band   Upper Extremity Flexion with Stabilization 20 reps  for stretching with strap around  wrists   Other Supine Exercise  20 reps shoulder presses & head presses into ball, 10 reps thoracic lifts     Modalities   Modalities Electrical Stimulation;Moist Heat     Moist Heat Therapy   Number Minutes Moist Heat 15 Minutes   Moist Heat Location Cervical  heat to lumbar while on cervical      Electrical Stimulation   Electrical Stimulation Location bilat post cervical and upper trap    Electrical Stimulation Action IFC   Electrical Stimulation Parameters  to tolerance   Electrical Stimulation Goals Tone;Pain                     PT Long Term Goals - 12/03/16 1110      PT LONG TERM GOAL #1   Title Improve posture and alignment - with scapulae in better alignment along the thoracic wall and head of the humerus more in anatomical position 01/08/17   Status On-going     PT LONG TERM GOAL #2   Title Increase AROM bilat shoulders allowing patient to reach overhead with greater ease 01/08/17   Status On-going     PT LONG TERM GOAL #3  Title Decrease cervical pain with patient reporting 50% less pain for 50% of the day 01/08/17   Status On-going     PT LONG TERM GOAL #4   Title I in HEP 01/08/17   Status On-going     PT LONG TERM GOAL #5   Title Improve FOTO  to </= 48% limitation 01/08/17   Status On-going               Plan - 12/09/16 1130    Clinical Impression Statement Kelsey Snow was more sore today as she has done a lot of plank exercises that placed stress on her shoulders and neck. She did get some relief with gentle exercise and stetching.    Rehab Potential Fair   PT Frequency 2x / week   PT Duration 6 weeks   PT Treatment/Interventions Patient/family education;Neuromuscular re-education;ADLs/Self Care Home Management;Cryotherapy;Electrical Stimulation;Iontophoresis 4mg /ml Dexamethasone;Moist Heat;Ultrasound;Dry needling;Manual techniques;Therapeutic activities;Therapeutic exercise   PT Next Visit Plan postural correction; stretching - avoiding vigorous stretching for cervical spine  due to carotid artery tear; posterior shoulder girdle strengthening; manual work and modalities as indicated    Consulted and Agree with Plan of Care Patient      Patient will benefit from skilled therapeutic intervention in order to improve the following deficits and impairments:  Postural dysfunction, Improper body mechanics, Pain, Decreased range of motion, Decreased mobility, Increased muscle spasms, Increased fascial restricitons, Decreased activity tolerance  Visit Diagnosis: Cervicalgia  Abnormal posture  Stiffness due to immobility  Other symptoms and signs involving the musculoskeletal system  Muscle weakness (generalized)     Problem List Patient Active Problem List   Diagnosis Date Noted  . Influenza-like illness 11/12/2016  . Cervical lymphadenopathy 10/29/2016  . Nevus 06/18/2016  . Dark stools 10/31/2015  . Essential hypertension, benign 03/07/2015  . Carpal tunnel syndrome, bilateral 11/29/2014  . Carotid artery dissection ([place]) 04/26/2014  . Cervical spondylosis without myelopathy 04/13/2014  . Perennial allergic rhinitis 11/16/2013  . Spotting 11/16/2013  . Skin tag 11/16/2013  . Obsessive compulsive disorder 10/10/2013  . Hypothyroidism 10/10/2013  . Obesity 10/10/2013  . Annual physical exam 10/10/2013  . Lumbar degenerative disc disease 10/10/2013  . Perimenopausal 10/10/2013    Jeral Pinch PT  12/09/2016, 11:33 AM  [facility] [place], [place], [address] Phone: [phone]   Fax:  [phone]  Name: Kelsey Snow MRN: [id number] Date of Birth: 15-Jul-1966

## 2016-12-10 NOTE — Addendum Note (Signed)
Addended by: Lianne Cure A on: 12/10/2016 03:50 PM   Modules accepted: Orders

## 2016-12-21 ENCOUNTER — Ambulatory Visit (INDEPENDENT_AMBULATORY_CARE_PROVIDER_SITE_OTHER): Payer: BLUE CROSS/BLUE SHIELD | Admitting: Physical Therapy

## 2016-12-21 DIAGNOSIS — R293 Abnormal posture: Secondary | ICD-10-CM | POA: Diagnosis not present

## 2016-12-21 DIAGNOSIS — M542 Cervicalgia: Secondary | ICD-10-CM

## 2016-12-21 DIAGNOSIS — M623 Immobility syndrome (paraplegic): Secondary | ICD-10-CM

## 2016-12-21 DIAGNOSIS — R29898 Other symptoms and signs involving the musculoskeletal system: Secondary | ICD-10-CM | POA: Diagnosis not present

## 2016-12-21 DIAGNOSIS — M2560 Stiffness of unspecified joint, not elsewhere classified: Secondary | ICD-10-CM

## 2016-12-21 DIAGNOSIS — Z7409 Other reduced mobility: Secondary | ICD-10-CM

## 2016-12-21 NOTE — Therapy (Signed)
[place], [place], [address] Phone: [phone]   Fax:  [phone]  Physical Therapy Treatment  Patient Details  Name: Kelsey Snow MRN: [id number] Date of Birth: 1966/04/14 Referring Provider: Dr [name]  Encounter Date: 12/21/2016      PT End of Session - 12/21/16 1531    Visit Number 5   Number of Visits 12   Date for PT Re-Evaluation 01/08/17   PT Start Time [id number]  pt arrived late   PT Stop Time 1617   PT Time Calculation (min) 52 min   Activity Tolerance Patient limited by pain      Past Medical History:  Diagnosis Date  . Anemia   . Essential hypertension, benign 03/07/2015  . GERD (gastroesophageal reflux disease)   . Hx of ectopic pregnancy   . Hyperlipidemia   . Lumbosacral spondylolysis   . Obese   . OCD (obsessive compulsive disorder)   . Thyroid disease     Past Surgical History:  Procedure Laterality Date  . BACK SURGERY    . ECTOPIC PREGNANCY SURGERY    . TUBAL LIGATION    . TUBAL LIGATION      There were no vitals filed for this visit.      Subjective Assessment - 12/21/16 1531    Subjective Pt did spring cleaning yesterday; up on step ladder taking decals on kitchen cabinets. She plans to participate in functional flexibility class tonight    Currently in Pain? Yes   Pain Score 8    Pain Location Neck   Pain Orientation Right;Left   Pain Descriptors / Indicators Aching   Aggravating Factors  planks   Pain Relieving Factors heat, medication   Multiple Pain Sites Yes   Pain Score 10  10   Pain Location Back   Pain Orientation Right;Left   Aggravating Factors  squatting   Pain Relieving Factors medicine, rest.             [facility] PT Assessment - 12/21/16 0001      AROM   Right Shoulder Flexion 140 Degrees  supine    Left Shoulder Flexion 135 Degrees  supine          [facility] Adult PT Treatment/Exercise - 12/21/16 0001      Neck  Exercises: Machines for Strengthening   Other Machines for Strengthening NuStep L5: (arms/legs) x 5 min      Neck Exercises: Supine   Shoulder Flexion Both;10 reps  overhead pull, red x 10, yellow x 10   Other Supine Exercise 20 reps shoulder presses & head presses into ball, 10 reps thoracic lifts   Other Supine Exercise D2 flexion (sash) with yellow x 10 reps  each arm; horiz abdct red band x 10 reps; bilat ER with yellow x 10      Modalities   Modalities Electrical Stimulation;Moist Heat     Moist Heat Therapy   Number Minutes Moist Heat 15 Minutes   Moist Heat Location Cervical  heat to lumbar while on cervical      Electrical Stimulation   Electrical Stimulation Location bilat post cervical and upper trap    Electrical Stimulation Action IFC    Electrical Stimulation Parameters to tolerance   Electrical Stimulation Goals Tone;Pain     Manual Therapy   Manual Therapy Myofascial release;Soft tissue mobilization  Pt lying in supine    Myofascial Release MFR to bilat upper trap; Rt scalenes; suboccipital release.  PT Long Term Goals - 12/21/16 1604      PT LONG TERM GOAL #1   Title Improve posture and alignment - with scapulae in better alignment along the thoracic wall and head of the humerus more in anatomical position 01/08/17   Time 6   Period Weeks   Status On-going     PT LONG TERM GOAL #2   Title Increase AROM bilat shoulders allowing patient to reach overhead with greater ease 01/08/17   Time 6   Period Weeks   Status On-going     PT LONG TERM GOAL #3   Title Decrease cervical pain with patient reporting 50% less pain for 50% of the day 01/08/17   Time 6   Period Weeks   Status On-going     PT LONG TERM GOAL #4   Title I in HEP 01/08/17   Time 6   Period Weeks   Status On-going     PT LONG TERM GOAL #5   Title Improve FOTO  to </= 48% limitation 01/08/17   Time 6   Period Weeks   Status On-going               Plan - 12/21/16  1600    Clinical Impression Statement Pt had difficulty tolerating exercises today in supine due to high level of pain in low back and neck due to spring cleaning yesterday.  She reported slight decrease in pain with manual therapy and further reduction with use of MHP/estim at end of session.  Pt making very gradual progress towards goals.    Rehab Potential Fair   PT Frequency 2x / week   PT Duration 6 weeks   PT Next Visit Plan postural correction; stretching - avoiding vigorous stretching for cervical spine due to carotid artery tear; posterior shoulder girdle strengthening; manual work and modalities as indicated    Consulted and Agree with Plan of Care Patient      Patient will benefit from skilled therapeutic intervention in order to improve the following deficits and impairments:  Postural dysfunction, Improper body mechanics, Pain, Decreased range of motion, Decreased mobility, Increased muscle spasms, Increased fascial restricitons, Decreased activity tolerance  Visit Diagnosis: Cervicalgia  Abnormal posture  Stiffness due to immobility  Other symptoms and signs involving the musculoskeletal system     Problem List Patient Active Problem List   Diagnosis Date Noted  . Influenza-like illness 11/12/2016  . Cervical lymphadenopathy 10/29/2016  . Nevus 06/18/2016  . Dark stools 10/31/2015  . Essential hypertension, benign 03/07/2015  . Carpal tunnel syndrome, bilateral 11/29/2014  . Carotid artery dissection ([place]) 04/26/2014  . Cervical spondylosis without myelopathy 04/13/2014  . Perennial allergic rhinitis 11/16/2013  . Spotting 11/16/2013  . Skin tag 11/16/2013  . Obsessive compulsive disorder 10/10/2013  . Hypothyroidism 10/10/2013  . Obesity 10/10/2013  . Annual physical exam 10/10/2013  . Lumbar degenerative disc disease 10/10/2013  . Perimenopausal 10/10/2013   Kerin Perna, PTA 12/21/16 4:57 PM  [place], [place], [address] Phone: [phone]   Fax:  [phone]  Name: Kelsey Snow MRN: [id number] Date of Birth: [birth year]

## 2016-12-23 ENCOUNTER — Encounter: Payer: Self-pay | Admitting: Physical Therapy

## 2016-12-30 ENCOUNTER — Ambulatory Visit (INDEPENDENT_AMBULATORY_CARE_PROVIDER_SITE_OTHER): Payer: BLUE CROSS/BLUE SHIELD | Admitting: Physical Therapy

## 2016-12-30 DIAGNOSIS — R29898 Other symptoms and signs involving the musculoskeletal system: Secondary | ICD-10-CM | POA: Diagnosis not present

## 2016-12-30 DIAGNOSIS — M542 Cervicalgia: Secondary | ICD-10-CM | POA: Diagnosis not present

## 2016-12-30 DIAGNOSIS — R293 Abnormal posture: Secondary | ICD-10-CM

## 2016-12-30 DIAGNOSIS — Z7409 Other reduced mobility: Secondary | ICD-10-CM

## 2016-12-30 DIAGNOSIS — M623 Immobility syndrome (paraplegic): Secondary | ICD-10-CM

## 2016-12-30 DIAGNOSIS — M2560 Stiffness of unspecified joint, not elsewhere classified: Secondary | ICD-10-CM

## 2016-12-30 NOTE — Therapy (Signed)
[place] [address] [place], [place], [address] Phone: [phone]   Fax:  [phone]  Physical Therapy Treatment  Patient Details  Name: Kelsey Snow MRN: [id number] Date of Birth: 10-12-1966 Referring Provider: Dr. [name]  Encounter Date: 12/30/2016      PT End of Session - 12/30/16 1520    Visit Number 6   Number of Visits 12   Date for PT Re-Evaluation 01/08/17   PT Start Time 1444  pt arrived late   PT Stop Time 1531   PT Time Calculation (min) 47 min   Activity Tolerance Patient tolerated treatment well;Patient limited by pain      Past Medical History:  Diagnosis Date  . Anemia   . Essential hypertension, benign 03/07/2015  . GERD (gastroesophageal reflux disease)   . Hx of ectopic pregnancy   . Hyperlipidemia   . Lumbosacral spondylolysis   . Obese   . OCD (obsessive compulsive disorder)   . Thyroid disease     Past Surgical History:  Procedure Laterality Date  . BACK SURGERY    . ECTOPIC PREGNANCY SURGERY    . TUBAL LIGATION    . TUBAL LIGATION      There were no vitals filed for this visit.      Subjective Assessment - 12/30/16 1448    Subjective Pt reports her neck pain has eased up, but she has not been doing much around house lately.  She reports continued limitation when reaching arms for overhead tasks.     Pertinent History L5/S1 fusion; multiple injections; fibromuscular dysplasia; connective tissue disorder bilat cervical spine;  Lt carotid artery tear partially healed; bilat carpal tunnel    Patient Stated Goals ease pain and learn what she can do at home    Currently in Pain? Yes   Pain Location Neck   Pain Orientation Left;Right   Pain Radiating Towards into both shoulders    Aggravating Factors  planks, overhead activities   Pain Relieving Factors heat, medication   Pain Score 8   Pain Location Back   Pain Orientation Right;Left   Pain Descriptors /  Indicators Aching   Aggravating Factors  standing, walking   Pain Relieving Factors sitting with hips at 90 deg angle, medicine, rest.             [facility] PT Assessment - 12/30/16 0001      Assessment   Medical Diagnosis Cervical DDD   Referring Provider Dr. [name]   Onset Date/Surgical Date 03/19/16   Hand Dominance Right   Next MD Visit 5/18     AROM   Right Shoulder Flexion 145 Degrees  supine    Right Shoulder External Rotation 75 Degrees  supine, shoulder abdct 90 deg   Left Shoulder Flexion 140 Degrees  supine   Left Shoulder External Rotation 85 Degrees  supine, shoulder abdct 90 deg   Cervical - Right Rotation 70  supine   Cervical - Left Rotation 62                     [facility] Adult PT Treatment/Exercise - 12/30/16 0001      Neck Exercises: Machines for Strengthening   UBE (Upper Arm Bike) L2 x 4' alt FWD/BWD     Neck Exercises: Supine   Cervical Rotation Right;Left;5 reps   Shoulder Flexion Both;10 reps  overhead pull, yellow band x 10 reps, 2 sets   Other Supine Exercise head presses into ball, 5 sec  x 10 reps   Other Supine Exercise D2 flexion (sash) with yellow x 10 reps  each arm; scap retraction with yellow band x 10 reps, 2 setsa     Moist Heat Therapy   Number Minutes Moist Heat 15 Minutes   Moist Heat Location Cervical  heat to lumbar while on cervical      Electrical Stimulation   Electrical Stimulation Location bilat post cervical and upper trap    Electrical Stimulation Action IFC   Electrical Stimulation Parameters to tolerance   Electrical Stimulation Goals Pain            PT Long Term Goals - 12/30/16 1451      PT LONG TERM GOAL #1   Title Improve posture and alignment - with scapulae in better alignment along the thoracic wall and head of the humerus more in anatomical position 01/08/17   Time 6   Period Weeks   Status On-going     PT LONG TERM GOAL #2   Title Increase AROM bilat shoulders allowing patient to  reach overhead with greater ease 01/08/17   Time 6   Period Weeks   Status On-going     PT LONG TERM GOAL #3   Title Decrease cervical pain with patient reporting 50% less pain for 50% of the day 01/08/17   Time 6   Period Weeks   Status Partially Met  dependent on activities around house     PT LONG TERM GOAL #4   Title I in HEP 01/08/17   Time 6   Period Weeks   Status On-going     PT LONG TERM GOAL #5   Title Improve FOTO  to </= 48% limitation 01/08/17   Time 6   Period Weeks   Status On-going               Plan - 12/30/16 1518    Clinical Impression Statement Pt demonstrated slight improvement in Rt shoulder flexion and cervical rotation ROM.  Pt reported decrease in pain at end of session.  Pt's pain varies day to day depending on activities she engages in.  Pt with limited progress towards established goals.  Treatment limited due to her late arrival.    Rehab Potential Fair   PT Frequency 2x / week   PT Duration 6 weeks   PT Treatment/Interventions Patient/family education;Neuromuscular re-education;ADLs/Self Care Home Management;Cryotherapy;Electrical Stimulation;Iontophoresis 19m/ml Dexamethasone;Moist Heat;Ultrasound;Dry needling;Manual techniques;Therapeutic activities;Therapeutic exercise   PT Next Visit Plan postural correction; stretching - avoiding vigorous stretching for cervical spine due to carotid artery tear; posterior shoulder girdle strengthening; manual work and modalities as indicated       Patient will benefit from skilled therapeutic intervention in order to improve the following deficits and impairments:  Postural dysfunction, Improper body mechanics, Pain, Decreased range of motion, Decreased mobility, Increased muscle spasms, Increased fascial restricitons, Decreased activity tolerance  Visit Diagnosis: Cervicalgia  Abnormal posture  Stiffness due to immobility  Other symptoms and signs involving the musculoskeletal system     Problem  List Patient Active Problem List   Diagnosis Date Noted  . Influenza-like illness 11/12/2016  . Cervical lymphadenopathy 10/29/2016  . Nevus 06/18/2016  . Dark stools 10/31/2015  . Essential hypertension, benign 03/07/2015  . Carpal tunnel syndrome, bilateral 11/29/2014  . [place] 04/26/2014  . Cervical spondylosis without myelopathy 04/13/2014  . Perennial allergic rhinitis 11/16/2013  . Spotting 11/16/2013  . Skin tag 11/16/2013  . Obsessive compulsive disorder 10/10/2013  . Hypothyroidism  10/10/2013  . Obesity 10/10/2013  . Annual physical exam 10/10/2013  . Lumbar degenerative disc disease 10/10/2013  . Perimenopausal 10/10/2013   Kerin Perna, PTA 12/30/16 5:14 PM [place], [place], [address] Phone: [phone]   Fax:  [phone]  Name: Kelsey Snow MRN: [id number] Date of Birth: [birth year]

## 2017-01-01 ENCOUNTER — Ambulatory Visit (INDEPENDENT_AMBULATORY_CARE_PROVIDER_SITE_OTHER): Payer: BLUE CROSS/BLUE SHIELD | Admitting: Physical Therapy

## 2017-01-01 DIAGNOSIS — Z7409 Other reduced mobility: Secondary | ICD-10-CM

## 2017-01-01 DIAGNOSIS — M542 Cervicalgia: Secondary | ICD-10-CM

## 2017-01-01 DIAGNOSIS — M2560 Stiffness of unspecified joint, not elsewhere classified: Secondary | ICD-10-CM

## 2017-01-01 DIAGNOSIS — M623 Immobility syndrome (paraplegic): Secondary | ICD-10-CM | POA: Diagnosis not present

## 2017-01-01 DIAGNOSIS — R293 Abnormal posture: Secondary | ICD-10-CM

## 2017-01-01 DIAGNOSIS — R29898 Other symptoms and signs involving the musculoskeletal system: Secondary | ICD-10-CM | POA: Diagnosis not present

## 2017-01-01 NOTE — Therapy (Signed)
[place], [place], [address] Phone: [phone]   Fax:  [phone]  Physical Therapy Treatment  Patient Details  Name: Kelsey Snow MRN: [id number] Date of Birth: January 06, 1966 Referring Provider: Dr. [name]  Encounter Date: 01/01/2017      PT End of Session - 01/01/17 1429    Visit Number 7   Number of Visits 12   Date for PT Re-Evaluation 01/08/17   PT Start Time 1408   PT Stop Time 1502   PT Time Calculation (min) 54 min      Past Medical History:  Diagnosis Date  . Anemia   . Essential hypertension, benign 03/07/2015  . GERD (gastroesophageal reflux disease)   . Hx of ectopic pregnancy   . Hyperlipidemia   . Lumbosacral spondylolysis   . Obese   . OCD (obsessive compulsive disorder)   . Thyroid disease     Past Surgical History:  Procedure Laterality Date  . BACK SURGERY    . ECTOPIC PREGNANCY SURGERY    . TUBAL LIGATION    . TUBAL LIGATION      There were no vitals filed for this visit.      Subjective Assessment - 01/01/17 1417    Subjective Pt reports she has not been very busy lately, therefore her neck and back are feeling not as bad. She continues to limit any arm movement overhead to avoid increased neck pain.   She's had a "grabbing" muscle spasm in her Lt neck that has been sporadic.     Pertinent History L5/S1 fusion; multiple injections; fibromuscular dysplasia; connective tissue disorder bilat cervical spine;  Lt carotid artery tear partially healed; bilat carpal tunnel    Currently in Pain? Yes   Pain Score 4    Pain Location Neck   Pain Orientation Right;Left   Aggravating Factors  overhead activities   Pain Relieving Factors heat, medication.             [facility] PT Assessment - 01/01/17 0001      Assessment   Medical Diagnosis Cervical DDD   Referring Provider Dr. [name]   Onset Date/Surgical Date 03/19/16   Hand Dominance Right    Next MD Visit 5/18          [facility] Adult PT Treatment/Exercise - 01/01/17 0001      Neck Exercises: Machines for Strengthening   Other Machines for Strengthening NuStep L5: (arms/legs) x 7 min      Neck Exercises: Seated   Other Seated Exercise rowing with yellow band x 10 x 2     Neck Exercises: Prone   Other Prone Exercise --   Other Prone Exercise scap retraction with shoulder ext to neutral, axial ext. x 10 reps     Shoulder Exercises: Standing   Other Standing Exercises scap squeeze 10 sec x 10 w/noodle; Lt UE moving rings Lt to/from Rt on arch (max height of flex '@90'  deg) x 12 rings, 2 sets.  bilat shoulder flex, walking ball up wall x 8 reps; bilat shoulder ER with yellow band x 10 reps x 2 sets   Other Standing Exercises low and mid level doorway stretch x 30 sec x 2 reps      Moist Heat Therapy   Number Minutes Moist Heat 15 Minutes   Moist Heat Location Cervical;Lumbar Spine     Electrical Stimulation   Electrical Stimulation Location bilat post cervical and upper trap    Electrical Stimulation Action IFC  Electrical Stimulation Parameters  to tolerance    Electrical Stimulation Goals Pain                     PT Long Term Goals - 12/30/16 1451      PT LONG TERM GOAL #1   Title Improve posture and alignment - with scapulae in better alignment along the thoracic wall and head of the humerus more in anatomical position 01/08/17   Time 6   Period Weeks   Status On-going     PT LONG TERM GOAL #2   Title Increase AROM bilat shoulders allowing patient to reach overhead with greater ease 01/08/17   Time 6   Period Weeks   Status On-going     PT LONG TERM GOAL #3   Title Decrease cervical pain with patient reporting 50% less pain for 50% of the day 01/08/17   Time 6   Period Weeks   Status Partially Met  dependent on activities around house     PT [place] #4   Title I in HEP 01/08/17   Time 6   Period Weeks   Status On-going     PT LONG  TERM GOAL #5   Title Improve FOTO  to </= 48% limitation 01/08/17   Time 6   Period Weeks   Status On-going           Plan - 01/01/17 1623    Clinical Impression Statement Pt tolerated new exercises with minimal increase in pain, just fatigue in neck and shoulder.  Pt making gradual progress towards goals.    Rehab Potential Fair   PT Frequency 2x / week   PT Duration 6 weeks   PT Treatment/Interventions Patient/family education;Neuromuscular re-education;ADLs/Self Care Home Management;Cryotherapy;Electrical Stimulation;Iontophoresis 76m/ml Dexamethasone;Moist Heat;Ultrasound;Dry needling;Manual techniques;Therapeutic activities;Therapeutic exercise   PT Next Visit Plan postural correction; stretching; progress HEP if prone exercises tolerated.  - Will continue to avoid vigorous stretching for cervical spine due to carotid artery tear; posterior shoulder girdle strengthening; manual work and modalities as indicated    Consulted and Agree with Plan of Care Patient      Patient will benefit from skilled therapeutic intervention in order to improve the following deficits and impairments:  Postural dysfunction, Improper body mechanics, Pain, Decreased range of motion, Decreased mobility, Increased muscle spasms, Increased fascial restricitons, Decreased activity tolerance  Visit Diagnosis: Cervicalgia  Abnormal posture  Stiffness due to immobility  Other symptoms and signs involving the musculoskeletal system     Problem List Patient Active Problem List   Diagnosis Date Noted  . Influenza-like illness 11/12/2016  . Cervical lymphadenopathy 10/29/2016  . Nevus 06/18/2016  . Dark stools 10/31/2015  . Essential hypertension, benign 03/07/2015  . Carpal tunnel syndrome, bilateral 11/29/2014  . [place] 04/26[place]2014  . Cervical spondylosis without myelopathy 04/13/2014  . Perennial allergic rhinitis 11/16/2013  . Spotting 11/16/2013  . Skin tag 11/16/2013   . Obsessive compulsive disorder 10/10/2013  . Hypothyroidism 10/10/2013  . Obesity 10/10/2013  . Annual physical exam 10/10/2013  . Lumbar degenerative disc disease 10/10/2013  . Perimenopausal 10/10/2013   JKerin Perna PTA 01/01/17 4:31 PM  [facility] [place] [address]Phone: [phone]  Fax:  [phone] Name: TZelta EnfieldMRN: [id number] of Birth: [birth year]

## 2017-01-02 ENCOUNTER — Encounter: Payer: Self-pay | Admitting: Sports Medicine

## 2017-01-02 DIAGNOSIS — E039 Hypothyroidism, unspecified: Secondary | ICD-10-CM

## 2017-01-04 ENCOUNTER — Ambulatory Visit (INDEPENDENT_AMBULATORY_CARE_PROVIDER_SITE_OTHER): Payer: BLUE CROSS/BLUE SHIELD | Admitting: Physical Therapy

## 2017-01-04 DIAGNOSIS — M542 Cervicalgia: Secondary | ICD-10-CM

## 2017-01-04 DIAGNOSIS — R293 Abnormal posture: Secondary | ICD-10-CM

## 2017-01-04 NOTE — Therapy (Signed)
[place], [place], [address] Phone: [phone]   Fax:  [phone]  Physical Therapy Treatment/Recertification  Patient Details  Name: Kelsey Snow MRN: [id number] Date of Birth: 1966-06-17 Referring Provider: Dr. [name]  Encounter Date: 01/04/2017      PT End of Session - 01/04/17 1600    Visit Number 8   Number of Visits 14   Date for PT Re-Evaluation 02/15/17   PT Start Time 1520   PT Stop Time 1613   PT Time Calculation (min) 53 min   Activity Tolerance Patient tolerated treatment well;Patient limited by pain   Behavior During Therapy [facility] for tasks assessed/performed      Past Medical History:  Diagnosis Date  . Anemia   . Essential hypertension, benign 03/07/2015  . GERD (gastroesophageal reflux disease)   . Hx of ectopic pregnancy   . Hyperlipidemia   . Lumbosacral spondylolysis   . Obese   . OCD (obsessive compulsive disorder)   . Thyroid disease     Past Surgical History:  Procedure Laterality Date  . BACK SURGERY    . ECTOPIC PREGNANCY SURGERY    . TUBAL LIGATION    . TUBAL LIGATION      There were no vitals filed for this visit.      Subjective Assessment - 01/04/17 1528    Subjective doing well.  pain in neck is "about a 5."  reports she did over 6000 steps yesterday.   Pertinent History L5/S1 fusion; multiple injections; fibromuscular dysplasia; connective tissue disorder bilat cervical spine;  Lt carotid artery tear partially healed; bilat carpal tunnel    Patient Stated Goals ease pain and learn what she can do at home    Currently in Pain? Yes   Pain Score 5    Pain Location Neck   Pain Orientation Right;Left   Pain Descriptors / Indicators Aching   Pain Type Chronic pain   Pain Onset More than a month ago   Pain Frequency Constant   Aggravating Factors  overhead activities   Pain Relieving Factors heat, medication            [facility] PT  Assessment - 01/04/17 1559      Observation/Other Assessments   Focus on Therapeutic Outcomes (FOTO)  45 (55% limited)     Posture/Postural Control   Posture/Postural Control Postural limitations   Postural Limitations Rounded Shoulders;Forward head;Increased thoracic kyphosis                     [facility] Adult PT Treatment/Exercise - 01/04/17 1532      Neck Exercises: Machines for Strengthening   Other Machines for Strengthening NuStep L5: (arms/legs) x 8 min      Neck Exercises: Standing   Neck Retraction 10 reps   Neck Retraction Limitations against pool noodle     Neck Exercises: Supine   Neck Retraction 10 reps;5 secs   Neck Retraction Limitations with blue noodle   Shoulder Flexion Both;10 reps  overhead pull, red band x 10 reps, 2 sets     Shoulder Exercises: Standing   Horizontal ABduction Both;10 reps;Theraband   Theraband Level (Shoulder Horizontal ABduction) Level 2 (Red)   Horizontal ABduction Limitations with noodle   External Rotation Both;10 reps;Theraband  around noodle   Theraband Level (Shoulder External Rotation) Level 2 (Red)   Retraction Both;10 reps   Retraction Limitations against pool noodle; 5 sec hold     Moist Heat Therapy  Number Minutes Moist Heat 15 Minutes   Moist Heat Location Cervical;Lumbar Spine     Electrical Stimulation   Electrical Stimulation Location bilat post cervical and upper trap    Electrical Stimulation Action IFC   Electrical Stimulation Parameters to tolerance   Electrical Stimulation Goals Pain     Neck Exercises: Stretches   Other Neck Stretches chest stretch x 3 min on blue noodle                     PT Long Term Goals - 01/04/17 1600      PT LONG TERM GOAL #1   Title Improve posture and alignment - with scapulae in better alignment along the thoracic wall and head of the humerus more in anatomical position 02/15/17   Baseline 01/04/17: continues to demonstrate poor posture with rounded  shoulders and forward head   Status On-going     PT LONG TERM GOAL #2   Title Increase AROM bilat shoulders allowing patient to reach overhead with greater ease 02/15/17   Baseline 01/04/17: still with limitation   Status On-going     PT LONG TERM GOAL #3   Title Decrease cervical pain with patient reporting 50% less pain for 50% of the day 01/08/17   Baseline 01/04/17: depends on day and activities   Status Partially Met     PT LONG TERM GOAL #4   Title I in HEP 02/15/17   Status On-going     PT LONG TERM GOAL #5   Title Improve FOTO  to </= 48% limitation 02/15/17   Baseline 01/04/17: 55% limited   Status On-going               Plan - 01/04/17 1602    Clinical Impression Statement Pt with some increase in pain and muscle soreness today after session.  Goals continue to be ongoing with none met at this time.  Minimal progress made towards goals.  Recommend pt follow up with MD as she's had insurance changes which may allow for other interventions sooner than previous insurance.  Otherwise will continue per PT POC at 1x/wk x 6 weeks.   Rehab Potential Fair   PT Frequency 1x / week   PT Duration 6 weeks   PT Treatment/Interventions Patient/family education;Neuromuscular re-education;ADLs/Self Care Home Management;Cryotherapy;Electrical Stimulation;Iontophoresis 61m/ml Dexamethasone;Moist Heat;Ultrasound;Dry needling;Manual techniques;Therapeutic activities;Therapeutic exercise   PT Next Visit Plan postural correction; stretching; progress HEP if prone exercises tolerated.  - Will continue to avoid vigorous stretching for cervical spine due to carotid artery tear; posterior shoulder girdle strengthening; manual work and modalities as indicated    Consulted and Agree with Plan of Care Patient      Patient will benefit from skilled therapeutic intervention in order to improve the following deficits and impairments:  Postural dysfunction, Improper body mechanics, Pain, Decreased range  of motion, Decreased mobility, Increased muscle spasms, Increased fascial restricitons, Decreased activity tolerance  Visit Diagnosis: Cervicalgia - Plan: PT plan of care cert/re-cert  Abnormal posture - Plan: PT plan of care cert/re-cert     Problem List Patient Active Problem List   Diagnosis Date Noted  . Influenza-like illness 11/12/2016  . Cervical lymphadenopathy 10/29/2016  . Nevus 06/18/2016  . Dark stools 10/31/2015  . Essential hypertension, benign 03/07/2015  . Carpal tunnel syndrome, bilateral 11/29/2014  . Carotid artery dissection ([place] 04/26/2014  . Cervical spondylosis without myelopathy 04/13/2014  . Perennial allergic rhinitis 11/16/2013  . Spotting 11/16/2013  . Skin tag 11/16/2013  .  Obsessive compulsive disorder 10/10/2013  . Hypothyroidism 10/10/2013  . Obesity 10/10/2013  . Annual physical exam 10/10/2013  . Lumbar degenerative disc disease 10/10/2013  . Perimenopausal 10/10/2013      Laureen Abrahams, PT, DPT 01/04/17 4:07 PM    [facility] [address] [place], [place], [address] Phone: [phone]   Fax:  [phone]  Name: Kelsey Snow MRN: [id number] Date of Birth: [birth year]

## 2017-01-06 ENCOUNTER — Encounter: Payer: Self-pay | Admitting: Physical Therapy

## 2017-01-08 LAB — TSH: TSH: 0.26 m[IU]/L — ABNORMAL LOW

## 2017-01-11 MED ORDER — LEVOTHYROXINE SODIUM 137 MCG PO TABS: 137.0000 ug | ORAL_TABLET | Freq: Every day | ORAL | 3 refills | Status: DC

## 2017-01-11 NOTE — Addendum Note (Signed)
Addended by: Silverio Decamp on: 01/11/2017 01:25 PM   Modules accepted: Orders

## 2017-01-18 ENCOUNTER — Ambulatory Visit (INDEPENDENT_AMBULATORY_CARE_PROVIDER_SITE_OTHER): Payer: BLUE CROSS/BLUE SHIELD | Admitting: Physical Therapy

## 2017-01-18 DIAGNOSIS — M542 Cervicalgia: Secondary | ICD-10-CM

## 2017-01-18 DIAGNOSIS — R293 Abnormal posture: Secondary | ICD-10-CM

## 2017-01-18 DIAGNOSIS — Z7409 Other reduced mobility: Secondary | ICD-10-CM

## 2017-01-18 DIAGNOSIS — M623 Immobility syndrome (paraplegic): Secondary | ICD-10-CM | POA: Diagnosis not present

## 2017-01-18 DIAGNOSIS — R29898 Other symptoms and signs involving the musculoskeletal system: Secondary | ICD-10-CM

## 2017-01-18 DIAGNOSIS — M2560 Stiffness of unspecified joint, not elsewhere classified: Secondary | ICD-10-CM

## 2017-01-18 NOTE — Therapy (Signed)
[place] [address] [place], [place], [address] Phone: [phone]   Fax:  [phone]  Physical Therapy Treatment  Patient Details  Name: Kelsey Snow MRN: [id number] Date of Birth: 23-Jan-1966 Referring Provider: Dr. [name]  Encounter Date: 01/18/2017      PT End of Session - 01/18/17 1523    Visit Number 9   Number of Visits 14   Date for PT Re-Evaluation 02/15/17   PT Start Time 2641   PT Stop Time 1615   PT Time Calculation (min) 52 min   Activity Tolerance Patient tolerated treatment well      Past Medical History:  Diagnosis Date  . Anemia   . Essential hypertension, benign 03/07/2015  . GERD (gastroesophageal reflux disease)   . Hx of ectopic pregnancy   . Hyperlipidemia   . Lumbosacral spondylolysis   . Obese   . OCD (obsessive compulsive disorder)   . Thyroid disease     Past Surgical History:  Procedure Laterality Date  . BACK SURGERY    . ECTOPIC PREGNANCY SURGERY    . TUBAL LIGATION    . TUBAL LIGATION      There were no vitals filed for this visit.      Subjective Assessment - 01/18/17 1524    Subjective Pt is sore today from sitting so long for Easter . Neck has felt pretty good this week, didn't do a lot of down dog poses.    Pertinent History L5/S1 fusion; multiple injections; fibromuscular dysplasia; connective tissue disorder bilat cervical spine;  Lt carotid artery tear partially healed; bilat carpal tunnel    Patient Stated Goals ease pain and learn what she can do at home    Currently in Pain? Yes   Pain Score 2    Pain Location Neck   Pain Orientation Left;Right   Pain Descriptors / Indicators Dull   Pain Type Chronic pain   Pain Onset More than a month ago   Pain Frequency Constant   Aggravating Factors  holding arms overhead   Pain Relieving Factors heat, medication   Multiple Pain Sites Yes   Pain Score 5   Pain Location Thoracic   Pain Orientation  Right;Left   Pain Descriptors / Indicators Aching;Dull;Tender   Pain Type Chronic pain   Pain Onset More than a month ago   Pain Frequency Intermittent                         [facility] Adult PT Treatment/Exercise - 01/18/17 0001      Exercises   Exercises Neck     Neck Exercises: Machines for Strengthening   UBE (Upper Arm Bike) L2 x 6' alt FWD/BWD     Neck Exercises: Sidelying   Other Sidelying Exercise upper trunk rotation , 10 reps each side. , red banc     Neck Exercises: Prone   Other Prone Exercise on bolster, horizontal abd, overhead pull,  red band, 2x10, followed by upper back stretch on bolsters.   shoulder discomfort eased, hands went numb during these.      Moist Heat Therapy   Number Minutes Moist Heat 15 Minutes   Moist Heat Location Cervical  thoracic     Electrical Stimulation   Electrical Stimulation Location shoulders/thoracic   Electrical Stimulation Action IFC   Electrical Stimulation Parameters to tolerance   Electrical Stimulation Goals Pain  PT Long Term Goals - 01/18/17 1529      PT LONG TERM GOAL #1   Title Improve posture and alignment - with scapulae in better alignment along the thoracic wall and head of the humerus more in anatomical position 02/15/17   Status On-going     PT LONG TERM GOAL #2   Title Increase AROM bilat shoulders allowing patient to reach overhead with greater ease 02/15/17   Status On-going     PT LONG TERM GOAL #3   Title Decrease cervical pain with patient reporting 50% less pain for 50% of the day 01/08/17   Status Partially Met     PT LONG TERM GOAL #4   Title I in HEP 02/15/17   Status On-going     PT LONG TERM GOAL #5   Title Improve FOTO  to </= 48% limitation 02/15/17   Status On-going               Plan - 01/18/17 1602    Clinical Impression Statement Dazia had good control of her neck pain today, the pain was more in her thoracic area.  No goals met,  however she reports she is doing a little more in joga.    Rehab Potential Fair   PT Frequency 1x / week   PT Duration 6 weeks   PT Treatment/Interventions Patient/family education;Neuromuscular re-education;ADLs/Self Care Home Management;Cryotherapy;Electrical Stimulation;Iontophoresis 64m/ml Dexamethasone;Moist Heat;Ultrasound;Dry needling;Manual techniques;Therapeutic activities;Therapeutic exercise   PT Next Visit Plan postural correction; stretching; progress HEP if prone exercises tolerated.  - Will continue to avoid vigorous stretching for cervical spine due to carotid artery tear; posterior shoulder girdle strengthening; manual work and modalities as indicated       Patient will benefit from skilled therapeutic intervention in order to improve the following deficits and impairments:  Postural dysfunction, Improper body mechanics, Pain, Decreased range of motion, Decreased mobility, Increased muscle spasms, Increased fascial restricitons, Decreased activity tolerance  Visit Diagnosis: Cervicalgia  Abnormal posture  Stiffness due to immobility  Other symptoms and signs involving the musculoskeletal system     Problem List Patient Active Problem List   Diagnosis Date Noted  . Influenza-like illness 11/12/2016  . Cervical lymphadenopathy 10/29/2016  . Nevus 06/18/2016  . Dark stools 10/31/2015  . Essential hypertension, benign 03/07/2015  . Carpal tunnel syndrome, bilateral 11/29/2014  . Carotid artery dissection ([place] 04/26/2014  . Cervical spondylosis without myelopathy 04/13/2014  . Perennial allergic rhinitis 11/16/2013  . Spotting 11/16/2013  . Skin tag 11/16/2013  . Obsessive compulsive disorder 10/10/2013  . Hypothyroidism 10/10/2013  . Obesity 10/10/2013  . Annual physical exam 10/10/2013  . Lumbar degenerative disc disease 10/10/2013  . Perimenopausal 10/10/2013    SJeral PinchPT  01/18/2017, 4:04 PM  [facility] [address]Phone: [phone]  Fax:  [phone] Name: TJudie HollickMRN: [id number] of Birth: [birth year]

## 2017-01-20 ENCOUNTER — Encounter: Payer: Self-pay | Admitting: Physical Therapy

## 2017-01-22 ENCOUNTER — Other Ambulatory Visit: Payer: Self-pay

## 2017-01-22 MED ORDER — TRAZODONE HCL 100 MG PO TABS: ORAL_TABLET | ORAL | 0 refills | Status: DC

## 2017-01-26 ENCOUNTER — Other Ambulatory Visit: Payer: Self-pay

## 2017-01-26 MED ORDER — OMEPRAZOLE 40 MG PO CPDR: 40.0000 mg | DELAYED_RELEASE_CAPSULE | Freq: Every day | ORAL | 3 refills | Status: DC

## 2017-01-29 ENCOUNTER — Ambulatory Visit (INDEPENDENT_AMBULATORY_CARE_PROVIDER_SITE_OTHER): Payer: Managed Care, Other (non HMO) | Admitting: Physical Therapy

## 2017-01-29 DIAGNOSIS — R29898 Other symptoms and signs involving the musculoskeletal system: Secondary | ICD-10-CM | POA: Diagnosis not present

## 2017-01-29 DIAGNOSIS — M542 Cervicalgia: Secondary | ICD-10-CM | POA: Diagnosis not present

## 2017-01-29 DIAGNOSIS — Z7409 Other reduced mobility: Secondary | ICD-10-CM

## 2017-01-29 DIAGNOSIS — R293 Abnormal posture: Secondary | ICD-10-CM

## 2017-01-29 DIAGNOSIS — M623 Immobility syndrome (paraplegic): Secondary | ICD-10-CM | POA: Diagnosis not present

## 2017-01-29 DIAGNOSIS — M2560 Stiffness of unspecified joint, not elsewhere classified: Secondary | ICD-10-CM

## 2017-01-29 DIAGNOSIS — M6281 Muscle weakness (generalized): Secondary | ICD-10-CM | POA: Diagnosis not present

## 2017-01-29 NOTE — Therapy (Signed)
[place], [place], [address] Phone: [phone]   Fax:  [phone]  Physical Therapy Treatment  Patient Details  Name: Kelsey Snow MRN: [id number] Date of Birth: [birth year] Referring Provider: Dr. [name]  Encounter Date: 01/29/2017      PT End of Session - 01/29/17 1152    Visit Number 10   Number of Visits 14   Date for PT Re-Evaluation 02/15/17   PT Start Time 1152   PT Stop Time 1245   PT Time Calculation (min) 53 min   Activity Tolerance Patient tolerated treatment well      Past Medical History:  Diagnosis Date  . Anemia   . Essential hypertension, benign 03/07/2015  . GERD (gastroesophageal reflux disease)   . Hx of ectopic pregnancy   . Hyperlipidemia   . Lumbosacral spondylolysis   . Obese   . OCD (obsessive compulsive disorder)   . Thyroid disease     Past Surgical History:  Procedure Laterality Date  . BACK SURGERY    . ECTOPIC PREGNANCY SURGERY    . TUBAL LIGATION    . TUBAL LIGATION      There were no vitals filed for this visit.      Subjective Assessment - 01/29/17 1153    Subjective Pt is sore all over today, she has done some yard work and walking the dog.    Patient Stated Goals avoid ablation/injections   Currently in Pain? Yes   Pain Score 6    Pain Location Neck   Pain Orientation Right;Left   Pain Descriptors / Indicators Aching;Sore   Pain Type Chronic pain   Pain Radiating Towards into mid thoracic                         [facility] Adult PT Treatment/Exercise - 01/29/17 0001      Exercises   Exercises Neck     Neck Exercises: Machines for Strengthening   UBE (Upper Arm Bike) L2 x 6' alt FWD/BWD     Neck Exercises: Standing   Neck Retraction 20 reps  head presses into ball on wall   Other Standing Exercises 3x10 shoulder shrugs with 1#     Moist Heat Therapy   Number Minutes Moist Heat 15 Minutes   Moist Heat  Location Cervical  thoracic     Electrical Stimulation   Electrical Stimulation Location shoulders/thoracic   Electrical Stimulation Action IFC   Electrical Stimulation Parameters  to tolerance   Electrical Stimulation Goals Pain     Neck Exercises: Stretches   Other Neck Stretches doorway stretches with straps, for chest, shoulders and upper back.  2x30 sec each side.                     PT Long Term Goals - 01/18/17 1529      PT LONG TERM GOAL #1   Title Improve posture and alignment - with scapulae in better alignment along the thoracic wall and head of the humerus more in anatomical position 02/15/17   Status On-going     PT LONG TERM GOAL #2   Title Increase AROM bilat shoulders allowing patient to reach overhead with greater ease 02/15/17   Status On-going     PT LONG TERM GOAL #3   Title Decrease cervical pain with patient reporting 50% less pain for 50% of the day 01/08/17   Status Partially Met  PT LONG TERM GOAL #4   Title I in HEP 02/15/17   Status On-going     PT LONG TERM GOAL #5   Title Improve FOTO  to </= 48% limitation 02/15/17   Status On-going               Plan - 01/29/17 1235    Clinical Impression Statement Kelsey Snow is in more pain today than last week as she has tried to do more at home the last couple of days. No goals met, did tolerate the stretching well and loosened her up more.    Rehab Potential Fair   PT Frequency 1x / week   PT Duration 6 weeks   PT Treatment/Interventions Patient/family education;Neuromuscular re-education;ADLs/Self Care Home Management;Cryotherapy;Electrical Stimulation;Iontophoresis 74m/ml Dexamethasone;Moist Heat;Ultrasound;Dry needling;Manual techniques;Therapeutic activities;Therapeutic exercise   PT Next Visit Plan add in more upper back strengthening if she is not in to much pain.    Consulted and Agree with Plan of Care Patient      Patient will benefit from skilled therapeutic intervention in  order to improve the following deficits and impairments:  Postural dysfunction, Improper body mechanics, Pain, Decreased range of motion, Decreased mobility, Increased muscle spasms, Increased fascial restricitons, Decreased activity tolerance  Visit Diagnosis: Cervicalgia  Abnormal posture  Stiffness due to immobility  Other symptoms and signs involving the musculoskeletal system  Muscle weakness (generalized)     Problem List Patient Active Problem List   Diagnosis Date Noted  . Influenza-like illness 11/12/2016  . Cervical lymphadenopathy 10/29/2016  . Nevus 06/18/2016  . Dark stools 10/31/2015  . Essential hypertension, benign 03/07/2015  . Carpal tunnel syndrome, bilateral 11/29/2014  . Carotid artery dissection ([place] 04/26/2014  . Cervical spondylosis without myelopathy 04/13/2014  . Perennial allergic rhinitis 11/16/2013  . Spotting 11/16/2013  . Skin tag 11/16/2013  . Obsessive compulsive disorder 10/10/2013  . Hypothyroidism 10/10/2013  . Obesity 10/10/2013  . Annual physical exam 10/10/2013  . Lumbar degenerative disc disease 10/10/2013  . Perimenopausal 10/10/2013    SJeral PinchPT 01/29/2017, 12:46 PM  [facility] [address]Phone: [phone]  Fax:  [phone] Name: Kelsey BuckholtzMRN: [id number] of Birth: 8July 07, 1967

## 2017-02-03 ENCOUNTER — Ambulatory Visit (INDEPENDENT_AMBULATORY_CARE_PROVIDER_SITE_OTHER): Payer: BLUE CROSS/BLUE SHIELD | Admitting: Physical Therapy

## 2017-02-03 DIAGNOSIS — R29898 Other symptoms and signs involving the musculoskeletal system: Secondary | ICD-10-CM

## 2017-02-03 DIAGNOSIS — R293 Abnormal posture: Secondary | ICD-10-CM

## 2017-02-03 DIAGNOSIS — M6281 Muscle weakness (generalized): Secondary | ICD-10-CM | POA: Diagnosis not present

## 2017-02-03 DIAGNOSIS — M542 Cervicalgia: Secondary | ICD-10-CM | POA: Diagnosis not present

## 2017-02-03 NOTE — Therapy (Signed)
[place], [place], [address] Phone: [phone]   Fax:  [phone]  Physical Therapy Treatment  Patient Details  Name: Kelsey Snow MRN: [id number] Date of Birth: [birth year] Referring Provider: Dr. [name]  Encounter Date: 02/03/2017      PT End of Session - 02/03/17 1553    Visit Number 11   Number of Visits 14   Date for PT Re-Evaluation 02/15/17   PT Start Time 1791   PT Stop Time 1607   PT Time Calculation (min) 44 min   Activity Tolerance Patient tolerated treatment well   Behavior During Therapy [facility] for tasks assessed/performed      Past Medical History:  Diagnosis Date  . Anemia   . Essential hypertension, benign 03/07/2015  . GERD (gastroesophageal reflux disease)   . Hx of ectopic pregnancy   . Hyperlipidemia   . Lumbosacral spondylolysis   . Obese   . OCD (obsessive compulsive disorder)   . Thyroid disease     Past Surgical History:  Procedure Laterality Date  . BACK SURGERY    . ECTOPIC PREGNANCY SURGERY    . TUBAL LIGATION    . TUBAL LIGATION      There were no vitals filed for this visit.      Subjective Assessment - 02/03/17 1525    Subjective neck and shoulders are sore today from doing a lot of yardwork this weekend.   Pertinent History L5/S1 fusion; multiple injections; fibromuscular dysplasia; connective tissue disorder bilat cervical spine;  Lt carotid artery tear partially healed; bilat carpal tunnel    How long can you sit comfortably? 45 min   How long can you stand comfortably? 5-10 min (previously 10-15 min)   How long can you walk comfortably? 30 min   Patient Stated Goals avoid ablation/injections   Currently in Pain? Yes   Pain Score 5    Pain Location Neck   Pain Orientation Right;Left   Pain Descriptors / Indicators Aching;Sore   Pain Type Chronic pain   Pain Onset More than a month ago   Pain Frequency Constant   Aggravating  Factors  holding arms overhead   Pain Relieving Factors heat, medication   Multiple Pain Sites Yes   Pain Score 7   Pain Location Shoulder   Pain Orientation Left   Pain Descriptors / Indicators Stabbing  twinging   Pain Type Chronic pain   Pain Onset More than a month ago   Pain Frequency Intermittent   Aggravating Factors  yardwork                         [facility] Adult PT Treatment/Exercise - 02/03/17 1528      Neck Exercises: Machines for Strengthening   Other Machines for Strengthening NuStep L5: (arms/legs) x 6 min      Neck Exercises: Standing   Neck Retraction 20 reps;5 secs  into noodle   Other Standing Exercises horizontal abduction; scapular rows; bil er with yellow theraband x 20 with pool noodle     Moist Heat Therapy   Number Minutes Moist Heat 15 Minutes   Moist Heat Location Cervical;Shoulder;Lumbar Spine     Electrical Stimulation   Electrical Stimulation Location Lt shoulder/scapula   Electrical Stimulation Action IFC   Electrical Stimulation Parameters to tolerance   Electrical Stimulation Goals Pain     Neck Exercises: Stretches   Upper Trapezius Stretch 2 reps;30 seconds  bil  Levator Stretch 2 reps;30 seconds  bil                     PT Long Term Goals - 01/18/17 1529      PT LONG TERM GOAL #1   Title Improve posture and alignment - with scapulae in better alignment along the thoracic wall and head of the humerus more in anatomical position 02/15/17   Status On-going     PT LONG TERM GOAL #2   Title Increase AROM bilat shoulders allowing patient to reach overhead with greater ease 02/15/17   Status On-going     PT LONG TERM GOAL #3   Title Decrease cervical pain with patient reporting 50% less pain for 50% of the day 01/08/17   Status Partially Met     PT LONG TERM GOAL #4   Title I in HEP 02/15/17   Status On-going     PT LONG TERM GOAL #5   Title Improve FOTO  to </= 48% limitation 02/15/17   Status On-going                Plan - 02/03/17 1554    Clinical Impression Statement Pt tolerated exercises well but continues to have increase in pain with resistance exercises, and reports increased activity can increase pain which takes a couple days to resolve.  Limited progress made towards goals at this time.  States return to outside yardwork flared pain up and remains elevated.   PT Treatment/Interventions Patient/family education;Neuromuscular re-education;ADLs/Self Care Home Management;Cryotherapy;Electrical Stimulation;Iontophoresis 76m/ml Dexamethasone;Moist Heat;Ultrasound;Dry needling;Manual techniques;Therapeutic activities;Therapeutic exercise   PT Next Visit Plan add in more upper back strengthening if she is not in to much pain.    Consulted and Agree with Plan of Care Patient      Patient will benefit from skilled therapeutic intervention in order to improve the following deficits and impairments:  Postural dysfunction, Improper body mechanics, Pain, Decreased range of motion, Decreased mobility, Increased muscle spasms, Increased fascial restricitons, Decreased activity tolerance  Visit Diagnosis: Cervicalgia  Abnormal posture  Other symptoms and signs involving the musculoskeletal system  Muscle weakness (generalized)     Problem List Patient Active Problem List   Diagnosis Date Noted  . Influenza-like illness 11/12/2016  . Cervical lymphadenopathy 10/29/2016  . Nevus 06/18/2016  . Dark stools 10/31/2015  . Essential hypertension, benign 03/07/2015  . Carpal tunnel syndrome, bilateral 11/29/2014  . Carotid artery dissection ([place] 04/26/2014  . Cervical spondylosis without myelopathy 04/13/2014  . Perennial allergic rhinitis 11/16/2013  . Spotting 11/16/2013  . Skin tag 11/16/2013  . Obsessive compulsive disorder 10/10/2013  . Hypothyroidism 10/10/2013  . Obesity 10/10/2013  . Annual physical exam 10/10/2013  . Lumbar degenerative disc disease 10/10/2013  .  Perimenopausal 10/10/2013      SLaureen Abrahams PT, DPT 02/03/17 3:57 PM    [facility] [address]Phone: [phone]  Fax:  [phone] Name: Kelsey HeatheringtonMRN: [id number] of Birth: 810-15-1967

## 2017-02-10 ENCOUNTER — Ambulatory Visit (INDEPENDENT_AMBULATORY_CARE_PROVIDER_SITE_OTHER): Payer: Managed Care, Other (non HMO) | Admitting: Physical Therapy

## 2017-02-10 DIAGNOSIS — R29898 Other symptoms and signs involving the musculoskeletal system: Secondary | ICD-10-CM | POA: Diagnosis not present

## 2017-02-10 DIAGNOSIS — R293 Abnormal posture: Secondary | ICD-10-CM | POA: Diagnosis not present

## 2017-02-10 DIAGNOSIS — M6281 Muscle weakness (generalized): Secondary | ICD-10-CM

## 2017-02-10 DIAGNOSIS — M542 Cervicalgia: Secondary | ICD-10-CM

## 2017-02-10 NOTE — Therapy (Signed)
[facility] [address] [place], [place], [address] Phone: [phone]   Fax:  [phone]  Physical Therapy Treatment  Patient Details  Name: Kelsey Snow MRN: [id number] Date of Birth: 10/28/1965 Referring Provider: Dr. Thekkekandam  Encounter Date: 02/10/2017      PT End of Session - 02/10/17 1557    Visit Number 12   Number of Visits 14   Date for PT Re-Evaluation 02/15/17   PT Start Time 1520   PT Stop Time 1610   PT Time Calculation (min) 50 min   Activity Tolerance Patient tolerated treatment well   Behavior During Therapy WFL for tasks assessed/performed      Past Medical History:  Diagnosis Date  . Anemia   . Essential hypertension, benign 03/07/2015  . GERD (gastroesophageal reflux disease)   . Hx of ectopic pregnancy   . Hyperlipidemia   . Lumbosacral spondylolysis   . Obese   . OCD (obsessive compulsive disorder)   . Thyroid disease     Past Surgical History:  Procedure Laterality Date  . BACK SURGERY    . ECTOPIC PREGNANCY SURGERY    . TUBAL LIGATION    . TUBAL LIGATION      There were no vitals filed for this visit.      Subjective Assessment - 02/10/17 1523    Subjective sore for 2 days after last session, but now back to baseline.   Pertinent History L5/S1 fusion; multiple injections; fibromuscular dysplasia; connective tissue disorder bilat cervical spine;  Lt carotid artery tear partially healed; bilat carpal tunnel    Patient Stated Goals avoid ablation/injections   Currently in Pain? Yes   Pain Score 3    Pain Location Neck  shoulders   Pain Orientation Right;Left;Mid   Pain Descriptors / Indicators Dull;Throbbing   Pain Type Chronic pain   Pain Onset More than a month ago   Pain Frequency Constant   Aggravating Factors  holding arms overhead   Pain Relieving Factors heat, medication                         [facility] Adult PT Treatment/Exercise -  02/10/17 1524      Neck Exercises: Machines for Strengthening   Other Machines for Strengthening NuStep L5: (arms/legs) x 6 min      Neck Exercises: Standing   Wall Push Ups 10 reps   Other Standing Exercises shoulder shrug with scapular retraction x10 reps; 2#; middle delt flys 1# x 10;      Neck Exercises: Supine   Neck Retraction 10 reps;5 secs   Shoulder Flexion Both;10 reps  overhead pull with green theraband   Shoulder ABduction Both;10 reps  horizontal abduction; red theraband   Other Supine Exercise bil external rotation x10 green theraband     Neck Exercises: Sidelying   Other Sidelying Exercise upper trunk rotation 10 sec hold x 10 sec each direction     Neck Exercises: Prone   W Back 10 reps  3 sec hold   Other Prone Exercise horizontal abduction ("T") x 10 bil     Moist Heat Therapy   Number Minutes Moist Heat 15 Minutes   Moist Heat Location Cervical;Shoulder;Lumbar Spine     Electrical Stimulation   Electrical Stimulation Location bil upper thoracic   Electrical Stimulation Action premod   Electrical Stimulation Parameters to tolerance   Electrical Stimulation Goals Pain                  PT Education - 02/10/17 1557    Education provided No             PT Long Term Goals - 01/18/17 1529      PT LONG TERM GOAL #1   Title Improve posture and alignment - with scapulae in better alignment along the thoracic wall and head of the humerus more in anatomical position 02/15/17   Status On-going     PT LONG TERM GOAL #2   Title Increase AROM bilat shoulders allowing patient to reach overhead with greater ease 02/15/17   Status On-going     PT LONG TERM GOAL #3   Title Decrease cervical pain with patient reporting 50% less pain for 50% of the day 01/08/17   Status Partially Met     PT LONG TERM GOAL #4   Title I in HEP 02/15/17   Status On-going     PT LONG TERM GOAL #5   Title Improve FOTO  to </= 48% limitation 02/15/17   Status On-going                Plan - 02/10/17 1557    Clinical Impression Statement Pt without increase in pain today and tolerated all strengthening exercises well today, but reports she hasn't been as active at home so pain is well controlled.  Pt to see MD 02/19/17 so will see one more visit to assess goals and then update MD on current progress.     PT Treatment/Interventions Patient/family education;Neuromuscular re-education;ADLs/Self Care Home Management;Cryotherapy;Electrical Stimulation;Iontophoresis 25m/ml Dexamethasone;Moist Heat;Ultrasound;Dry needling;Manual techniques;Therapeutic activities;Therapeutic exercise   PT Next Visit Plan assess goals, needs MD note; recert v. d/c   Consulted and Agree with Plan of Care Patient      Patient will benefit from skilled therapeutic intervention in order to improve the following deficits and impairments:  Postural dysfunction, Improper body mechanics, Pain, Decreased range of motion, Decreased mobility, Increased muscle spasms, Increased fascial restricitons, Decreased activity tolerance  Visit Diagnosis: Cervicalgia  Abnormal posture  Other symptoms and signs involving the musculoskeletal system  Muscle weakness (generalized)     Problem List Patient Active Problem List   Diagnosis Date Noted  . Influenza-like illness 11/12/2016  . Cervical lymphadenopathy 10/29/2016  . Nevus 06/18/2016  . Dark stools 10/31/2015  . Essential hypertension, benign 03/07/2015  . Carpal tunnel syndrome, bilateral 11/29/2014  . Carotid artery dissection ([place] 04/26/2014  . Cervical spondylosis without myelopathy 04/13/2014  . Perennial allergic rhinitis 11/16/2013  . Spotting 11/16/2013  . Skin tag 11/16/2013  . Obsessive compulsive disorder 10/10/2013  . Hypothyroidism 10/10/2013  . Obesity 10/10/2013  . Annual physical exam 10/10/2013  . Lumbar degenerative disc disease 10/10/2013  . Perimenopausal 10/10/2013       SLaureen Abrahams PT,  DPT 02/10/17 4:00 PM    [facility] [address]Phone: [phone]  Fax:  [phone] Name: TReighn KaplanMRN: [id number] of Birth: 8December 06, 1967

## 2017-02-15 ENCOUNTER — Encounter: Payer: Self-pay | Admitting: Physical Therapy

## 2017-02-15 ENCOUNTER — Ambulatory Visit (INDEPENDENT_AMBULATORY_CARE_PROVIDER_SITE_OTHER): Payer: Managed Care, Other (non HMO) | Admitting: Physical Therapy

## 2017-02-15 DIAGNOSIS — M542 Cervicalgia: Secondary | ICD-10-CM

## 2017-02-15 DIAGNOSIS — R293 Abnormal posture: Secondary | ICD-10-CM | POA: Diagnosis not present

## 2017-02-15 DIAGNOSIS — R29898 Other symptoms and signs involving the musculoskeletal system: Secondary | ICD-10-CM | POA: Diagnosis not present

## 2017-02-15 DIAGNOSIS — M6281 Muscle weakness (generalized): Secondary | ICD-10-CM | POA: Diagnosis not present

## 2017-02-15 NOTE — Patient Instructions (Signed)

## 2017-02-15 NOTE — Therapy (Signed)
[facility] [address] [place], [place], [address] Phone: [phone]   Fax:  [phone]  Physical Therapy Treatment  Patient Details  Name: Kelsey Snow MRN: [id number] Date of Birth: 1966-01-21 Referring Provider: Dr [name]  Encounter Date: 02/15/2017      PT End of Session - 02/15/17 1518    Visit Number 13   Number of Visits 14   Date for PT Re-Evaluation 02/15/17   PT Start Time 1519   PT Stop Time 1627   PT Time Calculation (min) 68 min   Activity Tolerance Patient tolerated treatment well      Past Medical History:  Diagnosis Date  . Anemia   . Essential hypertension, benign 03/07/2015  . GERD (gastroesophageal reflux disease)   . Hx of ectopic pregnancy   . Hyperlipidemia   . Lumbosacral spondylolysis   . Obese   . OCD (obsessive compulsive disorder)   . Thyroid disease     Past Surgical History:  Procedure Laterality Date  . BACK SURGERY    . ECTOPIC PREGNANCY SURGERY    . TUBAL LIGATION    . TUBAL LIGATION      There were no vitals filed for this visit.      Subjective Assessment - 02/15/17 1522    Subjective Kima is sore in her arm.  She is going to see the neurosurgeon on Friday and asked [place] to forward our information on to him. She also sees Dr T this week. Bilat hand numbness   Currently in Pain? Yes   Pain Score 5    Pain Location Neck   Pain Orientation Left   Pain Descriptors / Indicators Dull;Aching   Pain Type Chronic pain   Pain Radiating Towards down into the shoulder blades   Pain Onset More than a month ago   Pain Frequency Constant   Aggravating Factors  holding arms overhead. all housekeeping activities, lifting and certain postures.    Pain Relieving Factors heat and medication, patches            [facility] PT Assessment - 02/15/17 0001      Assessment   Medical Diagnosis Cervical DDD   Referring Provider Dr [name]   Onset Date/Surgical Date  03/19/16   Hand Dominance Right     Observation/Other Assessments   Focus on Therapeutic Outcomes (FOTO)  58% limited     Posture/Postural Control   Posture/Postural Control Postural limitations   Postural Limitations Rounded Shoulders;Forward head     ROM / Strength   AROM / PROM / Strength AROM;Strength     AROM   AROM Assessment Site Shoulder;Cervical   Right/Left Shoulder Right;Left   Right Shoulder Flexion 164 Degrees   Right Shoulder Internal Rotation 67 Degrees   Right Shoulder External Rotation 88 Degrees   Left Shoulder Flexion 142 Degrees  with pain   Left Shoulder Internal Rotation 85 Degrees  with pain   Left Shoulder External Rotation 50 Degrees  with pain   Cervical Flexion 30 with pain   Cervical Extension 32    Cervical - Right Rotation 68 with pain    Cervical - Left Rotation 48 with pain     Strength   Strength Assessment Site Shoulder   Right/Left Shoulder --  Rt WNL, Lt flex/abduct/ER 4/5 with pain, IR 5/5                     [facility] Adult PT Treatment/Exercise - 02/15/17 0001  Neck Exercises: Machines for Strengthening   UBE (Upper Arm Bike) L2 x 6' alt FWD/BWD     Neck Exercises: Prone   Neck Retraction 10 reps  VC for form   W Back 15 reps  with overhead reach   Other Prone Exercise T's 15 reps palms down and thumbs up.    Other Prone Exercise 2x10 scapular retractions arms off EOB     Modalities   Modalities Electrical Stimulation;Moist Heat     Moist Heat Therapy   Number Minutes Moist Heat 15 Minutes   Moist Heat Location Cervical;Shoulder;Lumbar Spine     Electrical Stimulation   Electrical Stimulation Location bil upper thoracic   Electrical Stimulation Action IFC   Electrical Stimulation Parameters  to tolerance   Electrical Stimulation Goals Pain     Neck Exercises: Stretches   Other Neck Stretches s/l chest openers moving arm through motion                     PT Long Term Goals - 02/15/17  1528      PT LONG TERM GOAL #1   Title Improve posture and alignment - with scapulae in better alignment along the thoracic wall and head of the humerus more in anatomical position 02/15/17   Status Not Met     PT LONG TERM GOAL #2   Title Increase AROM bilat shoulders allowing patient to reach overhead with greater ease 02/15/17   Status Not Met     PT LONG TERM GOAL #3   Title Decrease cervical pain with patient reporting 50% less pain for 50% of the day 01/08/17   Status Not Met  reports grossly 40% improved     PT LONG TERM GOAL #4   Title I in HEP 02/15/17   Status Achieved  doing her stretches, HEP and her exercise classes     PT LONG TERM GOAL #5   Title Improve FOTO  to </= 48% limitation 02/15/17   Status Not Met  58% limited               Plan - 02/15/17 1544    Clinical Impression Statement Sameen has been finished this plan of care.  She was interested in trying DN however she has to get up early tomorrow and is afraid if she is sore after it she won't be able to function.  She has partially met her goals.  Otherwise she has reached a plateau and not making progress. She has decreased ROM  and strength in her neck and shoulders with pain . She is seeing her referring MD and a neuro surgeon this week for further advice on how to proceed.  If trigger point dry needling is recommended we can try it.     Rehab Potential Fair   PT Frequency 1x / week   PT Duration 6 weeks   PT Treatment/Interventions Patient/family education;Neuromuscular re-education;ADLs/Self Care Home Management;Cryotherapy;Electrical Stimulation;Iontophoresis 86m/ml Dexamethasone;Moist Heat;Ultrasound;Dry needling;Manual techniques;Therapeutic activities;Therapeutic exercise   PT Next Visit Plan patient will be either discharged due to lack of progress or if dry needling is adviced we will try that.    Consulted and Agree with Plan of Care Patient      Patient will benefit from skilled therapeutic  intervention in order to improve the following deficits and impairments:  Postural dysfunction, Improper body mechanics, Pain, Decreased range of motion, Decreased mobility, Increased muscle spasms, Increased fascial restricitons, Decreased activity tolerance  Visit Diagnosis: Cervicalgia  Abnormal posture  Other symptoms and signs involving the musculoskeletal system  Muscle weakness (generalized)     Problem List Patient Active Problem List   Diagnosis Date Noted  . Influenza-like illness 11/12/2016  . Cervical lymphadenopathy 10/29/2016  . Nevus 06/18/2016  . Dark stools 10/31/2015  . Essential hypertension, benign 03/07/2015  . Carpal tunnel syndrome, bilateral 11/29/2014  . Carotid artery dissection ([place]) 04/26/2014  . Cervical spondylosis without myelopathy 04/13/2014  . Perennial allergic rhinitis 11/16/2013  . Spotting 11/16/2013  . Skin tag 11/16/2013  . Obsessive compulsive disorder 10/10/2013  . Hypothyroidism 10/10/2013  . Obesity 10/10/2013  . Annual physical exam 10/10/2013  . Lumbar degenerative disc disease 10/10/2013  . Perimenopausal 10/10/2013    Jeral Pinch PT  02/15/2017, 4:55 PM  [facility] [place], [place], [address] Phone: [phone]   Fax:  [phone]  Name: Cacie Gaskins MRN: [id number] Date of Birth: [birth year]

## 2017-02-18 ENCOUNTER — Ambulatory Visit (INDEPENDENT_AMBULATORY_CARE_PROVIDER_SITE_OTHER): Payer: Managed Care, Other (non HMO) | Admitting: Sports Medicine

## 2017-02-18 ENCOUNTER — Encounter: Payer: Self-pay | Admitting: Sports Medicine

## 2017-02-18 ENCOUNTER — Encounter: Payer: Self-pay | Admitting: Physical Therapy

## 2017-02-18 DIAGNOSIS — G5603 Carpal tunnel syndrome, bilateral upper limbs: Secondary | ICD-10-CM | POA: Diagnosis not present

## 2017-02-18 NOTE — Progress Notes (Signed)
  Subjective:    CC: Carpal tunnel syndrome  HPI: This is a pleasant 51 year old female, she returns, previous injection/hydrodissection of both median nerves in the carpal tunnel was done about 4 months ago, now having recurrence of numbness, pain, tingling, severe, worsening, desires repeat injection today.  Past medical history:  Negative.  See flowsheet/record as well for more information.  Surgical history: Negative.  See flowsheet/record as well for more information.  Family history: Negative.  See flowsheet/record as well for more information.  Social history: Negative.  See flowsheet/record as well for more information.  Allergies, and medications have been entered into the medical record, reviewed, and no changes needed.   Review of Systems: No fevers, chills, night sweats, weight loss, chest pain, or shortness of breath.   Objective:    General: Well Developed, well nourished, and in no acute distress.  Neuro: Alert and oriented x3, extra-ocular muscles intact, sensation grossly intact.  HEENT: Normocephalic, atraumatic, pupils equal round reactive to light, neck supple, no masses, no lymphadenopathy, thyroid nonpalpable.  Skin: Warm and dry, no rashes. Cardiac: Regular rate and rhythm, no murmurs rubs or gallops, no lower extremity edema.  Respiratory: Clear to auscultation bilaterally. Not using accessory muscles, speaking in full sentences.  Procedure: Real-time Ultrasound Guided Injection of left median nerve hydrodissection Device: GE Logiq E  Verbal informed consent obtained.  Time-out conducted.  Noted no overlying erythema, induration, or other signs of local infection.  Skin prepped in a sterile fashion.  Local anesthesia: Topical Ethyl chloride.  With sterile technique and under real time ultrasound guidance:  Using a 25-gauge needle advanced into the carpal tunnel and taking care to avoid intra-neural injection and I placed medication both superficial to and deep  to the median nerve in the carpal tunnel, I then redirected the needle deep and placed some more medication around the flexor tendons for a total of 1 mL kenalog 40, 5 mL lidocaine. Completed without difficulty  Pain immediately resolved suggesting accurate placement of the medication.  Advised to call if fevers/chills, erythema, induration, drainage, or persistent bleeding.  Images permanently stored and available for review in the ultrasound unit.  Impression: Technically successful ultrasound guided injection.  Procedure: Real-time Ultrasound Guided Injection of right median nerve hydrodissection Device: GE Logiq E  Verbal informed consent obtained.  Time-out conducted.  Noted no overlying erythema, induration, or other signs of local infection.  Skin prepped in a sterile fashion.  Local anesthesia: Topical Ethyl chloride.  With sterile technique and under real time ultrasound guidance:  Using a 25-gauge needle advanced into the carpal tunnel and taking care to avoid intra-neural injection and I placed medication both superficial to and deep to the median nerve in the carpal tunnel, I then redirected the needle deep and placed some more medication around the flexor tendons for a total of 1 mL kenalog 40, 5 mL lidocaine. Completed without difficulty  Pain immediately resolved suggesting accurate placement of the medication.  Advised to call if fevers/chills, erythema, induration, drainage, or persistent bleeding.  Images permanently stored and available for review in the ultrasound unit.  Impression: Technically successful ultrasound guided injection.  Impression and Recommendations:    Carpal tunnel syndrome, bilateral Repeat bilateral median nerve hydrodissection, previous procedure was performed 4 months ago.

## 2017-02-18 NOTE — Assessment & Plan Note (Signed)
Repeat bilateral median nerve hydrodissection, previous procedure was performed 4 months ago.

## 2017-03-03 ENCOUNTER — Encounter: Payer: Self-pay | Admitting: Sports Medicine

## 2017-03-03 ENCOUNTER — Ambulatory Visit (INDEPENDENT_AMBULATORY_CARE_PROVIDER_SITE_OTHER): Payer: Managed Care, Other (non HMO) | Admitting: Sports Medicine

## 2017-03-03 DIAGNOSIS — J3089 Other allergic rhinitis: Secondary | ICD-10-CM | POA: Diagnosis not present

## 2017-03-03 DIAGNOSIS — E039 Hypothyroidism, unspecified: Secondary | ICD-10-CM | POA: Diagnosis not present

## 2017-03-03 LAB — TSH: TSH: 2.88 m[IU]/L

## 2017-03-03 LAB — T4, FREE: Free T4: 1.3 ng/dL (ref 0.8–1.8)

## 2017-03-03 LAB — T3, FREE: T3, Free: 2.4 pg/mL (ref 2.3–4.2)

## 2017-03-03 MED ORDER — FEXOFENADINE HCL 180 MG PO TABS: 180.0000 mg | ORAL_TABLET | Freq: Every day | ORAL | 3 refills | Status: DC

## 2017-03-03 MED ORDER — MONTELUKAST SODIUM 10 MG PO TABS: 10.0000 mg | ORAL_TABLET | Freq: Every day | ORAL | 3 refills | Status: DC

## 2017-03-03 NOTE — Assessment & Plan Note (Signed)
Continue Flonase, switching from Zyrtec to Allegra, adding Singulair. Return if no better in a week or 2.

## 2017-03-03 NOTE — Progress Notes (Signed)
  Subjective:    CC: Upper respiratory symptoms  HPI: For a month now this 51 year old female has had runny nose, mild cough, mild sore throat, really doesn't have any sinus pressure. She is only using Zyrtec and Flonase at this time. No fevers, chills, night sweats, no weight loss, no shortness of breath.  Past medical history:  Negative.  See flowsheet/record as well for more information.  Surgical history: Negative.  See flowsheet/record as well for more information.  Family history: Negative.  See flowsheet/record as well for more information.  Social history: Negative.  See flowsheet/record as well for more information.  Allergies, and medications have been entered into the medical record, reviewed, and no changes needed.   Review of Systems: No fevers, chills, night sweats, weight loss, chest pain, or shortness of breath.   Objective:    General: Well Developed, well nourished, and in no acute distress.  Neuro: Alert and oriented x3, extra-ocular muscles intact, sensation grossly intact.  HEENT: Normocephalic, atraumatic, pupils equal round reactive to light, neck supple, no masses, no lymphadenopathy, thyroid nonpalpable. Oropharynx, nasopharynx, ear canals unremarkable Skin: Warm and dry, no rashes. Cardiac: Regular rate and rhythm, no murmurs rubs or gallops, no lower extremity edema.  Respiratory: Clear to auscultation bilaterally. Not using accessory muscles, speaking in full sentences.  Impression and Recommendations:    Perennial allergic rhinitis Continue Flonase, switching from Zyrtec to Allegra, adding Singulair. Return if no better in a week or 2.  I spent 25 minutes with this patient, greater than 50% was face-to-face time counseling regarding the above diagnoses

## 2017-03-05 ENCOUNTER — Other Ambulatory Visit: Payer: Self-pay | Admitting: Sports Medicine

## 2017-03-05 ENCOUNTER — Encounter: Payer: Self-pay | Admitting: Sports Medicine

## 2017-03-17 ENCOUNTER — Ambulatory Visit (INDEPENDENT_AMBULATORY_CARE_PROVIDER_SITE_OTHER): Payer: Managed Care, Other (non HMO) | Admitting: Physical Therapy

## 2017-03-17 DIAGNOSIS — R293 Abnormal posture: Secondary | ICD-10-CM

## 2017-03-17 DIAGNOSIS — G8929 Other chronic pain: Secondary | ICD-10-CM | POA: Diagnosis not present

## 2017-03-17 DIAGNOSIS — M6281 Muscle weakness (generalized): Secondary | ICD-10-CM

## 2017-03-17 DIAGNOSIS — M542 Cervicalgia: Secondary | ICD-10-CM | POA: Diagnosis not present

## 2017-03-17 DIAGNOSIS — R29898 Other symptoms and signs involving the musculoskeletal system: Secondary | ICD-10-CM | POA: Diagnosis not present

## 2017-03-17 DIAGNOSIS — M545 Low back pain: Secondary | ICD-10-CM

## 2017-03-17 NOTE — Therapy (Signed)
[place]  [place], [place], [address] Phone: [phone]   Fax:  [phone]  Physical Therapy Treatment  Patient Details  Name: Kelsey Snow MRN: [id number] Date of Birth: July 12, 1966 Referring Provider: Dr Park Breed  Encounter Date: 03/17/2017      PT End of Session - 03/17/17 1510    Visit Number 14   Number of Visits 21   Date for PT Re-Evaluation 04/14/17   PT Start Time 1510   PT Stop Time 1610   PT Time Calculation (min) 60 min   Activity Tolerance Patient tolerated treatment well      Past Medical History:  Diagnosis Date  . Anemia   . Essential hypertension, benign 03/07/2015  . GERD (gastroesophageal reflux disease)   . Hx of ectopic pregnancy   . Hyperlipidemia   . Lumbosacral spondylolysis   . Obese   . OCD (obsessive compulsive disorder)   . Thyroid disease     Past Surgical History:  Procedure Laterality Date  . BACK SURGERY    . ECTOPIC PREGNANCY SURGERY    . TUBAL LIGATION    . TUBAL LIGATION      There were no vitals filed for this visit.      Subjective Assessment - 03/17/17 1512    Subjective Pt reports she had returned to the MD, full dx of chronic pain, and he wants her to try dry needling. No other changes in her medical h/o or medication. They are not doing the ablations any more.    Diagnostic tests see chart    Patient Stated Goals get some pain relief.    Currently in Pain? Yes   Pain Score 8    Pain Location Back  and shoulder neck bilat sides.    Pain Orientation Left;Right   Pain Descriptors / Indicators Sharp;Aching   Pain Type Chronic pain   Pain Onset More than a month ago   Pain Frequency Constant   Aggravating Factors  over doing household activities   Pain Relieving Factors heat medication and patches.             [facility] PT Assessment - 03/17/17 0001      Assessment   Medical Diagnosis myfascial pain syndrome/cervical DDD    Referring Provider Dr Park Breed   Onset Date/Surgical Date 03/19/16   Hand Dominance Right     Observation/Other Assessments   Focus on Therapeutic Outcomes (FOTO)  60% limited     AROM   AROM Assessment Site Shoulder;Cervical;Lumbar   Right Shoulder Flexion 150 Degrees  with pain   Right Shoulder Internal Rotation 85 Degrees   Right Shoulder External Rotation 88 Degrees   Left Shoulder Extension 44 Degrees  with pain   Left Shoulder Flexion 160 Degrees   Left Shoulder Internal Rotation 45 Degrees   Left Shoulder External Rotation 85 Degrees   Cervical Flexion 56   Cervical Extension 40   Cervical - Right Rotation 71   Cervical - Left Rotation 52   Lumbar Flexion to floor   Lumbar Extension about 10 degrees from neutral    Lumbar - Right Rotation 25% present    Lumbar - Left Rotation 25% present      Strength   Strength Assessment Site Shoulder  bilat [facility]                     [facility] Adult PT Treatment/Exercise - 03/17/17 0001      Modalities  Modalities Electrical Stimulation;Moist Heat     Moist Heat Therapy   Number Minutes Moist Heat 15 Minutes   Moist Heat Location Lumbar Spine  buttocks     Electrical Stimulation   Electrical Stimulation Location bilat buttocks/lumbar   Electrical Stimulation Action IFC    Electrical Stimulation Parameters to tolerance   Electrical Stimulation Goals Tone;Pain     Manual Therapy   Manual Therapy Soft tissue mobilization   Soft tissue mobilization manual STW to bilat buttocks and lumbar paraspinals with TPR          Trigger Point Dry Needling - 03/17/17 1547    Consent Given? Yes   Education Handout Provided Yes   Muscles Treated Upper Body Longissimus   Muscles Treated Lower Body Gluteus maximus;Gluteus minimus;Piriformis  bilat   Longissimus Response --  bilat lumbar with stim   Gluteus Maximus Response Palpable increased muscle length;Twitch response elicited   Gluteus Minimus Response Twitch  response elicited;Palpable increased muscle length   Piriformis Response Palpable increased muscle length;Twitch response elicited              PT Education - 03/17/17 1549    Education provided Yes   Education Details DN   Person(s) Educated Patient   Methods Explanation;Handout   Comprehension Verbalized understanding             PT Long Term Goals - 03/17/17 1508      PT LONG TERM GOAL #1   Title Improve posture and alignment - with scapulae in better alignment along the thoracic wall and head of the humerus more in anatomical position 04/14/17   Time 4   Status On-going     PT LONG TERM GOAL #2   Title Increase AROM bilat shoulders allowing patient to reach overhead with greater ease 04/14/17   Time 4   Period Weeks   Status On-going     PT LONG TERM GOAL #3   Title Decrease cervical pain with patient reporting 50% less pain for 50% of the day 04/14/17   Time 4   Period Weeks   Status On-going     PT LONG TERM GOAL #4   Title decrease pain in low back =/> 50% to allow her to return to exercise classes 4 days a week ( 04/14/17)    Time 4   Period Weeks   Status New     PT LONG TERM GOAL #5   Title Improve FOTO  to </= 48% limitation 04/14/17   Time 4   Period Weeks   Status On-going  currently 60% limited               Plan - 03/17/17 1551    Clinical Impression Statement Kelsey Snow has returned to PT per MD recommendation for her whole back and shoulders.  We are going to try trigger point dry needling to get the muscles to let go and relax. She has some limited motion in her neck and low back and shoulders.    Rehab Potential Fair   PT Frequency 2x / week   PT Duration 4 weeks   PT Treatment/Interventions Patient/family education;Neuromuscular re-education;ADLs/Self Care Home Management;Cryotherapy;Electrical Stimulation;Iontophoresis 4mg /ml Dexamethasone;Moist Heat;Ultrasound;Dry needling;Manual techniques;Therapeutic activities;Therapeutic exercise    PT Next Visit Plan assess response to DN and continue in low back/buttocks into neck and shoulders.    Consulted and Agree with Plan of Care Patient      Patient will benefit from skilled therapeutic intervention in order to improve the following deficits  and impairments:  Postural dysfunction, Improper body mechanics, Pain, Decreased range of motion, Decreased mobility, Increased muscle spasms, Increased fascial restricitons, Decreased activity tolerance  Visit Diagnosis: Cervicalgia - Plan: PT plan of care cert/re-cert  Abnormal posture - Plan: PT plan of care cert/re-cert  Other symptoms and signs involving the musculoskeletal system - Plan: PT plan of care cert/re-cert  Muscle weakness (generalized) - Plan: PT plan of care cert/re-cert  Chronic bilateral low back pain without sciatica - Plan: PT plan of care cert/re-cert     Problem List Patient Active Problem List   Diagnosis Date Noted  . Influenza-like illness 11/12/2016  . Cervical lymphadenopathy 10/29/2016  . Nevus 06/18/2016  . Dark stools 10/31/2015  . Essential hypertension, benign 03/07/2015  . Carpal tunnel syndrome, bilateral 11/29/2014  . Carotid artery dissection ([place]) 04/26/2014  . Cervical spondylosis without myelopathy 04/13/2014  . Perennial allergic rhinitis 11/16/2013  . Spotting 11/16/2013  . Skin tag 11/16/2013  . Obsessive compulsive disorder 10/10/2013  . Hypothyroidism 10/10/2013  . Obesity 10/10/2013  . Annual physical exam 10/10/2013  . Lumbar degenerative disc disease 10/10/2013  . Perimenopausal 10/10/2013    Kelsey Snow PT  03/17/2017, 3:55 PM  [facility] [place], [place], [address] Phone: [phone]   Fax:  [phone]  Name: Kelsey Snow MRN: [id number] Date of Birth: 1966-04-05

## 2017-03-17 NOTE — Patient Instructions (Signed)

## 2017-03-23 ENCOUNTER — Ambulatory Visit (INDEPENDENT_AMBULATORY_CARE_PROVIDER_SITE_OTHER): Payer: Managed Care, Other (non HMO) | Admitting: Rehabilitative and Restorative Service Providers"

## 2017-03-23 ENCOUNTER — Encounter: Payer: Self-pay | Admitting: Rehabilitative and Restorative Service Providers"

## 2017-03-23 DIAGNOSIS — R293 Abnormal posture: Secondary | ICD-10-CM | POA: Diagnosis not present

## 2017-03-23 DIAGNOSIS — M542 Cervicalgia: Secondary | ICD-10-CM

## 2017-03-23 DIAGNOSIS — R29898 Other symptoms and signs involving the musculoskeletal system: Secondary | ICD-10-CM

## 2017-03-23 NOTE — Therapy (Signed)
[place], [place], [address] Phone: [phone]   Fax:  [phone]  Physical Therapy Treatment  Patient Details  Name: Kelsey Snow MRN: [id number] Date of Birth: 02-Nov-1965 Referring Provider: Dr Park Breed  Encounter Date: 03/23/2017      PT End of Session - 03/23/17 0803    Visit Number 15   Number of Visits 21   Date for PT Re-Evaluation 04/14/17   PT Start Time 0803   PT Stop Time 0856   PT Time Calculation (min) 53 min   Activity Tolerance Patient tolerated treatment well      Past Medical History:  Diagnosis Date  . Anemia   . Essential hypertension, benign 03/07/2015  . GERD (gastroesophageal reflux disease)   . Hx of ectopic pregnancy   . Hyperlipidemia   . Lumbosacral spondylolysis   . Obese   . OCD (obsessive compulsive disorder)   . Thyroid disease     Past Surgical History:  Procedure Laterality Date  . BACK SURGERY    . ECTOPIC PREGNANCY SURGERY    . TUBAL LIGATION    . TUBAL LIGATION      There were no vitals filed for this visit.      Subjective Assessment - 03/23/17 0806    Subjective Linnet reports that the DN did well - she did not like it but it seemed to help afterward. Noted that she did not have the burning in the Lt leg she had been having. She did not have as much burning in the leg with showering.    Currently in Pain? Yes   Pain Score 8    Pain Location Back   Pain Orientation Left;Right;Lower   Pain Descriptors / Indicators Sharp;Aching   Pain Type Chronic pain   Pain Onset More than a month ago   Pain Score 7   Pain Location Shoulder   Pain Orientation Left   Pain Descriptors / Indicators Stabbing   Pain Type Chronic pain   Pain Onset More than a month ago                         [facility] Adult PT Treatment/Exercise - 03/23/17 0001      Moist Heat Therapy   Number Minutes Moist Heat 15 Minutes   Moist Heat  Location Lumbar Spine  buttocks     Electrical Stimulation   Electrical Stimulation Location bilat buttocks/lumbar   Electrical Stimulation Action IFC   Electrical Stimulation Parameters to tolerance   Electrical Stimulation Goals Tone;Pain     Manual Therapy   Manual Therapy Soft tissue mobilization   Soft tissue mobilization manual work through bilat lumbar paraspinals into buttocks with trigger point work through the [employer] Needling - 03/23/17 0825    Consent Given? Yes   Muscles Treated Upper Body Longissimus   Muscles Treated Lower Body Gluteus maximus;Gluteus minimus;Piriformis   Longissimus Response Palpable increased muscle length   Gluteus Maximus Response Palpable increased muscle length   Gluteus Minimus Response Palpable increased muscle length   Piriformis Response Palpable increased muscle length                   PT Long Term Goals - 03/23/17 7510      PT LONG TERM GOAL #1   Title Improve posture and alignment - with scapulae in better alignment along  the thoracic wall and head of the humerus more in anatomical position 04/14/17   Baseline -   Time 4   Period Weeks   Status On-going     PT LONG TERM GOAL #2   Title Increase AROM bilat shoulders allowing patient to reach overhead with greater ease 04/14/17   Baseline -   Time 4   Period Weeks   Status On-going     PT LONG TERM GOAL #3   Title Decrease cervical pain with patient reporting 50% less pain for 50% of the day 04/14/17   Baseline -   Time 4   Period Weeks   Status On-going     PT LONG TERM GOAL #4   Title decrease pain in low back =/> 50% to allow her to return to exercise classes 4 days a week ( 04/14/17)    Time 4   Period Weeks   Status On-going     PT LONG TERM GOAL #5   Title Improve FOTO  to </= 48% limitation 04/14/17   Baseline -   Time 4   Period Weeks   Status On-going               Plan - 03/23/17 7253    Clinical  Impression Statement Patient reports good improvement with DN with less pain and burning in Lt LE. Patient has some difficulty with dry needling but tolerates DN with e-stim.    Rehab Potential Fair   PT Frequency 2x / week   PT Treatment/Interventions Patient/family education;Neuromuscular re-education;ADLs/Self Care Home Management;Cryotherapy;Electrical Stimulation;Iontophoresis 4mg /ml Dexamethasone;Moist Heat;Ultrasound;Dry needling;Manual techniques;Therapeutic activities;Therapeutic exercise   PT Next Visit Plan continue DN in low back/buttocks into neck and shoulders.    Consulted and Agree with Plan of Care Patient      Patient will benefit from skilled therapeutic intervention in order to improve the following deficits and impairments:  Postural dysfunction, Improper body mechanics, Pain, Decreased range of motion, Decreased mobility, Increased muscle spasms, Increased fascial restricitons, Decreased activity tolerance  Visit Diagnosis: Cervicalgia  Abnormal posture  Other symptoms and signs involving the musculoskeletal system     Problem List Patient Active Problem List   Diagnosis Date Noted  . Influenza-like illness 11/12/2016  . Cervical lymphadenopathy 10/29/2016  . Nevus 06/18/2016  . Dark stools 10/31/2015  . Essential hypertension, benign 03/07/2015  . Carpal tunnel syndrome, bilateral 11/29/2014  . Carotid artery dissection ([place]) 04/26/2014  . Cervical spondylosis without myelopathy 04/13/2014  . Perennial allergic rhinitis 11/16/2013  . Spotting 11/16/2013  . Skin tag 11/16/2013  . Obsessive compulsive disorder 10/10/2013  . Hypothyroidism 10/10/2013  . Obesity 10/10/2013  . Annual physical exam 10/10/2013  . Lumbar degenerative disc disease 10/10/2013  . Perimenopausal 10/10/2013     Nilda Simmer PT, MPH  03/23/2017, 8:39 AM  [facility] [place], [place], [address] Phone:  [phone]   Fax:  [phone]  Name: Anyeli Hockenbury MRN: [id number] Date of Birth: January 08, 1966

## 2017-03-25 ENCOUNTER — Other Ambulatory Visit: Payer: Self-pay | Admitting: Sports Medicine

## 2017-03-26 ENCOUNTER — Encounter: Payer: Self-pay | Admitting: Physical Therapy

## 2017-03-26 ENCOUNTER — Ambulatory Visit (INDEPENDENT_AMBULATORY_CARE_PROVIDER_SITE_OTHER): Payer: Managed Care, Other (non HMO) | Admitting: Physical Therapy

## 2017-03-26 DIAGNOSIS — M542 Cervicalgia: Secondary | ICD-10-CM

## 2017-03-26 DIAGNOSIS — R293 Abnormal posture: Secondary | ICD-10-CM

## 2017-03-26 DIAGNOSIS — M6281 Muscle weakness (generalized): Secondary | ICD-10-CM

## 2017-03-26 DIAGNOSIS — R29898 Other symptoms and signs involving the musculoskeletal system: Secondary | ICD-10-CM | POA: Diagnosis not present

## 2017-03-26 DIAGNOSIS — G8929 Other chronic pain: Secondary | ICD-10-CM | POA: Diagnosis not present

## 2017-03-26 DIAGNOSIS — M545 Low back pain: Secondary | ICD-10-CM | POA: Diagnosis not present

## 2017-03-26 NOTE — Therapy (Signed)
[place], [place], [address] Phone: [phone]   Fax:  [phone]  Physical Therapy Treatment  Patient Details  Name: Kelsey Snow MRN: [id number] Date of Birth: 06/28/1966 Referring Provider: Dr Park Breed  Encounter Date: 03/26/2017      PT End of Session - 03/26/17 1247    Visit Number 16   Number of Visits 21   Date for PT Re-Evaluation 04/14/17   PT Start Time 1204   PT Stop Time 1254   PT Time Calculation (min) 50 min   Activity Tolerance Patient tolerated treatment well      Past Medical History:  Diagnosis Date  . Anemia   . Essential hypertension, benign 03/07/2015  . GERD (gastroesophageal reflux disease)   . Hx of ectopic pregnancy   . Hyperlipidemia   . Lumbosacral spondylolysis   . Obese   . OCD (obsessive compulsive disorder)   . Thyroid disease     Past Surgical History:  Procedure Laterality Date  . BACK SURGERY    . ECTOPIC PREGNANCY SURGERY    . TUBAL LIGATION    . TUBAL LIGATION      There were no vitals filed for this visit.      Subjective Assessment - 03/26/17 1204    Subjective Kelsey Snow reports the needling was very intense last visit however she is doing better now, the burning in her Lt LE since then.  She hurt her ankle this weak and has been limping.    Patient Stated Goals get some pain relief.    Currently in Pain? Yes   Pain Score 6    Pain Location Back   Pain Orientation Lower   Pain Descriptors / Indicators Aching   Pain Type Chronic pain   Pain Onset More than a month ago   Pain Frequency Constant   Aggravating Factors  walking and limping   Pain Relieving Factors DN                         [facility] Adult PT Treatment/Exercise - 03/26/17 0001      Modalities   Modalities Electrical Stimulation;Moist Heat     Moist Heat Therapy   Number Minutes Moist Heat 15 Minutes   Moist Heat Location Lumbar  Spine;Cervical  bilat buttocks, ice to Lt foot     Electrical Stimulation   Electrical Stimulation Location bilat buttocks/lumbar   Electrical Stimulation Action IFC    Electrical Stimulation Parameters to tolerance   Electrical Stimulation Goals Tone;Pain     Manual Therapy   Manual Therapy Soft tissue mobilization   Soft tissue mobilization manual work through bilat lumbar paraspinals into buttocks with trigger point work through the [employer] Needling - 03/26/17 1246    Consent Given? Yes   Education Handout Provided No   Muscles Treated Lower Body Gluteus maximus;Gluteus minimus;Piriformis  bilat   Gluteus Maximus Response Palpable increased muscle length;Twitch response elicited   Gluteus Minimus Response Palpable increased muscle length;Twitch response elicited   Piriformis Response Palpable increased muscle length;Twitch response elicited                   PT Long Term Goals - 03/23/17 0630      PT LONG TERM GOAL #1   Title Improve posture and alignment - with scapulae in better alignment along the thoracic wall and head  of the humerus more in anatomical position 04/14/17   Baseline -   Time 4   Period Weeks   Status On-going     PT LONG TERM GOAL #2   Title Increase AROM bilat shoulders allowing patient to reach overhead with greater ease 04/14/17   Baseline -   Time 4   Period Weeks   Status On-going     PT LONG TERM GOAL #3   Title Decrease cervical pain with patient reporting 50% less pain for 50% of the day 04/14/17   Baseline -   Time 4   Period Weeks   Status On-going     PT LONG TERM GOAL #4   Title decrease pain in low back =/> 50% to allow her to return to exercise classes 4 days a week ( 04/14/17)    Time 4   Period Weeks   Status On-going     PT LONG TERM GOAL #5   Title Improve FOTO  to </= 48% limitation 04/14/17   Baseline -   Time 4   Period Weeks   Status On-going               Plan -  03/26/17 1249    Clinical Impression Statement Kelsey Snow continues with significant tightness in her low back/gluts, was able to perform deeper manual work today. She has significant discomfort with DN however recovers well with heat and stim.    Rehab Potential Fair   PT Frequency 2x / week   PT Duration 4 weeks   PT Treatment/Interventions Patient/family education;Neuromuscular re-education;ADLs/Self Care Home Management;Cryotherapy;Electrical Stimulation;Iontophoresis 4mg /ml Dexamethasone;Moist Heat;Ultrasound;Dry needling;Manual techniques;Therapeutic activities;Therapeutic exercise   PT Next Visit Plan continue DN in low back/buttocks into neck and shoulders.    Consulted and Agree with Plan of Care Patient      Patient will benefit from skilled therapeutic intervention in order to improve the following deficits and impairments:  Postural dysfunction, Improper body mechanics, Pain, Decreased range of motion, Decreased mobility, Increased muscle spasms, Increased fascial restricitons, Decreased activity tolerance  Visit Diagnosis: Abnormal posture  Other symptoms and signs involving the musculoskeletal system  Muscle weakness (generalized)  Chronic bilateral low back pain without sciatica  Cervicalgia     Problem List Patient Active Problem List   Diagnosis Date Noted  . Influenza-like illness 11/12/2016  . Cervical lymphadenopathy 10/29/2016  . Nevus 06/18/2016  . Dark stools 10/31/2015  . Essential hypertension, benign 03/07/2015  . Carpal tunnel syndrome, bilateral 11/29/2014  . Carotid artery dissection ([place]) 04/26/2014  . Cervical spondylosis without myelopathy 04/13/2014  . Perennial allergic rhinitis 11/16/2013  . Spotting 11/16/2013  . Skin tag 11/16/2013  . Obsessive compulsive disorder 10/10/2013  . Hypothyroidism 10/10/2013  . Obesity 10/10/2013  . Annual physical exam 10/10/2013  . Lumbar degenerative disc disease 10/10/2013  . Perimenopausal 10/10/2013     Jeral Pinch PT 03/26/2017, 12:51 PM  [facility] [place], [place], [address] Phone: [phone]   Fax:  [phone]  Name: Kelsey Snow MRN: [id number] Date of Birth: [birth year]

## 2017-03-30 ENCOUNTER — Encounter: Payer: Self-pay | Admitting: Physical Therapy

## 2017-03-30 ENCOUNTER — Ambulatory Visit (INDEPENDENT_AMBULATORY_CARE_PROVIDER_SITE_OTHER): Payer: Managed Care, Other (non HMO) | Admitting: Physical Therapy

## 2017-03-30 DIAGNOSIS — R29898 Other symptoms and signs involving the musculoskeletal system: Secondary | ICD-10-CM | POA: Diagnosis not present

## 2017-03-30 DIAGNOSIS — M542 Cervicalgia: Secondary | ICD-10-CM

## 2017-03-30 DIAGNOSIS — G8929 Other chronic pain: Secondary | ICD-10-CM | POA: Diagnosis not present

## 2017-03-30 DIAGNOSIS — M6281 Muscle weakness (generalized): Secondary | ICD-10-CM

## 2017-03-30 DIAGNOSIS — R293 Abnormal posture: Secondary | ICD-10-CM | POA: Diagnosis not present

## 2017-03-30 DIAGNOSIS — M545 Low back pain, unspecified: Secondary | ICD-10-CM

## 2017-03-30 NOTE — Therapy (Signed)
[place], [place], [address] Phone: [phone]   Fax:  [phone]  Physical Therapy Treatment  Patient Details  Name: Kelsey Snow MRN: [id number] Date of Birth: [birth year] Referring Provider: Dr Park Breed  Encounter Date: 03/30/2017      PT End of Session - 03/30/17 0932    Visit Number 17   Number of Visits 21   Date for PT Re-Evaluation 04/14/17   PT Start Time 0932   PT Stop Time 1034   PT Time Calculation (min) 62 min      Past Medical History:  Diagnosis Date  . Anemia   . Essential hypertension, benign 03/07/2015  . GERD (gastroesophageal reflux disease)   . Hx of ectopic pregnancy   . Hyperlipidemia   . Lumbosacral spondylolysis   . Obese   . OCD (obsessive compulsive disorder)   . Thyroid disease     Past Surgical History:  Procedure Laterality Date  . BACK SURGERY    . ECTOPIC PREGNANCY SURGERY    . TUBAL LIGATION    . TUBAL LIGATION      There were no vitals filed for this visit.      Subjective Assessment - 03/30/17 0937    Subjective Kelsey Snow reports she didn't like the way she felt after the last treatment, more pain in the low mid back and into Lt upper thigh.  The outside of her foot is funky feeling. She is not sure if she is getting relief overall since starting the DN.   Currently in Pain? Yes   Pain Score 6    Pain Location Back   Pain Orientation Lower   Pain Descriptors / Indicators Aching   Pain Type Chronic pain   Pain Onset More than a month ago   Pain Frequency Constant   Aggravating Factors  walking   Pain Relieving Factors heat   Multiple Pain Sites Yes   Pain Score 4   Pain Location Leg   Pain Orientation Left   Pain Descriptors / Indicators Aching;Dull   Pain Type Chronic pain   Pain Onset More than a month ago   Pain Frequency Constant   Pain Relieving Factors heat                         [facility] Adult PT  Treatment/Exercise - 03/30/17 0001      Exercises   Exercises --  supine HS stretch with strap x 60 sec     Modalities   Modalities Electrical Stimulation;Moist Heat     Moist Heat Therapy   Number Minutes Moist Heat 20 Minutes   Moist Heat Location Lumbar Spine;Cervical  thoracic, and posterior Lt thigh     Electrical Stimulation   Electrical Stimulation Location Lt thigh and buttocks   Electrical Stimulation Action Premod   Electrical Stimulation Parameters to tolerance   Electrical Stimulation Goals Tone;Pain     Manual Therapy   Manual Therapy Soft tissue mobilization;Joint mobilization   Joint Mobilization grade II CPA mobs in thoracic region    Soft tissue mobilization STM to Lt hamstring and bilat gluts          Trigger Point Dry Needling - 03/30/17 0932    Consent Given? Yes   Education Handout Provided No   Muscles Treated Lower Body Hamstring  Lt   Hamstring Response Palpable increased muscle length;Twitch response elicited  with stim, two channels  PT Long Term Goals - 03/30/17 0954      PT LONG TERM GOAL #1   Title Improve posture and alignment - with scapulae in better alignment along the thoracic wall and head of the humerus more in anatomical position 04/14/17   Status On-going     PT LONG TERM GOAL #2   Title Increase AROM bilat shoulders allowing patient to reach overhead with greater ease 04/14/17   Status On-going     PT LONG TERM GOAL #3   Title Decrease cervical pain with patient reporting 50% less pain for 50% of the day 04/14/17   Status On-going     PT LONG TERM GOAL #4   Title decrease pain in low back =/> 50% to allow her to return to exercise classes 4 days a week ( 04/14/17)    Status On-going     PT LONG TERM GOAL #5   Title Improve FOTO  to </= 48% limitation 04/14/17   Status On-going             Patient will benefit from skilled therapeutic intervention in order to improve the following  deficits and impairments:     Visit Diagnosis: Abnormal posture  Other symptoms and signs involving the musculoskeletal system  Muscle weakness (generalized)  Chronic bilateral low back pain without sciatica  Cervicalgia     Problem List Patient Active Problem List   Diagnosis Date Noted  . Influenza-like illness 11/12/2016  . Cervical lymphadenopathy 10/29/2016  . Nevus 06/18/2016  . Dark stools 10/31/2015  . Essential hypertension, benign 03/07/2015  . Carpal tunnel syndrome, bilateral 11/29/2014  . Carotid artery dissection ([place]) 04/26/2014  . Cervical spondylosis without myelopathy 04/13/2014  . Perennial allergic rhinitis 11/16/2013  . Spotting 11/16/2013  . Skin tag 11/16/2013  . Obsessive compulsive disorder 10/10/2013  . Hypothyroidism 10/10/2013  . Obesity 10/10/2013  . Annual physical exam 10/10/2013  . Lumbar degenerative disc disease 10/10/2013  . Perimenopausal 10/10/2013    Jeral Pinch PT 03/30/2017, 11:08 AM  [facility] [place], [place], [address] Phone: [phone]   Fax:  [phone]  Name: Kelsey Snow MRN: [id number] Date of Birth: 1966-08-03

## 2017-04-01 ENCOUNTER — Encounter: Payer: Self-pay | Admitting: Rehabilitative and Restorative Service Providers"

## 2017-04-02 ENCOUNTER — Encounter: Payer: Self-pay | Admitting: Rehabilitative and Restorative Service Providers"

## 2017-04-06 ENCOUNTER — Encounter: Payer: Self-pay | Admitting: Physical Therapy

## 2017-04-06 ENCOUNTER — Ambulatory Visit (INDEPENDENT_AMBULATORY_CARE_PROVIDER_SITE_OTHER): Payer: BLUE CROSS/BLUE SHIELD | Admitting: Physical Therapy

## 2017-04-06 DIAGNOSIS — R293 Abnormal posture: Secondary | ICD-10-CM

## 2017-04-06 DIAGNOSIS — M542 Cervicalgia: Secondary | ICD-10-CM | POA: Diagnosis not present

## 2017-04-06 DIAGNOSIS — M6281 Muscle weakness (generalized): Secondary | ICD-10-CM | POA: Diagnosis not present

## 2017-04-06 DIAGNOSIS — M545 Low back pain: Secondary | ICD-10-CM | POA: Diagnosis not present

## 2017-04-06 DIAGNOSIS — G8929 Other chronic pain: Secondary | ICD-10-CM

## 2017-04-06 DIAGNOSIS — R29898 Other symptoms and signs involving the musculoskeletal system: Secondary | ICD-10-CM | POA: Diagnosis not present

## 2017-04-06 NOTE — Therapy (Signed)
[place], [place], [address] Phone: [phone]   Fax:  [phone]  Physical Therapy Treatment  Patient Details  Name: Kelsey Snow MRN: [id number] Date of Birth: 1965-12-05 Referring Provider: Dr Park Breed  Encounter Date: 04/06/2017      PT End of Session - 04/06/17 0843    Visit Number 18   Number of Visits 21   Date for PT Re-Evaluation 04/14/17   PT Start Time 0843   PT Stop Time 0946   PT Time Calculation (min) 63 min   Activity Tolerance Patient tolerated treatment well      Past Medical History:  Diagnosis Date  . Anemia   . Essential hypertension, benign 03/07/2015  . GERD (gastroesophageal reflux disease)   . Hx of ectopic pregnancy   . Hyperlipidemia   . Lumbosacral spondylolysis   . Obese   . OCD (obsessive compulsive disorder)   . Thyroid disease     Past Surgical History:  Procedure Laterality Date  . BACK SURGERY    . ECTOPIC PREGNANCY SURGERY    . TUBAL LIGATION    . TUBAL LIGATION      There were no vitals filed for this visit.      Subjective Assessment - 04/06/17 0845    Subjective Kelsey Snow thinks she had more relief with the treating the lower back, when we got deeper and down the Lt LE her pain was worse and the post treatment pain was worse. She returns to baseline level of pain after.     Patient Stated Goals get some pain relief.    Currently in Pain? Yes   Pain Score 9    Pain Location Back   Pain Orientation Lower   Pain Descriptors / Indicators Aching   Pain Type Chronic pain   Pain Onset More than a month ago   Pain Frequency Constant   Aggravating Factors  dancing   Pain Relieving Factors time and heat to get to her baseline   Multiple Pain Sites Yes   Pain Score 9   Pain Location Leg   Pain Orientation Left   Pain Descriptors / Indicators Aching   Pain Type Chronic pain   Pain Onset More than a month ago   Pain Frequency  Constant   Aggravating Factors  dancing   Pain Relieving Factors heat and time            [facility] PT Assessment - 04/06/17 0001      Assessment   Medical Diagnosis myfascial pain syndrome/cervical DDD     AROM   Right Shoulder Flexion 153 Degrees   Left Shoulder Flexion 158 Degrees   Cervical - Right Rotation 70   Cervical - Left Rotation 20                     [facility] Adult PT Treatment/Exercise - 04/06/17 0001      Modalities   Modalities Electrical Stimulation;Moist Heat     Moist Heat Therapy   Number Minutes Moist Heat 20 Minutes   Moist Heat Location Lumbar Spine;Cervical     Electrical Stimulation   Electrical Stimulation Location low back    Electrical Stimulation Action IFC   Electrical Stimulation Parameters to tolerance   Electrical Stimulation Goals Tone;Pain     Manual Therapy   Manual Therapy Soft tissue mobilization;Joint mobilization   Soft tissue mobilization STM to low back and buttocks  Trigger Point Dry Needling - 04/06/17 0854    Consent Given? Yes   Education Handout Provided No   Muscles Treated Upper Body Longissimus   Longissimus Response Palpable increased muscle length;Twitch response elicited  bilat with stim L 1-4                   PT Long Term Goals - 04/06/17 0851      PT LONG TERM GOAL #1   Title Improve posture and alignment - with scapulae in better alignment along the thoracic wall and head of the humerus more in anatomical position 04/14/17   Status On-going     PT LONG TERM GOAL #2   Title Increase AROM bilat shoulders allowing patient to reach overhead with greater ease 04/14/17   Status Partially Met  able to reach overhead however not easier     PT LONG TERM GOAL #3   Title Decrease cervical pain with patient reporting 50% less pain for 50% of the day 04/14/17   Status On-going     PT LONG TERM GOAL #4   Title decrease pain in low back =/> 50% to allow her to return to exercise  classes 4 days a week ( 04/14/17)    Status On-going     PT LONG TERM GOAL #5   Title Improve FOTO  to </= 48% limitation 04/14/17   Status On-going               Plan - 04/06/17 0904    Clinical Impression Statement This is Kelsey Snow's third week of trying manual therapy and dry needling with minimal results. We will try one more week of therapy with focus on the paraspinals as she did get some relief with that in the beginning.  IF no change will discharge next week and have her return to the MDContinues to be very sensitive to palpation in the low back and bilat buttocks.    Rehab Potential Fair   PT Frequency 2x / week   PT Duration 4 weeks   PT Treatment/Interventions Patient/family education;Neuromuscular re-education;ADLs/Self Care Home Management;Cryotherapy;Electrical Stimulation;Iontophoresis '4mg'$ /ml Dexamethasone;Moist Heat;Ultrasound;Dry needling;Manual techniques;Therapeutic activities;Therapeutic exercise   PT Next Visit Plan Try some DN in her neck one more week of therapy   Consulted and Agree with Plan of Care Patient      Patient will benefit from skilled therapeutic intervention in order to improve the following deficits and impairments:  Postural dysfunction, Improper body mechanics, Pain, Decreased range of motion, Decreased mobility, Increased muscle spasms, Increased fascial restricitons, Decreased activity tolerance  Visit Diagnosis: Abnormal posture  Other symptoms and signs involving the musculoskeletal system  Muscle weakness (generalized)  Chronic bilateral low back pain without sciatica  Cervicalgia     Problem List Patient Active Problem List   Diagnosis Date Noted  . Influenza-like illness 11/12/2016  . Cervical lymphadenopathy 10/29/2016  . Nevus 06/18/2016  . Dark stools 10/31/2015  . Essential hypertension, benign 03/07/2015  . Carpal tunnel syndrome, bilateral 11/29/2014  . Carotid artery dissection ([place]) 04/26/2014  . Cervical  spondylosis without myelopathy 04/13/2014  . Perennial allergic rhinitis 11/16/2013  . Spotting 11/16/2013  . Skin tag 11/16/2013  . Obsessive compulsive disorder 10/10/2013  . Hypothyroidism 10/10/2013  . Obesity 10/10/2013  . Annual physical exam 10/10/2013  . Lumbar degenerative disc disease 10/10/2013  . Perimenopausal 10/10/2013    Jeral Pinch PT  04/06/2017, 9:28 AM  [facility] [place], [place], [address] Phone:  [phone]   Fax:  [phone]  Name: Kelsey Snow MRN: [id number] Date of Birth: April 16, 1966

## 2017-04-09 ENCOUNTER — Ambulatory Visit (INDEPENDENT_AMBULATORY_CARE_PROVIDER_SITE_OTHER): Payer: BLUE CROSS/BLUE SHIELD | Admitting: Physical Therapy

## 2017-04-09 ENCOUNTER — Encounter: Payer: Self-pay | Admitting: Physical Therapy

## 2017-04-09 DIAGNOSIS — R293 Abnormal posture: Secondary | ICD-10-CM

## 2017-04-09 DIAGNOSIS — M542 Cervicalgia: Secondary | ICD-10-CM

## 2017-04-09 DIAGNOSIS — G8929 Other chronic pain: Secondary | ICD-10-CM | POA: Diagnosis not present

## 2017-04-09 DIAGNOSIS — M545 Low back pain: Secondary | ICD-10-CM | POA: Diagnosis not present

## 2017-04-09 DIAGNOSIS — M6281 Muscle weakness (generalized): Secondary | ICD-10-CM | POA: Diagnosis not present

## 2017-04-09 DIAGNOSIS — R29898 Other symptoms and signs involving the musculoskeletal system: Secondary | ICD-10-CM

## 2017-04-09 NOTE — Therapy (Addendum)
[place], [place], [address] Phone: [phone]   Fax:  [phone]  Physical Therapy Treatment  Patient Details  Name: Kelsey Snow MRN: [id number] Date of Birth: Sep 06, 1966 Referring Provider: Dr Park Breed  Encounter Date: 04/09/2017      PT End of Session - 04/09/17 0856    Visit Number 19   Number of Visits 21   Date for PT Re-Evaluation 04/14/17   PT Start Time 0856  in late   PT Stop Time 0947   PT Time Calculation (min) 51 min   Activity Tolerance Patient tolerated treatment well      Past Medical History:  Diagnosis Date  . Anemia   . Essential hypertension, benign 03/07/2015  . GERD (gastroesophageal reflux disease)   . Hx of ectopic pregnancy   . Hyperlipidemia   . Lumbosacral spondylolysis   . Obese   . OCD (obsessive compulsive disorder)   . Thyroid disease     Past Surgical History:  Procedure Laterality Date  . BACK SURGERY    . ECTOPIC PREGNANCY SURGERY    . TUBAL LIGATION    . TUBAL LIGATION      There were no vitals filed for this visit.      Subjective Assessment - 04/09/17 0856    Subjective Pt feeling sore today, has been busy last couple of days. No change in her low back pain after the last visit, still having pain. Ready to try Dn on her neck today.    Currently in Pain? Yes   Pain Score 7    Pain Location Back   Pain Orientation Lower   Pain Score 5   Pain Location Neck   Pain Orientation Left;Right   Pain Descriptors / Indicators Aching;Dull   Pain Type Chronic pain   Pain Onset More than a month ago   Pain Frequency Constant                         [facility] Adult PT Treatment/Exercise - 04/09/17 0001      Modalities   Modalities Electrical Stimulation;Moist Heat     Moist Heat Therapy   Number Minutes Moist Heat 20 Minutes   Moist Heat Location --  thoracic paraspinals upper shoulders and neck     Electrical  Stimulation   Electrical Stimulation Location neck and thoracic   Electrical Stimulation Action IFC   Electrical Stimulation Parameters  to tolerance   Electrical Stimulation Goals Tone;Pain     Manual Therapy   Manual Therapy Soft tissue mobilization;Joint mobilization   Soft tissue mobilization STM bilat uper shoulders and peri cervical, thoracic paraspinals          Trigger Point Dry Needling - 04/09/17 0859    Consent Given? Yes   Education Handout Provided No   Muscles Treated Upper Body Upper trapezius;Levator scapulae;Longissimus   Upper Trapezius Response Twitch reponse elicited;Palpable increased muscle length   Levator Scapulae Response Palpable increased muscle length;Twitch response elicited   Longissimus Response Twitch response elicited;Palpable increased muscle length  thoracic paraspinas T2-T6                   PT Long Term Goals - 04/06/17 0851      PT LONG TERM GOAL #1   Title Improve posture and alignment - with scapulae in better alignment along the thoracic wall and head of the humerus more in anatomical position 04/14/17   Status  On-going     PT LONG TERM GOAL #2   Title Increase AROM bilat shoulders allowing patient to reach overhead with greater ease 04/14/17   Status Partially Met  able to reach overhead however not easier     PT LONG TERM GOAL #3   Title Decrease cervical pain with patient reporting 50% less pain for 50% of the day 04/14/17   Status On-going     PT LONG TERM GOAL #4   Title decrease pain in low back =/> 50% to allow her to return to exercise classes 4 days a week ( 04/14/17)    Status On-going     PT LONG TERM GOAL #5   Title Improve FOTO  to </= 48% limitation 04/14/17   Status On-going               Plan - 04/09/17 0928    Clinical Impression Statement Kelsey Snow was very senstitive to manual work and DN to upper traps and thoracic area.  Did reports some relief after treatement.    Rehab Potential Fair   PT  Frequency 2x / week   PT Duration 4 weeks   PT Treatment/Interventions Patient/family education;Neuromuscular re-education;ADLs/Self Care Home Management;Cryotherapy;Electrical Stimulation;Iontophoresis 42m/ml Dexamethasone;Moist Heat;Ultrasound;Dry needling;Manual techniques;Therapeutic activities;Therapeutic exercise   PT Next Visit Plan assess response to DN upper body, most likely discharge and refer back to MD   Consulted and Agree with Plan of Care Patient      Patient will benefit from skilled therapeutic intervention in order to improve the following deficits and impairments:  Postural dysfunction, Improper body mechanics, Pain, Decreased range of motion, Decreased mobility, Increased muscle spasms, Increased fascial restricitons, Decreased activity tolerance  Visit Diagnosis: Abnormal posture  Other symptoms and signs involving the musculoskeletal system  Muscle weakness (generalized)  Chronic bilateral low back pain without sciatica  Cervicalgia     Problem List Patient Active Problem List   Diagnosis Date Noted  . Influenza-like illness 11/12/2016  . Cervical lymphadenopathy 10/29/2016  . Nevus 06/18/2016  . Dark stools 10/31/2015  . Essential hypertension, benign 03/07/2015  . Carpal tunnel syndrome, bilateral 11/29/2014  . [place] 04/26/2014  . Cervical spondylosis without myelopathy 04/13/2014  . Perennial allergic rhinitis 11/16/2013  . Spotting 11/16/2013  . Skin tag 11/16/2013  . Obsessive compulsive disorder 10/10/2013  . Hypothyroidism 10/10/2013  . Obesity 10/10/2013  . Annual physical exam 10/10/2013  . Lumbar degenerative disc disease 10/10/2013  . Perimenopausal 10/10/2013    SJeral PinchPT  04/09/2017, 9:29 AM  [email or web address] [address]Phone: [phone]  Fax:  [phone] Name: Kelsey CataniaMRN: [id number] of  Birth: 805/10/1965  PHYSICAL THERAPY DISCHARGE SUMMARY  Visits from Start of Care: 19  Current functional level related to goals / functional outcomes: See above   Remaining deficits: See above   Education / Equipment: HEP Plan: Patient agrees to discharge.  Patient goals were not met. Patient is being discharged due to lack of progress.  ?????    SJeral Pinch PT 05/24/17 10:21 AM

## 2017-04-12 ENCOUNTER — Other Ambulatory Visit: Payer: Self-pay | Admitting: Sports Medicine

## 2017-04-15 ENCOUNTER — Encounter: Payer: Self-pay | Admitting: Physical Therapy

## 2017-05-09 ENCOUNTER — Other Ambulatory Visit: Payer: Self-pay | Admitting: Sports Medicine

## 2017-05-12 ENCOUNTER — Encounter: Payer: Self-pay | Admitting: Sports Medicine

## 2017-05-17 ENCOUNTER — Encounter: Payer: Self-pay | Admitting: Emergency Medicine

## 2017-05-17 ENCOUNTER — Emergency Department
Admission: EM | Admit: 2017-05-17 | Discharge: 2017-05-17 | Disposition: A | Discharge status: Home / Self Care | Payer: Managed Care, Other (non HMO) | Source: Home / Self Care | Attending: Family Medicine | Admitting: Family Medicine

## 2017-05-17 DIAGNOSIS — R21 Rash and other nonspecific skin eruption: Secondary | ICD-10-CM | POA: Diagnosis not present

## 2017-05-17 MED ORDER — VALACYCLOVIR HCL 1 G PO TABS: 1000.0000 mg | ORAL_TABLET | Freq: Three times a day (TID) | ORAL | 0 refills | Status: DC

## 2017-05-17 MED ORDER — METHYLPREDNISOLONE ACETATE 80 MG/ML IJ SUSP
80.0000 mg | Freq: Once | INTRAMUSCULAR | Status: AC
  Administered 2017-05-17: 80 mg via INTRAMUSCULAR

## 2017-05-17 NOTE — ED Provider Notes (Signed)
[name]    CSN: [id number] Arrival date & time: 05/17/17  1207     History   Chief Complaint Chief Complaint  Patient presents with  . Rash    HPI Kelsey Snow is a 51 y.o. female.   Five days ago patient noticed a small pimple-like lesion of her left biceps area.  She subsequently developed an expanding pruritic vesicular eruption on her biceps, and also has noticed two tiny lesions on her left forearm.  She feels well otherwise.  She believes that she may have contacted poison ivy while petting her dog.  She admits that she has been under stress recently.   The history is provided by the patient.  Rash  Location: left upper arm. Quality: blistering, dryness, itchiness, redness and weeping   Quality: not draining, not painful, not peeling, not scaling and not swelling   Severity:  Mild Onset quality:  Sudden Duration:  5 days Timing:  Constant Progression:  Worsening Chronicity:  New Context: animal contact   Context: not insect bite/sting   Relieved by:  Nothing Worsened by:  Nothing Ineffective treatments:  Topical steroids Associated symptoms: no diarrhea, no fatigue, no fever, no induration and no sore throat     Past Medical History:  Diagnosis Date  . Anemia   . Essential hypertension, benign 03/07/2015  . GERD (gastroesophageal reflux disease)   . Hx of ectopic pregnancy   . Hyperlipidemia   . Lumbosacral spondylolysis   . Obese   . OCD (obsessive compulsive disorder)   . Thyroid disease     Patient Active Problem List   Diagnosis Date Noted  . Influenza-like illness 11/12/2016  . Cervical lymphadenopathy 10/29/2016  . Nevus 06/18/2016  . Dark stools 10/31/2015  . Essential hypertension, benign 03/07/2015  . Carpal tunnel syndrome, bilateral 11/29/2014  . 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 Cervical spondylosis without myelopathy 04/13/2014  . Perennial allergic rhinitis 11/16/2013  . Spotting 11/16/2013  . Skin  tag 11/16/2013  . Obsessive compulsive disorder 10/10/2013  . Hypothyroidism 10/10/2013  . Obesity 10/10/2013  . Annual physical exam 10/10/2013  . Lumbar degenerative disc disease 10/10/2013  . Perimenopausal 10/10/2013    Past Surgical History:  Procedure Laterality Date  . BACK SURGERY    . ECTOPIC PREGNANCY SURGERY    . TUBAL LIGATION    . TUBAL LIGATION      OB History    No data available       Home Medications    Prior to Admission medications   Medication Sig Start Date End Date Taking? Authorizing Provider  aspirin 81 MG EC tablet TAKE 1 TABLET (81 MG TOTAL) BY MOUTH DAILY. 08/22/15   Silverio Decamp, MD  Cholecalciferol (VITAMIN D3) 2000 UNITS TABS Take by mouth 2 (two) times a week. TAKE 2 TABLETS Sunday AND 1 TABLET Tuesday AND Thursday.    [provider]  escitalopram (LEXAPRO) 20 MG tablet TAKE 1 TABLET (20 MG TOTAL) BY MOUTH DAILY. 03/25/17   Silverio Decamp, MD  fexofenadine (ALLEGRA) 180 MG tablet Take 1 tablet (180 mg total) by mouth at bedtime. 03/03/17   Silverio Decamp, MD  levothyroxine (SYNTHROID, LEVOTHROID) 137 MCG tablet Take 1 tablet (137 mcg total) by mouth daily before breakfast. 01/11/17   Silverio Decamp, MD  Mag Aspart-Potassium Aspart (POTASSIUM & MAGNESIUM ASPARTAT) 250-250 MG CAPS Take by mouth. TAKE 2 CAPSULES Tuesday  AND Thursday  MORNING    [provider]  montelukast (  SINGULAIR) 10 MG tablet Take 1 tablet (10 mg total) by mouth at bedtime. 03/03/17   Silverio Decamp, MD  omeprazole (PRILOSEC) 40 MG capsule Take 1 capsule (40 mg total) by mouth daily. 01/26/17   Silverio Decamp, MD  oxyCODONE-acetaminophen (PERCOCET/ROXICET) 5-325 MG tablet Take by mouth. 11/26/16   [provider]  rosuvastatin (CRESTOR) 10 MG tablet TAKE 1 TABLET (10 MG TOTAL) BY MOUTH DAILY. 05/10/17   Silverio Decamp, MD  TraMADol HCl 300 MG CP24 Take 1 capsule by mouth daily. 01/14/16   Silverio Decamp, MD  traZODone (DESYREL) 100 MG tablet TAKE 1 OR 2 TABLETS BY MOUTH AT BEDTIME AS DIRECTED 04/12/17   Silverio Decamp, MD  valACYclovir (VALTREX) 1000 MG tablet Take 1 tablet (1,000 mg total) by mouth 3 (three) times daily. 05/17/17   Kandra Nicolas, MD    Family History Family History  Problem Relation Age of Onset  . Aneurysm Father   . AAA (abdominal aortic aneurysm) Father   . Depression Mother   . Cancer Mother        Pancreatic  . Thyroid disease Mother        hypothyroidism  . Varicose Veins Mother   . Drug abuse Sister   . Hyperlipidemia Brother   . Diabetes Maternal Grandmother   . Alcohol abuse Maternal Uncle     Social History Social History  Substance Use Topics  . Smoking status: Never Smoker  . Smokeless tobacco: Never Used  . Alcohol use 7.2 - 7.8 oz/week    6 - 7 Glasses of wine, 6 Standard drinks or equivalent per week     Allergies   Bee venom   Review of Systems Review of Systems  Constitutional: Negative for fatigue and fever.  HENT: Negative for sore throat.   Gastrointestinal: Negative for diarrhea.  Skin: Positive for rash.  All other systems reviewed and are negative.    Physical Exam Triage Vital Signs ED Triage Vitals  Enc Vitals Group     BP 05/17/17 1242 134/80     Pulse Rate 05/17/17 1242 76     Resp --      Temp 05/17/17 1242 98.3 F (36.8 C)     Temp Source 05/17/17 1242 Oral     SpO2 05/17/17 1242 96 %     Weight 05/17/17 1243 267 lb (121.1 kg)     Height --      Head Circumference --      Peak Flow --      Pain Score 05/17/17 1243 2     Pain Loc --      Pain Edu? --      Excl. in [place]? --    No data found.   Updated Vital Signs BP 134/80 (BP Location: Right Arm)   Pulse 76   Temp 98.3 F (36.8 C) (Oral)   Wt 267 lb (121.1 kg)   SpO2 96%   BMI 45.83 kg/m   Visual Acuity Right Eye Distance:   Left Eye Distance:   Bilateral Distance:    Right Eye Near:   Left Eye Near:    Bilateral  Near:     Physical Exam  Constitutional: She appears well-developed and well-nourished. No distress.  HENT:  Head: Normocephalic.  Right Ear: External ear normal.  Left Ear: External ear normal.  Nose: Nose normal.  Mouth/Throat: Oropharynx is clear and moist.  Eyes: Pupils are equal, round, and reactive to light. EOM are normal.  Neck: Neck supple.  Cardiovascular: Normal rate and regular rhythm.   Pulmonary/Chest: Breath sounds normal.  Musculoskeletal: She exhibits no edema.       Arms: Left biceps area has a crop of clear vesicles and one bulla on erythematous base.  There are two tiny 28mm vesicles on left forearm.  Neurological: She is alert.  Skin: Skin is warm and dry.  Nursing note and vitals reviewed.     UC Treatments / Results  Labs (all labs ordered are listed, but only abnormal results are displayed) Labs Reviewed  VIRAL CULTURE [facility]    EKG  EKG Interpretation None       Radiology No results found.  Procedures Procedures (including critical care time)  Medications Ordered in UC Medications  methylPREDNISolone acetate (DEPO-MEDROL) injection 80 mg (80 mg Intramuscular Given 05/17/17 1257)     Initial Impression / Assessment and Plan / UC Course  I have reviewed the triage vital signs and the nursing notes.  Pertinent labs & imaging results that were available during my care of the patient were reviewed by me and considered in my medical decision making (see chart for details).    Herpes zoster vs contact dermatitis (plant).  Viral culture pending. Begin empiric Valtrex. Administered Depo Medrol 80mg  IM  May apply calamine lotion for itching.  May also take Benadryl at bedtime. Followup with Family Doctor as scheduled.    Final Clinical Impressions(s) / UC Diagnoses   Final diagnoses:  Rash and nonspecific skin eruption    New Prescriptions New Prescriptions   VALACYCLOVIR (VALTREX) 1000 MG TABLET    Take 1 tablet (1,000 mg total) by  mouth 3 (three) times daily.     Kandra Nicolas, MD 05/17/17 [phone]

## 2017-05-17 NOTE — Discharge Instructions (Signed)
May apply calamine lotion for itching.  May also take Benadryl at bedtime.

## 2017-05-17 NOTE — ED Triage Notes (Signed)
Pt c/o blistery rash on left arm x3 days. States it is painful and burning.

## 2017-05-18 ENCOUNTER — Encounter: Payer: Self-pay | Admitting: Sports Medicine

## 2017-05-18 ENCOUNTER — Ambulatory Visit (INDEPENDENT_AMBULATORY_CARE_PROVIDER_SITE_OTHER): Payer: Managed Care, Other (non HMO) | Admitting: Sports Medicine

## 2017-05-18 DIAGNOSIS — L237 Allergic contact dermatitis due to plants, except food: Secondary | ICD-10-CM

## 2017-05-18 DIAGNOSIS — S86312A Strain of muscle(s) and tendon(s) of peroneal muscle group at lower leg level, left leg, initial encounter: Secondary | ICD-10-CM | POA: Diagnosis not present

## 2017-05-18 MED ORDER — TRIAMCINOLONE ACETONIDE 0.5 % EX OINT: 1.0000 "application " | TOPICAL_OINTMENT | Freq: Two times a day (BID) | CUTANEOUS | 3 refills | Status: DC

## 2017-05-18 NOTE — Progress Notes (Signed)
  Subjective:    CC: Ankle pain  HPI: Months ago this 51 year old walking, inverted her ankle and felt a tearing sensation. Since then she's had pain and swelling behind the lateral malleolus on the left side with frequent inversions. Moderate, persistent without radiation.  Skin rash: Left-sided, present for weeks, occurred after her dog ran around outside, she then called with the dog and developed a profoundly pruritic rash. No prodromal symptoms beforehand.  Past medical history:  Negative.  See flowsheet/record as well for more information.  Surgical history: Negative.  See flowsheet/record as well for more information.  Family history: Negative.  See flowsheet/record as well for more information.  Social history: Negative.  See flowsheet/record as well for more information.  Allergies, and medications have been entered into the medical record, reviewed, and no changes needed.   Review of Systems: No fevers, chills, night sweats, weight loss, chest pain, or shortness of breath.   Objective:    General: Well Developed, well nourished, and in no acute distress.  Neuro: Alert and oriented x3, extra-ocular muscles intact, sensation grossly intact.  HEENT: Normocephalic, atraumatic, pupils equal round reactive to light, neck supple, no masses, no lymphadenopathy, thyroid nonpalpable.  Skin: Warm and dry, There is a large plaque of a papulovesicular lesion on the left upper arm, as well as linear papulovesicular lesions on the left forearm consistent with poison ivy dermatitis. Cardiac: Regular rate and rhythm, no murmurs rubs or gallops, no lower extremity edema.  Respiratory: Clear to auscultation bilaterally. Not using accessory muscles, speaking in full sentences. Left Ankle: Swollen behind the lateral malleolus Range of motion is full in all directions. Strength is 5/5 in all directions. Stable lateral and medial ligaments; squeeze test and kleiger test unremarkable; Talar dome  nontender; No pain at base of 5th MT; No tenderness over cuboid; No tenderness over N spot or navicular prominence Tender to palpation behind lateral malleolus with reduction of pain with resisted eversion of the ankle. Negative tarsal tunnel tinel's Able to walk 4 steps.  Impression and Recommendations:    Strain of peroneal tendon, left, initial encounter Suspect peroneal tendon strain. CAM boot for 2 weeks, formal physical therapy. Return to see me in one month, injection if no better.  Poison ivy dermatitis This does not resemble shingles, adding topical triamcinolone, she did have an intramuscular steroid injection with urgent care. Discontinue Valtrex.  I spent 25 minutes with this patient, greater than 50% was face-to-face time counseling regarding the above diagnoses

## 2017-05-18 NOTE — Assessment & Plan Note (Signed)
Suspect peroneal tendon strain. CAM boot for 2 weeks, formal physical therapy. Return to see me in one month, injection if no better.

## 2017-05-18 NOTE — Assessment & Plan Note (Signed)
This does not resemble shingles, adding topical triamcinolone, she did have an intramuscular steroid injection with urgent care. Discontinue Valtrex.

## 2017-05-19 ENCOUNTER — Other Ambulatory Visit: Payer: Self-pay | Admitting: Sports Medicine

## 2017-05-22 LAB — REFLEX ADENOVIRUS CULTURE

## 2017-05-22 LAB — RFX HSV/VARICELLA ZOSTER RAPID CULT

## 2017-05-24 ENCOUNTER — Encounter: Payer: Self-pay | Admitting: Sports Medicine

## 2017-05-25 ENCOUNTER — Telehealth: Payer: Self-pay | Admitting: *Deleted

## 2017-05-25 LAB — VIRAL CULTURE VIRC

## 2017-05-25 LAB — CYTOMEGALOVIRUS CULTURE

## 2017-05-25 LAB — HM MAMMOGRAPHY

## 2017-05-25 MED ORDER — PREDNISONE 10 MG (48) PO TBPK: ORAL_TABLET | Freq: Every day | ORAL | 0 refills | Status: DC

## 2017-05-25 NOTE — Telephone Encounter (Signed)
Patient advised of lab results received thus far. Viral culture is still pending. She reports rash is improved but not gone and is still having a lot of itching. She has been in touch with Dr. [name] about oral steroids.

## 2017-05-26 ENCOUNTER — Telehealth: Payer: Self-pay | Admitting: Emergency Medicine

## 2017-05-26 NOTE — Telephone Encounter (Signed)
All labs negative.

## 2017-06-01 ENCOUNTER — Ambulatory Visit (INDEPENDENT_AMBULATORY_CARE_PROVIDER_SITE_OTHER): Payer: Managed Care, Other (non HMO) | Admitting: Physical Therapy

## 2017-06-01 ENCOUNTER — Encounter: Payer: Self-pay | Admitting: Physical Therapy

## 2017-06-01 DIAGNOSIS — R29898 Other symptoms and signs involving the musculoskeletal system: Secondary | ICD-10-CM

## 2017-06-01 DIAGNOSIS — R293 Abnormal posture: Secondary | ICD-10-CM

## 2017-06-01 DIAGNOSIS — M25672 Stiffness of left ankle, not elsewhere classified: Secondary | ICD-10-CM

## 2017-06-01 DIAGNOSIS — M6281 Muscle weakness (generalized): Secondary | ICD-10-CM

## 2017-06-01 DIAGNOSIS — M25572 Pain in left ankle and joints of left foot: Secondary | ICD-10-CM

## 2017-06-01 NOTE — Patient Instructions (Addendum)
ANKLE: Eversion, Bilateral (Band)    Place band around feet. Keeping heels on floor, raise toes of both feet up and away from body. Do not move hips. Hold __1-2_ seconds. Use ___red_____ band. _10__ reps per set, __3_ sets per day, _7__ days per week  Balance: Three-Way Leg Swing    Stand on left foot, hands on hips. Reach other foot forward __1__ times, sideways __1__ times, back __1__ times. Hold each position __1__ seconds. Relax. Repeat __10__ times per set. Do __3__ sets per session. Do __1__ sessions per day.   Gastroc Stretch    Stand with left foot back, leg straight, forward leg bent. Keeping heel on floor, turned slightly out, lean into wall until stretch is felt in calf. Hold __45__ seconds. Repeat _1___ times per set. Do ___1_ sets per session. Do ___1_ sessions per day.   Soleus Stretch    Stand with left foot back, both knees bent. Keeping heel on floor, turned slightly out, lean into wall until stretch is felt in lower calf. Hold __45__ seconds. Repeat _1___ times per set. Do __1__ sets per session. Do _1___ sessions per day.

## 2017-06-01 NOTE — Therapy (Signed)
[place], [place], [address] Phone: [phone]   Fax:  [phone]  Physical Therapy Evaluation  Patient Details  Name: Kelsey Snow MRN: [id number] Date of Birth: 23-Jul-1966 Referring Provider: Dr [name]  Encounter Date: 06/01/2017      PT End of Session - 06/01/17 1105    Visit Number 1   Number of Visits 12   Date for PT Re-Evaluation 07/13/17   PT Start Time 1105   PT Stop Time 1202   PT Time Calculation (min) 57 min   Activity Tolerance Patient tolerated treatment well      Past Medical History:  Diagnosis Date  . Anemia   . Essential hypertension, benign 03/07/2015  . GERD (gastroesophageal reflux disease)   . Hx of ectopic pregnancy   . Hyperlipidemia   . Lumbosacral spondylolysis   . Obese   . OCD (obsessive compulsive disorder)   . Thyroid disease     Past Surgical History:  Procedure Laterality Date  . BACK SURGERY    . ECTOPIC PREGNANCY SURGERY    . TUBAL LIGATION    . TUBAL LIGATION      There were no vitals filed for this visit.       Subjective Assessment - 06/01/17 1111    Subjective Pt reports she has had intermittent Lt ankle pain since a graduation party in June and turning her ankle.  She has neuropathy and can't feel her ankle and kept turning it and having swelling, so she went to the MD.    Pertinent History currently on prednisone due to rash on her arm   How long can you stand comfortably? no limitations with CAM boot   Diagnostic tests CT end of month if no better.    Patient Stated Goals get the ankle strong and return to exercise class.    Currently in Pain? No/denies  in the ankle due to neuropathies            [facility] PT Assessment - 06/01/17 0001      Assessment   Medical Diagnosis Lt peroneal tendon sprain   Referring Provider Dr [name]   Onset Date/Surgical Date 04/01/17   Hand Dominance Right   Next MD Visit 06/15/17    Prior Therapy not for this     Precautions   Precautions --  no zumba or walking for exercise     Balance Screen   Has the patient fallen in the past 6 months No     [place] residence   Additional Comments limited on stairs due to boot     Observation/Other Assessments   Focus on Therapeutic Outcomes (FOTO)  53% limited      Observation/Other Assessments-Edema    Edema --  (+) edema in Lt ankle     Functional Tests   Functional tests Squat;Single leg stance;Step down     Squat   Comments WNL     Step Down   Comments some difficulty bilat eccentric quads.      Single Leg Stance   Comments Rt 11 sec, Lt 7 sec, 14 sec     ROM / Strength   AROM / PROM / Strength AROM;Strength;PROM     AROM   AROM Assessment Site Ankle   Right/Left Ankle Left;Right   Right Ankle Dorsiflexion 5   Right Ankle Plantar Flexion 47   Right Ankle Inversion 38   Right Ankle Eversion 42  Left Ankle Dorsiflexion 2   Left Ankle Plantar Flexion 62   Left Ankle Inversion 32   Left Ankle Eversion 22     PROM   PROM Assessment Site Ankle   Right/Left Ankle Left;Right   Right Ankle Dorsiflexion 10   Left Ankle Dorsiflexion 5   Left Ankle Eversion 32     Strength   Strength Assessment Site Hip;Knee;Ankle   Right/Left Hip --  WNL, except Rt hip ext 4-5 due to back pain   Right/Left Knee --  bilat WNL   Right/Left Ankle Left  Rt WNL   Left Ankle Dorsiflexion 4+/5   Left Ankle Plantar Flexion 4-/5  pain lateral foot   Left Ankle Inversion 5/5   Left Ankle Eversion 4-/5     Palpation   Palpation comment tenderness in Lt peroneal muscles, hypomobile 4th/5th Lt metatarsals and talus, palpable edema around lateral malleoli            Objective measurements completed on examination: See above findings.          [place] Adult PT Treatment/Exercise - 06/01/17 0001      Exercises   Exercises Ankle     Modalities   Modalities Electrical  Stimulation;Vasopneumatic     Acupuncturist Location Lt ankle   Electrical Stimulation Action ion repelling   Electrical Stimulation Parameters to tolerance   Electrical Stimulation Goals Edema     Vasopneumatic   Number Minutes Vasopneumatic  15 minutes   Vasopnuematic Location  Ankle   Vasopneumatic Pressure Low   Vasopneumatic Temperature  3*     Ankle Exercises: Stretches   Soleus Stretch --  45sec   Gastroc Stretch --  45sec     Ankle Exercises: Standing   SLS 3x10 Lt, toes taps FWD, side, BWD     Ankle Exercises: Seated   Other Seated Ankle Exercises 3x10 ER with red band                 PT Education - 06/01/17 1139    Education provided Yes   Education Details HEP   Person(s) Educated Patient   Methods Explanation;Demonstration;Handout   Comprehension Returned demonstration;Verbalized understanding             PT Long Term Goals - 06/01/17 1213      PT LONG TERM GOAL #1   Title I with advanced HEP to include return to zumba class ( 07/13/17)    Time 6   Period Weeks   Status New     PT LONG TERM GOAL #2   Title improve bilat ankle DF =/> 15 degrees to allow for proper stance phase ( 07/13/17)    Time 6   Period Weeks   Status New     PT LONG TERM GOAL #3   Title demo Lt ankle strength =/> 5-/5 to allow patient to report no turning of the ankle for =/> 1 wk ( 07/13/17)    Time 6   Period Weeks     PT LONG TERM GOAL #4   Title perform SLS Lt =/> 20 sec ( 07/13/17)    Time 6   Period Weeks   Status New     PT LONG TERM GOAL #5   Title Improve FOTO  to </= 35% limitation 07/13/17   Time 6   Period Weeks   Status New                Plan - 06/01/17 1209  Clinical Impression Statement Meila presents with ~ 2 month h/o Lt ankle dysfunction after turning it.  She has neuropathy and doesn't have full sensation in her foot and hasn't been able to feel a lot of pain.  She has been having issues with  edema and multiple sprains since then.  She has stiffness in her ankle joint, weakness, edema and impaired proprioception   History and Personal Factors relevant to plan of care: back surgery - on disability, obesity    Clinical Presentation Stable   Clinical Decision Making Low   Rehab Potential Good   PT Frequency 2x / week   PT Duration 6 weeks   PT Treatment/Interventions Patient/family education;Neuromuscular re-education;ADLs/Self Care Home Management;Cryotherapy;Electrical Stimulation;Iontophoresis 4mg /ml Dexamethasone;Moist Heat;Ultrasound;Dry needling;Manual techniques;Therapeutic activities;Therapeutic exercise;Taping;Vasopneumatic Device   PT Next Visit Plan progress proprioception and manual work - mobs Lt metatarsals and talus   Consulted and Agree with Plan of Care Patient      Patient will benefit from skilled therapeutic intervention in order to improve the following deficits and impairments:  Decreased range of motion, Difficulty walking, Pain, Obesity, Hypomobility, Decreased strength  Visit Diagnosis: Pain in left ankle and joints of left foot - Plan: PT plan of care cert/re-cert  Abnormal posture - Plan: PT plan of care cert/re-cert  Other symptoms and signs involving the musculoskeletal system - Plan: PT plan of care cert/re-cert  Muscle weakness (generalized) - Plan: PT plan of care cert/re-cert  Stiffness of left ankle, not elsewhere classified - Plan: PT plan of care cert/re-cert     Problem List Patient Active Problem List   Diagnosis Date Noted  . Strain of peroneal tendon, left, initial encounter 05/18/2017  . Poison ivy dermatitis 05/18/2017  . Influenza-like illness 11/12/2016  . Cervical lymphadenopathy 10/29/2016  . Nevus 06/18/2016  . Dark stools 10/31/2015  . Essential hypertension, benign 03/07/2015  . Carpal tunnel syndrome, bilateral 11/29/2014  . Carotid artery dissection ([place]) 04/26/2014  . Cervical spondylosis without myelopathy  04/13/2014  . Perennial allergic rhinitis 11/16/2013  . Spotting 11/16/2013  . Skin tag 11/16/2013  . Obsessive compulsive disorder 10/10/2013  . Hypothyroidism 10/10/2013  . Obesity 10/10/2013  . Annual physical exam 10/10/2013  . Lumbar degenerative disc disease 10/10/2013  . Perimenopausal 10/10/2013    Jeral Pinch PT  06/01/2017, 12:18 PM  [facility] [place], [place], [address] Phone: [phone]   Fax:  [phone]  Name: Eloyce Bultman MRN: [id number] Date of Birth: [birth year]

## 2017-06-03 ENCOUNTER — Ambulatory Visit (INDEPENDENT_AMBULATORY_CARE_PROVIDER_SITE_OTHER): Payer: Managed Care, Other (non HMO) | Admitting: Sports Medicine

## 2017-06-03 ENCOUNTER — Encounter: Payer: Self-pay | Admitting: Sports Medicine

## 2017-06-03 DIAGNOSIS — Z0000 Encounter for general adult medical examination without abnormal findings: Secondary | ICD-10-CM

## 2017-06-03 NOTE — Progress Notes (Signed)
  Subjective:    CC: Biometric screening  HPI: Preventive measures:  Up-to-date except for colon cancer screening, she just turned 27 I also filled out her biometric screening form.  Past medical history:  Negative.  See flowsheet/record as well for more information.  Surgical history: Negative.  See flowsheet/record as well for more information.  Family history: Negative.  See flowsheet/record as well for more information.  Social history: Negative.  See flowsheet/record as well for more information.  Allergies, and medications have been entered into the medical record, reviewed, and no changes needed.   Review of Systems: No fevers, chills, night sweats, weight loss, chest pain, or shortness of breath.   Objective:    General: Well Developed, well nourished, and in no acute distress.  Neuro: Alert and oriented x3, extra-ocular muscles intact, sensation grossly intact.  HEENT: Normocephalic, atraumatic, pupils equal round reactive to light, neck supple, no masses, no lymphadenopathy, thyroid nonpalpable.  Skin: Warm and dry, no rashes. Cardiac: Regular rate and rhythm, no murmurs rubs or gallops, no lower extremity edema.  Respiratory: Clear to auscultation bilaterally. Not using accessory muscles, speaking in full sentences.  Impression and Recommendations:    Annual physical exam Form filled out for biometric screening today. Due for colon cancer screening. ColoGuard testing ordered.

## 2017-06-03 NOTE — Assessment & Plan Note (Signed)
Form filled out for biometric screening today. Due for colon cancer screening. ColoGuard testing ordered.

## 2017-06-04 ENCOUNTER — Ambulatory Visit (INDEPENDENT_AMBULATORY_CARE_PROVIDER_SITE_OTHER): Payer: Managed Care, Other (non HMO) | Admitting: Physical Therapy

## 2017-06-04 DIAGNOSIS — M25672 Stiffness of left ankle, not elsewhere classified: Secondary | ICD-10-CM

## 2017-06-04 DIAGNOSIS — R293 Abnormal posture: Secondary | ICD-10-CM | POA: Diagnosis not present

## 2017-06-04 DIAGNOSIS — M6281 Muscle weakness (generalized): Secondary | ICD-10-CM

## 2017-06-04 DIAGNOSIS — R29898 Other symptoms and signs involving the musculoskeletal system: Secondary | ICD-10-CM

## 2017-06-04 DIAGNOSIS — M25572 Pain in left ankle and joints of left foot: Secondary | ICD-10-CM

## 2017-06-04 NOTE — Therapy (Signed)
[facility]  [place], [place], [address] Phone: [phone]   Fax:  [phone]  Physical Therapy Treatment  Patient Details  Name: Kelsey Snow MRN: [id number] Date of Birth: [birth year] Referring Provider: Dr. [name]  Encounter Date: 06/04/2017      PT End of Session - 06/04/17 1406    Number of Visits 12   Date for PT Re-Evaluation 07/13/17   PT Start Time 7867   PT Stop Time 1418   PT Time Calculation (min) 45 min      Past Medical History:  Diagnosis Date  . Anemia   . Essential hypertension, benign 03/07/2015  . GERD (gastroesophageal reflux disease)   . Hx of ectopic pregnancy   . Hyperlipidemia   . Lumbosacral spondylolysis   . Obese   . OCD (obsessive compulsive disorder)   . Thyroid disease     Past Surgical History:  Procedure Laterality Date  . BACK SURGERY    . ECTOPIC PREGNANCY SURGERY    . TUBAL LIGATION    . TUBAL LIGATION      There were no vitals filed for this visit.      Subjective Assessment - 06/04/17 1338    Subjective Pt reports she hasn't worn her boot in 2 days.  She hasn't done HEP since last session.  she is wearing a small compression band around Lt ankle for "support".    Patient Stated Goals get the ankle strong and return to exercise class.    Currently in Pain? No/denies  in ankle due to neuropathy            [facility] PT Assessment - 06/04/17 0001      Assessment   Medical Diagnosis Lt peroneal tendon sprain   Referring Provider Dr. [name]   Onset Date/Surgical Date 04/01/17   Hand Dominance Right   Next MD Visit 06/15/17          [facility] Adult PT Treatment/Exercise - 06/04/17 0001      Electrical Stimulation   Electrical Stimulation Location Lt ankle   Electrical Stimulation Action ion repelling    Electrical Stimulation Parameters to tolerance      Vasopneumatic   Number Minutes Vasopneumatic  15 minutes   Vasopnuematic  Location  Ankle   Vasopneumatic Pressure Low   Vasopneumatic Temperature  3*     Manual Therapy   Manual Therapy Taping   Kinesiotex Edema     Kinesiotix   Edema squid shaped piece of [place] tape applied to Lt lateral ankle to decrease edema.      Ankle Exercises: Aerobic   Stationary Bike NuStep L5: 5 min      Ankle Exercises: Stretches   Soleus Stretch 2 reps;30 seconds  each leg, holding on to wall   Gastroc Stretch 2 reps;30 seconds     Ankle Exercises: Standing   SLS 3x10 Lt, toes taps FWD, side, BWD   Heel Raises 10 reps  to tolerance, 2 sets, VC for weight towards great toe     Ankle Exercises: Seated   Other Seated Ankle Exercises Lt ankle eversion with red band x 10 reps, 3 sets           PT Long Term Goals - 06/04/17 1522      PT LONG TERM GOAL #1   Title I with advanced HEP to include return to zumba class ( 07/13/17)    Time 6   Period Weeks   Status On-going  PT LONG TERM GOAL #2   Title improve bilat ankle DF =/> 15 degrees to allow for proper stance phase ( 07/13/17)    Time 6   Period Weeks   Status On-going     PT LONG TERM GOAL #3   Title demo Lt ankle strength =/> 5-/5 to allow patient to report no turning of the ankle for =/> 1 wk ( 07/13/17)    Time 6   Period Weeks   Status On-going     PT LONG TERM GOAL #4   Title perform SLS Lt =/> 20 sec ( 07/13/17)    Time 6   Period Weeks   Status On-going     PT LONG TERM GOAL #5   Title Improve FOTO  to </= 35% limitation 07/13/17   Time 6   Period Weeks   Status On-going               Plan - 06/04/17 1519    Clinical Impression Statement Pt tolerated all exercises without increase in symptoms.  Encouraged pt to comply with HEP and MD's recommended boot use to assist in recovery from injruy. Pt will continue to benefit from continued PT intervention to maximize functional mobility and safety.    Rehab Potential Good   PT Frequency 2x / week   PT Duration 6 weeks   PT  Treatment/Interventions Patient/family education;Neuromuscular re-education;ADLs/Self Care Home Management;Cryotherapy;Electrical Stimulation;Iontophoresis 4mg /ml Dexamethasone;Moist Heat;Ultrasound;Dry needling;Manual techniques;Therapeutic activities;Therapeutic exercise;Taping;Vasopneumatic Device   PT Next Visit Plan progress proprioception and manual work - mobs Lt metatarsals and talus   Consulted and Agree with Plan of Care Patient      Patient will benefit from skilled therapeutic intervention in order to improve the following deficits and impairments:  Decreased range of motion, Difficulty walking, Pain, Obesity, Hypomobility, Decreased strength  Visit Diagnosis: Pain in left ankle and joints of left foot  Abnormal posture  Other symptoms and signs involving the musculoskeletal system  Stiffness of left ankle, not elsewhere classified  Muscle weakness (generalized)     Problem List Patient Active Problem List   Diagnosis Date Noted  . Strain of peroneal tendon, left, initial encounter 05/18/2017  . Poison ivy dermatitis 05/18/2017  . Influenza-like illness 11/12/2016  . Cervical lymphadenopathy 10/29/2016  . Nevus 06/18/2016  . Dark stools 10/31/2015  . Essential hypertension, benign 03/07/2015  . Carpal tunnel syndrome, bilateral 11/29/2014  . Carotid artery dissection ([place]) 04/26/2014  . Cervical spondylosis without myelopathy 04/13/2014  . Perennial allergic rhinitis 11/16/2013  . Spotting 11/16/2013  . Skin tag 11/16/2013  . Obsessive compulsive disorder 10/10/2013  . Hypothyroidism 10/10/2013  . Obesity 10/10/2013  . Annual physical exam 10/10/2013  . Lumbar degenerative disc disease 10/10/2013  . Perimenopausal 10/10/2013   Kerin Perna, PTA 06/04/17 3:23 PM  [place], [place], [address] Phone: [phone]   Fax:  [phone]  Name: Kelsey Snow MRN:  [id number] Date of Birth: 05-27-1966

## 2017-06-07 ENCOUNTER — Ambulatory Visit (INDEPENDENT_AMBULATORY_CARE_PROVIDER_SITE_OTHER): Payer: Managed Care, Other (non HMO) | Admitting: Physical Therapy

## 2017-06-07 DIAGNOSIS — R29898 Other symptoms and signs involving the musculoskeletal system: Secondary | ICD-10-CM

## 2017-06-07 DIAGNOSIS — M25572 Pain in left ankle and joints of left foot: Secondary | ICD-10-CM | POA: Diagnosis not present

## 2017-06-07 DIAGNOSIS — M6281 Muscle weakness (generalized): Secondary | ICD-10-CM

## 2017-06-07 DIAGNOSIS — M25672 Stiffness of left ankle, not elsewhere classified: Secondary | ICD-10-CM

## 2017-06-07 DIAGNOSIS — R293 Abnormal posture: Secondary | ICD-10-CM | POA: Diagnosis not present

## 2017-06-07 NOTE — Therapy (Signed)
[place], [place], [address] Phone: [phone]   Fax:  [phone]  Physical Therapy Treatment  Patient Details  Name: Kelsey Snow MRN: [id number] Date of Birth: 05/14/1966 Referring Provider: Dr. [name]  Encounter Date: 06/07/2017      PT End of Session - 06/07/17 1138    Visit Number 3   Number of Visits 12   Date for PT Re-Evaluation 07/13/17   PT Start Time 1059   PT Stop Time 1153   PT Time Calculation (min) 54 min   Activity Tolerance Patient tolerated treatment well   Behavior During Therapy [facility] for tasks assessed/performed      Past Medical History:  Diagnosis Date  . Anemia   . Essential hypertension, benign 03/07/2015  . GERD (gastroesophageal reflux disease)   . Hx of ectopic pregnancy   . Hyperlipidemia   . Lumbosacral spondylolysis   . Obese   . OCD (obsessive compulsive disorder)   . Thyroid disease     Past Surgical History:  Procedure Laterality Date  . BACK SURGERY    . ECTOPIC PREGNANCY SURGERY    . TUBAL LIGATION    . TUBAL LIGATION      There were no vitals filed for this visit.      Subjective Assessment - 06/07/17 1102    Subjective doing well; didn't bring a shoe today but is wearing boot.   Pertinent History currently on prednisone due to rash on her arm   Patient Stated Goals get the ankle strong and return to exercise class.    Currently in Pain? No/denies                         [facility] Adult PT Treatment/Exercise - 06/07/17 1103      Modalities   Modalities Electrical Stimulation;Vasopneumatic     Acupuncturist Location Lt ankle   Electrical Stimulation Action IFC   Electrical Stimulation Parameters to tolerance   Electrical Stimulation Goals Edema     Vasopneumatic   Number Minutes Vasopneumatic  15 minutes   Vasopnuematic Location  Ankle   Vasopneumatic Pressure Low   Vasopneumatic Temperature  3*     Ankle Exercises: Aerobic   Stationary Bike NuStep L5: 6 min      Ankle Exercises: Stretches   Soleus Stretch 3 reps;30 seconds   Gastroc Stretch 30 seconds;3 reps     Ankle Exercises: Seated   Heel Raises 15 reps  2 sets; cues for pressure through 1st met head   Other Seated Ankle Exercises DF and EV with red theraband 2x15 reps each; Lt   Other Seated Ankle Exercises seated arch lifts; 5 sec hold; 2x15; Lt pronation 2x15 reps                     PT Long Term Goals - 06/04/17 1522      PT LONG TERM GOAL #1   Title I with advanced HEP to include return to zumba class ( 07/13/17)    Time 6   Period Weeks   Status On-going     PT LONG TERM GOAL #2   Title improve bilat ankle DF =/> 15 degrees to allow for proper stance phase ( 07/13/17)    Time 6   Period Weeks   Status On-going     PT LONG TERM GOAL #3   Title demo Lt ankle strength =/> 5-/5  to allow patient to report no turning of the ankle for =/> 1 wk ( 07/13/17)    Time 6   Period Weeks   Status On-going     PT LONG TERM GOAL #4   Title perform SLS Lt =/> 20 sec ( 07/13/17)    Time 6   Period Weeks   Status On-going     PT LONG TERM GOAL #5   Title Improve FOTO  to </= 35% limitation 07/13/17   Time 6   Period Weeks   Status On-going               Plan - 06/07/17 1138    Clinical Impression Statement Pt arrived today with only her boot for LLE and no additional shoe, so most of session performed seated except for stretches.  Advised pt to bring tennis shoe and wear tennis shoe on RLE to next sessions.  Will continue to benefit from PT to maximize function.   PT Treatment/Interventions Patient/family education;Neuromuscular re-education;ADLs/Self Care Home Management;Cryotherapy;Electrical Stimulation;Iontophoresis 76m/ml Dexamethasone;Moist Heat;Ultrasound;Dry needling;Manual techniques;Therapeutic activities;Therapeutic exercise;Taping;Vasopneumatic Device   PT  Next Visit Plan progress proprioception and manual work - mobs Lt metatarsals and talus   Consulted and Agree with Plan of Care Patient      Patient will benefit from skilled therapeutic intervention in order to improve the following deficits and impairments:  Decreased range of motion, Difficulty walking, Pain, Obesity, Hypomobility, Decreased strength  Visit Diagnosis: Pain in left ankle and joints of left foot  Abnormal posture  Other symptoms and signs involving the musculoskeletal system  Stiffness of left ankle, not elsewhere classified  Muscle weakness (generalized)     Problem List Patient Active Problem List   Diagnosis Date Noted  . Strain of peroneal tendon, left, initial encounter 05/18/2017  . Poison ivy dermatitis 05/18/2017  . Influenza-like illness 11/12/2016  . Cervical lymphadenopathy 10/29/2016  . Nevus 06/18/2016  . Dark stools 10/31/2015  . Essential hypertension, benign 03/07/2015  . Carpal tunnel syndrome, bilateral 11/29/2014  . Carotid artery dissection ([place] 04/26/2014  . Cervical spondylosis without myelopathy 04/13/2014  . Perennial allergic rhinitis 11/16/2013  . Spotting 11/16/2013  . Skin tag 11/16/2013  . Obsessive compulsive disorder 10/10/2013  . Hypothyroidism 10/10/2013  . Obesity 10/10/2013  . Annual physical exam 10/10/2013  . Lumbar degenerative disc disease 10/10/2013  . Perimenopausal 10/10/2013       SLaureen Abrahams PT, DPT 06/07/17 11:40 AM    [facility] [address]Phone: [phone]  Fax:  [phone] Name: Kelsey BoltzMRN: [id number] of Birth: 8June 10, 1967

## 2017-06-09 ENCOUNTER — Ambulatory Visit (INDEPENDENT_AMBULATORY_CARE_PROVIDER_SITE_OTHER): Payer: Managed Care, Other (non HMO) | Admitting: Physical Therapy

## 2017-06-09 DIAGNOSIS — M25672 Stiffness of left ankle, not elsewhere classified: Secondary | ICD-10-CM | POA: Diagnosis not present

## 2017-06-09 DIAGNOSIS — M25572 Pain in left ankle and joints of left foot: Secondary | ICD-10-CM

## 2017-06-09 DIAGNOSIS — R293 Abnormal posture: Secondary | ICD-10-CM | POA: Diagnosis not present

## 2017-06-09 DIAGNOSIS — R29898 Other symptoms and signs involving the musculoskeletal system: Secondary | ICD-10-CM | POA: Diagnosis not present

## 2017-06-09 LAB — COLOGUARD: Cologuard: NEGATIVE

## 2017-06-09 NOTE — Therapy (Signed)
[place], [place], [address] Phone: [phone]   Fax:  [phone]  Physical Therapy Treatment  Patient Details  Name: Kelsey Snow MRN: [id number] Date of Birth: 1966/03/05 Referring Provider: Dr. [name]  Encounter Date: 06/09/2017      PT End of Session - 06/09/17 1115    Visit Number 4   Number of Visits 12   Date for PT Re-Evaluation 07/13/17   PT Start Time 1104   PT Stop Time 7915   PT Time Calculation (min) 53 min   Activity Tolerance Patient tolerated treatment well;No increased pain   Behavior During Therapy WFL for tasks assessed/performed      Past Medical History:  Diagnosis Date  . Anemia   . Essential hypertension, benign 03/07/2015  . GERD (gastroesophageal reflux disease)   . Hx of ectopic pregnancy   . Hyperlipidemia   . Lumbosacral spondylolysis   . Obese   . OCD (obsessive compulsive disorder)   . Thyroid disease     Past Surgical History:  Procedure Laterality Date  . BACK SURGERY    . ECTOPIC PREGNANCY SURGERY    . TUBAL LIGATION    . TUBAL LIGATION      There were no vitals filed for this visit.      Subjective Assessment - 06/09/17 1115    Subjective Pt reports her Lt ankle remains swollen, otherwise no new changes.  She wears boot occasionally(" 40% of the time"), mostly wears it if she is going to be on feet a lot.    Patient Stated Goals get the ankle strong and return to exercise class.    Currently in Pain? No/denies   Pain Score 0            [facility] PT Assessment - 06/09/17 0001      Assessment   Medical Diagnosis Lt peroneal tendon sprain   Referring Provider Dr. [name]   Onset Date/Surgical Date 04/01/17   Hand Dominance Right   Next MD Visit 06/15/17     Single Leg Stance   Comments Rt 20 sec; Lt 8 sec, 15 sec, 16 sec      AROM   Right Ankle Dorsiflexion 8   Left Ankle Dorsiflexion 4          [facility] Adult PT  Treatment/Exercise - 06/09/17 0001      Vasopneumatic   Number Minutes Vasopneumatic  15 minutes   Vasopnuematic Location  Ankle   Vasopneumatic Pressure Low   Vasopneumatic Temperature  34 deg     Ankle Exercises: Aerobic   Stationary Bike NuStep L5: 6 min      Ankle Exercises: Stretches   Soleus Stretch 3 reps;30 seconds   Gastroc Stretch 30 seconds;3 reps     Ankle Exercises: Seated   Heel Raises 15 reps  2 sets; cues for pressure through 1st met head   Other Seated Ankle Exercises Lt ankle eversion with green band x 15 reps, 2 sets   Other Seated Ankle Exercises seated arch lifts; 5 sec hold; 2x15; Lt pronation 2x15 reps     Ankle Exercises: Standing   SLS Lt SLS with forward leans x 10 reps.; Rt SLS x 1 rep; Lt SLS x 5 rep                      PT Long Term Goals - 06/09/17 1128      PT LONG TERM GOAL #1  Title I with advanced HEP to include return to zumba class ( 07/13/17)    Time 6   Period Weeks   Status On-going     PT LONG TERM GOAL #2   Title improve bilat ankle DF =/> 15 degrees to allow for proper stance phase ( 07/13/17)    Time 6   Period Weeks   Status On-going     PT LONG TERM GOAL #3   Title demo Lt ankle strength =/> 5-/5 to allow patient to report no turning of the ankle for =/> 1 wk ( 07/13/17)    Time 6   Period Weeks   Status On-going     PT LONG TERM GOAL #4   Title perform SLS Lt =/> 20 sec ( 07/13/17)    Time 6   Period Weeks   Status On-going     PT LONG TERM GOAL #5   Title Improve FOTO  to </= 35% limitation 07/13/17   Time 6   Period Weeks   Status On-going               Plan - 06/09/17 1134    Clinical Impression Statement Pt demonstrated improved DF bilat. SLS time is improving. She tolerated increased resistance for Lt ankle eversion without increase in symptoms.  Pt progressing towards goals.    Rehab Potential Good   PT Frequency 2x / week   PT Duration 6 weeks   PT Treatment/Interventions  Patient/family education;Neuromuscular re-education;ADLs/Self Care Home Management;Cryotherapy;Electrical Stimulation;Iontophoresis 52m/ml Dexamethasone;Moist Heat;Ultrasound;Dry needling;Manual techniques;Therapeutic activities;Therapeutic exercise;Taping;Vasopneumatic Device   PT Next Visit Plan progress proprioception; manual work - mobs Lt metatarsals and talus   Consulted and Agree with Plan of Care Patient      Patient will benefit from skilled therapeutic intervention in order to improve the following deficits and impairments:  Decreased range of motion, Difficulty walking, Pain, Obesity, Hypomobility, Decreased strength  Visit Diagnosis: Pain in left ankle and joints of left foot  Abnormal posture  Other symptoms and signs involving the musculoskeletal system  Stiffness of left ankle, not elsewhere classified     Problem List Patient Active Problem List   Diagnosis Date Noted  . Strain of peroneal tendon, left, initial encounter 05/18/2017  . Poison ivy dermatitis 05/18/2017  . Influenza-like illness 11/12/2016  . Cervical lymphadenopathy 10/29/2016  . Nevus 06/18/2016  . Dark stools 10/31/2015  . Essential hypertension, benign 03/07/2015  . Carpal tunnel syndrome, bilateral 11/29/2014  . Carotid artery dissection ([place] 04/26/2014  . Cervical spondylosis without myelopathy 04/13/2014  . Perennial allergic rhinitis 11/16/2013  . Spotting 11/16/2013  . Skin tag 11/16/2013  . Obsessive compulsive disorder 10/10/2013  . Hypothyroidism 10/10/2013  . Obesity 10/10/2013  . Annual physical exam 10/10/2013  . Lumbar degenerative disc disease 10/10/2013  . Perimenopausal 10/10/2013   JKerin Perna PTA 06/09/17 11:46 AM  [facility] [address]Phone: [phone]  Fax:  [phone] Name: TKathlen SakuraiMRN: [id number] of Birth: 8Oct 03, 1967

## 2017-06-14 ENCOUNTER — Ambulatory Visit (INDEPENDENT_AMBULATORY_CARE_PROVIDER_SITE_OTHER): Payer: Managed Care, Other (non HMO) | Admitting: Physical Therapy

## 2017-06-14 DIAGNOSIS — M25572 Pain in left ankle and joints of left foot: Secondary | ICD-10-CM

## 2017-06-14 DIAGNOSIS — R29898 Other symptoms and signs involving the musculoskeletal system: Secondary | ICD-10-CM | POA: Diagnosis not present

## 2017-06-14 DIAGNOSIS — M6281 Muscle weakness (generalized): Secondary | ICD-10-CM

## 2017-06-14 DIAGNOSIS — M25672 Stiffness of left ankle, not elsewhere classified: Secondary | ICD-10-CM

## 2017-06-14 DIAGNOSIS — R293 Abnormal posture: Secondary | ICD-10-CM

## 2017-06-14 DIAGNOSIS — M961 Postlaminectomy syndrome, not elsewhere classified: Secondary | ICD-10-CM | POA: Diagnosis not present

## 2017-06-14 DIAGNOSIS — M791 Myalgia: Secondary | ICD-10-CM | POA: Diagnosis not present

## 2017-06-14 DIAGNOSIS — G894 Chronic pain syndrome: Secondary | ICD-10-CM | POA: Diagnosis not present

## 2017-06-14 DIAGNOSIS — M47812 Spondylosis without myelopathy or radiculopathy, cervical region: Secondary | ICD-10-CM | POA: Diagnosis not present

## 2017-06-14 DIAGNOSIS — M4692 Unspecified inflammatory spondylopathy, cervical region: Secondary | ICD-10-CM | POA: Diagnosis not present

## 2017-06-14 NOTE — Therapy (Addendum)
[place], [place], [address] Phone: [phone]   Fax:  [phone]  Physical Therapy Treatment  Patient Details  Name: Kelsey Snow MRN: [id number] Date of Birth: September 13, 1966 Referring Provider: Dr. [name]  Encounter Date: 06/14/2017      PT End of Session - 06/14/17 1140    Visit Number 5   Number of Visits 12   Date for PT Re-Evaluation 07/13/17   PT Start Time 1101   PT Stop Time 1145   PT Time Calculation (min) 44 min   Activity Tolerance Patient tolerated treatment well;Patient limited by pain   Behavior During Therapy [facility] for tasks assessed/performed      Past Medical History:  Diagnosis Date  . Anemia   . Essential hypertension, benign 03/07/2015  . GERD (gastroesophageal reflux disease)   . Hx of ectopic pregnancy   . Hyperlipidemia   . Lumbosacral spondylolysis   . Obese   . OCD (obsessive compulsive disorder)   . Thyroid disease     Past Surgical History:  Procedure Laterality Date  . BACK SURGERY    . ECTOPIC PREGNANCY SURGERY    . TUBAL LIGATION    . TUBAL LIGATION      There were no vitals filed for this visit.      Subjective Assessment - 06/14/17 1103    Subjective feels like Lt ankle is getting worse and swelling.  sees MD tomorrow and wants to hold on scheduling.   Patient Stated Goals get the ankle strong and return to exercise class.    Currently in Pain? No/denies            [facility] PT Assessment - 06/14/17 1116      Observation/Other Assessments   Focus on Therapeutic Outcomes (FOTO)  49 (51% limited)     Single Leg Stance   Comments Lt: 9 sec avg x 4-5 attempts; RLE not touching LLE      AROM   Left Ankle Dorsiflexion 4  18 in stance     PROM   Left Ankle Dorsiflexion 5     Strength   Left Ankle Dorsiflexion 4+/5   Left Ankle Inversion 4/5   Left Ankle Eversion 4-/5                     [facility] Adult PT  Treatment/Exercise - 06/14/17 1108      Vasopneumatic   Number Minutes Vasopneumatic  15 minutes   Vasopnuematic Location  Ankle   Vasopneumatic Pressure Low   Vasopneumatic Temperature  34 deg     Ankle Exercises: Aerobic   Stationary Bike NuStep L5: 6 min      Ankle Exercises: Stretches   Soleus Stretch 3 reps;30 seconds   Gastroc Stretch 30 seconds;3 reps     Ankle Exercises: Seated   Heel Raises 20 reps                     PT Long Term Goals - 06/14/17 1140      PT LONG TERM GOAL #1   Title I with advanced HEP to include return to zumba class ( 07/13/17)    Status On-going     PT LONG TERM GOAL #2   Title improve bilat ankle DF =/> 15 degrees to allow for proper stance phase ( 07/13/17)    Baseline 06/14/17: met in standing; not met unweighted   Status Partially Met     PT LONG  TERM GOAL #3   Title demo Lt ankle strength =/> 5-/5 to allow patient to report no turning of the ankle for =/> 1 wk ( 07/13/17)    Baseline 06/14/17: see flowsheet   Status On-going     PT LONG TERM GOAL #4   Title perform SLS Lt =/> 20 sec ( 07/13/17)    Baseline 06/14/17: 9 sec without support of RLE   Status On-going     PT LONG TERM GOAL #5   Title Improve FOTO  to </= 35% limitation 07/13/17   Baseline 06/14/17: 51% limited   Status On-going               Plan - 06/14/17 1141    Clinical Impression Statement Pt arrived today with reports of increasing pain and worsening symptoms.  Pt has follow up with MD tomorrow and wants to hold PT until after she sees MD.  Session today mostly focused on goal assessment as well as additional exercises for strengthening and ROM.  Pt hasn't met any goals at this time.  Will hold PT until after MD appt.   PT Treatment/Interventions Patient/family education;Neuromuscular re-education;ADLs/Self Care Home Management;Cryotherapy;Electrical Stimulation;Iontophoresis 58m/ml Dexamethasone;Moist Heat;Ultrasound;Dry needling;Manual  techniques;Therapeutic activities;Therapeutic exercise;Taping;Vasopneumatic Device   PT Next Visit Plan follow up after MD appt; continue per POC if indicated   Consulted and Agree with Plan of Care Patient      Patient will benefit from skilled therapeutic intervention in order to improve the following deficits and impairments:  Decreased range of motion, Difficulty walking, Pain, Obesity, Hypomobility, Decreased strength  Visit Diagnosis: Pain in left ankle and joints of left foot  Abnormal posture  Other symptoms and signs involving the musculoskeletal system  Stiffness of left ankle, not elsewhere classified  Muscle weakness (generalized)     Problem List Patient Active Problem List   Diagnosis Date Noted  . Strain of peroneal tendon, left, initial encounter 05/18/2017  . Poison ivy dermatitis 05/18/2017  . Influenza-like illness 11/12/2016  . Cervical lymphadenopathy 10/29/2016  . Nevus 06/18/2016  . Dark stools 10/31/2015  . Essential hypertension, benign 03/07/2015  . Carpal tunnel syndrome, bilateral 11/29/2014  . Carotid artery dissection ([place] 04/26/2014  . Cervical spondylosis without myelopathy 04/13/2014  . Perennial allergic rhinitis 11/16/2013  . Spotting 11/16/2013  . Skin tag 11/16/2013  . Obsessive compulsive disorder 10/10/2013  . Hypothyroidism 10/10/2013  . Obesity 10/10/2013  . Annual physical exam 10/10/2013  . Lumbar degenerative disc disease 10/10/2013  . Perimenopausal 10/10/2013      SLaureen Abrahams PT, DPT 06/14/17 11:44 AM    [facility] [address]Phone: [phone]  Fax:  [phone] Name: Kelsey EddsMRN: [id number] of Birth: [birth year]  PHYSICAL THERAPY DISCHARGE SUMMARY  Visits from Start of Care: 5  Current functional level related to goals / functional outcomes: unknown   Remaining deficits: unknown    Education / Equipment: HEP Plan: Patient agrees to discharge.  Patient goals were partially met. Patient is being discharged due to not returning since the last visit.  ?????Pt was placed on hold until after MD appointment and is she needed to return she was to call and reschedule.   SJeral Pinch PT 07/05/17 10:32 AM

## 2017-06-15 ENCOUNTER — Ambulatory Visit (INDEPENDENT_AMBULATORY_CARE_PROVIDER_SITE_OTHER): Payer: Managed Care, Other (non HMO) | Admitting: Sports Medicine

## 2017-06-15 ENCOUNTER — Encounter: Payer: Self-pay | Admitting: Sports Medicine

## 2017-06-15 DIAGNOSIS — E669 Obesity, unspecified: Secondary | ICD-10-CM

## 2017-06-15 DIAGNOSIS — S86312A Strain of muscle(s) and tendon(s) of peroneal muscle group at lower leg level, left leg, initial encounter: Secondary | ICD-10-CM

## 2017-06-15 DIAGNOSIS — Z23 Encounter for immunization: Secondary | ICD-10-CM | POA: Diagnosis not present

## 2017-06-15 NOTE — Assessment & Plan Note (Signed)
Patient has really been noncompliant with her CAM boot. She will increase her compliance, and we will continue it for another month. She has developed some pain over the dorsum of her metatarsals consistent with stress injuries, the boot should help this as well. If she has persistent pain at the follow-up visit I will place a cast for forced compliance.

## 2017-06-15 NOTE — Assessment & Plan Note (Signed)
Patient agrees to lose 10 pounds by the next visit, if not we will do a referral to bariatric surgery.

## 2017-06-15 NOTE — Progress Notes (Signed)
  Subjective:    CC: Follow-up  HPI: Left foot pain: Initially suspected to be a peroneal tendinopathy, now has developed pain over the dorsum of her forefoot, really not wearing her boot. Moderate, worsening without radiation.  Obesity: Would like a month to lose some weight, and if she doesn't lose the weight we will do a referral to bariatric surgery.  Past medical history:  Negative.  See flowsheet/record as well for more information.  Surgical history: Negative.  See flowsheet/record as well for more information.  Family history: Negative.  See flowsheet/record as well for more information.  Social history: Negative.  See flowsheet/record as well for more information.  Allergies, and medications have been entered into the medical record, reviewed, and no changes needed.   Review of Systems: No fevers, chills, night sweats, weight loss, chest pain, or shortness of breath.   Objective:    General: Well Developed, well nourished, and in no acute distress.  Neuro: Alert and oriented x3, extra-ocular muscles intact, sensation grossly intact.  HEENT: Normocephalic, atraumatic, pupils equal round reactive to light, neck supple, no masses, no lymphadenopathy, thyroid nonpalpable.  Skin: Warm and dry, no rashes. Cardiac: Regular rate and rhythm, no murmurs rubs or gallops, no lower extremity edema.  Respiratory: Clear to auscultation bilaterally. Not using accessory muscles, speaking in full sentences. Left Foot: Mild swelling Range of motion is full in all directions. Strength is 5/5 in all directions. No hallux valgus. No pes cavus or pes planus. No abnormal callus noted. No pain over the navicular prominence, or base of fifth metatarsal. No tenderness to palpation of the calcaneal insertion of plantar fascia. No pain at the Achilles insertion. No pain over the calcaneal bursa. Tender to palpation over the second through fourth metatarsal shaft dorsally No hallux rigidus or  limitus. No tenderness palpation over interphalangeal joints. No pain with compression of the metatarsal heads. Neurovascularly intact distally. Mild pain behind lateral malleolus  Impression and Recommendations:    Strain of peroneal tendon, left, initial encounter Patient has really been noncompliant with her CAM boot. She will increase her compliance, and we will continue it for another month. She has developed some pain over the dorsum of her metatarsals consistent with stress injuries, the boot should help this as well. If she has persistent pain at the follow-up visit I will place a cast for forced compliance.  Obesity Patient agrees to lose 10 pounds by the next visit, if not we will do a referral to bariatric surgery.  I spent 25 minutes with this patient, greater than 50% was face-to-face time counseling regarding the above diagnoses

## 2017-06-16 ENCOUNTER — Encounter: Payer: Self-pay | Admitting: Physical Therapy

## 2017-06-17 ENCOUNTER — Ambulatory Visit: Payer: Self-pay | Admitting: Sports Medicine

## 2017-06-20 ENCOUNTER — Other Ambulatory Visit: Payer: Self-pay | Admitting: Sports Medicine

## 2017-06-23 ENCOUNTER — Encounter: Payer: Self-pay | Admitting: Sports Medicine

## 2017-06-25 ENCOUNTER — Encounter: Payer: Self-pay | Admitting: Sports Medicine

## 2017-06-25 ENCOUNTER — Ambulatory Visit (INDEPENDENT_AMBULATORY_CARE_PROVIDER_SITE_OTHER): Payer: Managed Care, Other (non HMO) | Admitting: Sports Medicine

## 2017-06-25 DIAGNOSIS — G5603 Carpal tunnel syndrome, bilateral upper limbs: Secondary | ICD-10-CM | POA: Diagnosis not present

## 2017-06-25 NOTE — Progress Notes (Signed)
  Subjective:    CC: Carpal tunnel syndrome  HPI: 4 months ago injected her left and right carpal tunnels, having recurrence of symptoms on the right but not the left. Numbness and tingling into the first through fourth fingers on the right hand, worse at night, moderate, persistent. Desires repeat interventional treatment today.  Past medical history:  Negative.  See flowsheet/record as well for more information.  Surgical history: Negative.  See flowsheet/record as well for more information.  Family history: Negative.  See flowsheet/record as well for more information.  Social history: Negative.  See flowsheet/record as well for more information.  Allergies, and medications have been entered into the medical record, reviewed, and no changes needed.   Review of Systems: No fevers, chills, night sweats, weight loss, chest pain, or shortness of breath.   Objective:    General: Well Developed, well nourished, and in no acute distress.  Neuro: Alert and oriented x3, extra-ocular muscles intact, sensation grossly intact.  HEENT: Normocephalic, atraumatic, pupils equal round reactive to light, neck supple, no masses, no lymphadenopathy, thyroid nonpalpable.  Skin: Warm and dry, no rashes. Cardiac: Regular rate and rhythm, no murmurs rubs or gallops, no lower extremity edema.  Respiratory: Clear to auscultation bilaterally. Not using accessory muscles, speaking in full sentences.  Procedure: Real-time Ultrasound Guided right median nerve hydrodissection Device: GE Logiq E  Verbal informed consent obtained.  Time-out conducted.  Noted no overlying erythema, induration, or other signs of local infection.  Skin prepped in a sterile fashion.  Local anesthesia: Topical Ethyl chloride.  With sterile technique and under real time ultrasound guidance:  Using a 25-gauge needle advanced into the carpal tunnel, taking care to avoid intraneural injection at least medication superficial to and deep to  the median nerve, and further down around the flexor tendon sheath in the carpal tunnel or total of 1 mL kenalog 40, 4 mL lidocaine. Completed without difficulty  Pain immediately resolved suggesting accurate placement of the medication.  Advised to call if fevers/chills, erythema, induration, drainage, or persistent bleeding.  Images permanently stored and available for review in the ultrasound unit.  Impression: Technically successful ultrasound guided injection.  Impression and Recommendations:    Carpal tunnel syndrome, bilateral Right median nerve hydrodissection as above.  The previous one was bilateral and 4 months ago.  ___________________________________________ [name]. [name], M.D., ABFM., CAQSM. Primary Care and [place] Instructor of [place] [employer] [facility] [employer]

## 2017-06-25 NOTE — Assessment & Plan Note (Signed)
Right median nerve hydrodissection as above.  The previous one was bilateral and 4 months ago.

## 2017-06-29 ENCOUNTER — Other Ambulatory Visit: Payer: Self-pay | Admitting: Sports Medicine

## 2017-07-01 ENCOUNTER — Encounter: Payer: Self-pay | Admitting: Sports Medicine

## 2017-07-01 NOTE — Telephone Encounter (Signed)
Would you please look into this.  I don't recall getting these results.  Lets have them re-fax it.

## 2017-07-16 ENCOUNTER — Encounter: Payer: Self-pay | Admitting: Sports Medicine

## 2017-07-16 ENCOUNTER — Ambulatory Visit (INDEPENDENT_AMBULATORY_CARE_PROVIDER_SITE_OTHER): Payer: Managed Care, Other (non HMO) | Admitting: Sports Medicine

## 2017-07-16 DIAGNOSIS — S86312A Strain of muscle(s) and tendon(s) of peroneal muscle group at lower leg level, left leg, initial encounter: Secondary | ICD-10-CM | POA: Diagnosis not present

## 2017-07-16 DIAGNOSIS — E669 Obesity, unspecified: Secondary | ICD-10-CM | POA: Diagnosis not present

## 2017-07-16 NOTE — Assessment & Plan Note (Signed)
Good weight loss on her own. Keep up process going, if she does not continue I am going to send her to bariatric surgery.

## 2017-07-16 NOTE — Assessment & Plan Note (Signed)
Resolved after weeks of Boot immobilization.

## 2017-07-16 NOTE — Progress Notes (Signed)
  Subjective:    CC: follow-up  HPI: Obesity: Good weight loss on her own.  Left peroneal tendinitis: Resolved with a few weeks of boot immobilization, rehabilitation exercises.  Past medical history:  Negative.  See flowsheet/record as well for more information.  Surgical history: Negative.  See flowsheet/record as well for more information.  Family history: Negative.  See flowsheet/record as well for more information.  Social history: Negative.  See flowsheet/record as well for more information.  Allergies, and medications have been entered into the medical record, reviewed, and no changes needed.   Review of Systems: No fevers, chills, night sweats, weight loss, chest pain, or shortness of breath.   Objective:    General: Well Developed, well nourished, and in no acute distress.  Neuro: Alert and oriented x3, extra-ocular muscles intact, sensation grossly intact.  HEENT: Normocephalic, atraumatic, pupils equal round reactive to light, neck supple, no masses, no lymphadenopathy, thyroid nonpalpable.  Skin: Warm and dry, no rashes. Cardiac: Regular rate and rhythm, no murmurs rubs or gallops, no lower extremity edema.  Respiratory: Clear to auscultation bilaterally. Not using accessory muscles, speaking in full sentences.  Impression and Recommendations:    Obesity Good weight loss on her own. Keep up process going, if she does not continue I am going to send her to bariatric surgery.  Strain of peroneal tendon, left, initial encounter Resolved after weeks of Boot immobilization.  ___________________________________________ [name]. [name], M.D., ABFM., CAQSM. Primary Care and [place] Instructor of [place] [employer] [facility] [employer]

## 2017-07-21 ENCOUNTER — Encounter: Payer: Self-pay | Admitting: Sports Medicine

## 2017-08-09 ENCOUNTER — Other Ambulatory Visit: Payer: Self-pay | Admitting: Sports Medicine

## 2017-08-18 ENCOUNTER — Ambulatory Visit: Payer: Self-pay | Admitting: Sports Medicine

## 2017-09-19 ENCOUNTER — Other Ambulatory Visit: Payer: Self-pay | Admitting: Sports Medicine

## 2017-09-20 ENCOUNTER — Other Ambulatory Visit: Payer: Self-pay | Admitting: Sports Medicine

## 2017-10-05 ENCOUNTER — Encounter: Payer: Self-pay | Admitting: Sports Medicine

## 2017-10-05 ENCOUNTER — Ambulatory Visit (INDEPENDENT_AMBULATORY_CARE_PROVIDER_SITE_OTHER): Payer: Medicare Other | Admitting: Sports Medicine

## 2017-10-05 DIAGNOSIS — G5603 Carpal tunnel syndrome, bilateral upper limbs: Secondary | ICD-10-CM

## 2017-10-05 NOTE — Progress Notes (Signed)
Subjective:    CC: Bilateral hand numbness  HPI: This is a 51 year old female, she has known bilateral carpal tunnel syndrome, previous injections were 3 months ago.  Now having a recurrence of numbness and tingling in both hands, median nerve distribution, desires repeat median nerve hydrodissection.  Symptoms are moderate, persistent.  Present now for several weeks  Past medical history:  Negative.  See flowsheet/record as well for more information.  Surgical history: Negative.  See flowsheet/record as well for more information.  Family history: Negative.  See flowsheet/record as well for more information.  Social history: Negative.  See flowsheet/record as well for more information.  Allergies, and medications have been entered into the medical record, reviewed, and no changes needed.   (To billers/coders, pertinent past medical, social, surgical, family history can be found in problem list, if problem list is marked as reviewed then this indicates that past medical, social, surgical, family history was also reviewed)  Review of Systems: No fevers, chills, night sweats, weight loss, chest pain, or shortness of breath.   Objective:    General: Well Developed, well nourished, and in no acute distress.  Neuro: Alert and oriented x3, extra-ocular muscles intact, sensation grossly intact.  HEENT: Normocephalic, atraumatic, pupils equal round reactive to light, neck supple, no masses, no lymphadenopathy, thyroid nonpalpable.  Skin: Warm and dry, no rashes. Cardiac: Regular rate and rhythm, no murmurs rubs or gallops, no lower extremity edema.  Respiratory: Clear to auscultation bilaterally. Not using accessory muscles, speaking in full sentences. Bilateral wrists: Inspection normal with no visible erythema or swelling. ROM smooth and normal with good flexion and extension and ulnar/radial deviation that is symmetrical with opposite wrist. Palpation is normal over metacarpals, navicular,  lunate, and TFCC; tendons without tenderness/ swelling No snuffbox tenderness. No tenderness over Canal of [place]. Strength 5/5 in all directions without pain. Positive Tinel's and phalens signs. Negative Finkelstein sign. Negative Watson's test.  Procedure: Real-time Ultrasound Guided left median nerve hydrodissection Device: GE Logiq E  Verbal informed consent obtained.  Time-out conducted.  Noted no overlying erythema, induration, or other signs of local infection.  Skin prepped in a sterile fashion.  Local anesthesia: Topical Ethyl chloride.  With sterile technique and under real time ultrasound guidance: Using a 25-gauge needle advanced into the carpal tunnel, then I injected a total of 1 cc kenalog 40, 5 cc lidocaine superficial to and deep to the median nerve freeing it from surrounding structures, I then redirected the needle and injected the rest of the medication around the flexor tendons. Completed without difficulty  Pain immediately resolved suggesting accurate placement of the medication.  Advised to call if fevers/chills, erythema, induration, drainage, or persistent bleeding.  Images permanently stored and available for review in the ultrasound unit.  Impression: Technically successful ultrasound guided injection.  Procedure: Real-time Ultrasound Guided right median nerve hydrodissection Device: GE Logiq E  Verbal informed consent obtained.  Time-out conducted.  Noted no overlying erythema, induration, or other signs of local infection.  Skin prepped in a sterile fashion.  Local anesthesia: Topical Ethyl chloride.  With sterile technique and under real time ultrasound guidance: Using a 25-gauge needle advanced into the carpal tunnel, then I injected a total of 1 cc kenalog 40, 5 cc lidocaine superficial to and deep to the median nerve freeing it from surrounding structures, I then redirected the needle and injected the rest of the medication around the flexor  tendons. Completed without difficulty  Pain immediately resolved suggesting accurate placement of the  medication.  Advised to call if fevers/chills, erythema, induration, drainage, or persistent bleeding.  Images permanently stored and available for review in the ultrasound unit.  Impression: Technically successful ultrasound guided injection.  Impression and Recommendations:    Carpal tunnel syndrome, bilateral Bilateral median nerve hydrodissection. Return as needed. ___________________________________________ [name]. [name], M.D., ABFM., CAQSM. Primary Care and [place] Instructor of [place] of [facility] of Medicine

## 2017-10-05 NOTE — Assessment & Plan Note (Signed)
Bilateral median nerve hydrodissection. Return as needed.

## 2017-10-07 DIAGNOSIS — M961 Postlaminectomy syndrome, not elsewhere classified: Secondary | ICD-10-CM | POA: Diagnosis not present

## 2017-10-07 DIAGNOSIS — G5603 Carpal tunnel syndrome, bilateral upper limbs: Secondary | ICD-10-CM | POA: Diagnosis not present

## 2017-10-07 DIAGNOSIS — M47812 Spondylosis without myelopathy or radiculopathy, cervical region: Secondary | ICD-10-CM | POA: Diagnosis not present

## 2017-10-07 DIAGNOSIS — G894 Chronic pain syndrome: Secondary | ICD-10-CM | POA: Diagnosis not present

## 2017-11-01 ENCOUNTER — Other Ambulatory Visit: Payer: Self-pay | Admitting: Sports Medicine

## 2017-11-12 ENCOUNTER — Telehealth: Payer: Self-pay

## 2017-11-12 ENCOUNTER — Ambulatory Visit (INDEPENDENT_AMBULATORY_CARE_PROVIDER_SITE_OTHER): Payer: Medicare Other | Admitting: Sports Medicine

## 2017-11-12 ENCOUNTER — Encounter: Payer: Self-pay | Admitting: Sports Medicine

## 2017-11-12 DIAGNOSIS — E669 Obesity, unspecified: Secondary | ICD-10-CM

## 2017-11-12 DIAGNOSIS — M51369 Other intervertebral disc degeneration, lumbar region without mention of lumbar back pain or lower extremity pain: Secondary | ICD-10-CM

## 2017-11-12 DIAGNOSIS — M5136 Other intervertebral disc degeneration, lumbar region: Secondary | ICD-10-CM

## 2017-11-12 NOTE — Assessment & Plan Note (Signed)
Post left L5-S1 laminectomy and microdiscectomy in October 2014. MRI from 2016 showed granulation tissue around the left S1 nerve root. She has both axial discogenic pain, as well as persistent left S1 distribution neuropathy. She is currently seeing Sherilyn Cooter with pain management. She is here for a second opinion, I do think we should proceed with a repeat MRI with IV contrast to further evaluate that left S1 nerve root, I also have recommended that we try a series of injections starting with a left selective S1 epidural, if insufficient relief of pain I would then proceed with a left sacroiliac joint injection under ultrasound guidance, and then facet joint injections as well for diagnostic and therapeutic purposes. She is to continue her current medications including her Lexapro, I may switch her to Cymbalta in the future (SNRI). I would also like her to touch base with her neurosurgeon for further advice and confirmation of my plan.

## 2017-11-12 NOTE — Assessment & Plan Note (Signed)
Referral to bariatric surgery. I think this will certainly help her axial back pain.

## 2017-11-12 NOTE — Telephone Encounter (Signed)
Referral placed.

## 2017-11-12 NOTE — Progress Notes (Signed)
Subjective:    CC: Back pain  HPI: This is a pleasant 52 year old female, she is post left L5-S1 laminotomy and microdiscectomy, she has persistent left S1 radiculopathy, the most recent MRI 3 years ago showed granulation tissue around the S1 nerve root, EMG/nerve conduction study showed healing S1 radiculopathy on the left.  She has axial back pain, worse with sitting, flexion, Valsalva and persistent numbness in a left S1 distribution to the lateral left foot.  She is currently seeing Sherilyn Cooter go to pain management and is seeking a second opinion from me.  Symptoms are moderate, persistent.  Radiation as above.  No bowel or bladder dysfunction, saddle numbness, constitutional symptoms, trauma.  She has not seen her neurosurgeon.  I reviewed the past medical history, family history, social history, surgical history, and allergies today and no changes were needed.  Please see the problem list section below in epic for further details.  Past Medical History: Past Medical History:  Diagnosis Date  . Anemia   . Essential hypertension, benign 03/07/2015  . GERD (gastroesophageal reflux disease)   . Hx of ectopic pregnancy   . Hyperlipidemia   . Lumbosacral spondylolysis   . Obese   . OCD (obsessive compulsive disorder)   . Thyroid disease    Past Surgical History: Past Surgical History:  Procedure Laterality Date  . BACK SURGERY    . ECTOPIC PREGNANCY SURGERY    . TUBAL LIGATION    . TUBAL LIGATION     Social History: Social History   Socioeconomic History  . Marital status: Married    Spouse name: None  . Number of children: 1  . Years of education: 45  . Highest education level: None  Social Needs  . Financial resource strain: None  . Food insecurity - worry: None  . Food insecurity - inability: None  . Transportation needs - medical: None  . Transportation needs - non-medical: None  Occupational History    Employer: [employer]  Tobacco Use  . Smoking  status: Never Smoker  . Smokeless tobacco: Never Used  Substance and Sexual Activity  . Alcohol use: Yes    Alcohol/week: 7.2 - 7.8 oz    Types: 6 - 7 Glasses of wine, 6 Standard drinks or equivalent per week  . Drug use: No  . Sexual activity: None  Other Topics Concern  . None  Social History Narrative  . None   Family History: Family History  Problem Relation Age of Onset  . Aneurysm Father   . AAA (abdominal aortic aneurysm) Father   . Depression Mother   . Cancer Mother        Pancreatic  . Thyroid disease Mother        hypothyroidism  . Varicose Veins Mother   . Drug abuse Sister   . Hyperlipidemia Brother   . Diabetes Maternal Grandmother   . Alcohol abuse Maternal Uncle    Allergies: Allergies  Allergen Reactions  . Bee Venom Anaphylaxis   Medications: See med rec.  Review of Systems: No fevers, chills, night sweats, weight loss, chest pain, or shortness of breath.   Objective:    General: Well Developed, well nourished, and in no acute distress.  Neuro: Alert and oriented x3, extra-ocular muscles intact, sensation grossly intact.  HEENT: Normocephalic, atraumatic, pupils equal round reactive to light, neck supple, no masses, no lymphadenopathy, thyroid nonpalpable.  Skin: Warm and dry, no rashes. Cardiac: Regular rate and rhythm, no murmurs rubs or gallops, no lower  extremity edema.  Respiratory: Clear to auscultation bilaterally. Not using accessory muscles, speaking in full sentences.  Impression and Recommendations:    Lumbar degenerative disc disease Post left L5-S1 laminectomy and microdiscectomy in October 2014. MRI from 2016 showed granulation tissue around the left S1 nerve root. She has both axial discogenic pain, as well as persistent left S1 distribution neuropathy. She is currently seeing Sherilyn Cooter with pain management. She is here for a second opinion, I do think we should proceed with a repeat MRI with IV contrast to further evaluate  that left S1 nerve root, I also have recommended that we try a series of injections starting with a left selective S1 epidural, if insufficient relief of pain I would then proceed with a left sacroiliac joint injection under ultrasound guidance, and then facet joint injections as well for diagnostic and therapeutic purposes. She is to continue her current medications including her Lexapro, I may switch her to Cymbalta in the future (SNRI). I would also like her to touch base with her neurosurgeon for further advice and confirmation of my plan.  Obesity Referral to bariatric surgery. I think this will certainly help her axial back pain.  ___________________________________________ [name]. [name], M.D., ABFM., CAQSM. Primary Care and [place] Instructor of [place] of [facility] of Medicine

## 2017-11-12 NOTE — Telephone Encounter (Signed)
Pt left VM stating that her neurologist Dr. Katherine Roan is now with [facility]. Pt states she will need a referral to go back to her. Please assist.

## 2017-11-18 ENCOUNTER — Encounter: Payer: Self-pay | Admitting: Sports Medicine

## 2017-11-18 ENCOUNTER — Other Ambulatory Visit: Payer: Self-pay | Admitting: Sports Medicine

## 2017-11-29 ENCOUNTER — Ambulatory Visit (INDEPENDENT_AMBULATORY_CARE_PROVIDER_SITE_OTHER): Payer: Managed Care, Other (non HMO) | Admitting: Sports Medicine

## 2017-11-29 ENCOUNTER — Encounter: Payer: Self-pay | Admitting: Sports Medicine

## 2017-11-29 DIAGNOSIS — M5136 Other intervertebral disc degeneration, lumbar region: Secondary | ICD-10-CM | POA: Diagnosis not present

## 2017-11-29 DIAGNOSIS — E669 Obesity, unspecified: Secondary | ICD-10-CM | POA: Diagnosis not present

## 2017-11-29 DIAGNOSIS — M5442 Lumbago with sciatica, left side: Secondary | ICD-10-CM | POA: Diagnosis not present

## 2017-11-29 DIAGNOSIS — G8929 Other chronic pain: Secondary | ICD-10-CM | POA: Diagnosis not present

## 2017-11-29 DIAGNOSIS — M5416 Radiculopathy, lumbar region: Secondary | ICD-10-CM | POA: Diagnosis not present

## 2017-11-29 DIAGNOSIS — M47816 Spondylosis without myelopathy or radiculopathy, lumbar region: Secondary | ICD-10-CM | POA: Diagnosis not present

## 2017-11-29 MED ORDER — PHENTERMINE HCL 37.5 MG PO TABS: ORAL_TABLET | ORAL | 0 refills | Status: DC

## 2017-11-29 MED ORDER — ACARBOSE 50 MG PO TABS
50.0000 mg | ORAL_TABLET | Freq: Three times a day (TID) | ORAL | 3 refills | Status: DC
Start: 2017-11-29 — End: 2017-12-27

## 2017-11-29 NOTE — Assessment & Plan Note (Signed)
Starting phentermine, acarbose. She is going to be established with central canal and a surgery to discuss gastric sleeve as well. Return monthly for weight checks and refills.

## 2017-11-29 NOTE — Progress Notes (Signed)
Subjective:    CC: Follow-up  HPI: Morbid obesity: Here to start weight loss medicine, has tried multiple modalities, she also has an appointment with bariatric surgery.  I reviewed the past medical history, family history, social history, surgical history, and allergies today and no changes were needed.  Please see the problem list section below in epic for further details.  Past Medical History: Past Medical History:  Diagnosis Date  . Anemia   . Essential hypertension, benign 03/07/2015  . GERD (gastroesophageal reflux disease)   . Hx of ectopic pregnancy   . Hyperlipidemia   . Lumbosacral spondylolysis   . Obese   . OCD (obsessive compulsive disorder)   . Thyroid disease    Past Surgical History: Past Surgical History:  Procedure Laterality Date  . BACK SURGERY    . ECTOPIC PREGNANCY SURGERY    . TUBAL LIGATION    . TUBAL LIGATION     Social History: Social History   Socioeconomic History  . Marital status: Married    Spouse name: None  . Number of children: 1  . Years of education: 50  . Highest education level: None  Social Needs  . Financial resource strain: None  . Food insecurity - worry: None  . Food insecurity - inability: None  . Transportation needs - medical: None  . Transportation needs - non-medical: None  Occupational History    Employer: [employer]  Tobacco Use  . Smoking status: Never Smoker  . Smokeless tobacco: Never Used  Substance and Sexual Activity  . Alcohol use: Yes    Alcohol/week: 7.2 - 7.8 oz    Types: 6 - 7 Glasses of wine, 6 Standard drinks or equivalent per week  . Drug use: No  . Sexual activity: None  Other Topics Concern  . None  Social History Narrative  . None   Family History: Family History  Problem Relation Age of Onset  . Aneurysm Father   . AAA (abdominal aortic aneurysm) Father   . Depression Mother   . Cancer Mother        Pancreatic  . Thyroid disease Mother        hypothyroidism  .  Varicose Veins Mother   . Drug abuse Sister   . Hyperlipidemia Brother   . Diabetes Maternal Grandmother   . Alcohol abuse Maternal Uncle    Allergies: Allergies  Allergen Reactions  . Bee Venom Anaphylaxis   Medications: See med rec.  Review of Systems: No fevers, chills, night sweats, weight loss, chest pain, or shortness of breath.   Objective:    General: Well Developed, well nourished, and in no acute distress.  Neuro: Alert and oriented x3, extra-ocular muscles intact, sensation grossly intact.  HEENT: Normocephalic, atraumatic, pupils equal round reactive to light, neck supple, no masses, no lymphadenopathy, thyroid nonpalpable.  Skin: Warm and dry, no rashes. Cardiac: Regular rate and rhythm, no murmurs rubs or gallops, no lower extremity edema.  Respiratory: Clear to auscultation bilaterally. Not using accessory muscles, speaking in full sentences.  Impression and Recommendations:    Obesity Starting phentermine, acarbose. She is going to be established with central canal and a surgery to discuss gastric sleeve as well. Return monthly for weight checks and refills.  I spent 25 minutes with this patient, greater than 50% was face-to-face time counseling regarding the above diagnoses ___________________________________________ [name]. [name], M.D., ABFM., CAQSM. Primary Care and [place] Instructor of [place] of  [employer] of Medicine

## 2017-12-10 ENCOUNTER — Ambulatory Visit: Payer: Self-pay | Admitting: Family

## 2017-12-10 ENCOUNTER — Encounter (HOSPITAL_COMMUNITY): Payer: Self-pay

## 2017-12-15 DIAGNOSIS — M5116 Intervertebral disc disorders with radiculopathy, lumbar region: Secondary | ICD-10-CM | POA: Diagnosis not present

## 2017-12-15 DIAGNOSIS — M5117 Intervertebral disc disorders with radiculopathy, lumbosacral region: Secondary | ICD-10-CM | POA: Diagnosis not present

## 2017-12-15 DIAGNOSIS — M4726 Other spondylosis with radiculopathy, lumbar region: Secondary | ICD-10-CM | POA: Diagnosis not present

## 2017-12-15 DIAGNOSIS — M4727 Other spondylosis with radiculopathy, lumbosacral region: Secondary | ICD-10-CM | POA: Diagnosis not present

## 2017-12-16 ENCOUNTER — Encounter: Payer: Self-pay | Admitting: Sports Medicine

## 2017-12-16 ENCOUNTER — Ambulatory Visit (INDEPENDENT_AMBULATORY_CARE_PROVIDER_SITE_OTHER): Payer: Managed Care, Other (non HMO)

## 2017-12-16 ENCOUNTER — Other Ambulatory Visit: Payer: Self-pay | Admitting: Sports Medicine

## 2017-12-16 ENCOUNTER — Ambulatory Visit (INDEPENDENT_AMBULATORY_CARE_PROVIDER_SITE_OTHER): Payer: Managed Care, Other (non HMO) | Admitting: Sports Medicine

## 2017-12-16 DIAGNOSIS — S99922A Unspecified injury of left foot, initial encounter: Secondary | ICD-10-CM

## 2017-12-16 DIAGNOSIS — S92352K Displaced fracture of fifth metatarsal bone, left foot, subsequent encounter for fracture with nonunion: Secondary | ICD-10-CM | POA: Insufficient documentation

## 2017-12-16 DIAGNOSIS — M79672 Pain in left foot: Secondary | ICD-10-CM | POA: Diagnosis not present

## 2017-12-16 NOTE — Assessment & Plan Note (Signed)
4 days ago. X-rays, further management will depend on x-ray results, her tenderness over his over the dorsal base of the fourth and fifth metatarsals.  X-rays are negative, this will simply need an ASO, rehab exercises, return in a couple of weeks.

## 2017-12-16 NOTE — Progress Notes (Signed)
Subjective:    CC: Left foot injury  HPI: This is a pleasant 52 year old female, several days ago, about 5, she inverted her left ankle, had pain, swelling.  No bruising.  She is here to get this checked out, pain is localized over the dorsum of the base of the fourth and fifth metatarsals.  She is able to bear weight appropriately, wearing normal footwear today.  I reviewed the past medical history, family history, social history, surgical history, and allergies today and no changes were needed.  Please see the problem list section below in epic for further details.  Past Medical History: Past Medical History:  Diagnosis Date  . Anemia   . Essential hypertension, benign 03/07/2015  . GERD (gastroesophageal reflux disease)   . Hx of ectopic pregnancy   . Hyperlipidemia   . Lumbosacral spondylolysis   . Obese   . OCD (obsessive compulsive disorder)   . Thyroid disease    Past Surgical History: Past Surgical History:  Procedure Laterality Date  . BACK SURGERY    . ECTOPIC PREGNANCY SURGERY    . TUBAL LIGATION    . TUBAL LIGATION     Social History: Social History   Socioeconomic History  . Marital status: Married    Spouse name: None  . Number of children: 1  . Years of education: 73  . Highest education level: None  Social Needs  . Financial resource strain: None  . Food insecurity - worry: None  . Food insecurity - inability: None  . Transportation needs - medical: None  . Transportation needs - non-medical: None  Occupational History    Employer: [employer]  Tobacco Use  . Smoking status: Never Smoker  . Smokeless tobacco: Never Used  Substance and Sexual Activity  . Alcohol use: Yes    Alcohol/week: 7.2 - 7.8 oz    Types: 6 - 7 Glasses of wine, 6 Standard drinks or equivalent per week  . Drug use: No  . Sexual activity: None  Other Topics Concern  . None  Social History Narrative  . None   Family History: Family History  Problem Relation Age  of Onset  . Aneurysm Father   . AAA (abdominal aortic aneurysm) Father   . Depression Mother   . Cancer Mother        Pancreatic  . Thyroid disease Mother        hypothyroidism  . Varicose Veins Mother   . Drug abuse Sister   . Hyperlipidemia Brother   . Diabetes Maternal Grandmother   . Alcohol abuse Maternal Uncle    Allergies: Allergies  Allergen Reactions  . Bee Venom Anaphylaxis   Medications: See med rec.  Review of Systems: No fevers, chills, night sweats, weight loss, chest pain, or shortness of breath.   Objective:    General: Well Developed, well nourished, and in no acute distress.  Neuro: Alert and oriented x3, extra-ocular muscles intact, sensation grossly intact.  HEENT: Normocephalic, atraumatic, pupils equal round reactive to light, neck supple, no masses, no lymphadenopathy, thyroid nonpalpable.  Skin: Warm and dry, no rashes. Cardiac: Regular rate and rhythm, no murmurs rubs or gallops, no lower extremity edema.  Respiratory: Clear to auscultation bilaterally. Not using accessory muscles, speaking in full sentences. Left ankle: No visible erythema or swelling. Range of motion is full in all directions. Strength is 5/5 in all directions. Stable lateral and medial ligaments; squeeze test and kleiger test unremarkable; Talar dome nontender; No pain at base of  5th MT; No tenderness over cuboid; No tenderness over N spot or navicular prominence No tenderness on posterior aspects of lateral and medial malleolus No sign of peroneal tendon subluxations; Negative tarsal tunnel tinel's Able to walk 4 steps.   Left foot: Minimal swelling with tenderness at the dorsal base of the fourth and fifth metatarsals. Range of motion is full in all directions. Strength is 5/5 in all directions. No hallux valgus. No pes cavus or pes planus. No abnormal callus noted. No pain over the navicular prominence, or base of fifth metatarsal. No tenderness to palpation of the  calcaneal insertion of plantar fascia. No pain at the Achilles insertion. No pain over the calcaneal bursa. No pain of the retrocalcaneal bursa. No tenderness to palpation over the tarsals, metatarsals, or phalanges. No hallux rigidus or limitus. No tenderness palpation over interphalangeal joints. No pain with compression of the metatarsal heads. Neurovascularly intact distally.  X-rays reviewed and are unremarkable.  Nothing but midfoot degenerative changes.    Impression and Recommendations:    Foot injury, left, initial encounter 4 days ago. X-rays, further management will depend on x-ray results, her tenderness over his over the dorsal base of the fourth and fifth metatarsals.  X-rays are negative, this will simply need an ASO, rehab exercises, return in a couple of weeks. ___________________________________________ [name]. [name], M.D., ABFM., CAQSM. Primary Care and [place] Instructor of [place] of [facility] of Medicine

## 2017-12-21 ENCOUNTER — Other Ambulatory Visit: Payer: Self-pay | Admitting: Sports Medicine

## 2017-12-27 ENCOUNTER — Encounter: Payer: Self-pay | Admitting: Sports Medicine

## 2017-12-27 ENCOUNTER — Ambulatory Visit (INDEPENDENT_AMBULATORY_CARE_PROVIDER_SITE_OTHER): Payer: Managed Care, Other (non HMO) | Admitting: Sports Medicine

## 2017-12-27 DIAGNOSIS — G8929 Other chronic pain: Secondary | ICD-10-CM | POA: Diagnosis not present

## 2017-12-27 DIAGNOSIS — E669 Obesity, unspecified: Secondary | ICD-10-CM

## 2017-12-27 DIAGNOSIS — M5416 Radiculopathy, lumbar region: Secondary | ICD-10-CM | POA: Diagnosis not present

## 2017-12-27 DIAGNOSIS — M5442 Lumbago with sciatica, left side: Secondary | ICD-10-CM | POA: Diagnosis not present

## 2017-12-27 DIAGNOSIS — S99922A Unspecified injury of left foot, initial encounter: Secondary | ICD-10-CM

## 2017-12-27 DIAGNOSIS — M5136 Other intervertebral disc degeneration, lumbar region: Secondary | ICD-10-CM | POA: Diagnosis not present

## 2017-12-27 DIAGNOSIS — M47816 Spondylosis without myelopathy or radiculopathy, lumbar region: Secondary | ICD-10-CM | POA: Diagnosis not present

## 2017-12-27 MED ORDER — PHENTERMINE HCL 37.5 MG PO TABS: ORAL_TABLET | ORAL | 0 refills | Status: DC

## 2017-12-27 MED ORDER — ACARBOSE 100 MG PO TABS: 100.0000 mg | ORAL_TABLET | Freq: Three times a day (TID) | ORAL | 0 refills | Status: DC

## 2017-12-27 NOTE — Assessment & Plan Note (Signed)
Simply a sprain, x-rays were negative, feeling better, okay to get back into exercise.

## 2017-12-27 NOTE — Progress Notes (Signed)
Subjective:    CC: Follow-up  HPI: Obesity: 4 pound weight loss on phentermine and acarbose after the first month.  Has still not gotten her appointment with [place] surgery.  Left foot sprain: Resolved.  I reviewed the past medical history, family history, social history, surgical history, and allergies today and no changes were needed.  Please see the problem list section below in epic for further details.  Past Medical History: Past Medical History:  Diagnosis Date  . Anemia   . Essential hypertension, benign 03/07/2015  . GERD (gastroesophageal reflux disease)   . Hx of ectopic pregnancy   . Hyperlipidemia   . Lumbosacral spondylolysis   . Obese   . OCD (obsessive compulsive disorder)   . Thyroid disease    Past Surgical History: Past Surgical History:  Procedure Laterality Date  . BACK SURGERY    . ECTOPIC PREGNANCY SURGERY    . TUBAL LIGATION    . TUBAL LIGATION     Social History: Social History   Socioeconomic History  . Marital status: Married    Spouse name: None  . Number of children: 1  . Years of education: 45  . Highest education level: None  Social Needs  . Financial resource strain: None  . Food insecurity - worry: None  . Food insecurity - inability: None  . Transportation needs - medical: None  . Transportation needs - non-medical: None  Occupational History    Employer: [employer]  Tobacco Use  . Smoking status: Never Smoker  . Smokeless tobacco: Never Used  Substance and Sexual Activity  . Alcohol use: Yes    Alcohol/week: 7.2 - 7.8 oz    Types: 6 - 7 Glasses of wine, 6 Standard drinks or equivalent per week  . Drug use: No  . Sexual activity: None  Other Topics Concern  . None  Social History Narrative  . None   Family History: Family History  Problem Relation Age of Onset  . Aneurysm Father   . AAA (abdominal aortic aneurysm) Father   . Depression Mother   . Cancer Mother        Pancreatic  . Thyroid  disease Mother        hypothyroidism  . Varicose Veins Mother   . Drug abuse Sister   . Hyperlipidemia Brother   . Diabetes Maternal Grandmother   . Alcohol abuse Maternal Uncle    Allergies: Allergies  Allergen Reactions  . Bee Venom Anaphylaxis   Medications: See med rec.  Review of Systems: No fevers, chills, night sweats, weight loss, chest pain, or shortness of breath.   Objective:    General: Well Developed, well nourished, and in no acute distress.  Neuro: Alert and oriented x3, extra-ocular muscles intact, sensation grossly intact.  HEENT: Normocephalic, atraumatic, pupils equal round reactive to light, neck supple, no masses, no lymphadenopathy, thyroid nonpalpable.  Skin: Warm and dry, no rashes. Cardiac: Regular rate and rhythm, no murmurs rubs or gallops, no lower extremity edema.  Respiratory: Clear to auscultation bilaterally. Not using accessory muscles, speaking in full sentences.  Impression and Recommendations:    Obesity Entering the second month, 4 pound weight loss, refilling phentermine, increasing acarbose to 100 mg 3 times daily. She still needs to get her appointment scheduled with [place] surgery to discuss sleeve gastrectomy. Return in 1 month.  Foot injury, left, initial encounter Simply a sprain, x-rays were negative, feeling better, okay to get back into exercise. ___________________________________________ [name]. [name], M.D.,  ABFM., CAQSM. Primary Care and [place] Instructor of [place] of [facility] of Medicine

## 2017-12-27 NOTE — Assessment & Plan Note (Signed)
Entering the second month, 4 pound weight loss, refilling phentermine, increasing acarbose to 100 mg 3 times daily. She still needs to get her appointment scheduled with [place] surgery to discuss sleeve gastrectomy. Return in 1 month.

## 2017-12-30 DIAGNOSIS — G894 Chronic pain syndrome: Secondary | ICD-10-CM | POA: Diagnosis not present

## 2017-12-30 DIAGNOSIS — M47812 Spondylosis without myelopathy or radiculopathy, cervical region: Secondary | ICD-10-CM | POA: Diagnosis not present

## 2017-12-30 DIAGNOSIS — M961 Postlaminectomy syndrome, not elsewhere classified: Secondary | ICD-10-CM | POA: Diagnosis not present

## 2017-12-30 DIAGNOSIS — G5603 Carpal tunnel syndrome, bilateral upper limbs: Secondary | ICD-10-CM | POA: Diagnosis not present

## 2017-12-31 ENCOUNTER — Ambulatory Visit (HOSPITAL_COMMUNITY)
Admission: RE | Admit: 2017-12-31 | Discharge: 2017-12-31 | Disposition: A | Discharge status: Home / Self Care | Payer: 59 | Source: Ambulatory Visit | Attending: Family | Admitting: Family

## 2017-12-31 ENCOUNTER — Encounter: Payer: Self-pay | Admitting: Family

## 2017-12-31 ENCOUNTER — Ambulatory Visit (INDEPENDENT_AMBULATORY_CARE_PROVIDER_SITE_OTHER): Payer: 59 | Admitting: Family

## 2017-12-31 ENCOUNTER — Other Ambulatory Visit: Payer: Self-pay

## 2017-12-31 VITALS — BP 123/77 | HR 70 | Temp 97.0°F | Resp 16 | Ht 64.5 in | Wt 263.0 lb

## 2017-12-31 DIAGNOSIS — I7771 Dissection of carotid artery: Secondary | ICD-10-CM

## 2017-12-31 DIAGNOSIS — I6523 Occlusion and stenosis of bilateral carotid arteries: Secondary | ICD-10-CM | POA: Insufficient documentation

## 2017-12-31 DIAGNOSIS — I773 Arterial fibromuscular dysplasia: Secondary | ICD-10-CM

## 2017-12-31 NOTE — Patient Instructions (Addendum)

## 2017-12-31 NOTE — Progress Notes (Signed)
Chief Complaint: Follow up Extracranial Carotid Artery Stenosis   History of Present Illness  Roselinda Bhavya Eschete is a 52 y.o. female who presents with chief complaint: routine follow-up. Previous carotid studies demonstrated: RICA <51% stenosis, LICA resolved dissection.  Patient has not had previous carotid artery intervention. She denies any known history of stroke or TIA. Specifically he deniesa history of amaurosis fugax or monocular blindness, unilateral facial drooping, hemiplegia, orreceptive or expressive aphasia.    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  She is receiving physical therapy for c-spine issues causing burning and pain at her trapezius area.   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   Pt Diabetic: no Pt smoker: non-smoker  Pt meds include: Statin : yes ASA: yes Other anticoagulants/antiplatelets: no   Past Medical History:  Diagnosis Date  . Anemia   . Essential hypertension, benign 03/07/2015  . GERD (gastroesophageal reflux disease)   . Hx of ectopic pregnancy   . Hyperlipidemia   . Lumbosacral spondylolysis   . Obese   . OCD (obsessive compulsive disorder)   . Thyroid disease     Social History Social History   Tobacco Use  . Smoking status: Never Smoker  . Smokeless tobacco: Never Used  Substance Use Topics  . Alcohol use: Yes    Alcohol/week: 7.2 - 7.8 oz    Types: 6 - 7 Glasses of wine, 6 Standard drinks or equivalent per week  . Drug use: No    Family History Family History  Problem Relation Age of Onset  . Aneurysm Father   . AAA (abdominal aortic aneurysm) Father   . Depression Mother   . Cancer Mother        Pancreatic  . Thyroid disease Mother        hypothyroidism  . Varicose Veins Mother   . Drug abuse Sister   . Hyperlipidemia Brother   . Diabetes Maternal Grandmother   . Alcohol abuse Maternal Uncle      Surgical History Past Surgical History:  Procedure Laterality Date  . BACK SURGERY    . ECTOPIC PREGNANCY SURGERY    . TUBAL LIGATION    . TUBAL LIGATION      Allergies  Allergen Reactions  . Bee Venom Anaphylaxis    Current Outpatient Medications  Medication Sig Dispense Refill  . acarbose (PRECOSE) 100 MG tablet Take 1 tablet (100 mg total) by mouth 3 (three) times daily with meals. 90 tablet 0  . aspirin 81 MG EC tablet TAKE 1 TABLET (81 MG TOTAL) BY MOUTH DAILY. 90 tablet 3  . Cholecalciferol (VITAMIN D3) 2000 UNITS TABS Take by mouth 2 (two) times a week. TAKE 2 TABLETS Sunday AND 1 TABLET Tuesday AND Thursday.    . escitalopram (LEXAPRO) 20 MG tablet TAKE 1 TABLET (20 MG TOTAL) BY MOUTH DAILY. 90 tablet 0  . levothyroxine (SYNTHROID, LEVOTHROID) 137 MCG tablet Take 1 tablet (137 mcg total) by mouth daily before breakfast. 90 tablet 3  . Mag Aspart-Potassium Aspart (POTASSIUM & MAGNESIUM ASPARTAT) 250-250 MG CAPS Take by mouth. TAKE 2 CAPSULES Tuesday  AND Thursday  MORNING    . omeprazole (PRILOSEC) 40 MG capsule Take 1 capsule (40 mg total) by mouth daily. 90 capsule 3  . oxyCODONE-acetaminophen (PERCOCET/ROXICET) 5-325 MG tablet Take by mouth.    . phentermine (ADIPEX-P) 37.5 MG tablet One tab by mouth qAM 30  tablet 0  . rosuvastatin (CRESTOR) 10 MG tablet TAKE 1 TABLET (10 MG TOTAL) BY MOUTH DAILY. 30 tablet 0  . TraMADol HCl 300 MG CP24 Take 1 capsule by mouth daily. 90 capsule 0  . traZODone (DESYREL) 100 MG tablet TAKE 1 OR 2 TABLETS BY MOUTH AT BEDTIME AS DIRECTED 90 tablet 0   No current facility-administered medications for this visit.     Review of Systems : See HPI for pertinent positives and negatives.  Physical Examination  Vitals:   12/31/17 0839 12/31/17 0847  BP: 125/82 123/77  Pulse: 70 70  Resp: 16   Temp: (!) 97 F (36.1 C)   TempSrc: Oral   SpO2: 97%   Weight: 263 lb (119.3 kg)   Height: 5' 4.5" (1.638 m)    Body mass index is 44.45  kg/m.    General: WDWN morbidly obese female in NAD GAIT: normal Eyes: PERRLA HENT: No gross abnormalities.  Pulmonary:  Respirations are non-labored, good air movement, CTAB, no rales, rhonchi, or wheezing. Cardiac: regular rhythm, no detected murmur.  VASCULAR EXAM Carotid Bruits Right Left   Negative Negative     Abdominal aortic pulse is not palpable. Radial pulses are 2+ palpable and equal.                                                                                                                            LE Pulses Right Left       POPLITEAL  not palpable   not palpable       POSTERIOR TIBIAL  not palpable   not palpable        DORSALIS PEDIS      ANTERIOR TIBIAL 2+ palpable  2+ palpable     Gastrointestinal: soft, nontender, BS WNL, no r/g, no palpable masses. Musculoskeletal: No muscle atrophy/wasting. M/S 5/5 throughout, extremities without ischemic changes. Skin: No rashes, no ulcers, no cellulitis.   Neurologic:  A&O X 3; appropriate affect, sensation is normal; speech is normal, CN 2-12 intact, pain and light touch intact in extremities, motor exam as listed above. Psychiatric: Normal thought content, mood appropriate to clinical situation.    Assessment: Aysia Lowder is a 52 y.o. 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  She has no history of stroke or TIA.  Her blood pressure remains in control on no antihypertensive medications.    DATA Carotid Duplex (12/31/17): 40-59% stenosis in bilateral ICA. Disease visualized in the left ICA, but is also tortuous.  Bilateral vertebral artery flow is antegrade.  Bilateral subclavian artery waveforms are normal.  Increased stenosis in bilateral ICA compared to the exam on 12-04-16, but comparable to the exam on 11-29-15.    Plan: Follow-up in 1 year with Carotid Duplex scan.     I discussed in depth with the patient the nature of atherosclerosis, and emphasized the  importance of maximal medical management including strict control of blood pressure, blood  glucose, and lipid levels, obtaining regular exercise, and continued cessation of smoking.  The patient is aware that without maximal medical management the underlying atherosclerotic disease process will progress, limiting the benefit of any interventions. The patient was given information about stroke prevention and what symptoms should prompt the patient to seek immediate medical care. Thank you for allowing [place] to participate in this patient's care.  Clemon Chambers, RN, MSN, FNP-C [facility] Office: [phone]  Clinic Physician: Chen/Cain  12/31/17 8:56 AM

## 2018-01-07 ENCOUNTER — Other Ambulatory Visit: Payer: Self-pay | Admitting: Sports Medicine

## 2018-01-14 ENCOUNTER — Ambulatory Visit (INDEPENDENT_AMBULATORY_CARE_PROVIDER_SITE_OTHER): Payer: 59 | Admitting: Sports Medicine

## 2018-01-14 ENCOUNTER — Encounter: Payer: Self-pay | Admitting: Sports Medicine

## 2018-01-14 DIAGNOSIS — G5603 Carpal tunnel syndrome, bilateral upper limbs: Secondary | ICD-10-CM

## 2018-01-14 NOTE — Progress Notes (Signed)
Subjective:    CC: Hand numbness  HPI: Evanell is a pleasant 52 year old female, she has known carpal tunnel syndrome, previous injection was many many months ago, bilateral.  She is now having a recurrence of paresthesias in her right hand, moderate, persistent, median nerve distribution.  Desires repeat interventional treatment.   I reviewed the past medical history, family history, social history, surgical history, and allergies today and no changes were needed.  Please see the problem list section below in epic for further details.  Past Medical History: Past Medical History:  Diagnosis Date  . Anemia   . Essential hypertension, benign 03/07/2015  . GERD (gastroesophageal reflux disease)   . Hx of ectopic pregnancy   . Hyperlipidemia   . Lumbosacral spondylolysis   . Obese   . OCD (obsessive compulsive disorder)   . Thyroid disease    Past Surgical History: Past Surgical History:  Procedure Laterality Date  . BACK SURGERY    . ECTOPIC PREGNANCY SURGERY    . TUBAL LIGATION    . TUBAL LIGATION     Social History: Social History   Socioeconomic History  . Marital status: Married    Spouse name: Not on file  . Number of children: 1  . Years of education: 5  . Highest education level: Not on file  Occupational History    Employer: [employer]  Social Needs  . Financial resource strain: Not on file  . Food insecurity:    Worry: Not on file    Inability: Not on file  . Transportation needs:    Medical: Not on file    Non-medical: Not on file  Tobacco Use  . Smoking status: Never Smoker  . Smokeless tobacco: Never Used  Substance and Sexual Activity  . Alcohol use: Yes    Alcohol/week: 7.2 - 7.8 oz    Types: 6 - 7 Glasses of wine, 6 Standard drinks or equivalent per week  . Drug use: No  . Sexual activity: Not on file  Lifestyle  . Physical activity:    Days per week: Not on file    Minutes per session: Not on file  . Stress: Not on file    Relationships  . Social connections:    Talks on phone: Not on file    Gets together: Not on file    Attends religious service: Not on file    Active member of club or organization: Not on file    Attends meetings of clubs or organizations: Not on file    Relationship status: Not on file  Other Topics Concern  . Not on file  Social History Narrative  . Not on file   Family History: Family History  Problem Relation Age of Onset  . Aneurysm Father   . AAA (abdominal aortic aneurysm) Father   . Depression Mother   . Cancer Mother        Pancreatic  . Thyroid disease Mother        hypothyroidism  . Varicose Veins Mother   . Drug abuse Sister   . Hyperlipidemia Brother   . Diabetes Maternal Grandmother   . Alcohol abuse Maternal Uncle    Allergies: Allergies  Allergen Reactions  . Bee Venom Anaphylaxis   Medications: See med rec.  Review of Systems: No fevers, chills, night sweats, weight loss, chest pain, or shortness of breath.   Objective:    General: Well Developed, well nourished, and in no acute distress.  Neuro: Alert and oriented  x3, extra-ocular muscles intact, sensation grossly intact.  HEENT: Normocephalic, atraumatic, pupils equal round reactive to light, neck supple, no masses, no lymphadenopathy, thyroid nonpalpable.  Skin: Warm and dry, no rashes. Cardiac: Regular rate and rhythm, no murmurs rubs or gallops, no lower extremity edema.  Respiratory: Clear to auscultation bilaterally. Not using accessory muscles, speaking in full sentences. Right wrist: Inspection normal with no visible erythema or swelling. ROM smooth and normal with good flexion and extension and ulnar/radial deviation that is symmetrical with opposite wrist. Palpation is normal over metacarpals, navicular, lunate, and TFCC; tendons without tenderness/ swelling No snuffbox tenderness. No tenderness over Canal of [place]. Strength 5/5 in all directions without pain. Positive Tinel's and  phalens signs. Negative Finkelstein sign. Negative Watson's test.  Procedure: Real-time Ultrasound Guided Hydrodissection of right median nerve at the carpal tunnel Device: GE Logiq E  Verbal informed consent obtained.  Time-out conducted.  Noted no overlying erythema, induration, or other signs of local infection.  Skin prepped in a sterile fashion.  Local anesthesia: Topical Ethyl chloride.  With sterile technique and under real time ultrasound guidance: Using a 25-gauge needle advanced into the carpal tunnel, taking care to avoid intraneural injection I injected medication but superficial to and deep to the median nerve, freeing it from surrounding structures, I then redirected the needle deep to the carpal tunnel injected further medication for a total of 1 cc kenalog 40, 5 cc lidocaine Completed without difficulty  Pain immediately resolved suggesting accurate placement of the medication.  Advised to call if fevers/chills, erythema, induration, drainage, or persistent bleeding.  Images permanently stored and available for review in the ultrasound unit.  Impression: Technically successful ultrasound guided injection.  Impression and Recommendations:    Carpal tunnel syndrome, bilateral Right-sided median nerve hydrodissection under ultrasound guidance, previous procedure was bilateral and back in December, return as needed. ___________________________________________ [name]. [name], M.D., ABFM., CAQSM. Primary Care and [place] Instructor of [place] of [facility] of Medicine

## 2018-01-14 NOTE — Assessment & Plan Note (Signed)
Right-sided median nerve hydrodissection under ultrasound guidance, previous procedure was bilateral and back in December, return as needed.

## 2018-01-19 ENCOUNTER — Other Ambulatory Visit: Payer: Self-pay | Admitting: Sports Medicine

## 2018-01-20 ENCOUNTER — Other Ambulatory Visit: Payer: Self-pay | Admitting: Sports Medicine

## 2018-01-25 ENCOUNTER — Other Ambulatory Visit: Payer: Self-pay | Admitting: Sports Medicine

## 2018-01-25 DIAGNOSIS — E669 Obesity, unspecified: Secondary | ICD-10-CM

## 2018-01-27 ENCOUNTER — Other Ambulatory Visit: Payer: Self-pay | Admitting: Sports Medicine

## 2018-01-28 ENCOUNTER — Ambulatory Visit (INDEPENDENT_AMBULATORY_CARE_PROVIDER_SITE_OTHER): Payer: Medicare Other | Admitting: Sports Medicine

## 2018-01-28 DIAGNOSIS — E669 Obesity, unspecified: Secondary | ICD-10-CM

## 2018-01-28 MED ORDER — ACARBOSE 100 MG PO TABS: 100.0000 mg | ORAL_TABLET | Freq: Three times a day (TID) | ORAL | 0 refills | Status: DC

## 2018-01-28 MED ORDER — PHENTERMINE HCL 37.5 MG PO TABS: ORAL_TABLET | ORAL | 0 refills | Status: DC

## 2018-01-28 NOTE — Progress Notes (Signed)
Subjective:    CC: Weight check  HPI: Entering the third month, fantastic 18 pound weight loss over the last month, refilling phentermine, she has been on acarbose 100 mg 3 times per day. Still awaiting her visit with [place] [facility] to discuss sleeve gastrectomy.  Carpal tunnel syndrome is now resolved after median nerve hydrodissection at the last visit.  I reviewed the past medical history, family history, social history, surgical history, and allergies today and no changes were needed.  Please see the problem list section below in epic for further details.  Past Medical History: Past Medical History:  Diagnosis Date  . Anemia   . Essential hypertension, benign 03/07/2015  . GERD (gastroesophageal reflux disease)   . Hx of ectopic pregnancy   . Hyperlipidemia   . Lumbosacral spondylolysis   . Obese   . OCD (obsessive compulsive disorder)   . Thyroid disease    Past Surgical History: Past Surgical History:  Procedure Laterality Date  . BACK SURGERY    . ECTOPIC PREGNANCY SURGERY    . TUBAL LIGATION    . TUBAL LIGATION     Social History: Social History   Socioeconomic History  . Marital status: Married    Spouse name: Not on file  . Number of children: 1  . Years of education: 10  . Highest education level: Not on file  Occupational History    Employer: [employer]  Social Needs  . Financial resource strain: Not on file  . Food insecurity:    Worry: Not on file    Inability: Not on file  . Transportation needs:    Medical: Not on file    Non-medical: Not on file  Tobacco Use  . Smoking status: Never Smoker  . Smokeless tobacco: Never Used  Substance and Sexual Activity  . Alcohol use: Yes    Alcohol/week: 7.2 - 7.8 oz    Types: 6 - 7 Glasses of wine, 6 Standard drinks or equivalent per week  . Drug use: No  . Sexual activity: Not on file  Lifestyle  . Physical activity:    Days per week: Not on file    Minutes per session: Not on  file  . Stress: Not on file  Relationships  . Social connections:    Talks on phone: Not on file    Gets together: Not on file    Attends religious service: Not on file    Active member of club or organization: Not on file    Attends meetings of clubs or organizations: Not on file    Relationship status: Not on file  Other Topics Concern  . Not on file  Social History Narrative  . Not on file   Family History: Family History  Problem Relation Age of Onset  . Aneurysm Father   . AAA (abdominal aortic aneurysm) Father   . Depression Mother   . Cancer Mother        Pancreatic  . Thyroid disease Mother        hypothyroidism  . Varicose Veins Mother   . Drug abuse Sister   . Hyperlipidemia Brother   . Diabetes Maternal Grandmother   . Alcohol abuse Maternal Uncle    Allergies: Allergies  Allergen Reactions  . Bee Venom Anaphylaxis   Medications: See med rec.  Review of Systems: No fevers, chills, night sweats, weight loss, chest pain, or shortness of breath.   Objective:    General: Well Developed, well nourished, and in  no acute distress.  Neuro: Alert and oriented x3, extra-ocular muscles intact, sensation grossly intact.  HEENT: Normocephalic, atraumatic, pupils equal round reactive to light, neck supple, no masses, no lymphadenopathy, thyroid nonpalpable.  Skin: Warm and dry, no rashes. Cardiac: Regular rate and rhythm, no murmurs rubs or gallops, no lower extremity edema.  Respiratory: Clear to auscultation bilaterally. Not using accessory muscles, speaking in full sentences.  Impression and Recommendations:    Obesity Did well after the third month of phentermine, full dose acarbose. Refilling medication, entering the fourth month.  ___________________________________________ [name]. [name], M.D., ABFM., CAQSM. Primary Care and [place] Instructor of [place] of [facility] of Medicine

## 2018-01-28 NOTE — Assessment & Plan Note (Signed)
Did well after the third month of phentermine, full dose acarbose. Refilling medication, entering the fourth month.

## 2018-02-14 ENCOUNTER — Other Ambulatory Visit: Payer: Self-pay | Admitting: Sports Medicine

## 2018-02-22 ENCOUNTER — Other Ambulatory Visit: Payer: Self-pay | Admitting: Sports Medicine

## 2018-02-22 ENCOUNTER — Encounter: Payer: Self-pay | Admitting: Sports Medicine

## 2018-03-03 ENCOUNTER — Ambulatory Visit (INDEPENDENT_AMBULATORY_CARE_PROVIDER_SITE_OTHER): Payer: Managed Care, Other (non HMO) | Admitting: Sports Medicine

## 2018-03-03 ENCOUNTER — Encounter: Payer: Self-pay | Admitting: Sports Medicine

## 2018-03-03 VITALS — BP 127/81 | HR 78 | Wt 241.3 lb

## 2018-03-03 DIAGNOSIS — E6609 Other obesity due to excess calories: Secondary | ICD-10-CM

## 2018-03-03 DIAGNOSIS — E669 Obesity, unspecified: Secondary | ICD-10-CM | POA: Diagnosis not present

## 2018-03-03 DIAGNOSIS — E039 Hypothyroidism, unspecified: Secondary | ICD-10-CM

## 2018-03-03 DIAGNOSIS — R739 Hyperglycemia, unspecified: Secondary | ICD-10-CM

## 2018-03-03 DIAGNOSIS — N951 Menopausal and female climacteric states: Secondary | ICD-10-CM

## 2018-03-03 DIAGNOSIS — I10 Essential (primary) hypertension: Secondary | ICD-10-CM

## 2018-03-03 MED ORDER — ACARBOSE 100 MG PO TABS: 100.0000 mg | ORAL_TABLET | Freq: Three times a day (TID) | ORAL | 0 refills | Status: DC

## 2018-03-03 MED ORDER — ROSUVASTATIN CALCIUM 10 MG PO TABS: 10.0000 mg | ORAL_TABLET | Freq: Every day | ORAL | 0 refills | Status: DC

## 2018-03-03 MED ORDER — PHENTERMINE HCL 37.5 MG PO TABS: ORAL_TABLET | ORAL | 0 refills | Status: DC

## 2018-03-03 MED ORDER — TRAZODONE HCL 100 MG PO TABS: 100.0000 mg | ORAL_TABLET | Freq: Every day | ORAL | 0 refills | Status: DC

## 2018-03-03 MED ORDER — ESCITALOPRAM OXALATE 20 MG PO TABS
20.0000 mg | ORAL_TABLET | Freq: Every day | ORAL | 0 refills | Status: DC
Start: 2018-03-03 — End: 2018-03-13

## 2018-03-03 NOTE — Assessment & Plan Note (Signed)
Rechecking some routine labs.

## 2018-03-03 NOTE — Assessment & Plan Note (Addendum)
Good weight loss, entering the fifth month. Refilling phentermine and full dose acarbose.   Return in a month. Waist circumference 44 inches

## 2018-03-03 NOTE — Progress Notes (Signed)
Subjective:    CC: Weight check  HPI: Good weight loss after the fourth month on phentermine, acarbose.  Also needs a biometric screening and some routine labs.  Increasing hot flashes, fatigue, changes in weight, occasional spotting.  Would like some perimenopausal labs done again.  I reviewed the past medical history, family history, social history, surgical history, and allergies today and no changes were needed.  Please see the problem list section below in epic for further details.  Past Medical History: Past Medical History:  Diagnosis Date  . Anemia   . Essential hypertension, benign 03/07/2015  . GERD (gastroesophageal reflux disease)   . Hx of ectopic pregnancy   . Hyperlipidemia   . Lumbosacral spondylolysis   . Obese   . OCD (obsessive compulsive disorder)   . Thyroid disease    Past Surgical History: Past Surgical History:  Procedure Laterality Date  . BACK SURGERY    . ECTOPIC PREGNANCY SURGERY    . TUBAL LIGATION    . TUBAL LIGATION     Social History: Social History   Socioeconomic History  . Marital status: Married    Spouse name: Not on file  . Number of children: 1  . Years of education: 73  . Highest education level: Not on file  Occupational History    Employer: [employer]  Social Needs  . Financial resource strain: Not on file  . Food insecurity:    Worry: Not on file    Inability: Not on file  . Transportation needs:    Medical: Not on file    Non-medical: Not on file  Tobacco Use  . Smoking status: Never Smoker  . Smokeless tobacco: Never Used  Substance and Sexual Activity  . Alcohol use: Yes    Alcohol/week: 7.2 - 7.8 oz    Types: 6 - 7 Glasses of wine, 6 Standard drinks or equivalent per week  . Drug use: No  . Sexual activity: Not on file  Lifestyle  . Physical activity:    Days per week: Not on file    Minutes per session: Not on file  . Stress: Not on file  Relationships  . Social connections:    Talks on phone:  Not on file    Gets together: Not on file    Attends religious service: Not on file    Active member of club or organization: Not on file    Attends meetings of clubs or organizations: Not on file    Relationship status: Not on file  Other Topics Concern  . Not on file  Social History Narrative  . Not on file   Family History: Family History  Problem Relation Age of Onset  . Aneurysm Father   . AAA (abdominal aortic aneurysm) Father   . Depression Mother   . Cancer Mother        Pancreatic  . Thyroid disease Mother        hypothyroidism  . Varicose Veins Mother   . Drug abuse Sister   . Hyperlipidemia Brother   . Diabetes Maternal Grandmother   . Alcohol abuse Maternal Uncle    Allergies: Allergies  Allergen Reactions  . Bee Venom Anaphylaxis   Medications: See med rec.  Review of Systems: No fevers, chills, night sweats, weight loss, chest pain, or shortness of breath.   Objective:    General: Well Developed, well nourished, and in no acute distress.  Neuro: Alert and oriented x3, extra-ocular muscles intact, sensation grossly intact.  HEENT: Normocephalic, atraumatic, pupils equal round reactive to light, neck supple, no masses, no lymphadenopathy, thyroid nonpalpable.  Skin: Warm and dry, no rashes. Cardiac: Regular rate and rhythm, no murmurs rubs or gallops, no lower extremity edema.  Respiratory: Clear to auscultation bilaterally. Not using accessory muscles, speaking in full sentences.  Impression and Recommendations:    Obesity Good weight loss, entering the fifth month. Refilling phentermine and full dose acarbose.   Return in a month. Waist circumference 44 inches  Perimenopausal Rechecking FSH, LH, estrogen, progesterone. Having some hot flashes, intermittent spotting. She is post endometrial ablation.  Essential hypertension, benign Rechecking some routine labs.  I spent 25 minutes with this patient, greater than 50% was face-to-face time  counseling regarding the above diagnoses ___________________________________________ Kelsey Snow. Kelsey Snow, M.D., ABFM., CAQSM. Primary Care and [place] Instructor of [place] of [facility] [address] of Medicine

## 2018-03-03 NOTE — Assessment & Plan Note (Signed)
Rechecking FSH, LH, estrogen, progesterone. Having some hot flashes, intermittent spotting. She is post endometrial ablation.

## 2018-03-03 NOTE — Telephone Encounter (Signed)
Controlled substance 

## 2018-03-08 ENCOUNTER — Telehealth: Payer: Self-pay

## 2018-03-08 NOTE — Telephone Encounter (Signed)
-----   Message from Silverio Decamp, MD sent at 03/08/2018  4:18 PM EDT ----- Labs are consistent with early menopause, still awaiting estrogen levels.

## 2018-03-08 NOTE — Telephone Encounter (Signed)
Pt advised of current lab results. Told pt we would call her back with additional labs as we receive them.  No needs or concerns at this time.

## 2018-03-09 LAB — LIPID PANEL W/REFLEX DIRECT LDL
Cholesterol: 140 mg/dL (ref <200)
HDL: 57 mg/dL (ref >50)
LDL Cholesterol (Calc): 68 mg/dL (calc)
Non-HDL Cholesterol (Calc): 83 mg/dL (ref <130)
Total CHOL/HDL Ratio: 2.5 (calc) (ref <5.0)
Triglycerides: 66 mg/dL (ref <150)

## 2018-03-09 LAB — CBC
HCT: 41.5 % (ref 35.0–45.0)
Hemoglobin: 13.9 g/dL (ref 11.7–15.5)
MCH: 29.4 pg (ref 27.0–33.0)
MCHC: 33.5 g/dL (ref 32.0–36.0)
MCV: 87.9 fL (ref 80.0–100.0)
MPV: 10.3 fL (ref 7.5–12.5)
Platelets: 272 10*3/uL (ref 140–400)
RBC: 4.72 Million/uL (ref 3.80–5.10)
RDW: 13.2 % (ref 11.0–15.0)
WBC: 5.3 10*3/uL (ref 3.8–10.8)

## 2018-03-09 LAB — COMPREHENSIVE METABOLIC PANEL WITH GFR
Albumin: 4.2 g/dL (ref 3.6–5.1)
CO2: 30 mmol/L (ref 20–32)
Calcium: 9.6 mg/dL (ref 8.6–10.4)
Chloride: 106 mmol/L (ref 98–110)
Globulin: 2.5 g/dL (ref 1.9–3.7)
Potassium: 4.6 mmol/L (ref 3.5–5.3)
Total Bilirubin: 0.3 mg/dL (ref 0.2–1.2)

## 2018-03-09 LAB — HEMOGLOBIN A1C
Hgb A1c MFr Bld: 5.9 %{Hb} — ABNORMAL HIGH (ref <5.7)
Mean Plasma Glucose: 123 (calc)
eAG (mmol/L): 6.8 (calc)

## 2018-03-09 LAB — COMPREHENSIVE METABOLIC PANEL
AG Ratio: 1.7 (calc) (ref 1.0–2.5)
ALT: 24 U/L (ref 6–29)
AST: 20 U/L (ref 10–35)
Alkaline phosphatase (APISO): 44 U/L (ref 33–130)
BUN: 14 mg/dL (ref 7–25)
Creat: 0.9 mg/dL (ref 0.50–1.05)
Glucose, Bld: 90 mg/dL (ref 65–99)
Sodium: 141 mmol/L (ref 135–146)
Total Protein: 6.7 g/dL (ref 6.1–8.1)

## 2018-03-09 LAB — TSH: TSH: 0.27 mIU/L — ABNORMAL LOW

## 2018-03-09 LAB — ESTROGENS, TOTAL: Estrogen: 185.4 pg/mL

## 2018-03-09 LAB — FOLLICLE STIMULATING HORMONE: FSH: 40.6 m[IU]/mL

## 2018-03-09 LAB — PROGESTERONE: Progesterone: <0.5 ng/mL

## 2018-03-09 LAB — LUTEINIZING HORMONE: LH: 14.4 m[IU]/mL

## 2018-03-13 ENCOUNTER — Other Ambulatory Visit: Payer: Self-pay | Admitting: Sports Medicine

## 2018-03-16 ENCOUNTER — Other Ambulatory Visit: Payer: Self-pay | Admitting: Sports Medicine

## 2018-03-21 ENCOUNTER — Encounter: Payer: Self-pay | Admitting: Sports Medicine

## 2018-03-22 MED ORDER — TRAZODONE HCL 100 MG PO TABS: 100.0000 mg | ORAL_TABLET | Freq: Every day | ORAL | 3 refills | Status: DC

## 2018-03-22 MED ORDER — LEVOTHYROXINE SODIUM 137 MCG PO TABS: ORAL_TABLET | ORAL | 3 refills | Status: DC

## 2018-03-22 MED ORDER — OMEPRAZOLE 40 MG PO CPDR: 40.0000 mg | DELAYED_RELEASE_CAPSULE | Freq: Every day | ORAL | 3 refills | Status: DC

## 2018-03-22 MED ORDER — ROSUVASTATIN CALCIUM 10 MG PO TABS: 10.0000 mg | ORAL_TABLET | Freq: Every day | ORAL | 3 refills | Status: DC

## 2018-03-22 MED ORDER — ESCITALOPRAM OXALATE 20 MG PO TABS: 20.0000 mg | ORAL_TABLET | Freq: Every day | ORAL | 3 refills | Status: DC

## 2018-04-01 ENCOUNTER — Encounter: Payer: Self-pay | Admitting: Sports Medicine

## 2018-04-01 ENCOUNTER — Ambulatory Visit (INDEPENDENT_AMBULATORY_CARE_PROVIDER_SITE_OTHER): Payer: 59 | Admitting: Sports Medicine

## 2018-04-01 DIAGNOSIS — G5603 Carpal tunnel syndrome, bilateral upper limbs: Secondary | ICD-10-CM | POA: Diagnosis not present

## 2018-04-01 DIAGNOSIS — E669 Obesity, unspecified: Secondary | ICD-10-CM | POA: Diagnosis not present

## 2018-04-01 MED ORDER — PHENTERMINE HCL 37.5 MG PO TABS
ORAL_TABLET | ORAL | 0 refills | Status: DC
Start: 2018-04-01 — End: 2018-04-29

## 2018-04-01 MED ORDER — ACARBOSE 100 MG PO TABS: 100.0000 mg | ORAL_TABLET | Freq: Three times a day (TID) | ORAL | 0 refills | Status: DC

## 2018-04-01 NOTE — Assessment & Plan Note (Signed)
6 pound weight loss, entering the sixth month. Refilling phentermine and full dose acarbose.

## 2018-04-01 NOTE — Progress Notes (Signed)
Subjective:    CC: Carpal tunnel syndrome  HPI: Obesity: Entering the sixth month, 6 pound weight loss over the last month.  On phentermine and acarbose.  Carpal tunnel syndrome: Previous injection on the left side was in December 2018, we did her right side in March of this year.  Increasing in symptoms on the left, paresthesias, pain.  Desires repeat median nerve hydrodissection.  I reviewed the past medical history, family history, social history, surgical history, and allergies today and no changes were needed.  Please see the problem list section below in epic for further details.  Past Medical History: Past Medical History:  Diagnosis Date  . Anemia   . Essential hypertension, benign 03/07/2015  . GERD (gastroesophageal reflux disease)   . Hx of ectopic pregnancy   . Hyperlipidemia   . Lumbosacral spondylolysis   . Obese   . OCD (obsessive compulsive disorder)   . Thyroid disease    Past Surgical History: Past Surgical History:  Procedure Laterality Date  . BACK SURGERY    . ECTOPIC PREGNANCY SURGERY    . TUBAL LIGATION    . TUBAL LIGATION     Social History: Social History   Socioeconomic History  . Marital status: Married    Spouse name: Not on file  . Number of children: 1  . Years of education: 67  . Highest education level: Not on file  Occupational History    Employer: [employer]  Social Needs  . Financial resource strain: Not on file  . Food insecurity:    Worry: Not on file    Inability: Not on file  . Transportation needs:    Medical: Not on file    Non-medical: Not on file  Tobacco Use  . Smoking status: Never Smoker  . Smokeless tobacco: Never Used  Substance and Sexual Activity  . Alcohol use: Yes    Alcohol/week: 7.2 - 7.8 oz    Types: 6 - 7 Glasses of wine, 6 Standard drinks or equivalent per week  . Drug use: No  . Sexual activity: Not on file  Lifestyle  . Physical activity:    Days per week: Not on file    Minutes per  session: Not on file  . Stress: Not on file  Relationships  . Social connections:    Talks on phone: Not on file    Gets together: Not on file    Attends religious service: Not on file    Active member of club or organization: Not on file    Attends meetings of clubs or organizations: Not on file    Relationship status: Not on file  Other Topics Concern  . Not on file  Social History Narrative  . Not on file   Family History: Family History  Problem Relation Age of Onset  . Aneurysm Father   . AAA (abdominal aortic aneurysm) Father   . Depression Mother   . Cancer Mother        Pancreatic  . Thyroid disease Mother        hypothyroidism  . Varicose Veins Mother   . Drug abuse Sister   . Hyperlipidemia Brother   . Diabetes Maternal Grandmother   . Alcohol abuse Maternal Uncle    Allergies: Allergies  Allergen Reactions  . Bee Venom Anaphylaxis   Medications: See med rec.  Review of Systems: No fevers, chills, night sweats, weight loss, chest pain, or shortness of breath.   Objective:    General: Well  Developed, well nourished, and in no acute distress.  Neuro: Alert and oriented x3, extra-ocular muscles intact, sensation grossly intact.  HEENT: Normocephalic, atraumatic, pupils equal round reactive to light, neck supple, no masses, no lymphadenopathy, thyroid nonpalpable.  Skin: Warm and dry, no rashes. Cardiac: Regular rate and rhythm, no murmurs rubs or gallops, no lower extremity edema.  Respiratory: Clear to auscultation bilaterally. Not using accessory muscles, speaking in full sentences.  Procedure: Real-time Ultrasound Guided left median nerve hydrodissection Device: GE Logiq E  Verbal informed consent obtained.  Time-out conducted.  Noted no overlying erythema, induration, or other signs of local infection.  Skin prepped in a sterile fashion.  Local anesthesia: Topical Ethyl chloride.  With sterile technique and under real time ultrasound guidance: Using  a 25-gauge needle advanced into the carpal tunnel, injected medication both superficial to and deep to the median nerve freeing it from surrounding structures, the needle was redirected and I injected medication deeper in the flexor tendons for a total of 1 cc kenalog 40, 5 cc lidocaine.  Care was taken to avoid intraneural injection. Completed without difficulty  Pain immediately resolved suggesting accurate placement of the medication.  Advised to call if fevers/chills, erythema, induration, drainage, or persistent bleeding.  Images permanently stored and available for review in the ultrasound unit.  Impression: Technically successful ultrasound guided injection.  Impression and Recommendations:    Carpal tunnel syndrome, bilateral Did well after right-sided median nerve hydrodissection under ultrasound guidance in March, desires the left side today.  Obesity 6 pound weight loss, entering the sixth month. Refilling phentermine and full dose acarbose. ___________________________________________ [name]. [name], M.D., ABFM., CAQSM. Primary Care and [place] Instructor of [place] of [facility] of [facility]

## 2018-04-01 NOTE — Assessment & Plan Note (Signed)
Did well after right-sided median nerve hydrodissection under ultrasound guidance in March, desires the left side today.

## 2018-04-27 DIAGNOSIS — M47812 Spondylosis without myelopathy or radiculopathy, cervical region: Secondary | ICD-10-CM | POA: Diagnosis not present

## 2018-04-27 DIAGNOSIS — M961 Postlaminectomy syndrome, not elsewhere classified: Secondary | ICD-10-CM | POA: Diagnosis not present

## 2018-04-27 DIAGNOSIS — G894 Chronic pain syndrome: Secondary | ICD-10-CM | POA: Diagnosis not present

## 2018-04-29 ENCOUNTER — Encounter: Payer: Self-pay | Admitting: Sports Medicine

## 2018-04-29 ENCOUNTER — Ambulatory Visit (INDEPENDENT_AMBULATORY_CARE_PROVIDER_SITE_OTHER): Payer: 59 | Admitting: Sports Medicine

## 2018-04-29 DIAGNOSIS — E669 Obesity, unspecified: Secondary | ICD-10-CM

## 2018-04-29 MED ORDER — PHENTERMINE HCL 37.5 MG PO TABS: 18.7500 mg | ORAL_TABLET | Freq: Every day | ORAL | 0 refills | Status: DC

## 2018-04-29 MED ORDER — ACARBOSE 100 MG PO TABS: 100.0000 mg | ORAL_TABLET | Freq: Three times a day (TID) | ORAL | 0 refills | Status: DC

## 2018-04-29 MED ORDER — TOPIRAMATE 50 MG PO TABS
ORAL_TABLET | ORAL | 0 refills | Status: DC
Start: 2018-04-29 — End: 2018-05-29

## 2018-04-29 NOTE — Progress Notes (Signed)
Subjective:    CC: Weight check  HPI: Kelsey Snow returns, she is lost a good amount of weight after the 6th month on phentermine.  I reviewed the past medical history, family history, social history, surgical history, and allergies today and no changes were needed.  Please see the problem list section below in epic for further details.  Past Medical History: Past Medical History:  Diagnosis Date  . Anemia   . Essential hypertension, benign 03/07/2015  . GERD (gastroesophageal reflux disease)   . Hx of ectopic pregnancy   . Hyperlipidemia   . Lumbosacral spondylolysis   . Obese   . OCD (obsessive compulsive disorder)   . Thyroid disease    Past Surgical History: Past Surgical History:  Procedure Laterality Date  . BACK SURGERY    . ECTOPIC PREGNANCY SURGERY    . TUBAL LIGATION    . TUBAL LIGATION     Social History: Social History   Socioeconomic History  . Marital status: Married    Spouse name: Not on file  . Number of children: 1  . Years of education: 13  . Highest education level: Not on file  Occupational History    Employer: [employer]  Social Needs  . Financial resource strain: Not on file  . Food insecurity:    Worry: Not on file    Inability: Not on file  . Transportation needs:    Medical: Not on file    Non-medical: Not on file  Tobacco Use  . Smoking status: Never Smoker  . Smokeless tobacco: Never Used  Substance and Sexual Activity  . Alcohol use: Yes    Alcohol/week: 7.2 - 7.8 oz    Types: 6 - 7 Glasses of wine, 6 Standard drinks or equivalent per week  . Drug use: No  . Sexual activity: Not on file  Lifestyle  . Physical activity:    Days per week: Not on file    Minutes per session: Not on file  . Stress: Not on file  Relationships  . Social connections:    Talks on phone: Not on file    Gets together: Not on file    Attends religious service: Not on file    Active member of club or organization: Not on file    Attends  meetings of clubs or organizations: Not on file    Relationship status: Not on file  Other Topics Concern  . Not on file  Social History Narrative  . Not on file   Family History: Family History  Problem Relation Age of Onset  . Aneurysm Father   . AAA (abdominal aortic aneurysm) Father   . Depression Mother   . Cancer Mother        Pancreatic  . Thyroid disease Mother        hypothyroidism  . Varicose Veins Mother   . Drug abuse Sister   . Hyperlipidemia Brother   . Diabetes Maternal Grandmother   . Alcohol abuse Maternal Uncle    Allergies: Allergies  Allergen Reactions  . Bee Venom Anaphylaxis   Medications: See med rec.  Review of Systems: No fevers, chills, night sweats, weight loss, chest pain, or shortness of breath.   Objective:    General: Well Developed, well nourished, and in no acute distress.  Neuro: Alert and oriented x3, extra-ocular muscles intact, sensation grossly intact.  HEENT: Normocephalic, atraumatic, pupils equal round reactive to light, neck supple, no masses, no lymphadenopathy, thyroid nonpalpable.  Skin: Warm and  dry, no rashes. Cardiac: Regular rate and rhythm, no murmurs rubs or gallops, no lower extremity edema.  Respiratory: Clear to auscultation bilaterally. Not using accessory muscles, speaking in full sentences.  Impression and Recommendations:    Obesity Good additional weight loss, we are now entering the seventh month, dropping to a half dose phentermine, we will do 3 more months and a half dose and then stop. Continue acarbose, adding Topamax. Return in 1 month, she is still planning on sleeve gastrectomy. ___________________________________________ [name]. [name], M.D., ABFM., CAQSM. Primary Care and [place] Instructor of [place] of [facility] of Medicine

## 2018-04-29 NOTE — Assessment & Plan Note (Signed)
Good additional weight loss, we are now entering the seventh month, dropping to a half dose phentermine, we will do 3 more months and a half dose and then stop. Continue acarbose, adding Topamax. Return in 1 month, she is still planning on sleeve gastrectomy.

## 2018-05-29 ENCOUNTER — Other Ambulatory Visit: Payer: Self-pay | Admitting: Sports Medicine

## 2018-05-29 DIAGNOSIS — E669 Obesity, unspecified: Secondary | ICD-10-CM

## 2018-05-30 ENCOUNTER — Encounter: Payer: Self-pay | Admitting: Sports Medicine

## 2018-06-03 ENCOUNTER — Encounter: Payer: Self-pay | Admitting: Sports Medicine

## 2018-06-03 ENCOUNTER — Ambulatory Visit (INDEPENDENT_AMBULATORY_CARE_PROVIDER_SITE_OTHER): Payer: 59 | Admitting: Sports Medicine

## 2018-06-03 DIAGNOSIS — E669 Obesity, unspecified: Secondary | ICD-10-CM

## 2018-06-03 DIAGNOSIS — L723 Sebaceous cyst: Secondary | ICD-10-CM | POA: Diagnosis not present

## 2018-06-03 DIAGNOSIS — Z0000 Encounter for general adult medical examination without abnormal findings: Secondary | ICD-10-CM

## 2018-06-03 MED ORDER — DOXYCYCLINE HYCLATE 100 MG PO TABS: 100.0000 mg | ORAL_TABLET | Freq: Two times a day (BID) | ORAL | 0 refills | Status: AC

## 2018-06-03 NOTE — Assessment & Plan Note (Signed)
Due for mammogram.

## 2018-06-03 NOTE — Patient Instructions (Signed)
Epidermal Cyst An epidermal cyst is sometimes called an epidermal inclusion cyst or an infundibular cyst. It is a sac made of skin tissue. The sac contains a substance called keratin. Keratin is a protein that is normally secreted through the hair follicles. When keratin becomes trapped in the top layer of skin (epidermis), it can form an epidermal cyst. Epidermal cysts are usually found on the face, neck, trunk, and genitals. These cysts are usually harmless (benign), and they may not cause symptoms unless they become infected. It is important not to pop epidermal cysts yourself. What are the causes? This condition may be caused by:  A blocked hair follicle.  A hair that curls and re-enters the skin instead of growing straight out of the skin (ingrown hair).  A blocked pore.  Irritated skin.  An injury to the skin.  Certain conditions that are passed along from parent to child (inherited).  Human papillomavirus (HPV).  What increases the risk? The following factors may make you more likely to develop an epidermal cyst:  Having acne.  Being overweight.  Wearing tight clothing.  What are the signs or symptoms? The only symptom of this condition may be a small, painless lump underneath the skin. When an epidermal cyst becomes infected, symptoms may include:  Redness.  Inflammation.  Tenderness.  Warmth.  Fever.  Keratin draining from the cyst. Keratin may look like a grayish-white, bad-smelling substance.  Pus draining from the cyst.  How is this diagnosed? This condition is diagnosed with a physical exam. In some cases, you may have a sample of tissue (biopsy) taken from your cyst to be examined under a microscope or tested for bacteria. You may be referred to a health care provider who specializes in skin care (dermatologist). How is this treated? In many cases, epidermal cysts go away on their own without treatment. If a cyst becomes infected, treatment may  include:  Opening and draining the cyst. After draining, minor surgery to remove the rest of the cyst may be done.  Antibiotic medicine to help prevent infection.  Injections of medicines (steroids) that help to reduce inflammation.  Surgery to remove the cyst. Surgery may be done if: ? The cyst becomes large. ? The cyst bothers you. ? There is a chance that the cyst could turn into cancer.  Follow these instructions at home:  Take over-the-counter and prescription medicines only as told by your health care provider.  If you were prescribed an antibiotic, use it as told by your health care provider. Do not stop using the antibiotic even if you start to feel better.  Keep the area around your cyst clean and dry.  Wear loose, dry clothing.  Do not try to pop your cyst.  Avoid touching your cyst.  Check your cyst every day for signs of infection.  Keep all follow-up visits as told by your health care provider. This is important. How is this prevented?  Wear clean, dry, clothing.  Avoid wearing tight clothing.  Keep your skin clean and dry. Shower or take baths every day.  Wash your body with a benzoyl peroxide wash when you shower or bathe. Contact a health care provider if:  Your cyst develops symptoms of infection.  Your condition is not improving or is getting worse.  You develop a cyst that looks different from other cysts you have had.  You have a fever. Get help right away if:  Redness spreads from the cyst into the surrounding area. This information is   not intended to replace advice given to you by your health care provider. Make sure you discuss any questions you have with your health care provider. Document Released: 09/05/2004 Document Revised: 06/03/2016 Document Reviewed: 08/07/2015 Elsevier Interactive Patient Education  2018 [place].   Epidermal Cyst Removal Epidermal cyst removal is a procedure to remove a sac of oily material that forms  under your skin (epidermal cyst). Epidermal cysts may also be called epidermoid cysts or keratin cysts. Normally, the skin secretes this oily material through a gland or a hair follicle. This type of cyst usually results when a skin gland or hair follicle becomes blocked. You may need this procedure if you have an epidermal cyst that becomes large, uncomfortable, or infected. Tell a health care provider about:  Any allergies you have.  All medicines you are taking, including vitamins, herbs, eye drops, creams, and over-the-counter medicines.  Any problems you or family members have had with anesthetic medicines.  Any blood disorders you have.  Any surgeries you have had.  Any medical conditions you have. What are the risks? Generally, this is a safe procedure. However, problems may occur, including:  Developing another cyst.  Bleeding.  Infection.  Scarring.  What happens before the procedure?  Ask your health care provider about: ? Changing or stopping your regular medicines. This is especially important if you are taking diabetes medicines or blood thinners. ? Taking medicines such as aspirin and ibuprofen. These medicines can thin your blood. Do not take these medicines before your procedure if your health care provider instructs you not to.  If you have an infected cyst, you may have to take antibiotic medicines before or after the cyst removal. Take your antibiotics as directed by your health care provider. Finish all of the medicine even if you start to feel better.  Take a shower on the morning of your procedure. Your health care provider may ask you to use a germ-killing (antiseptic) soap. What happens during the procedure?  You will be given a medicine that numbs the area (local anesthetic).  The skin around the cyst will be cleaned with a germ-killing solution (antiseptic).  Your health care provider will make a small surgical incision over the cyst.  The cyst  will be separated from the surrounding tissues that are under your skin.  If possible, the cyst will be removed undamaged (intact).  If the cyst bursts (ruptures), it will need to be removed in pieces.  After the cyst is removed, your health care provider will control any bleeding and close the incision with small stitches (sutures). Small incisions may not need sutures, and the bleeding will be controlled by applying direct pressure with gauze.  Your health care provider may apply antibiotic ointment and a light bandage (dressing) over the incision. This procedure may vary among health care providers and hospitals. What happens after the procedure?  If your cyst ruptured during surgery, you may need to take antibiotic medicine. If you were prescribed an antibiotic medicine, finish all of it even if you start to feel better. This information is not intended to replace advice given to you by your health care provider. Make sure you discuss any questions you have with your health care provider. Document Released: 10/02/2000 Document Revised: 03/12/2016 Document Reviewed: 06/20/2014 Elsevier Interactive Patient Education  2018 [employer].

## 2018-06-03 NOTE — Assessment & Plan Note (Signed)
Good continued weight loss, she does desire to stop phentermine and just use acarbose. Did not tolerate Topamax

## 2018-06-03 NOTE — Assessment & Plan Note (Signed)
Right flank under her bra strap. Doxycycline to calm this down then return in about a week and a half for surgical excision in a 30-minute slot.

## 2018-06-03 NOTE — Progress Notes (Signed)
Subjective:    CC: Multiple issues  HPI: Obesity: Good additional weight loss, we have finished 6 months now weight loss medication, she has not even been taking her phentermine, just the acarbose.  She was unable to tolerate Topamax.  Skin lesion: Right sided flank, under the bra strap.  Somewhat painful, hot and red.  I reviewed the past medical history, family history, social history, surgical history, and allergies today and no changes were needed.  Please see the problem list section below in epic for further details.  Past Medical History: Past Medical History:  Diagnosis Date  . Anemia   . Essential hypertension, benign 03/07/2015  . GERD (gastroesophageal reflux disease)   . Hx of ectopic pregnancy   . Hyperlipidemia   . Lumbosacral spondylolysis   . Obese   . OCD (obsessive compulsive disorder)   . Thyroid disease    Past Surgical History: Past Surgical History:  Procedure Laterality Date  . BACK SURGERY    . ECTOPIC PREGNANCY SURGERY    . TUBAL LIGATION    . TUBAL LIGATION     Social History: Social History   Socioeconomic History  . Marital status: Married    Spouse name: Not on file  . Number of children: 1  . Years of education: 69  . Highest education level: Not on file  Occupational History    Employer: [employer]  Social Needs  . Financial resource strain: Not on file  . Food insecurity:    Worry: Not on file    Inability: Not on file  . Transportation needs:    Medical: Not on file    Non-medical: Not on file  Tobacco Use  . Smoking status: Never Smoker  . Smokeless tobacco: Never Used  Substance and Sexual Activity  . Alcohol use: Yes    Alcohol/week: 12.0 - 13.0 standard drinks    Types: 6 - 7 Glasses of wine, 6 Standard drinks or equivalent per week  . Drug use: No  . Sexual activity: Not on file  Lifestyle  . Physical activity:    Days per week: Not on file    Minutes per session: Not on file  . Stress: Not on file    Relationships  . Social connections:    Talks on phone: Not on file    Gets together: Not on file    Attends religious service: Not on file    Active member of club or organization: Not on file    Attends meetings of clubs or organizations: Not on file    Relationship status: Not on file  Other Topics Concern  . Not on file  Social History Narrative  . Not on file   Family History: Family History  Problem Relation Age of Onset  . Aneurysm Father   . AAA (abdominal aortic aneurysm) Father   . Depression Mother   . Cancer Mother        Pancreatic  . Thyroid disease Mother        hypothyroidism  . Varicose Veins Mother   . Drug abuse Sister   . Hyperlipidemia Brother   . Diabetes Maternal Grandmother   . Alcohol abuse Maternal Uncle    Allergies: Allergies  Allergen Reactions  . Bee Venom Anaphylaxis  . Topiramate Other (See Comments)   Medications: See med rec.  Review of Systems: No fevers, chills, night sweats, weight loss, chest pain, or shortness of breath.   Objective:    General: Well Developed, well  nourished, and in no acute distress.  Neuro: Alert and oriented x3, extra-ocular muscles intact, sensation grossly intact.  HEENT: Normocephalic, atraumatic, pupils equal round reactive to light, neck supple, no masses, no lymphadenopathy, thyroid nonpalpable.  Skin: Warm and dry, no rashes.  2 cm palpable sebaceous cyst on the right flank, minimal overlying erythema without drainage. Cardiac: Regular rate and rhythm, no murmurs rubs or gallops, no lower extremity edema.  Respiratory: Clear to auscultation bilaterally. Not using accessory muscles, speaking in full sentences.  Impression and Recommendations:    Obesity Good continued weight loss, she does desire to stop phentermine and just use acarbose. Did not tolerate Topamax  Sebaceous cyst Right flank under her bra strap. Doxycycline to calm this down then return in about a week and a half for surgical  excision in a 30-minute slot.  Annual physical exam Due for mammogram. ___________________________________________ [name]. [name], M.D., ABFM., CAQSM. Primary Care and [place] Instructor of [place] of [facility] of Medicine

## 2018-06-08 ENCOUNTER — Ambulatory Visit (INDEPENDENT_AMBULATORY_CARE_PROVIDER_SITE_OTHER): Payer: Medicare Other

## 2018-06-08 DIAGNOSIS — Z1231 Encounter for screening mammogram for malignant neoplasm of breast: Secondary | ICD-10-CM | POA: Diagnosis not present

## 2018-06-09 ENCOUNTER — Ambulatory Visit (INDEPENDENT_AMBULATORY_CARE_PROVIDER_SITE_OTHER): Payer: 59 | Admitting: Sports Medicine

## 2018-06-09 ENCOUNTER — Encounter: Payer: Self-pay | Admitting: Sports Medicine

## 2018-06-09 ENCOUNTER — Ambulatory Visit: Payer: Medicare Other | Admitting: Sports Medicine

## 2018-06-09 DIAGNOSIS — G5603 Carpal tunnel syndrome, bilateral upper limbs: Secondary | ICD-10-CM

## 2018-06-09 NOTE — Assessment & Plan Note (Signed)
Last right-sided median nerve hydrodissection was in March 2019, the left side was done in June 2019. Repeat right sided median nerve hydrodissection today.

## 2018-06-09 NOTE — Progress Notes (Signed)
Subjective:    CC: Right hand numbness  HPI: This is a pleasant 52 year old female with bilateral carpal tunnel syndrome, her last median nerve procedure on the right was in March 2019, now having a recurrence of paresthesias into the first 4 fingers, moderate, persistent.  Localized.  No trauma, constitutional symptoms.  I reviewed the past medical history, family history, social history, surgical history, and allergies today and no changes were needed.  Please see the problem list section below in epic for further details.  Past Medical History: Past Medical History:  Diagnosis Date  . Anemia   . Essential hypertension, benign 03/07/2015  . GERD (gastroesophageal reflux disease)   . Hx of ectopic pregnancy   . Hyperlipidemia   . Lumbosacral spondylolysis   . Obese   . OCD (obsessive compulsive disorder)   . Thyroid disease    Past Surgical History: Past Surgical History:  Procedure Laterality Date  . BACK SURGERY    . ECTOPIC PREGNANCY SURGERY    . TUBAL LIGATION    . TUBAL LIGATION     Social History: Social History   Socioeconomic History  . Marital status: Married    Spouse name: Not on file  . Number of children: 1  . Years of education: 57  . Highest education level: Not on file  Occupational History    Employer: [employer]  Social Needs  . Financial resource strain: Not on file  . Food insecurity:    Worry: Not on file    Inability: Not on file  . Transportation needs:    Medical: Not on file    Non-medical: Not on file  Tobacco Use  . Smoking status: Never Smoker  . Smokeless tobacco: Never Used  Substance and Sexual Activity  . Alcohol use: Yes    Alcohol/week: 12.0 - 13.0 standard drinks    Types: 6 - 7 Glasses of wine, 6 Standard drinks or equivalent per week  . Drug use: No  . Sexual activity: Not on file  Lifestyle  . Physical activity:    Days per week: Not on file    Minutes per session: Not on file  . Stress: Not on file    Relationships  . Social connections:    Talks on phone: Not on file    Gets together: Not on file    Attends religious service: Not on file    Active member of club or organization: Not on file    Attends meetings of clubs or organizations: Not on file    Relationship status: Not on file  Other Topics Concern  . Not on file  Social History Narrative  . Not on file   Family History: Family History  Problem Relation Age of Onset  . Aneurysm Father   . AAA (abdominal aortic aneurysm) Father   . Depression Mother   . Cancer Mother        Pancreatic  . Thyroid disease Mother        hypothyroidism  . Varicose Veins Mother   . Drug abuse Sister   . Hyperlipidemia Brother   . Diabetes Maternal Grandmother   . Alcohol abuse Maternal Uncle    Allergies: Allergies  Allergen Reactions  . Bee Venom Anaphylaxis  . Topiramate Other (See Comments)   Medications: See med rec.  Review of Systems: No fevers, chills, night sweats, weight loss, chest pain, or shortness of breath.   Objective:    General: Well Developed, well nourished, and in no  acute distress.  Neuro: Alert and oriented x3, extra-ocular muscles intact, sensation grossly intact.  HEENT: Normocephalic, atraumatic, pupils equal round reactive to light, neck supple, no masses, no lymphadenopathy, thyroid nonpalpable.  Skin: Warm and dry, no rashes. Cardiac: Regular rate and rhythm, no murmurs rubs or gallops, no lower extremity edema.  Respiratory: Clear to auscultation bilaterally. Not using accessory muscles, speaking in full sentences. Right wrist: Inspection normal with no visible erythema or swelling. ROM smooth and normal with good flexion and extension and ulnar/radial deviation that is symmetrical with opposite wrist. Palpation is normal over metacarpals, navicular, lunate, and TFCC; tendons without tenderness/ swelling No snuffbox tenderness. No tenderness over Canal of Guyon. Strength 5/5 in all directions  without pain. Positive Tinel's and phalens signs. Negative Finkelstein sign. Negative Watson's test.  Procedure: Real-time Ultrasound Guided hydrodissection of the median nerve at the right carpal tunnel Device: GE Logiq E  Verbal informed consent obtained.  Time-out conducted.  Noted no overlying erythema, induration, or other signs of local infection.  Skin prepped in a sterile fashion.  Local anesthesia: Topical Ethyl chloride.  With sterile technique and under real time ultrasound guidance: Using a 25-gauge needle advanced into the carpal tunnel, taking care to avoid intraneural injection I injected medication both superficial to and deep to the median nerve freeing it from surrounding structures, I then redirected the needle deep and injected further medication around the flexor tendons deep within the carpal tunnel for a total of 1 cc kenalog 40, 5 cc lidocaine. Completed without difficulty  Advised to call if fevers/chills, erythema, induration, drainage, or persistent bleeding.  Images permanently stored and available for review in the ultrasound unit.  Impression: Technically successful ultrasound guided median nerve hydrodissection.  Impression and Recommendations:    Carpal tunnel syndrome, bilateral Last right-sided median nerve hydrodissection was in March 2019, the left side was done in June 2019. Repeat right sided median nerve hydrodissection today. ___________________________________________ [name]. [name], M.D., ABFM., CAQSM. Primary Care and [place] Instructor of [place] of [facility] of Medicine

## 2018-06-14 ENCOUNTER — Encounter: Payer: Self-pay | Admitting: Sports Medicine

## 2018-06-15 ENCOUNTER — Ambulatory Visit: Payer: Managed Care, Other (non HMO) | Admitting: Sports Medicine

## 2018-06-17 ENCOUNTER — Encounter: Payer: Self-pay | Admitting: Sports Medicine

## 2018-06-24 ENCOUNTER — Ambulatory Visit (INDEPENDENT_AMBULATORY_CARE_PROVIDER_SITE_OTHER): Payer: 59 | Admitting: Sports Medicine

## 2018-06-24 DIAGNOSIS — L723 Sebaceous cyst: Secondary | ICD-10-CM

## 2018-06-24 NOTE — Progress Notes (Signed)
   Procedure:  Excision of 2.5 cm right upper back sebaceous cyst Risks, benefits, and alternatives explained and consent obtained. Time out conducted. Surface prepped with alcohol. 10cc lidocaine with epinephine infiltrated in a field block. Adequate anesthesia ensured. Area prepped and draped in a sterile fashion. Excision performed with: Using a #15 blade I made a linear incision, then using both sharp and blunt dissection I carried the procedure around the sebaceous cyst, I removed it in a piecemeal fashion as it had ruptured, all of the cyst capsule was removed including some normal subcutaneous tissues, I then closed the incision with #5 3-0 simple interrupted Ethilon sutures. Hemostasis achieved. Pt stable.

## 2018-06-24 NOTE — Assessment & Plan Note (Signed)
Surgical excision of right upper back sebaceous cyst. Return to see me in a week for suture removal. Patient does have analgesics for pain control.

## 2018-06-26 ENCOUNTER — Other Ambulatory Visit: Payer: Self-pay | Admitting: Sports Medicine

## 2018-06-26 DIAGNOSIS — E669 Obesity, unspecified: Secondary | ICD-10-CM

## 2018-06-27 ENCOUNTER — Other Ambulatory Visit: Payer: Self-pay | Admitting: *Deleted

## 2018-06-27 ENCOUNTER — Other Ambulatory Visit: Payer: Self-pay | Admitting: Sports Medicine

## 2018-06-27 DIAGNOSIS — E669 Obesity, unspecified: Secondary | ICD-10-CM

## 2018-06-27 MED ORDER — ACARBOSE 100 MG PO TABS: 100.0000 mg | ORAL_TABLET | Freq: Three times a day (TID) | ORAL | 3 refills | Status: DC

## 2018-06-30 ENCOUNTER — Ambulatory Visit: Payer: Medicare Other | Admitting: Sports Medicine

## 2018-07-01 ENCOUNTER — Encounter: Payer: Self-pay | Admitting: Sports Medicine

## 2018-07-01 ENCOUNTER — Ambulatory Visit (INDEPENDENT_AMBULATORY_CARE_PROVIDER_SITE_OTHER): Payer: 59 | Admitting: Sports Medicine

## 2018-07-01 VITALS — BP 122/76 | HR 67 | Ht 65.0 in | Wt 220.0 lb

## 2018-07-01 DIAGNOSIS — Z23 Encounter for immunization: Secondary | ICD-10-CM | POA: Diagnosis not present

## 2018-07-01 DIAGNOSIS — L723 Sebaceous cyst: Secondary | ICD-10-CM

## 2018-07-01 NOTE — Progress Notes (Signed)
  Subjective: 7 days post surgical excision of her right upper back sebaceous cyst, doing well.  She is back early, going on a trip for the next week and so needed sutures removed early.  Objective: General: Well-developed, well-nourished, and in no acute distress. Incision: Clean, dry, intact.  Sutures removed.  The wound is not completely yet ready so I applied benzoin, Steri-Strips and coated this all with Dermabond to reinforce the wound.  Assessment/plan:   Sebaceous cyst Sutures removed.  The wound is not completely yet ready so I applied benzoin, Steri-Strips and coated this all with Dermabond to reinforce the wound. ___________________________________________ [name]. [name], M.D., ABFM., CAQSM. Primary Care and [place] Instructor of [place] of [facility] of Medicine

## 2018-07-01 NOTE — Assessment & Plan Note (Signed)
Sutures removed.  The wound is not completely yet ready so I applied benzoin, Steri-Strips and coated this all with Dermabond to reinforce the wound.

## 2018-07-28 DIAGNOSIS — M961 Postlaminectomy syndrome, not elsewhere classified: Secondary | ICD-10-CM | POA: Diagnosis not present

## 2018-07-28 DIAGNOSIS — M533 Sacrococcygeal disorders, not elsewhere classified: Secondary | ICD-10-CM | POA: Insufficient documentation

## 2018-07-28 DIAGNOSIS — M47816 Spondylosis without myelopathy or radiculopathy, lumbar region: Secondary | ICD-10-CM | POA: Diagnosis not present

## 2018-08-02 ENCOUNTER — Ambulatory Visit: Payer: 59 | Admitting: Sports Medicine

## 2018-08-08 ENCOUNTER — Ambulatory Visit: Payer: 59 | Admitting: Sports Medicine

## 2018-08-17 DIAGNOSIS — Z01419 Encounter for gynecological examination (general) (routine) without abnormal findings: Secondary | ICD-10-CM | POA: Diagnosis not present

## 2018-08-17 DIAGNOSIS — Z124 Encounter for screening for malignant neoplasm of cervix: Secondary | ICD-10-CM | POA: Diagnosis not present

## 2018-09-06 ENCOUNTER — Ambulatory Visit (INDEPENDENT_AMBULATORY_CARE_PROVIDER_SITE_OTHER): Payer: 59 | Admitting: Sports Medicine

## 2018-09-06 DIAGNOSIS — G5603 Carpal tunnel syndrome, bilateral upper limbs: Secondary | ICD-10-CM | POA: Diagnosis not present

## 2018-09-06 NOTE — Assessment & Plan Note (Signed)
Right median nerve hydrodissection, return as needed. Previous median nerve hydrodissection on the right was 3 months ago, the left side was last done in June 2019. Return as needed. She will increase compliance with splinting.

## 2018-09-06 NOTE — Progress Notes (Signed)
Subjective:    CC: Wrist numbness and tingling  HPI: This is a 52 year old female with known carpal tunnel syndrome, previous hydrodissection on the left was many months ago, on the right 3 months ago with a recurrence of discomfort.  Moderate, persistent, localized without radiation.  I reviewed the past medical history, family history, social history, surgical history, and allergies today and no changes were needed.  Please see the problem list section below in epic for further details.  Past Medical History: Past Medical History:  Diagnosis Date  . Anemia   . Essential hypertension, benign 03/07/2015  . GERD (gastroesophageal reflux disease)   . Hx of ectopic pregnancy   . Hyperlipidemia   . Lumbosacral spondylolysis   . Obese   . OCD (obsessive compulsive disorder)   . Thyroid disease    Past Surgical History: Past Surgical History:  Procedure Laterality Date  . BACK SURGERY    . ECTOPIC PREGNANCY SURGERY    . TUBAL LIGATION    . TUBAL LIGATION     Social History: Social History   Socioeconomic History  . Marital status: Married    Spouse name: Not on file  . Number of children: 1  . Years of education: 49  . Highest education level: Not on file  Occupational History    Employer: [employer]  Social Needs  . Financial resource strain: Not on file  . Food insecurity:    Worry: Not on file    Inability: Not on file  . Transportation needs:    Medical: Not on file    Non-medical: Not on file  Tobacco Use  . Smoking status: Never Smoker  . Smokeless tobacco: Never Used  Substance and Sexual Activity  . Alcohol use: Yes    Alcohol/week: 12.0 - 13.0 standard drinks    Types: 6 - 7 Glasses of wine, 6 Standard drinks or equivalent per week  . Drug use: No  . Sexual activity: Not on file  Lifestyle  . Physical activity:    Days per week: Not on file    Minutes per session: Not on file  . Stress: Not on file  Relationships  . Social connections:   Talks on phone: Not on file    Gets together: Not on file    Attends religious service: Not on file    Active member of club or organization: Not on file    Attends meetings of clubs or organizations: Not on file    Relationship status: Not on file  Other Topics Concern  . Not on file  Social History Narrative  . Not on file   Family History: Family History  Problem Relation Age of Onset  . Aneurysm Father   . AAA (abdominal aortic aneurysm) Father   . Depression Mother   . Cancer Mother        Pancreatic  . Thyroid disease Mother        hypothyroidism  . Varicose Veins Mother   . Drug abuse Sister   . Hyperlipidemia Brother   . Diabetes Maternal Grandmother   . Alcohol abuse Maternal Uncle    Allergies: Allergies  Allergen Reactions  . Bee Venom Anaphylaxis  . Topiramate Other (See Comments)   Medications: See med rec.  Review of Systems: No fevers, chills, night sweats, weight loss, chest pain, or shortness of breath.   Objective:    General: Well Developed, well nourished, and in no acute distress.  Neuro: Alert and oriented x3, extra-ocular  muscles intact, sensation grossly intact.  HEENT: Normocephalic, atraumatic, pupils equal round reactive to light, neck supple, no masses, no lymphadenopathy, thyroid nonpalpable.  Skin: Warm and dry, no rashes. Cardiac: Regular rate and rhythm, no murmurs rubs or gallops, no lower extremity edema.  Respiratory: Clear to auscultation bilaterally. Not using accessory muscles, speaking in full sentences. Right wrist: Inspection normal with no visible erythema or swelling. ROM smooth and normal with good flexion and extension and ulnar/radial deviation that is symmetrical with opposite wrist. Palpation is normal over metacarpals, navicular, lunate, and TFCC; tendons without tenderness/ swelling No snuffbox tenderness. No tenderness over Canal of Guyon. Strength 5/5 in all directions without pain. Positive Tinel's and phalens  signs. Negative Finkelstein sign. Negative Watson's test.  Procedure: Real-time Ultrasound Guided hydrodissection of the right median nerve at the carpal tunnel Device: GE Logiq E  Verbal informed consent obtained.  Time-out conducted.  Noted no overlying erythema, induration, or other signs of local infection.  Skin prepped in a sterile fashion.  Local anesthesia: Topical Ethyl chloride.  With sterile technique and under real time ultrasound guidance: Using a 25-gauge needle advanced into the carpal tunnel, taking care to avoid intraneural injection I injected medication both superficial to and deep to the median nerve freeing it from surrounding structures, I then redirected the needle deep and injected further medication around the flexor tendons deep within the carpal tunnel for a total of 1 cc kenalog 40, 5 cc lidocaine. Completed without difficulty  Advised to call if fevers/chills, erythema, induration, drainage, or persistent bleeding.  Images permanently stored and available for review in the ultrasound unit.  Impression: Technically successful ultrasound guided median nerve hydrodissection.  Impression and Recommendations:    Carpal tunnel syndrome, bilateral Right median nerve hydrodissection, return as needed. Previous median nerve hydrodissection on the right was 3 months ago, the left side was last done in June 2019. Return as needed. She will increase compliance with splinting. ___________________________________________ [name]. [name], M.D., ABFM., CAQSM. Primary Care and Sports Medicine [facility]  Adjunct Professor of [place] of [facility] of Medicine

## 2018-10-27 DIAGNOSIS — M961 Postlaminectomy syndrome, not elsewhere classified: Secondary | ICD-10-CM | POA: Diagnosis not present

## 2018-10-27 DIAGNOSIS — M47812 Spondylosis without myelopathy or radiculopathy, cervical region: Secondary | ICD-10-CM | POA: Diagnosis not present

## 2018-10-27 DIAGNOSIS — G894 Chronic pain syndrome: Secondary | ICD-10-CM | POA: Diagnosis not present

## 2018-10-27 DIAGNOSIS — M47816 Spondylosis without myelopathy or radiculopathy, lumbar region: Secondary | ICD-10-CM | POA: Diagnosis not present

## 2018-12-26 ENCOUNTER — Other Ambulatory Visit: Payer: Self-pay

## 2018-12-26 DIAGNOSIS — I7771 Dissection of carotid artery: Secondary | ICD-10-CM

## 2018-12-26 DIAGNOSIS — I773 Arterial fibromuscular dysplasia: Secondary | ICD-10-CM

## 2019-01-02 ENCOUNTER — Encounter: Payer: Self-pay | Admitting: Family

## 2019-01-02 ENCOUNTER — Other Ambulatory Visit: Payer: Self-pay

## 2019-01-02 ENCOUNTER — Ambulatory Visit (INDEPENDENT_AMBULATORY_CARE_PROVIDER_SITE_OTHER): Payer: 59 | Admitting: Family

## 2019-01-02 ENCOUNTER — Ambulatory Visit (HOSPITAL_COMMUNITY)
Admission: RE | Admit: 2019-01-02 | Discharge: 2019-01-02 | Disposition: A | Discharge status: Home / Self Care | Payer: 59 | Source: Ambulatory Visit | Attending: Family | Admitting: Family

## 2019-01-02 VITALS — BP 151/74 | HR 56 | Temp 98.1°F | Resp 14 | Ht 64.0 in | Wt 250.8 lb

## 2019-01-02 DIAGNOSIS — I7771 Dissection of carotid artery: Secondary | ICD-10-CM | POA: Diagnosis not present

## 2019-01-02 DIAGNOSIS — I773 Arterial fibromuscular dysplasia: Secondary | ICD-10-CM | POA: Diagnosis not present

## 2019-01-02 NOTE — Progress Notes (Signed)
Chief Complaint: Follow up Extracranial Carotid Artery Stenosis   History of Present Illness  Kelsey Snow is a 53 y.o. female whom Dr. Bridgett Larsson had been monitoring for extracranial carotid artery stenosis and dissectipon.  Previous carotid studies demonstrated: RICA <16% stenosis, LICA resolved dissection. Patienthas nothad previous carotid artery intervention. She denies any known history of stroke or TIA. Specifically she deniesa history of amaurosis fugax or monocular blindness, unilateral facial drooping, hemiplegia, orreceptive or expressive aphasia.  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 The carotid dissection was found on imaging related to this.  She receives physical therapy when needed for c-spine issues causing burning and pain at her trapezius area, and pain and numbness in her eft leg.  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  Diabetic: no Tobacco use: non-smoker  Pt meds include: Statin : yes ASA: yes Other anticoagulants/antiplatelets: no   Past Medical History:  Diagnosis Date  . Anemia   . Essential hypertension, benign 03/07/2015  . GERD (gastroesophageal reflux disease)   . Hx of ectopic pregnancy   . Hyperlipidemia   . Lumbosacral spondylolysis   . Obese   . OCD (obsessive compulsive disorder)   . Thyroid disease     Social History Social History   Tobacco Use  . Smoking status: Never Smoker  . Smokeless tobacco: Never Used  Substance Use Topics  . Alcohol use: Yes    Alcohol/week: 12.0 - 13.0 standard drinks    Types: 6 - 7 Glasses of wine, 6 Standard drinks or equivalent per week  . Drug use: No    Family History Family History  Problem Relation Age of Onset  . Aneurysm Father   . AAA (abdominal aortic aneurysm) Father   . Depression Mother   . Cancer Mother        Pancreatic  . Thyroid disease Mother        hypothyroidism  . Varicose Veins Mother   . Drug  abuse Sister   . Hyperlipidemia Brother   . Diabetes Maternal Grandmother   . Alcohol abuse Maternal Uncle     Surgical History Past Surgical History:  Procedure Laterality Date  . BACK SURGERY    . ECTOPIC PREGNANCY SURGERY    . TUBAL LIGATION    . TUBAL LIGATION      Allergies  Allergen Reactions  . Bee Venom Anaphylaxis  . Topiramate Other (See Comments)    Current Outpatient Medications  Medication Sig Dispense Refill  . aspirin 81 MG EC tablet TAKE 1 TABLET (81 MG TOTAL) BY MOUTH DAILY. 90 tablet 3  . Cholecalciferol (VITAMIN D3) 2000 UNITS TABS Take by mouth 2 (two) times a week. TAKE 2 TABLETS Sunday AND 1 TABLET Tuesday AND Thursday.    . escitalopram (LEXAPRO) 20 MG tablet Take 1 tablet (20 mg total) by mouth daily. 90 tablet 3  . levothyroxine (SYNTHROID, LEVOTHROID) 137 MCG tablet TAKE ONE TABLET BY MOUTH DAILY BEFORE BREAKFAST 90 tablet 3  . Mag Aspart-Potassium Aspart (POTASSIUM & MAGNESIUM ASPARTAT) 250-250 MG CAPS Take by mouth. TAKE 2 CAPSULES Tuesday  AND Thursday  MORNING    . omeprazole (PRILOSEC) 40 MG capsule Take 1 capsule (40 mg total) by mouth daily. 90 capsule 3  . oxyCODONE-acetaminophen (PERCOCET/ROXICET) 5-325 MG tablet Take by mouth.    . rosuvastatin (CRESTOR) 10 MG tablet Take 1 tablet (10 mg total) by mouth daily. 90 tablet 3  .  traMADol (ULTRAM-ER) 200 MG 24 hr tablet     . traZODone (DESYREL) 100 MG tablet Take 1 tablet (100 mg total) by mouth at bedtime. 90 tablet 3   No current facility-administered medications for this visit.     Review of Systems : See HPI for pertinent positives and negatives.  Physical Examination  Vitals:   01/02/19 0844  BP: (!) 151/74  Pulse: (!) 56  Resp: 14  Temp: 98.1 F (36.7 C)  TempSrc: Oral  SpO2: 99%  Weight: 250 lb 12.8 oz (113.8 kg)  Height: 5\' 4"  (1.626 m)   Body mass index is 43.05 kg/m.  General: WDWN morbidly obese female in NAD GAIT: normal Eyes: PERRLA HENT: No gross abnormalities.   Pulmonary:  Respirations are non-labored, good air movement in all fields, CTAB, no rales, rhonchi, or  wheezing. Cardiac: regular rhythm, no detected murmur.  VASCULAR EXAM Carotid Bruits Right Left   Negative Negative     Abdominal aortic pulse is not palpable. Radial pulses are 2+ palpable and equal.                                                                                                                            LE Pulses Right Left       POPLITEAL  not palpable   not palpable       POSTERIOR TIBIAL   palpable    palpable        DORSALIS PEDIS      ANTERIOR TIBIAL  palpable  palpable     Gastrointestinal: soft, nontender, BS WNL, no r/g,   palpable masses. Musculoskeletal: no muscle atrophy/wasting. M/S 5/5 throughout, extremities without ischemic changes. Skin: No rashes, no ulcers, no cellulitis.   Neurologic:  A&O X 3; appropriate affect, sensation is normal; speech is normal, CN 2-12 intact, pain and light touch intact in extremities, motor exam as listed above. Psychiatric: Normal thought content, mood appropriate to clinical situation.    Assessment: Kelsey Snow is a 53 y.o. 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  She has no history of stroke or TIA.   She states her blood pressure is likely elevated today due to increased back pain and states she was worried about her carotid arteries being more stenosed. Her blood pressure had remained in control on no antihypertensive medications.  Morbid obesity is possible her primary atherosclerotic risk factor: we discussed measures to address this.    DATA Carotid Duplex (12/31/17): 40-59% stenosis in bilateral ICA. Disease visualized in the left ICA, but is also tortuous.  Bilateral vertebral artery flow is antegrade.  Bilateral subclavian artery waveforms are normal.  Increased stenosis in bilateral ICA compared to the exam on 12-04-16, but comparable to the exam on 11-29-15.     DATA Carotid Duplex (01-02-28): Right Carotid: Velocities in the right ICA are consistent with a 40-59%  stenosis. Non-hemodynamically significant plaque <50% noted in                the CCA. Left Carotid: Velocities in the left ICA are consistent with a 40-59% stenosis.               The ECA appears <50% stenosed. Elevated velocities noted in               tortuous mid and distal segments, little to no plaque visualized. Vertebrals:  Bilateral vertebral arteries demonstrate antegrade flow. Subclavians: Normal flow hemodynamics were seen in bilateral subclavian              arteries. No change compared to the exam on 12-31-17.   Plan: Follow-up in 1 year with Carotid Duplex scan.   I discussed in depth with the patient the nature of atherosclerosis, and emphasized the importance of maximal medical management including strict control of blood pressure, blood glucose, and lipid levels, obtaining regular exercise, and continued cessation of smoking.  The patient is aware that without maximal medical management the underlying atherosclerotic disease process will progress, limiting the benefit of any interventions. The patient was given information about stroke prevention and what symptoms should prompt the patient to seek immediate medical care. Thank you for allowing [place] to participate in this patient's care.  Clemon Chambers, RN, MSN, FNP-C [facility] Office: [facility] Physician: Trula Slade  01/02/19 8:49 AM

## 2019-01-11 DIAGNOSIS — M47816 Spondylosis without myelopathy or radiculopathy, lumbar region: Secondary | ICD-10-CM | POA: Diagnosis not present

## 2019-01-11 DIAGNOSIS — M47812 Spondylosis without myelopathy or radiculopathy, cervical region: Secondary | ICD-10-CM | POA: Diagnosis not present

## 2019-01-12 ENCOUNTER — Other Ambulatory Visit: Payer: Self-pay

## 2019-01-12 ENCOUNTER — Ambulatory Visit (INDEPENDENT_AMBULATORY_CARE_PROVIDER_SITE_OTHER): Payer: 59 | Admitting: Sports Medicine

## 2019-01-12 ENCOUNTER — Other Ambulatory Visit: Payer: Self-pay | Admitting: Sports Medicine

## 2019-01-12 DIAGNOSIS — G5603 Carpal tunnel syndrome, bilateral upper limbs: Secondary | ICD-10-CM | POA: Diagnosis not present

## 2019-01-12 MED ORDER — TRAZODONE HCL 100 MG PO TABS: 100.0000 mg | ORAL_TABLET | Freq: Every day | ORAL | 3 refills | Status: DC

## 2019-01-12 NOTE — Progress Notes (Signed)
Subjective:    CC: Bilateral hand numbness and tingling  HPI: This is a pleasant 53 year old female, she has bilateral carpal tunnel syndrome.  Previous injections were many months ago.  She is having recurrence of symptoms, paresthesias into both hands, pain of the wrist, and the forearm worse at night.  Severe, worsening, localized.  Desires repeat bilateral interventional treatment today.  I reviewed the past medical history, family history, social history, surgical history, and allergies today and no changes were needed.  Please see the problem list section below in epic for further details.  Past Medical History: Past Medical History:  Diagnosis Date  . Anemia   . Essential hypertension, benign 03/07/2015  . GERD (gastroesophageal reflux disease)   . Hx of ectopic pregnancy   . Hyperlipidemia   . Lumbosacral spondylolysis   . Obese   . OCD (obsessive compulsive disorder)   . Thyroid disease    Past Surgical History: Past Surgical History:  Procedure Laterality Date  . BACK SURGERY    . ECTOPIC PREGNANCY SURGERY    . TUBAL LIGATION    . TUBAL LIGATION     Social History: Social History   Socioeconomic History  . Marital status: Married    Spouse name: Not on file  . Number of children: 1  . Years of education: 29  . Highest education level: Not on file  Occupational History    Employer: [employer]  Social Needs  . Financial resource strain: Not on file  . Food insecurity:    Worry: Not on file    Inability: Not on file  . Transportation needs:    Medical: Not on file    Non-medical: Not on file  Tobacco Use  . Smoking status: Never Smoker  . Smokeless tobacco: Never Used  Substance and Sexual Activity  . Alcohol use: Yes    Alcohol/week: 12.0 - 13.0 standard drinks    Types: 6 - 7 Glasses of wine, 6 Standard drinks or equivalent per week  . Drug use: No  . Sexual activity: Not on file  Lifestyle  . Physical activity:    Days per week: Not on  file    Minutes per session: Not on file  . Stress: Not on file  Relationships  . Social connections:    Talks on phone: Not on file    Gets together: Not on file    Attends religious service: Not on file    Active member of club or organization: Not on file    Attends meetings of clubs or organizations: Not on file    Relationship status: Not on file  Other Topics Concern  . Not on file  Social History Narrative  . Not on file   Family History: Family History  Problem Relation Age of Onset  . Aneurysm Father   . AAA (abdominal aortic aneurysm) Father   . Depression Mother   . Cancer Mother        Pancreatic  . Thyroid disease Mother        hypothyroidism  . Varicose Veins Mother   . Drug abuse Sister   . Hyperlipidemia Brother   . Diabetes Maternal Grandmother   . Alcohol abuse Maternal Uncle    Allergies: Allergies  Allergen Reactions  . Bee Venom Anaphylaxis  . Topiramate Other (See Comments)   Medications: See med rec.  Review of Systems: No fevers, chills, night sweats, weight loss, chest pain, or shortness of breath.   Objective:  General: Well Developed, well nourished, and in no acute distress.  Neuro: Alert and oriented x3, extra-ocular muscles intact, sensation grossly intact.  HEENT: Normocephalic, atraumatic, pupils equal round reactive to light, neck supple, no masses, no lymphadenopathy, thyroid nonpalpable.  Skin: Warm and dry, no rashes. Cardiac: Regular rate and rhythm, no murmurs rubs or gallops, no lower extremity edema.  Respiratory: Clear to auscultation bilaterally. Not using accessory muscles, speaking in full sentences. Bilateral wrists: Inspection normal with no visible erythema or swelling. ROM smooth and normal with good flexion and extension and ulnar/radial deviation that is symmetrical with opposite wrist. Palpation is normal over metacarpals, navicular, lunate, and TFCC; tendons without tenderness/ swelling No snuffbox tenderness.  No tenderness over Canal of [place]. Strength 5/5 in all directions without pain. Positive Tinel's and phalens signs. Negative Finkelstein sign. Negative Watson's test.  b: Real-time Ultrasound Guided hydrodissection of the left median nerve at the carpal tunnel Device: GE Logiq E  Verbal informed consent obtained.  Time-out conducted.  Noted no overlying erythema, induration, or other signs of local infection.  Skin prepped in a sterile fashion.  Local anesthesia: Topical Ethyl chloride.  With sterile technique and under real time ultrasound guidance: Using a 25-gauge needle advanced into the carpal tunnel, taking care to avoid intraneural injection I injected medication both superficial to and deep to the median nerve freeing it from surrounding structures, I then redirected the needle deep and injected further medication around the flexor tendons deep within the carpal tunnel for a total of 1 cc kenalog 40, 5 cc lidocaine. Completed without difficulty  Advised to call if fevers/chills, erythema, induration, drainage, or persistent bleeding.  Images permanently stored and available for review in the ultrasound unit.  Impression: Technically successful ultrasound guided median nerve hydrodissection.  Procedure: Real-time Ultrasound Guided hydrodissection of the right median nerve at the carpal tunnel Device: GE Logiq E  Verbal informed consent obtained.  Time-out conducted.  Noted no overlying erythema, induration, or other signs of local infection.  Skin prepped in a sterile fashion.  Local anesthesia: Topical Ethyl chloride.  With sterile technique and under real time ultrasound guidance: Using a 25-gauge needle advanced into the carpal tunnel, taking care to avoid intraneural injection I injected medication both superficial to and deep to the median nerve freeing it from surrounding structures, I then redirected the needle deep and injected further medication around the flexor tendons  deep within the carpal tunnel for a total of 1 cc kenalog 40, 5 cc lidocaine. Completed without difficulty  Advised to call if fevers/chills, erythema, induration, drainage, or persistent bleeding.  Images permanently stored and available for review in the ultrasound unit.  Impression: Technically successful ultrasound guided median nerve hydrodissection.  Impression and Recommendations:    Carpal tunnel syndrome, bilateral Bilateral median nerve hydrodissection, previous injection was in November on the right and on the left in June 2019. Return as needed.   ___________________________________________ [name]. [name], M.D., ABFM., CAQSM. Primary Care and Sports Medicine [facility]  Adjunct Professor of [place] of [facility] of Medicine

## 2019-01-12 NOTE — Assessment & Plan Note (Signed)
Bilateral median nerve hydrodissection, previous injection was in November on the right and on the left in June 2019. Return as needed.

## 2019-01-13 ENCOUNTER — Ambulatory Visit: Payer: Medicare Other | Admitting: Sports Medicine

## 2019-03-17 ENCOUNTER — Other Ambulatory Visit: Payer: Self-pay | Admitting: Sports Medicine

## 2019-03-21 ENCOUNTER — Other Ambulatory Visit: Payer: Self-pay | Admitting: Sports Medicine

## 2019-04-10 ENCOUNTER — Encounter: Payer: Self-pay | Admitting: Sports Medicine

## 2019-04-10 ENCOUNTER — Ambulatory Visit (INDEPENDENT_AMBULATORY_CARE_PROVIDER_SITE_OTHER): Payer: 59 | Admitting: Family Medicine

## 2019-04-10 ENCOUNTER — Encounter: Payer: Self-pay | Admitting: Family Medicine

## 2019-04-10 VITALS — BP 144/62 | HR 66 | Temp 97.7°F | Wt 255.0 lb

## 2019-04-10 DIAGNOSIS — M2560 Stiffness of unspecified joint, not elsewhere classified: Secondary | ICD-10-CM | POA: Diagnosis not present

## 2019-04-10 DIAGNOSIS — M5442 Lumbago with sciatica, left side: Secondary | ICD-10-CM | POA: Diagnosis not present

## 2019-04-10 DIAGNOSIS — R5383 Other fatigue: Secondary | ICD-10-CM

## 2019-04-10 DIAGNOSIS — I7771 Dissection of carotid artery: Secondary | ICD-10-CM | POA: Diagnosis not present

## 2019-04-10 DIAGNOSIS — R7989 Other specified abnormal findings of blood chemistry: Secondary | ICD-10-CM

## 2019-04-10 DIAGNOSIS — M7989 Other specified soft tissue disorders: Secondary | ICD-10-CM

## 2019-04-10 DIAGNOSIS — I10 Essential (primary) hypertension: Secondary | ICD-10-CM | POA: Diagnosis not present

## 2019-04-10 DIAGNOSIS — E669 Obesity, unspecified: Secondary | ICD-10-CM

## 2019-04-10 DIAGNOSIS — G8929 Other chronic pain: Secondary | ICD-10-CM | POA: Diagnosis not present

## 2019-04-10 DIAGNOSIS — M2569 Stiffness of other specified joint, not elsewhere classified: Secondary | ICD-10-CM

## 2019-04-10 NOTE — Patient Instructions (Addendum)
Thank you for coming in today. Schedule the ultrasound.  Get labs.   Recheck sooner if needed.

## 2019-04-10 NOTE — Progress Notes (Signed)
Kelsey Snow is a 53 y.o. female who presents to [facility] [place] today for left calf bulging and pain.  Patient notes a 4-week history of left calf bulging.  She notes a varicose vein present as well that is new.  She denies any injury.  No fevers or chills.  She notes she has a history of persistent pain and numbness from a L5-S1 fusion in the past.  She sees a pain management specialist for her chronic pain.  She has this is been stable recently.  She notes that the pain medicine specialist is concerned that she may have psoriatic arthritis and where she [place] work-up.  Additionally she notes that she has been fatigued and having weight gain recently.  She denies severe shortness of breath chest pain or palpitations.   She also has a history of prediabetes and hypertension and hyperlipidemia.  ROS as above:  Exam:  BP (!) 144/62   Pulse 66   Temp 97.7 F (36.5 C) (Oral)   Wt 255 lb (115.7 kg)   BMI 43.77 kg/m  Wt Readings from Last 5 Encounters:  04/10/19 255 lb (115.7 kg)  01/02/19 250 lb 12.8 oz (113.8 kg)  07/01/18 220 lb (99.8 kg)  06/24/18 223 lb (101.2 kg)  06/09/18 225 lb (102.1 kg)    Gen: Well NAD HEENT: EOMI,  MMM Lungs: Normal work of breathing. CTABL Heart: RRR no MRG Abd: NABS, Soft. Nondistended, Nontender Exts: Brisk capillary refill, warm and well perfused.  Left calf diameter slightly increased compared to right.  No palpable cords.  Calf squeeze test is negative.  Slight varicosities present on left leg.  Pulses cap refill sensation distally bilateral lower extremities.   Assessment and Plan: 53 y.o. female with leg swelling.  Ongoing for few weeks.  Etiology at this time is somewhat unclear however patient will benefit from further work-up.  Will obtain duplex ultrasound to screen for DVT.  Additionally patient has had weight gain and  fatigue recently.  Plan for metabolic work-up.  We will also recheck lipid panel given history of hyperlipidemia and A1c given history of prediabetes.  Additionally she has continued chronic back pain and pain management physicians are recommending psoriatic arthritis work-up.  We will proceed with HLA-B27 and sed rate.  Follow-up with PCP.  PDMP not reviewed this encounter. Orders Placed This Encounter  Procedures  . US Venous Img Lower Unilateral Left    Standing Status:   Future    Standing Expiration Date:   06/09/2020    Order Specific Question:   Reason for Exam (SYMPTOM  OR DIAGNOSIS REQUIRED)    Answer:   eval left leg swelling    Order Specific Question:   Preferred imaging location?    Answer:   [facility]  . CBC  . COMPLETE METABOLIC PANEL WITH GFR  . Lipid Panel w/reflex Direct LDL  . TSH  . T3, free  . T4, free  . Hemoglobin A1c  . HLA-B27 antigen  . Sedimentation rate   No orders of the defined types were placed in this encounter.    Historical information moved to improve visibility of documentation.  Past Medical History:  Diagnosis Date  . Anemia   . Essential hypertension, benign 03/07/2015  . GERD (gastroesophageal reflux disease)   . Hx of ectopic pregnancy   . Hyperlipidemia   . Lumbosacral spondylolysis   . Obese   . OCD (obsessive compulsive disorder)   .  Thyroid disease    Past Surgical History:  Procedure Laterality Date  . BACK SURGERY    . ECTOPIC PREGNANCY SURGERY    . TUBAL LIGATION    . TUBAL LIGATION     Social History   Tobacco Use  . Smoking status: Never Smoker  . Smokeless tobacco: Never Used  Substance Use Topics  . Alcohol use: Yes    Alcohol/week: 12.0 - 13.0 standard drinks    Types: 6 - 7 Glasses of wine, 6 Standard drinks or equivalent per week   family history includes AAA (abdominal aortic aneurysm) in her father; Alcohol abuse in her maternal uncle; Aneurysm in her father; Cancer in her mother; Depression  in her mother; Diabetes in her maternal grandmother; Drug abuse in her sister; Hyperlipidemia in her brother; Thyroid disease in her mother; Varicose Veins in her mother.  Medications: Current Outpatient Medications  Medication Sig Dispense Refill  . aspirin 81 MG EC tablet TAKE 1 TABLET (81 MG TOTAL) BY MOUTH DAILY. 90 tablet 3  . Cholecalciferol (VITAMIN D3) 2000 UNITS TABS Take by mouth 2 (two) times a week. TAKE 2 TABLETS Sunday AND 1 TABLET Tuesday AND Thursday.    . escitalopram (LEXAPRO) 20 MG tablet Take 1 tablet (20 mg total) by mouth daily. 90 tablet 3  . levothyroxine (SYNTHROID) 137 MCG tablet TAKE ONE TABLET BY MOUTH DAILY BEFORE BREAKFAST. Needs annual appt and labs 30 tablet 0  . Mag Aspart-Potassium Aspart (POTASSIUM & MAGNESIUM ASPARTAT) 250-250 MG CAPS Take by mouth. TAKE 2 CAPSULES Tuesday  AND Thursday  MORNING    . omeprazole (PRILOSEC) 40 MG capsule TAKE ONE CAPSULE BY MOUTH DAILY 90 capsule 2  . oxyCODONE-acetaminophen (PERCOCET/ROXICET) 5-325 MG tablet Take by mouth.    . traMADol (ULTRAM-ER) 200 MG 24 hr tablet     . traZODone (DESYREL) 100 MG tablet Take 1-2 tablets (100-200 mg total) by mouth at bedtime. 180 tablet 3  . rosuvastatin (CRESTOR) 10 MG tablet TAKE ONE TABLET BY MOUTH DAILY (Patient not taking: Reported on 04/10/2019) 30 tablet 0   No current facility-administered medications for this visit.    Allergies  Allergen Reactions  . Bee Venom Anaphylaxis  . Topiramate Other (See Comments)     Discussed warning signs or symptoms. Please see discharge instructions. Patient expresses understanding.

## 2019-04-12 ENCOUNTER — Other Ambulatory Visit: Payer: Self-pay | Admitting: Sports Medicine

## 2019-04-13 ENCOUNTER — Other Ambulatory Visit: Payer: Self-pay

## 2019-04-13 ENCOUNTER — Ambulatory Visit: Payer: 59

## 2019-04-13 DIAGNOSIS — M7989 Other specified soft tissue disorders: Secondary | ICD-10-CM | POA: Diagnosis not present

## 2019-04-13 DIAGNOSIS — M79662 Pain in left lower leg: Secondary | ICD-10-CM | POA: Diagnosis not present

## 2019-04-14 LAB — COMPLETE METABOLIC PANEL WITH GFR
AG Ratio: 1.7 (calc) (ref 1.0–2.5)
ALT: 17 U/L (ref 6–29)
AST: 17 U/L (ref 10–35)
Albumin: 4.2 g/dL (ref 3.6–5.1)
Alkaline phosphatase (APISO): 50 U/L (ref 37–153)
BUN: 14 mg/dL (ref 7–25)
CO2: 28 mmol/L (ref 20–32)
Calcium: 9.9 mg/dL (ref 8.6–10.4)
Chloride: 104 mmol/L (ref 98–110)
Creat: 0.76 mg/dL (ref 0.50–1.05)
GFR, Est African American: 105 mL/min/{1.73_m2} (ref >=60)
GFR, Est Non African American: 91 mL/min/{1.73_m2} (ref >=60)
Globulin: 2.5 g/dL (calc) (ref 1.9–3.7)
Glucose, Bld: 103 mg/dL — ABNORMAL HIGH (ref 65–99)
Potassium: 4.8 mmol/L (ref 3.5–5.3)
Sodium: 140 mmol/L (ref 135–146)
Total Bilirubin: 0.3 mg/dL (ref 0.2–1.2)
Total Protein: 6.7 g/dL (ref 6.1–8.1)

## 2019-04-14 LAB — LIPID PANEL W/REFLEX DIRECT LDL
Cholesterol: 182 mg/dL (ref <200)
HDL: 71 mg/dL (ref >=50)
LDL Cholesterol (Calc): 96 mg/dL (calc)
Non-HDL Cholesterol (Calc): 111 mg/dL (calc) (ref <130)
Total CHOL/HDL Ratio: 2.6 (calc) (ref <5.0)
Triglycerides: 63 mg/dL (ref <150)

## 2019-04-14 LAB — T4, FREE: Free T4: 1.4 ng/dL (ref 0.8–1.8)

## 2019-04-14 LAB — TSH: TSH: 0.98 mIU/L

## 2019-04-14 LAB — CBC
HCT: 41.7 % (ref 35.0–45.0)
Hemoglobin: 13.4 g/dL (ref 11.7–15.5)
MCH: 29.5 pg (ref 27.0–33.0)
MCHC: 32.1 g/dL (ref 32.0–36.0)
MCV: 91.9 fL (ref 80.0–100.0)
MPV: 10 fL (ref 7.5–12.5)
Platelets: 280 10*3/uL (ref 140–400)
RBC: 4.54 10*6/uL (ref 3.80–5.10)
RDW: 13.3 % (ref 11.0–15.0)
WBC: 5.1 10*3/uL (ref 3.8–10.8)

## 2019-04-14 LAB — HEMOGLOBIN A1C
Hgb A1c MFr Bld: 5.8 % of total Hgb — ABNORMAL HIGH (ref <5.7)
Mean Plasma Glucose: 120 (calc)
eAG (mmol/L): 6.6 (calc)

## 2019-04-14 LAB — T3, FREE: T3, Free: 3.2 pg/mL (ref 2.3–4.2)

## 2019-04-19 LAB — SEDIMENTATION RATE: Sed Rate: 9 mm/h (ref 0–30)

## 2019-04-19 LAB — HLA-B27 ANTIGEN: HLA-B27 Antigen: NEGATIVE

## 2019-05-08 ENCOUNTER — Other Ambulatory Visit: Payer: Self-pay | Admitting: Sports Medicine

## 2019-05-17 ENCOUNTER — Other Ambulatory Visit: Payer: Self-pay | Admitting: Sports Medicine

## 2019-05-17 DIAGNOSIS — Z1231 Encounter for screening mammogram for malignant neoplasm of breast: Secondary | ICD-10-CM

## 2019-05-24 ENCOUNTER — Other Ambulatory Visit: Payer: Self-pay | Admitting: Neurosurgery

## 2019-05-24 DIAGNOSIS — M5416 Radiculopathy, lumbar region: Secondary | ICD-10-CM

## 2019-05-26 ENCOUNTER — Ambulatory Visit (INDEPENDENT_AMBULATORY_CARE_PROVIDER_SITE_OTHER): Payer: 59 | Admitting: Sports Medicine

## 2019-05-26 ENCOUNTER — Other Ambulatory Visit: Payer: Self-pay | Admitting: Neurosurgery

## 2019-05-26 ENCOUNTER — Encounter: Payer: Self-pay | Admitting: Sports Medicine

## 2019-05-26 ENCOUNTER — Other Ambulatory Visit: Payer: Self-pay

## 2019-05-26 DIAGNOSIS — G5603 Carpal tunnel syndrome, bilateral upper limbs: Secondary | ICD-10-CM

## 2019-05-26 DIAGNOSIS — F422 Mixed obsessional thoughts and acts: Secondary | ICD-10-CM | POA: Diagnosis not present

## 2019-05-26 DIAGNOSIS — M5416 Radiculopathy, lumbar region: Secondary | ICD-10-CM

## 2019-05-26 MED ORDER — FLUOXETINE HCL 20 MG PO CAPS: 20.0000 mg | ORAL_CAPSULE | Freq: Every day | ORAL | 3 refills | Status: DC

## 2019-05-26 NOTE — Progress Notes (Signed)
Subjective:    CC: Follow-up  HPI: Mood disorder: Has noted increased agoraphobia, depression, anxiety, no suicidal or homicidal ideation, she has been on Lexapro for many years now.  Carpal tunnel syndrome: Recurrence of symptoms on the right, moderate, persistent, localized in the wrist with radiation to the hand and fingertips, it has been almost 5 months since last median nerve Hydro dissection.  I reviewed the past medical history, family history, social history, surgical history, and allergies today and no changes were needed.  Please see the problem list section below in epic for further details.  Past Medical History: Past Medical History:  Diagnosis Date  . Anemia   . Essential hypertension, benign 03/07/2015  . GERD (gastroesophageal reflux disease)   . Hx of ectopic pregnancy   . Hyperlipidemia   . Lumbosacral spondylolysis   . Obese   . OCD (obsessive compulsive disorder)   . Thyroid disease    Past Surgical History: Past Surgical History:  Procedure Laterality Date  . BACK SURGERY    . ECTOPIC PREGNANCY SURGERY    . TUBAL LIGATION    . TUBAL LIGATION     Social History: Social History   Socioeconomic History  . Marital status: Married    Spouse name: Not on file  . Number of children: 1  . Years of education: 96  . Highest education level: Not on file  Occupational History    Employer: [employer]  Social Needs  . Financial resource strain: Not on file  . Food insecurity    Worry: Not on file    Inability: Not on file  . Transportation needs    Medical: Not on file    Non-medical: Not on file  Tobacco Use  . Smoking status: Never Smoker  . Smokeless tobacco: Never Used  Substance and Sexual Activity  . Alcohol use: Yes    Alcohol/week: 12.0 - 13.0 standard drinks    Types: 6 - 7 Glasses of wine, 6 Standard drinks or equivalent per week  . Drug use: No  . Sexual activity: Not on file  Lifestyle  . Physical activity    Days per week:  Not on file    Minutes per session: Not on file  . Stress: Not on file  Relationships  . Social Herbalist on phone: Not on file    Gets together: Not on file    Attends religious service: Not on file    Active member of club or organization: Not on file    Attends meetings of clubs or organizations: Not on file    Relationship status: Not on file  Other Topics Concern  . Not on file  Social History Narrative  . Not on file   Family History: Family History  Problem Relation Age of Onset  . Aneurysm Father   . AAA (abdominal aortic aneurysm) Father   . Depression Mother   . Cancer Mother        Pancreatic  . Thyroid disease Mother        hypothyroidism  . Varicose Veins Mother   . Drug abuse Sister   . Hyperlipidemia Brother   . Diabetes Maternal Grandmother   . Alcohol abuse Maternal Uncle    Allergies: Allergies  Allergen Reactions  . Bee Venom Anaphylaxis  . Topiramate Other (See Comments)   Medications: See med rec.  Review of Systems: No fevers, chills, night sweats, weight loss, chest pain, or shortness of breath.   Objective:  General: Well Developed, well nourished, and in no acute distress.  Neuro: Alert and oriented x3, extra-ocular muscles intact, sensation grossly intact.  HEENT: Normocephalic, atraumatic, pupils equal round reactive to light, neck supple, no masses, no lymphadenopathy, thyroid nonpalpable.  Skin: Warm and dry, no rashes. Cardiac: Regular rate and rhythm, no murmurs rubs or gallops, no lower extremity edema.  Respiratory: Clear to auscultation bilaterally. Not using accessory muscles, speaking in full sentences.  Procedure: Real-time Ultrasound Guided hydrodissection of the right median nerve at the carpal tunnel Device: GE Logiq E  Verbal informed consent obtained.  Time-out conducted.  Noted no overlying erythema, induration, or other signs of local infection.  Skin prepped in a sterile fashion.  Local anesthesia:  Topical Ethyl chloride.  With sterile technique and under real time ultrasound guidance: Using a 25-gauge needle advanced into the carpal tunnel, taking care to avoid intraneural injection I injected medication both superficial to and deep to the median nerve freeing it from surrounding structures, I then redirected the needle deep and injected further medication around the flexor tendons deep within the carpal tunnel for a total of 1 cc kenalog 40, 5 cc sterile saline. Completed without difficulty  Advised to call if fevers/chills, erythema, induration, drainage, or persistent bleeding.  Images permanently stored and available for review in the ultrasound unit.  Impression: Technically successful ultrasound guided median nerve hydrodissection.  Impression and Recommendations:    Obsessive compulsive disorder with depression and anxiety Worsening symptoms, we will down taper off of Lexapro and start Prozac. Referral for behavioral therapy. Advised to cut back alcohol consumption. Return monthly.  Carpal tunnel syndrome, bilateral Right median nerve Hydro dissection today.   ___________________________________________ [name]. [name], M.D., ABFM., CAQSM. Primary Care and Sports Medicine [facility] [facility]  Adjunct Professor of [place] of [facility] of Medicine

## 2019-05-26 NOTE — Assessment & Plan Note (Signed)
Right median nerve Hydro dissection today.

## 2019-05-26 NOTE — Assessment & Plan Note (Addendum)
Worsening symptoms, we will down taper off of Lexapro and start Prozac. Referral for behavioral therapy. Advised to cut back alcohol consumption. Return monthly.

## 2019-05-28 ENCOUNTER — Other Ambulatory Visit: Payer: Self-pay | Admitting: Sports Medicine

## 2019-05-28 DIAGNOSIS — F422 Mixed obsessional thoughts and acts: Secondary | ICD-10-CM

## 2019-05-29 ENCOUNTER — Other Ambulatory Visit: Payer: 59

## 2019-06-04 ENCOUNTER — Encounter: Payer: Self-pay | Admitting: Sports Medicine

## 2019-06-05 ENCOUNTER — Other Ambulatory Visit: Payer: Self-pay

## 2019-06-05 ENCOUNTER — Ambulatory Visit (INDEPENDENT_AMBULATORY_CARE_PROVIDER_SITE_OTHER): Payer: 59

## 2019-06-05 DIAGNOSIS — M48061 Spinal stenosis, lumbar region without neurogenic claudication: Secondary | ICD-10-CM | POA: Diagnosis not present

## 2019-06-05 DIAGNOSIS — M5416 Radiculopathy, lumbar region: Secondary | ICD-10-CM

## 2019-06-05 DIAGNOSIS — M4726 Other spondylosis with radiculopathy, lumbar region: Secondary | ICD-10-CM | POA: Diagnosis not present

## 2019-06-05 MED ORDER — GADOBUTROL 1 MMOL/ML IV SOLN
10.0000 mL | Freq: Once | INTRAVENOUS | Status: AC | PRN
  Administered 2019-06-05: 10 mL via INTRAVENOUS

## 2019-06-07 ENCOUNTER — Encounter: Payer: Self-pay | Admitting: Sports Medicine

## 2019-06-07 DIAGNOSIS — G894 Chronic pain syndrome: Secondary | ICD-10-CM | POA: Diagnosis not present

## 2019-06-07 DIAGNOSIS — M961 Postlaminectomy syndrome, not elsewhere classified: Secondary | ICD-10-CM | POA: Diagnosis not present

## 2019-06-07 DIAGNOSIS — Z79899 Other long term (current) drug therapy: Secondary | ICD-10-CM | POA: Diagnosis not present

## 2019-06-08 DIAGNOSIS — M5416 Radiculopathy, lumbar region: Secondary | ICD-10-CM | POA: Diagnosis not present

## 2019-06-08 DIAGNOSIS — R030 Elevated blood-pressure reading, without diagnosis of hypertension: Secondary | ICD-10-CM | POA: Diagnosis not present

## 2019-06-08 DIAGNOSIS — Z79899 Other long term (current) drug therapy: Secondary | ICD-10-CM | POA: Diagnosis not present

## 2019-06-08 DIAGNOSIS — Z6841 Body Mass Index (BMI) 40.0 and over, adult: Secondary | ICD-10-CM | POA: Diagnosis not present

## 2019-06-19 ENCOUNTER — Encounter: Payer: Self-pay | Admitting: Sports Medicine

## 2019-06-22 ENCOUNTER — Ambulatory Visit (INDEPENDENT_AMBULATORY_CARE_PROVIDER_SITE_OTHER): Payer: 59

## 2019-06-22 ENCOUNTER — Other Ambulatory Visit: Payer: Self-pay

## 2019-06-22 DIAGNOSIS — Z1231 Encounter for screening mammogram for malignant neoplasm of breast: Secondary | ICD-10-CM

## 2019-06-23 ENCOUNTER — Other Ambulatory Visit: Payer: 59

## 2019-06-23 ENCOUNTER — Ambulatory Visit: Payer: 59 | Admitting: Sports Medicine

## 2019-06-27 ENCOUNTER — Other Ambulatory Visit: Payer: Self-pay

## 2019-06-27 ENCOUNTER — Ambulatory Visit (INDEPENDENT_AMBULATORY_CARE_PROVIDER_SITE_OTHER): Payer: 59 | Admitting: Sports Medicine

## 2019-06-27 ENCOUNTER — Encounter: Payer: Self-pay | Admitting: Sports Medicine

## 2019-06-27 DIAGNOSIS — L723 Sebaceous cyst: Secondary | ICD-10-CM | POA: Diagnosis not present

## 2019-06-27 DIAGNOSIS — S99922A Unspecified injury of left foot, initial encounter: Secondary | ICD-10-CM

## 2019-06-27 DIAGNOSIS — D2372 Other benign neoplasm of skin of left lower limb, including hip: Secondary | ICD-10-CM | POA: Diagnosis not present

## 2019-06-27 NOTE — Assessment & Plan Note (Addendum)
Punch biopsy of a left anterior thigh lesion. Subcentimeter. I placed a single horizontal mattress and a simple interrupted stitch. Return in 10 days for suture removal.

## 2019-06-27 NOTE — Addendum Note (Signed)
Addended by: Beatris Ship L on: 06/27/2019 01:41 PM   Modules accepted: Orders

## 2019-06-27 NOTE — Progress Notes (Signed)
Subjective:    CC: Thigh lesion, ankle  HPI: Kelsey [name] returns, she has a lesion on [name] left anterior thigh that she would like removed.  In addition she continues have pain and swelling over the left lateral midfoot.  Present for greater than 6 weeks in spite of conservative measures, x-rays unrevealing.  I reviewed the past medical history, family history, social history, surgical history, and allergies today and no changes were needed.  Please see the problem list section below in epic for further details.  Past Medical History: Past Medical History:  Diagnosis Date  . Anemia   . Essential hypertension, benign 03/07/2015  . GERD (gastroesophageal reflux disease)   . Hx of ectopic pregnancy   . Hyperlipidemia   . Lumbosacral spondylolysis   . Obese   . OCD (obsessive compulsive disorder)   . Thyroid disease    Past Surgical History: Past Surgical History:  Procedure Laterality Date  . BACK SURGERY    . ECTOPIC PREGNANCY SURGERY    . TUBAL LIGATION    . TUBAL LIGATION     Social History: Social History   Socioeconomic History  . Marital status: Married    Spouse name: Not on file  . Number of children: 1  . Years of education: 90  . Highest education level: Not on file  Occupational History    Employer: [employer]  Social Needs  . Financial resource strain: Not on file  . Food insecurity    Worry: Not on file    Inability: Not on file  . Transportation needs    Medical: Not on file    Non-medical: Not on file  Tobacco Use  . Smoking status: Never Smoker  . Smokeless tobacco: Never Used  Substance and Sexual Activity  . Alcohol use: Yes    Alcohol/week: 12.0 - 13.0 standard drinks    Types: 6 - 7 Glasses of wine, 6 Standard drinks or equivalent per week  . Drug use: No  . Sexual activity: Not on file  Lifestyle  . Physical activity    Days per week: Not on file    Minutes per session: Not on file  . Stress: Not on file  Relationships  . Social  Herbalist on phone: Not on file    Gets together: Not on file    Attends religious service: Not on file    Active member of club or organization: Not on file    Attends meetings of clubs or organizations: Not on file    Relationship status: Not on file  Other Topics Concern  . Not on file  Social History Narrative  . Not on file   Family History: Family History  Problem Relation Age of Onset  . Aneurysm Father   . AAA (abdominal aortic aneurysm) Father   . Depression Mother   . Cancer Mother        Pancreatic  . Thyroid disease Mother        hypothyroidism  . Varicose Veins Mother   . Drug abuse Sister   . Hyperlipidemia Brother   . Diabetes Maternal Grandmother   . Alcohol abuse Maternal Uncle    Allergies: Allergies  Allergen Reactions  . Bee Venom Anaphylaxis  . Topiramate Other (See Comments)   Medications: See med rec.  Review of Systems: No fevers, chills, night sweats, weight loss, chest pain, or shortness of breath.   Objective:    General: Well Developed, well nourished, and in no  acute distress.  Neuro: Alert and oriented x3, extra-ocular muscles intact, sensation grossly intact.  HEENT: Normocephalic, atraumatic, pupils equal round reactive to light, neck supple, no masses, no lymphadenopathy, thyroid nonpalpable.  Skin: Warm and dry, no rashes. Cardiac: Regular rate and rhythm, no murmurs rubs or gallops, no lower extremity edema.  Respiratory: Clear to auscultation bilaterally. Not using accessory muscles, speaking in full sentences. Left thigh: There is a subcutaneous palpable nodule approximately 0.6 cm across Left foot: Swollen, tender over the dorsolateral midfoot.  Procedure: Punch biopsy of left thigh 0.6-0.7 cm lesion Risks, benefits, and alternatives explained and consent obtained. Time out conducted. Surface prepped with alcohol. 3cc lidocaine with epinephine infiltrated in a field block. Adequate anesthesia ensured. Area prepped  and draped in a sterile fashion. Excision performed with: Using a 6 mm punch biopsy I remove the lesion as a whole, and then closed it with a single horizontal mattress 3-0 Ethilon as well as a simple interrupted 3-0 Ethilon Hemostasis achieved. Pt stable.  Impression and Recommendations:    Sebaceous cyst Punch biopsy of a left anterior thigh lesion. Subcentimeter. I placed a single horizontal mattress and a simple interrupted stitch. Return in 10 days for suture removal.  Foot injury, left, initial encounter Persistent pain, swelling. Adding an MRI of the foot. X-rays were unrevealing.   ___________________________________________ Kelsey [name]. [name], M.D., ABFM., CAQSM. Primary Care and Sports Medicine [facility]  Adjunct Professor of [place] of [facility] of Medicine

## 2019-06-27 NOTE — Assessment & Plan Note (Signed)
Persistent pain, swelling. Adding an MRI of the foot. X-rays were unrevealing.

## 2019-07-02 ENCOUNTER — Ambulatory Visit (INDEPENDENT_AMBULATORY_CARE_PROVIDER_SITE_OTHER): Payer: 59

## 2019-07-02 ENCOUNTER — Other Ambulatory Visit: Payer: Self-pay

## 2019-07-02 DIAGNOSIS — S99922A Unspecified injury of left foot, initial encounter: Secondary | ICD-10-CM

## 2019-07-02 DIAGNOSIS — M79672 Pain in left foot: Secondary | ICD-10-CM | POA: Diagnosis not present

## 2019-07-02 DIAGNOSIS — M7989 Other specified soft tissue disorders: Secondary | ICD-10-CM | POA: Diagnosis not present

## 2019-07-06 DIAGNOSIS — M961 Postlaminectomy syndrome, not elsewhere classified: Secondary | ICD-10-CM | POA: Diagnosis not present

## 2019-07-06 DIAGNOSIS — G894 Chronic pain syndrome: Secondary | ICD-10-CM | POA: Diagnosis not present

## 2019-07-06 DIAGNOSIS — M47812 Spondylosis without myelopathy or radiculopathy, cervical region: Secondary | ICD-10-CM | POA: Diagnosis not present

## 2019-07-06 DIAGNOSIS — M47816 Spondylosis without myelopathy or radiculopathy, lumbar region: Secondary | ICD-10-CM | POA: Diagnosis not present

## 2019-07-07 ENCOUNTER — Other Ambulatory Visit: Payer: Self-pay

## 2019-07-07 ENCOUNTER — Encounter: Payer: Self-pay | Admitting: Sports Medicine

## 2019-07-07 ENCOUNTER — Ambulatory Visit (INDEPENDENT_AMBULATORY_CARE_PROVIDER_SITE_OTHER): Payer: 59 | Admitting: Sports Medicine

## 2019-07-07 DIAGNOSIS — S92352K Displaced fracture of fifth metatarsal bone, left foot, subsequent encounter for fracture with nonunion: Secondary | ICD-10-CM | POA: Diagnosis not present

## 2019-07-07 DIAGNOSIS — L723 Sebaceous cyst: Secondary | ICD-10-CM | POA: Diagnosis not present

## 2019-07-07 NOTE — Progress Notes (Signed)
Subjective:    CC: Follow-up  HPI: Punch biopsy site: Looks great.  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  This has been ongoing for a year now, is probably well overdue time for internal fixation.  I reviewed the past medical history, family history, social history, surgical history, and allergies today and no changes were needed.  Please see the problem list section below in epic for further details.  Past Medical History: Past Medical History:  Diagnosis Date  . Anemia   . Essential hypertension, benign 03/07/2015  . GERD (gastroesophageal reflux disease)   . Hx of ectopic pregnancy   . Hyperlipidemia   . Lumbosacral spondylolysis   . Obese   . OCD (obsessive compulsive disorder)   . Thyroid disease    Past Surgical History: Past Surgical History:  Procedure Laterality Date  . BACK SURGERY    . ECTOPIC PREGNANCY SURGERY    . TUBAL LIGATION    . TUBAL LIGATION     Social History: Social History   Socioeconomic History  . Marital status: Married    Spouse name: Not on file  . Number of children: 1  . Years of education: 16  . Highest education level: Not on file  Occupational History    Employer: [employer]  Social Needs  . Financial resource strain: Not on file  . Food insecurity    Worry: Not on file    Inability: Not on file  . Transportation needs    Medical: Not on file    Non-medical: Not on file  Tobacco Use  . Smoking status: Never Smoker  . Smokeless tobacco: Never Used  Substance and Sexual Activity  . Alcohol use: Yes    Alcohol/week: 12.0 - 13.0 standard drinks    Types: 6 - 7 Glasses of wine, 6 Standard drinks or equivalent per week  . Drug use: No  . Sexual activity: Not on file  Lifestyle  . Physical activity    Days per week: Not on file    Minutes per session: Not on file  . Stress: Not on file  Relationships  . Social Herbalist on phone: Not on file    Gets together: Not on file   Attends religious service: Not on file    Active member of club or organization: Not on file    Attends meetings of clubs or organizations: Not on file    Relationship status: Not on file  Other Topics Concern  . Not on file  Social History Narrative  . Not on file   Family History: Family History  Problem Relation Age of Onset  . Aneurysm Father   . AAA (abdominal aortic aneurysm) Father   . Depression Mother   . Cancer Mother        Pancreatic  . Thyroid disease Mother        hypothyroidism  . Varicose Veins Mother   . Drug abuse Sister   . Hyperlipidemia Brother   . Diabetes Maternal Grandmother   . Alcohol abuse Maternal Uncle    Allergies: Allergies  Allergen Reactions  . Bee Venom Anaphylaxis  . Topiramate Other (See Comments)   Medications: See med rec.  Review of Systems: No fevers, chills, night sweats, weight loss, chest pain, or shortness of breath.   Objective:    General: Well Developed, well nourished, and in no acute distress.  Neuro: Alert and oriented x3, extra-ocular muscles intact, sensation grossly intact.  HEENT:  Normocephalic, atraumatic, pupils equal round reactive to light, neck supple, no masses, no lymphadenopathy, thyroid nonpalpable.  Skin: Warm and dry, no rashes. Cardiac: Regular rate and rhythm, no murmurs rubs or gallops, no lower extremity edema.  Respiratory: Clear to auscultation bilaterally. Not using accessory muscles, speaking in full sentences. Left thigh: Incision, clean, dry, intact, sutures removed.  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 This has been ongoing for a year now, is probably well overdue time for internal fixation. Referral to Dr. Griffin Basil.  Sebaceous cyst Sutures removed after punch biopsy. Incision looks good, pathology was benign dermatofibroma.    ___________________________________________ Kelsey Snow. [name], M.D., ABFM., CAQSM. Primary Care and Sports Medicine [facility]  Adjunct Professor of [employer]

## 2019-07-07 NOTE — Assessment & Plan Note (Signed)
Sutures removed after punch biopsy. Incision looks good, pathology was benign dermatofibroma.

## 2019-07-07 NOTE — Assessment & Plan Note (Addendum)
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 This has been ongoing for a year now, is probably well overdue time for internal fixation. Referral to Dr. Griffin Basil.

## 2019-07-10 ENCOUNTER — Ambulatory Visit (INDEPENDENT_AMBULATORY_CARE_PROVIDER_SITE_OTHER): Payer: 59 | Admitting: Psychology

## 2019-07-10 DIAGNOSIS — F422 Mixed obsessional thoughts and acts: Secondary | ICD-10-CM

## 2019-07-11 DIAGNOSIS — M25572 Pain in left ankle and joints of left foot: Secondary | ICD-10-CM | POA: Diagnosis not present

## 2019-07-17 ENCOUNTER — Encounter: Payer: Self-pay | Admitting: Sports Medicine

## 2019-07-17 ENCOUNTER — Other Ambulatory Visit: Payer: Self-pay | Admitting: Sports Medicine

## 2019-07-17 DIAGNOSIS — F422 Mixed obsessional thoughts and acts: Secondary | ICD-10-CM

## 2019-07-21 MED ORDER — FLUOXETINE HCL 40 MG PO CAPS: 40.0000 mg | ORAL_CAPSULE | Freq: Every day | ORAL | 3 refills | Status: DC

## 2019-08-10 DIAGNOSIS — Z6841 Body Mass Index (BMI) 40.0 and over, adult: Secondary | ICD-10-CM | POA: Diagnosis not present

## 2019-08-10 DIAGNOSIS — M5416 Radiculopathy, lumbar region: Secondary | ICD-10-CM | POA: Diagnosis not present

## 2019-08-10 DIAGNOSIS — R030 Elevated blood-pressure reading, without diagnosis of hypertension: Secondary | ICD-10-CM | POA: Diagnosis not present

## 2019-08-10 DIAGNOSIS — M47816 Spondylosis without myelopathy or radiculopathy, lumbar region: Secondary | ICD-10-CM | POA: Diagnosis not present

## 2019-08-11 ENCOUNTER — Ambulatory Visit: Payer: Medicare Other | Admitting: Psychology

## 2019-08-13 ENCOUNTER — Other Ambulatory Visit: Payer: Self-pay | Admitting: Sports Medicine

## 2019-08-25 ENCOUNTER — Ambulatory Visit (INDEPENDENT_AMBULATORY_CARE_PROVIDER_SITE_OTHER): Payer: 59 | Admitting: Psychology

## 2019-08-25 DIAGNOSIS — F422 Mixed obsessional thoughts and acts: Secondary | ICD-10-CM

## 2019-08-25 DIAGNOSIS — F419 Anxiety disorder, unspecified: Secondary | ICD-10-CM

## 2019-08-30 DIAGNOSIS — Z1151 Encounter for screening for human papillomavirus (HPV): Secondary | ICD-10-CM | POA: Diagnosis not present

## 2019-08-30 DIAGNOSIS — Z01419 Encounter for gynecological examination (general) (routine) without abnormal findings: Secondary | ICD-10-CM | POA: Diagnosis not present

## 2019-08-30 DIAGNOSIS — M961 Postlaminectomy syndrome, not elsewhere classified: Secondary | ICD-10-CM | POA: Diagnosis not present

## 2019-08-30 DIAGNOSIS — G894 Chronic pain syndrome: Secondary | ICD-10-CM | POA: Diagnosis not present

## 2019-08-30 DIAGNOSIS — M47816 Spondylosis without myelopathy or radiculopathy, lumbar region: Secondary | ICD-10-CM | POA: Diagnosis not present

## 2019-08-30 LAB — RESULTS CONSOLE HPV: CHL HPV: NEGATIVE

## 2019-08-30 LAB — HM PAP SMEAR: HM Pap smear: NORMAL

## 2019-09-01 ENCOUNTER — Encounter: Payer: Self-pay | Admitting: Sports Medicine

## 2019-09-01 ENCOUNTER — Other Ambulatory Visit: Payer: Self-pay

## 2019-09-01 ENCOUNTER — Ambulatory Visit (INDEPENDENT_AMBULATORY_CARE_PROVIDER_SITE_OTHER): Payer: 59 | Admitting: Sports Medicine

## 2019-09-01 DIAGNOSIS — G5603 Carpal tunnel syndrome, bilateral upper limbs: Secondary | ICD-10-CM

## 2019-09-01 NOTE — Progress Notes (Signed)
Subjective:    CC: Bilateral hand numbness and tingling  HPI: Kelsey Snow returns, she is a pleasant 53 year old female with bilateral carpal tunnel syndrome, its been months since her last Hydro dissections, she is having recurrence of symptoms, paresthesias into both hands, worsening, localized in the wrist with radiation to the hands and fingertips.  Desires repeat interventional treatment today.  Symptoms of been present now for several days.  I reviewed the past medical history, family history, social history, surgical history, and allergies today and no changes were needed.  Please see the problem list section below in epic for further details.  Past Medical History: Past Medical History:  Diagnosis Date  . Anemia   . Essential hypertension, benign 03/07/2015  . GERD (gastroesophageal reflux disease)   . Hx of ectopic pregnancy   . Hyperlipidemia   . Lumbosacral spondylolysis   . Obese   . OCD (obsessive compulsive disorder)   . Thyroid disease    Past Surgical History: Past Surgical History:  Procedure Laterality Date  . BACK SURGERY    . ECTOPIC PREGNANCY SURGERY    . TUBAL LIGATION    . TUBAL LIGATION     Social History: Social History   Socioeconomic History  . Marital status: Married    Spouse name: Not on file  . Number of children: 1  . Years of education: 4  . Highest education level: Not on file  Occupational History    Employer: [employer]  Social Needs  . Financial resource strain: Not on file  . Food insecurity    Worry: Not on file    Inability: Not on file  . Transportation needs    Medical: Not on file    Non-medical: Not on file  Tobacco Use  . Smoking status: Never Smoker  . Smokeless tobacco: Never Used  Substance and Sexual Activity  . Alcohol use: Yes    Alcohol/week: 12.0 - 13.0 standard drinks    Types: 6 - 7 Glasses of wine, 6 Standard drinks or equivalent per week  . Drug use: No  . Sexual activity: Not on file  Lifestyle   . Physical activity    Days per week: Not on file    Minutes per session: Not on file  . Stress: Not on file  Relationships  . Social Herbalist on phone: Not on file    Gets together: Not on file    Attends religious service: Not on file    Active member of club or organization: Not on file    Attends meetings of clubs or organizations: Not on file    Relationship status: Not on file  Other Topics Concern  . Not on file  Social History Narrative  . Not on file   Family History: Family History  Problem Relation Age of Onset  . Aneurysm Father   . AAA (abdominal aortic aneurysm) Father   . Depression Mother   . Cancer Mother        Pancreatic  . Thyroid disease Mother        hypothyroidism  . Varicose Veins Mother   . Drug abuse Sister   . Hyperlipidemia Brother   . Diabetes Maternal Grandmother   . Alcohol abuse Maternal Uncle    Allergies: Allergies  Allergen Reactions  . Bee Venom Anaphylaxis  . Topiramate Other (See Comments)   Medications: See med rec.  Review of Systems: No fevers, chills, night sweats, weight loss, chest pain, or shortness  of breath.   Objective:    General: Well Developed, well nourished, and in no acute distress.  Neuro: Alert and oriented x3, extra-ocular muscles intact, sensation grossly intact.  HEENT: Normocephalic, atraumatic, pupils equal round reactive to light, neck supple, no masses, no lymphadenopathy, thyroid nonpalpable.  Skin: Warm and dry, no rashes. Cardiac: Regular rate and rhythm, no murmurs rubs or gallops, no lower extremity edema.  Respiratory: Clear to auscultation bilaterally. Not using accessory muscles, speaking in full sentences.  Procedure: Real-time Ultrasound Guided hydrodissection of the left median nerve at the carpal tunnel Device: Samsung HS60 Verbal informed consent obtained.  Time-out conducted.  Noted no overlying erythema, induration, or other signs of local infection.  Skin prepped in a  sterile fashion.  Local anesthesia: Topical Ethyl chloride.  With sterile technique and under real time ultrasound guidance: Using a 25-gauge needle advanced into the carpal tunnel, taking care to avoid intraneural injection I injected medication both superficial to and deep to the median nerve freeing it from surrounding structures, I then redirected the needle deep and injected further medication around the flexor tendons deep within the carpal tunnel for a total of 1 cc kenalog 40, 5 cc 1% lidocaine. Completed without difficulty  Advised to call if fevers/chills, erythema, induration, drainage, or persistent bleeding.  Images permanently stored and available for review in the ultrasound unit.  Impression: Technically successful ultrasound guided median nerve hydrodissection.  Procedure: Real-time Ultrasound Guided hydrodissection of the right median nerve at the carpal tunnel Device: Samsung HS60 Verbal informed consent obtained.  Time-out conducted.  Noted no overlying erythema, induration, or other signs of local infection.  Skin prepped in a sterile fashion.  Local anesthesia: Topical Ethyl chloride.  With sterile technique and under real time ultrasound guidance: Using a 25-gauge needle advanced into the carpal tunnel, taking care to avoid intraneural injection I injected medication both superficial to and deep to the median nerve freeing it from surrounding structures, I then redirected the needle deep and injected further medication around the flexor tendons deep within the carpal tunnel for a total of 1 cc kenalog 40, 5 cc 1% lidocaine. Completed without difficulty  Advised to call if fevers/chills, erythema, induration, drainage, or persistent bleeding.  Images permanently stored and available for review in the ultrasound unit.  Impression: Technically successful ultrasound guided median nerve hydrodissection.  Impression and Recommendations:    Carpal tunnel syndrome, bilateral  Bilateral median nerve hydrodissection today, we last did the right side in August 2020 and the left side in March 2020.   ___________________________________________ [name]. [name], M.D., ABFM., CAQSM. Primary Care and Sports Medicine [facility]  Adjunct Professor of [employer] [employer]

## 2019-09-01 NOTE — Assessment & Plan Note (Addendum)
Bilateral median nerve hydrodissection today, we last did the right side in August 2020 and the left side in March 2020.

## 2019-09-05 DIAGNOSIS — M47816 Spondylosis without myelopathy or radiculopathy, lumbar region: Secondary | ICD-10-CM | POA: Diagnosis not present

## 2019-09-08 ENCOUNTER — Ambulatory Visit (INDEPENDENT_AMBULATORY_CARE_PROVIDER_SITE_OTHER): Payer: 59 | Admitting: Psychology

## 2019-09-08 DIAGNOSIS — F419 Anxiety disorder, unspecified: Secondary | ICD-10-CM

## 2019-09-08 DIAGNOSIS — F422 Mixed obsessional thoughts and acts: Secondary | ICD-10-CM | POA: Diagnosis not present

## 2019-09-12 ENCOUNTER — Other Ambulatory Visit: Payer: Self-pay | Admitting: Sports Medicine

## 2019-09-16 ENCOUNTER — Encounter: Payer: Self-pay | Admitting: Sports Medicine

## 2019-09-16 ENCOUNTER — Other Ambulatory Visit: Payer: Self-pay | Admitting: Sports Medicine

## 2019-09-18 NOTE — Telephone Encounter (Signed)
Pap results printed and put in Dr. Helane Rima box to look at then send to scan. KG LPN

## 2019-09-28 ENCOUNTER — Encounter: Payer: Self-pay | Admitting: Sports Medicine

## 2019-10-02 ENCOUNTER — Ambulatory Visit: Payer: Medicare Other | Admitting: Psychology

## 2019-10-13 ENCOUNTER — Other Ambulatory Visit: Payer: Self-pay | Admitting: Sports Medicine

## 2019-10-17 ENCOUNTER — Other Ambulatory Visit: Payer: Self-pay | Admitting: Sports Medicine

## 2019-10-19 DIAGNOSIS — M47816 Spondylosis without myelopathy or radiculopathy, lumbar region: Secondary | ICD-10-CM | POA: Diagnosis not present

## 2019-10-19 DIAGNOSIS — M47817 Spondylosis without myelopathy or radiculopathy, lumbosacral region: Secondary | ICD-10-CM | POA: Diagnosis not present

## 2019-11-04 ENCOUNTER — Other Ambulatory Visit: Payer: Self-pay | Admitting: Sports Medicine

## 2019-11-09 DIAGNOSIS — M533 Sacrococcygeal disorders, not elsewhere classified: Secondary | ICD-10-CM | POA: Diagnosis not present

## 2019-11-11 ENCOUNTER — Other Ambulatory Visit: Payer: Self-pay | Admitting: Sports Medicine

## 2019-11-17 ENCOUNTER — Ambulatory Visit: Payer: Medicare Other | Admitting: Psychology

## 2019-11-17 ENCOUNTER — Other Ambulatory Visit: Payer: Self-pay | Admitting: Sports Medicine

## 2019-11-17 DIAGNOSIS — F422 Mixed obsessional thoughts and acts: Secondary | ICD-10-CM

## 2019-12-07 ENCOUNTER — Encounter: Payer: Self-pay | Admitting: Sports Medicine

## 2019-12-07 ENCOUNTER — Telehealth (INDEPENDENT_AMBULATORY_CARE_PROVIDER_SITE_OTHER): Payer: 59 | Admitting: Sports Medicine

## 2019-12-07 DIAGNOSIS — R1013 Epigastric pain: Secondary | ICD-10-CM

## 2019-12-07 MED ORDER — OMEPRAZOLE 40 MG PO CPDR: 40.0000 mg | DELAYED_RELEASE_CAPSULE | Freq: Two times a day (BID) | ORAL | 2 refills | Status: DC

## 2019-12-07 MED ORDER — SUCRALFATE 1 G PO TABS: 1.0000 g | ORAL_TABLET | Freq: Four times a day (QID) | ORAL | 0 refills | Status: DC

## 2019-12-07 NOTE — Progress Notes (Signed)
Virtual Visit via WebEx/MyChart   I connected with  Kelsey Snow  on 12/07/19 via WebEx/MyChart/Doximity Video and verified that I am speaking with the correct person using two identifiers.   I discussed the limitations, risks, security and privacy concerns of performing an evaluation and management service by WebEx/MyChart/Doximity Video, including the higher likelihood of inaccurate diagnosis and treatment, and the availability of in person appointments.  We also discussed the likely need of an additional face to face encounter for complete and high quality delivery of care.  I also discussed with the patient that there may be a patient responsible charge related to this service. The patient expressed understanding and wishes to proceed.  Provider location is either at home or medical facility. Patient location is at their home, different from provider location. People involved in care of the patient during this telehealth encounter were myself, my nurse/medical assistant, and my front office/scheduling team member.  Review of Systems: No fevers, chills, night sweats, weight loss, chest pain, or shortness of breath.   Objective Findings:    General: Speaking full sentences, no audible heavy breathing.  Sounds alert and appropriately interactive.  Appears well.  Face symmetric.  Extraocular movements intact.  Pupils equal and round.  No nasal flaring or accessory muscle use visualized.  Independent interpretation of tests performed by another provider:   None.  Impression and Recommendations:    Epigastric pain This is a pleasant 54 year old female with a history of peptic ulcer disease, for about a week and a half now after eating a jalapeno cheeseburger she has had midepigastric pain, and periumbilical pain, mild to moderate.  She has also noted increasing diarrhea several times per day with occasional episodes of black and tarry stools. She feels a little bit tired but overall  okay. She is still able to eat, drink fluids. I have advised her that as I am unable to perform a physical exam virtually, she should drop by the office tomorrow for labs as well as make an appointment to to see one of my partners for at least a cursory abdominal exam. As I do think she has an upper GI bleed be from gastritis or a bleeding ulcer we are going to increase her omeprazole to 40 mg twice daily, add Carafate 4 times daily, checking labs, and I would like to go ahead and do a referral to Dr. Bryan Lemma for upper endoscopy. I did advise her that the urgency of the situation is in part determined by her degree of anemia so we really need the labs tomorrow.  She will certainly call back if pain or bowel symptoms worsen significantly. In the meantime she will continue to avoid NSAIDs and I have advised her to avoid alcohol for now as well.   I discussed the above assessment and treatment plan with the patient. The patient was provided an opportunity to ask questions and all were answered. The patient agreed with the plan and demonstrated an understanding of the instructions.   The patient was advised to call back or seek an in-person evaluation if the symptoms worsen or if the condition fails to improve as anticipated.   I provided 30 minutes of face to face and non-face-to-face time during this encounter date, time was needed to gather information, review chart, records, communicate/coordinate with staff remotely, as well as complete documentation.   ___________________________________________ [name]. [name], M.D., ABFM., CAQSM. Primary Care and [place] Instructor of Family Medicine  [employer]

## 2019-12-07 NOTE — Assessment & Plan Note (Addendum)
This is a pleasant 54 year old female with a history of peptic ulcer disease, for about a week and a half now after eating a jalapeno cheeseburger she has had midepigastric pain, and periumbilical pain, mild to moderate.  She has also noted increasing diarrhea several times per day with occasional episodes of black and tarry stools. She feels a little bit tired but overall okay. She is still able to eat, drink fluids. I have advised her that as I am unable to perform a physical exam virtually, she should drop by the office tomorrow for labs as well as make an appointment to to see one of my partners for at least a cursory abdominal exam. As I do think she has an upper GI bleed be from gastritis or a bleeding ulcer we are going to increase her omeprazole to 40 mg twice daily, add Carafate 4 times daily, checking labs, and I would like to go ahead and do a referral to Dr. Bryan Lemma for upper endoscopy. I did advise her that the urgency of the situation is in part determined by her degree of anemia so we really need the labs tomorrow.  She will certainly call back if pain or bowel symptoms worsen significantly. In the meantime she will continue to avoid NSAIDs and I have advised her to avoid alcohol for now as well.

## 2019-12-11 ENCOUNTER — Other Ambulatory Visit: Payer: Self-pay | Admitting: Sports Medicine

## 2019-12-12 ENCOUNTER — Other Ambulatory Visit: Payer: Self-pay | Admitting: Sports Medicine

## 2019-12-13 DIAGNOSIS — R1013 Epigastric pain: Secondary | ICD-10-CM | POA: Diagnosis not present

## 2019-12-14 ENCOUNTER — Other Ambulatory Visit: Payer: Self-pay

## 2019-12-14 ENCOUNTER — Encounter: Payer: Self-pay | Admitting: Sports Medicine

## 2019-12-14 ENCOUNTER — Ambulatory Visit (INDEPENDENT_AMBULATORY_CARE_PROVIDER_SITE_OTHER): Payer: 59 | Admitting: Sports Medicine

## 2019-12-14 DIAGNOSIS — R1013 Epigastric pain: Secondary | ICD-10-CM

## 2019-12-14 DIAGNOSIS — E039 Hypothyroidism, unspecified: Secondary | ICD-10-CM | POA: Diagnosis not present

## 2019-12-14 LAB — CBC
HCT: 40.5 % (ref 35.0–45.0)
Hemoglobin: 13.5 g/dL (ref 11.7–15.5)
MCH: 30.1 pg (ref 27.0–33.0)
MCHC: 33.3 g/dL (ref 32.0–36.0)
MCV: 90.4 fL (ref 80.0–100.0)
MPV: 9.8 fL (ref 7.5–12.5)
Platelets: 287 10*3/uL (ref 140–400)
RBC: 4.48 10*6/uL (ref 3.80–5.10)
RDW: 13.2 % (ref 11.0–15.0)
WBC: 5.5 10*3/uL (ref 3.8–10.8)

## 2019-12-14 LAB — PROTIME-INR
INR: 1
Prothrombin Time: 10.4 s (ref 9.0–11.5)

## 2019-12-14 LAB — COMPLETE METABOLIC PANEL WITH GFR
AG Ratio: 1.6 (calc) (ref 1.0–2.5)
ALT: 18 U/L (ref 6–29)
AST: 15 U/L (ref 10–35)
Albumin: 4.1 g/dL (ref 3.6–5.1)
Alkaline phosphatase (APISO): 50 U/L (ref 37–153)
BUN: 15 mg/dL (ref 7–25)
CO2: 25 mmol/L (ref 20–32)
Calcium: 9.5 mg/dL (ref 8.6–10.4)
Chloride: 104 mmol/L (ref 98–110)
Creat: 0.86 mg/dL (ref 0.50–1.05)
GFR, Est African American: 90 mL/min/{1.73_m2} (ref >=60)
GFR, Est Non African American: 78 mL/min/{1.73_m2} (ref >=60)
Globulin: 2.6 g/dL (calc) (ref 1.9–3.7)
Glucose, Bld: 90 mg/dL (ref 65–99)
Potassium: 4.5 mmol/L (ref 3.5–5.3)
Sodium: 140 mmol/L (ref 135–146)
Total Bilirubin: 0.3 mg/dL (ref 0.2–1.2)
Total Protein: 6.7 g/dL (ref 6.1–8.1)

## 2019-12-14 LAB — LIPASE: Lipase: 46 U/L (ref 7–60)

## 2019-12-14 LAB — AMYLASE: Amylase: 25 U/L (ref 21–101)

## 2019-12-14 LAB — APTT: aPTT: 24 s (ref 23–32)

## 2019-12-14 MED ORDER — LEVOTHYROXINE SODIUM 137 MCG PO TABS: 137.0000 ug | ORAL_TABLET | Freq: Every day | ORAL | 3 refills | Status: DC

## 2019-12-14 NOTE — Assessment & Plan Note (Signed)
Overall stable, needs refill on levothyroxine

## 2019-12-14 NOTE — Assessment & Plan Note (Signed)
Kelsey Snow returns, she has improved considerably, she did have a few episodes of midepigastric pain with dark and tarry stools. We obtained some labs, they are available in epic and normal. She never got the Carafate, and she has not yet gone up on her omeprazole. She will have a follow-up coming up with gastroenterology and I do think it is prudent for her to have an upper endoscopy.

## 2019-12-14 NOTE — Progress Notes (Signed)
    Procedures performed today:    None.  Independent interpretation of tests performed by another provider:   I reviewed her labs including her CBC, CMP, these were normal.  Impression and Recommendations:    Epigastric pain Kelsey Snow returns, she has improved considerably, she did have a few episodes of midepigastric pain with dark and tarry stools. We obtained some labs, they are available in epic and normal. She never got the Carafate, and she has not yet gone up on her omeprazole. She will have a follow-up coming up with gastroenterology and I do think it is prudent for her to have an upper endoscopy.  Hypothyroidism Overall stable, needs refill on levothyroxine    ___________________________________________ [name]. [name], M.D., ABFM., CAQSM. Primary Care and [place] Instructor of [place] [employer] [facility] [employer] [facility] [employer]

## 2019-12-19 ENCOUNTER — Encounter: Payer: Self-pay | Admitting: Gastroenterology

## 2019-12-28 ENCOUNTER — Other Ambulatory Visit: Payer: Self-pay | Admitting: Sports Medicine

## 2020-01-01 ENCOUNTER — Encounter: Payer: Self-pay | Admitting: Gastroenterology

## 2020-01-01 ENCOUNTER — Ambulatory Visit (INDEPENDENT_AMBULATORY_CARE_PROVIDER_SITE_OTHER): Payer: 59 | Admitting: Gastroenterology

## 2020-01-01 ENCOUNTER — Other Ambulatory Visit: Payer: Self-pay

## 2020-01-01 VITALS — BP 124/82 | HR 70 | Temp 97.1°F | Ht 64.0 in | Wt 267.4 lb

## 2020-01-01 DIAGNOSIS — K219 Gastro-esophageal reflux disease without esophagitis: Secondary | ICD-10-CM

## 2020-01-01 DIAGNOSIS — R1013 Epigastric pain: Secondary | ICD-10-CM | POA: Diagnosis not present

## 2020-01-01 DIAGNOSIS — Z1211 Encounter for screening for malignant neoplasm of colon: Secondary | ICD-10-CM | POA: Diagnosis not present

## 2020-01-01 DIAGNOSIS — Z6841 Body Mass Index (BMI) 40.0 and over, adult: Secondary | ICD-10-CM

## 2020-01-01 DIAGNOSIS — Z01818 Encounter for other preprocedural examination: Secondary | ICD-10-CM | POA: Diagnosis not present

## 2020-01-01 MED ORDER — CLENPIQ 10-3.5-12 MG-GM -GM/160ML PO SOLN: 1.0000 | Freq: Once | ORAL | 0 refills | Status: AC

## 2020-01-01 NOTE — Patient Instructions (Signed)
You have been scheduled for an endoscopy and colonoscopy. Please follow the written instructions given to you at your visit today. Please pick up your prep supplies at the pharmacy within the next 1-3 days. If you use inhalers (even only as needed), please bring them with you on the day of your procedure. Your physician has requested that you go to www.startemmi.com and enter the access code given to you at your visit today. This web site gives a general overview about your procedure. However, you should still follow specific instructions given to you by our office regarding your preparation for the procedure.  It was a pleasure to see you today!  Vito Cirigliano, D.O.

## 2020-01-01 NOTE — Progress Notes (Signed)
Chief Complaint: Epigastric pain, melena  Referring Provider:     Silverio Decamp, MD   HPI:     Kelsey Snow is a 54 y.o. female with a prior hx of PUD, HTN, HLD, Obesity (BMI 45.9), referred to the [facility] for evaluation of epigastric pain, melena.   She states she has had recent onset MEG pain and episodic melenic stools. She was seen by her PCM, Dr. [name], via virtual appt for this issue on 12/07/19, with sxs present for approx 1 week at that time.  Sxs started after eating a jalepeno burger. Did have associated diarrhea x3 weeks or so. Labs were unremarkable (normal CBC, CMP, amylase, lipase, PT/INR) and was prescribed on omeprazole 40 mg BID and Carafate (never picked up) and referred to GI.  Today, she states her pain has essentially resolved now. No recurrence. In total, had 2-3 episodes of dark stool along with the 3 weeks of diarrhea. No fevers, n/v.   Has a hx of GERD, which is o/w well controlled with omeprazole 40 mg/day. No dysphagia. EGD years ago at [facility] with PUD.   Drinks gin and tonic on weekends- 5-8 drinks/night. Rare drinks during the week. No hx of DUI/DWI, withdrawal sxs, hospitalizations, etc. no known history of liver disease.  Additionally, she wants to discuss CRC screening. ColoGuard in 2018 negative, and would now like to proceed with optical colonoscopy. No previous colonoscopy.   Father adopted. Possible maternal uncle and MGM with Pancreatic CA. No known family history of CRC, GI malignancy, liver disease, or IBD.     Past Medical History:  Diagnosis Date  . Anemia   . Essential hypertension, benign 03/07/2015  . GERD (gastroesophageal reflux disease)   . Hx of ectopic pregnancy   . Hyperlipidemia   . Lumbosacral spondylolysis   . Obese   . OCD (obsessive compulsive disorder)   . Thyroid disease      Past Surgical History:  Procedure Laterality Date  . BACK SURGERY    . ECTOPIC PREGNANCY  SURGERY    . TUBAL LIGATION    . TUBAL LIGATION     Family History  Problem Relation Age of Onset  . Aneurysm Father   . AAA (abdominal aortic aneurysm) Father   . Depression Mother   . Cancer Mother        Pancreatic  . Thyroid disease Mother        hypothyroidism  . Varicose Veins Mother   . Drug abuse Sister   . Hyperlipidemia Brother   . Diabetes Maternal Grandmother   . Alcohol abuse Maternal Uncle    Social History   Tobacco Use  . Smoking status: Never Smoker  . Smokeless tobacco: Never Used  Substance Use Topics  . Alcohol use: Yes    Alcohol/week: 12.0 - 13.0 standard drinks    Types: 6 - 7 Glasses of wine, 6 Standard drinks or equivalent per week  . Drug use: No   Current Outpatient Medications  Medication Sig Dispense Refill  . aspirin 81 MG EC tablet TAKE 1 TABLET (81 MG TOTAL) BY MOUTH DAILY. 90 tablet 3  . Cholecalciferol (VITAMIN D3) 2000 UNITS TABS Take by mouth 2 (two) times a week.     Marland Kitchen FLUoxetine (PROZAC) 40 MG capsule TAKE ONE CAPSULE BY MOUTH DAILY 30 capsule 2  . levothyroxine (SYNTHROID) 137 MCG tablet Take 1 tablet (137 mcg total) by mouth daily before  breakfast. 90 tablet 3  . omeprazole (PRILOSEC) 40 MG capsule TAKE ONE CAPSULE BY MOUTH DAILY 90 capsule 1  . rosuvastatin (CRESTOR) 10 MG tablet TAKE ONE TABLET BY MOUTH DAILY 90 tablet 0  . traMADol (ULTRAM-ER) 200 MG 24 hr tablet Take 200 mg by mouth daily.     . traZODone (DESYREL) 100 MG tablet TAKE ONE TABLET BY MOUTH EVERY NIGHT AT BEDTIME 90 tablet 1   No current facility-administered medications for this visit.   Allergies  Allergen Reactions  . Bee Venom Anaphylaxis  . Topiramate Other (See Comments)     Review of Systems: All systems reviewed and negative except where noted in HPI.     Physical Exam:    Wt Readings from Last 3 Encounters:  01/01/20 267 lb 6 oz (121.3 kg)  12/14/19 260 lb (117.9 kg)  12/07/19 249 lb (112.9 kg)    Temp (!) 97.1 F (36.2 C)   Ht 5\' 4"   (1.626 m)   Wt 267 lb 6 oz (121.3 kg)   BMI 45.89 kg/m  Constitutional:  Pleasant, in no acute distress. Psychiatric: Normal mood and affect. Behavior is normal. EENT: Pupils normal.  Conjunctivae are normal. No scleral icterus. Neck supple. No cervical LAD. Cardiovascular: Normal rate, regular rhythm. No edema Pulmonary/chest: Effort normal and breath sounds normal. No wheezing, rales or rhonchi. Abdominal: Soft, nondistended, nontender. Bowel sounds active throughout. There are no masses palpable. No hepatomegaly. Neurological: Alert and oriented to person place and time. Skin: Skin is warm and dry. No rashes noted.   ASSESSMENT AND PLAN;   1) Colon cancer Screening: -Cologuard negative in 2018.  Due for ongoing, age-appropriate, average risk screening -Schedule colonoscopy  2) History of GERD 3) Recent melena and epigastric pain -EGD for Barrett's screening along with evaluation of recent pain and melenic stools -Gastric biopsies -Resume current acid suppression therapy -Resume antireflux lifestyle/dietary modifications -Discussed relationship of obesity and alcohol with reflux  4) Obesity (BMI 45.9): -Discussed the overlap between obesity reflux.  Has gained approximately 50 pounds this year  5) Family history of pancreatic cancer -Possible family history of pancreatic cancer with maternal uncle and MGM.  Does not technically meet guidelines to initiate Pancreatic Cancer screening.  Discussed this today, and based on guidelines and patient personal preference, does not want to initiate imaging/EUS  6) Excessive EtOH intake -Counseled on excess EtOH intake.  Liver enzymes and synthetic function otherwise see normal/preserved.  Discussed complete cessation, which she is not interested in.  Discussed cutting down, which she would like to try  The indications, risks, and benefits of EGD and colonoscopy were explained to the patient in detail. Risks include but are not limited to  bleeding, perforation, adverse reaction to medications, and cardiopulmonary compromise. Sequelae include but are not limited to the possibility of surgery, hositalization, and mortality. The patient verbalized understanding and wished to proceed. All questions answered, referred to scheduler and bowel prep ordered. Further recommendations pending results of the exam.     Lavena Bullion, DO, FACG  01/01/2020, 8:57 AM   Silverio Decamp,*

## 2020-01-04 ENCOUNTER — Other Ambulatory Visit: Payer: Self-pay | Admitting: Sports Medicine

## 2020-01-04 DIAGNOSIS — R1013 Epigastric pain: Secondary | ICD-10-CM

## 2020-01-10 ENCOUNTER — Ambulatory Visit (INDEPENDENT_AMBULATORY_CARE_PROVIDER_SITE_OTHER): Payer: 59

## 2020-01-10 ENCOUNTER — Other Ambulatory Visit: Payer: Self-pay | Admitting: Gastroenterology

## 2020-01-10 ENCOUNTER — Ambulatory Visit (INDEPENDENT_AMBULATORY_CARE_PROVIDER_SITE_OTHER): Payer: 59 | Admitting: Sports Medicine

## 2020-01-10 ENCOUNTER — Other Ambulatory Visit: Payer: Self-pay

## 2020-01-10 DIAGNOSIS — G5603 Carpal tunnel syndrome, bilateral upper limbs: Secondary | ICD-10-CM | POA: Diagnosis not present

## 2020-01-10 DIAGNOSIS — Z9889 Other specified postprocedural states: Secondary | ICD-10-CM | POA: Insufficient documentation

## 2020-01-10 DIAGNOSIS — Z1159 Encounter for screening for other viral diseases: Secondary | ICD-10-CM

## 2020-01-10 LAB — SARS CORONAVIRUS 2 (TAT 6-24 HRS): SARS Coronavirus 2: NEGATIVE

## 2020-01-10 NOTE — Progress Notes (Signed)
    Procedures performed today:    Procedure: Real-time Ultrasound Guided hydrodissection of the left median nerve at the carpal tunnel Device: Samsung HS60 Verbal informed consent obtained.  Time-out conducted.  Noted no overlying erythema, induration, or other signs of local infection.  Skin prepped in a sterile fashion.  Local anesthesia: Topical Ethyl chloride.  With sterile technique and under real time ultrasound guidance: Using a 25-gauge needle advanced into the carpal tunnel, taking care to avoid intraneural injection I injected medication both superficial to and deep to the median nerve freeing it from surrounding structures, I then redirected the needle deep and injected further medication around the flexor tendons deep within the carpal tunnel for a total of 1 cc kenalog 40, 5 cc 1% lidocaine without epinephrine. Completed without difficulty  Advised to call if fevers/chills, erythema, induration, drainage, or persistent bleeding.  Images permanently stored and available for review in the ultrasound unit.  Impression: Technically successful ultrasound guided median nerve hydrodissection.  Procedure: Real-time Ultrasound Guided hydrodissection of the right median nerve at the carpal tunnel Device: Samsung HS60 Verbal informed consent obtained.  Time-out conducted.  Noted no overlying erythema, induration, or other signs of local infection.  Skin prepped in a sterile fashion.  Local anesthesia: Topical Ethyl chloride.  With sterile technique and under real time ultrasound guidance: Using a 25-gauge needle advanced into the carpal tunnel, taking care to avoid intraneural injection I injected medication both superficial to and deep to the median nerve freeing it from surrounding structures, I then redirected the needle deep and injected further medication around the flexor tendons deep within the carpal tunnel for a total of 1 cc kenalog 40, 5 cc 1% lidocaine without  epinephrine. Completed without difficulty  Advised to call if fevers/chills, erythema, induration, drainage, or persistent bleeding.  Images permanently stored and available for review in the ultrasound unit.  Impression: Technically successful ultrasound guided median nerve hydrodissection.  Independent interpretation of notes and tests performed by another provider:   None.  Impression and Recommendations:    Carpal tunnel syndrome, bilateral Repeat bilateral median nerve hydrodissection, last done in November 2020. She had worsening of symptoms, return as needed for this.    ___________________________________________ [name]. [name], M.D., ABFM., CAQSM. Primary Care and [employer] of [employer] [facility]

## 2020-01-10 NOTE — Assessment & Plan Note (Signed)
Repeat bilateral median nerve hydrodissection, last done in November 2020. She had worsening of symptoms, return as needed for this.

## 2020-01-12 ENCOUNTER — Ambulatory Visit (AMBULATORY_SURGERY_CENTER): Payer: 59 | Admitting: Gastroenterology

## 2020-01-12 ENCOUNTER — Encounter: Payer: Self-pay | Admitting: Gastroenterology

## 2020-01-12 ENCOUNTER — Other Ambulatory Visit: Payer: Self-pay

## 2020-01-12 VITALS — BP 136/74 | HR 55 | Temp 97.8°F | Resp 55 | Ht 64.0 in | Wt 267.0 lb

## 2020-01-12 DIAGNOSIS — K219 Gastro-esophageal reflux disease without esophagitis: Secondary | ICD-10-CM

## 2020-01-12 DIAGNOSIS — K635 Polyp of colon: Secondary | ICD-10-CM | POA: Diagnosis not present

## 2020-01-12 DIAGNOSIS — K449 Diaphragmatic hernia without obstruction or gangrene: Secondary | ICD-10-CM | POA: Diagnosis not present

## 2020-01-12 DIAGNOSIS — Z1211 Encounter for screening for malignant neoplasm of colon: Secondary | ICD-10-CM

## 2020-01-12 DIAGNOSIS — K259 Gastric ulcer, unspecified as acute or chronic, without hemorrhage or perforation: Secondary | ICD-10-CM | POA: Diagnosis not present

## 2020-01-12 DIAGNOSIS — K2970 Gastritis, unspecified, without bleeding: Secondary | ICD-10-CM

## 2020-01-12 DIAGNOSIS — D122 Benign neoplasm of ascending colon: Secondary | ICD-10-CM | POA: Diagnosis not present

## 2020-01-12 DIAGNOSIS — R1013 Epigastric pain: Secondary | ICD-10-CM | POA: Diagnosis not present

## 2020-01-12 DIAGNOSIS — K317 Polyp of stomach and duodenum: Secondary | ICD-10-CM | POA: Diagnosis not present

## 2020-01-12 DIAGNOSIS — D125 Benign neoplasm of sigmoid colon: Secondary | ICD-10-CM

## 2020-01-12 DIAGNOSIS — K640 First degree hemorrhoids: Secondary | ICD-10-CM

## 2020-01-12 MED ORDER — SODIUM CHLORIDE 0.9 % IV SOLN: 500.0000 mL | Freq: Once | INTRAVENOUS | Status: DC

## 2020-01-12 MED ORDER — OMEPRAZOLE 40 MG PO CPDR: DELAYED_RELEASE_CAPSULE | ORAL | 1 refills | Status: DC

## 2020-01-12 NOTE — Progress Notes (Signed)
Called to room to assist during endoscopic procedure.  Patient ID and intended procedure confirmed with present staff. Received instructions for my participation in the procedure from the performing physician.  

## 2020-01-12 NOTE — Patient Instructions (Signed)
handout on polyps and hemorrhoids given to you today  Await pathology results on polyps removed   Handout on gastritis given to you today  Await biopsy results from stomach   Increase Omeprazole to 40 mg twice a day for 8 weeks then back to once daily   YOU HAD AN ENDOSCOPIC PROCEDURE TODAY AT [facility]:   Refer to the procedure report that was given to you for any specific questions about what was found during the examination.  If the procedure report does not answer your questions, please call your gastroenterologist to clarify.  If you requested that your care partner not be given the details of your procedure findings, then the procedure report has been included in a sealed envelope for you to review at your convenience later.  YOU SHOULD EXPECT: Some feelings of bloating in the abdomen. Passage of more gas than usual.  Walking can help get rid of the air that was put into your GI tract during the procedure and reduce the bloating. If you had a lower endoscopy (such as a colonoscopy or flexible sigmoidoscopy) you may notice spotting of blood in your stool or on the toilet paper. If you underwent a bowel prep for your procedure, you may not have a normal bowel movement for a few days.  Please Note:  You might notice some irritation and congestion in your nose or some drainage.  This is from the oxygen used during your procedure.  There is no need for concern and it should clear up in a day or so.  SYMPTOMS TO REPORT IMMEDIATELY:   Following lower endoscopy (colonoscopy or flexible sigmoidoscopy):  Excessive amounts of blood in the stool  Significant tenderness or worsening of abdominal pains  Swelling of the abdomen that is new, acute  Fever of 100F or higher   Following upper endoscopy (EGD)  Vomiting of blood or coffee ground material  New chest pain or pain under the shoulder blades  Painful or persistently difficult swallowing  New shortness of  breath  Fever of 100F or higher  Black, tarry-looking stools  For urgent or emergent issues, a gastroenterologist can be reached at any hour by calling [phone]. Do not use MyChart messaging for urgent concerns.    DIET:  We do recommend a small meal at first, but then you may proceed to your regular diet.  Drink plenty of fluids but you should avoid alcoholic beverages for 24 hours.  ACTIVITY:  You should plan to take it easy for the rest of today and you should NOT DRIVE or use heavy machinery until tomorrow (because of the sedation medicines used during the test).    FOLLOW UP: Our staff will call the number listed on your records 48-72 hours following your procedure to check on you and address any questions or concerns that you may have regarding the information given to you following your procedure. If we do not reach you, we will leave a message.  We will attempt to reach you two times.  During this call, we will ask if you have developed any symptoms of COVID 19. If you develop any symptoms (ie: fever, flu-like symptoms, shortness of breath, cough etc.) before then, please call [phone].  If you test positive for Covid 19 in the 2 weeks post procedure, please call and report this information to [place].    If any biopsies were taken you will be contacted by phone or by letter within the next 1-3 weeks.  Please call us at [phone] if you have not heard about the biopsies in 3 weeks.    SIGNATURES/CONFIDENTIALITY: You and/or your care partner have signed paperwork which will be entered into your electronic medical record.  These signatures attest to the fact that that the information above on your After Visit Summary has been reviewed and is understood.  Full responsibility of the confidentiality of this discharge information lies with you and/or your care-partner.

## 2020-01-12 NOTE — Progress Notes (Signed)
[place]  Pt states she "has redness to chest and a headache since taking prep."  Dr. Bryan Lemma made aware.

## 2020-01-12 NOTE — Op Note (Signed)
[place] Patient Name: Kelsey Snow Procedure Date: 01/12/2020 1:17 PM MRN: [id number] Endoscopist: Gerrit Heck , MD Age: 54 Referring MD:  Date of Birth: 1966-07-31 Gender: Female Account #: [id number] Procedure:                Upper GI endoscopy Indications:              Epigastric abdominal pain, Suspected esophageal                            reflux, Melena Medicines:                Monitored Anesthesia Care Procedure:                Pre-Anesthesia Assessment:                           - Prior to the procedure, a History and Physical                            was performed, and patient medications and                            allergies were reviewed. The patient's tolerance of                            previous anesthesia was also reviewed. The risks                            and benefits of the procedure and the sedation                            options and risks were discussed with the patient.                            All questions were answered, and informed consent                            was obtained. Prior Anticoagulants: The patient has                            taken no previous anticoagulant or antiplatelet                            agents. ASA Grade Assessment: III - A patient with                            severe systemic disease. After reviewing the risks                            and benefits, the patient was deemed in                            satisfactory condition to undergo the procedure.  After obtaining informed consent, the endoscope was                            passed under direct vision. Throughout the                            procedure, the patient's blood pressure, pulse, and                            oxygen saturations were monitored continuously. The                            Endoscope was introduced through the mouth, and                            advanced to the second part of duodenum.  The upper                            GI endoscopy was accomplished without difficulty.                            The patient tolerated the procedure well. Scope In: Scope Out: Findings:                 Esophagogastric landmarks were identified: the                            Z-line was found at 35 cm, the gastroesophageal                            junction was found at 35 cm and the site of hiatal                            narrowing was found at 40 cm from the incisors.                           A 5 cm hiatal hernia was present.                           The upper third of the esophagus and middle third                            of the esophagus were normal.                           Localized mild inflammation characterized by                            erosions and erythema was found in the gastric body                            and in the gastric antrum. Biopsies were taken with  a cold forceps for Helicobacter pylori testing.                            Estimated blood loss was minimal.                           A single 3 mm sessile polyp with no stigmata of                            recent bleeding was found in the gastric body. The                            polyp was removed with a cold biopsy forceps.                            Resection and retrieval were complete. Estimated                            blood loss was minimal.                           The gastric fundus, incisura and pylorus were                            normal.                           The duodenal bulb, first portion of the duodenum                            and second portion of the duodenum were normal. Complications:            No immediate complications. Estimated Blood Loss:     Estimated blood loss was minimal. Impression:               - Esophagogastric landmarks identified.                           - 5 cm hiatal hernia.                           - Normal upper third  of esophagus and middle third                            of esophagus.                           - Gastritis. Biopsied.                           - A single gastric polyp. Resected and retrieved.                           - Normal gastric fundus, incisura and pylorus.                           -  Normal duodenal bulb, first portion of the                            duodenum and second portion of the duodenum. Recommendation:           - Patient has a contact number available for                            emergencies. The signs and symptoms of potential                            delayed complications were discussed with the                            patient. Return to normal activities tomorrow.                            Written discharge instructions were provided to the                            patient.                           - Resume previous diet.                           - Continue present medications.                           - Await pathology results.                           - Increase Prilosec (omeprazole) to 40 mg PO BID                            for 8 weeks to promote mucosal healing, then reduce                            back to 40 mg daily for ongoing control of reflux.                           - Perform a colonoscopy today. Gerrit Heck, MD 01/12/2020 2:09:42 PM

## 2020-01-12 NOTE — Progress Notes (Signed)
pt tolerated well. VSS. awake and to recovery. Report given to RN. Oral bite block placed and removed with ease. Atraumatic. Oral airway removed without complication. Atraumatic.

## 2020-01-12 NOTE — Op Note (Signed)
[place] Patient Name: Kelsey Snow Procedure Date: 01/12/2020 1:17 PM MRN: [id number] Endoscopist: Gerrit Heck , MD Age: 54 Referring MD:  Date of Birth: 17-Jun-1966 Gender: Female Account #: [id number] Procedure:                Colonoscopy Indications:              Screening for colorectal malignant neoplasm                           Cologuard negative in 2018. Medicines:                Monitored Anesthesia Care Procedure:                Pre-Anesthesia Assessment:                           - Prior to the procedure, a History and Physical                            was performed, and patient medications and                            allergies were reviewed. The patient's tolerance of                            previous anesthesia was also reviewed. The risks                            and benefits of the procedure and the sedation                            options and risks were discussed with the patient.                            All questions were answered, and informed consent                            was obtained. Prior Anticoagulants: The patient has                            taken no previous anticoagulant or antiplatelet                            agents. ASA Grade Assessment: III - A patient with                            severe systemic disease. After reviewing the risks                            and benefits, the patient was deemed in                            satisfactory condition to undergo the procedure.  After obtaining informed consent, the colonoscope                            was passed under direct vision. Throughout the                            procedure, the patient's blood pressure, pulse, and                            oxygen saturations were monitored continuously. The                            Colonoscope was introduced through the anus and                            advanced to the the terminal ileum. The  colonoscopy                            was performed without difficulty. The patient                            tolerated the procedure well. The quality of the                            bowel preparation was good. The terminal ileum,                            ileocecal valve, appendiceal orifice, and rectum                            were photographed. Scope In: 1:31:51 PM Scope Out: 2:00:42 PM Scope Withdrawal Time: 0 hours 15 minutes 31 seconds  Total Procedure Duration: 0 hours 28 minutes 51 seconds  Findings:                 The perianal and digital rectal examinations were                            normal.                           Three sessile polyps were found in the ascending                            colon. The polyps were 3 to 6 mm in size. These                            polyps were removed with a cold snare. Resection                            and retrieval were complete. Estimated blood loss                            was minimal.  A 3 mm polyp was found in the sigmoid colon. The                            polyp was sessile. The polyp was removed with a                            cold snare. Resection and retrieval were complete.                            Estimated blood loss was minimal.                           Non-bleeding internal hemorrhoids and hypertrophied                            anal papillae were found during retroflexion. The                            hemorrhoids were small.                           The terminal ileum appeared normal.                           The splenic flexure and ascending colon were                            moderately tortuous. Advancing the scope required                            using manual pressure. Complications:            No immediate complications. Estimated Blood Loss:     Estimated blood loss was minimal. Impression:               - Three 3 to 6 mm polyps in the ascending colon,                             removed with a cold snare. Resected and retrieved.                           - One 3 mm polyp in the sigmoid colon, removed with                            a cold snare. Resected and retrieved.                           - Non-bleeding internal hemorrhoids and                            hypertrophied anal papillae.                           - The examined portion of the ileum was normal.                           -  Tortuous colon. Recommendation:           - Patient has a contact number available for                            emergencies. The signs and symptoms of potential                            delayed complications were discussed with the                            patient. Return to normal activities tomorrow.                            Written discharge instructions were provided to the                            patient.                           - Resume previous diet.                           - Continue present medications.                           - Await pathology results.                           - Repeat colonoscopy for surveillance based on                            pathology results.                           - Return to GI office PRN. Gerrit Heck, MD 01/12/2020 2:14:03 PM

## 2020-01-16 ENCOUNTER — Telehealth: Payer: Self-pay | Admitting: *Deleted

## 2020-01-16 NOTE — Telephone Encounter (Signed)
First attempt, left VM.  

## 2020-01-16 NOTE — Telephone Encounter (Signed)
  Follow up Call-  Call back number 01/12/2020  Post procedure Call Back phone  # [phone]  Permission to leave phone message Yes  Some recent data might be hidden     Patient questions:  Do you have a fever, pain , or abdominal swelling? No. Pain Score  0 *  Have you tolerated food without any problems? Yes.    Have you been able to return to your normal activities? Yes.    Do you have any questions about your discharge instructions: Diet   No. Medications  No. Follow up visit  No.  Do you have questions or concerns about your Care? No.  Actions: * If pain score is 4 or above: No action needed, pain <4  1. Have you developed a fever since your procedure? NO  2.   Have you had an respiratory symptoms (SOB or cough) since your procedure? NO  3.   Have you tested positive for COVID 19 since your procedure NO  4.   Have you had any family members/close contacts diagnosed with the COVID 19 since your procedure?  NO   If yes to any of these questions please route to Joylene John, RN and Erenest Rasher, RN

## 2020-01-17 ENCOUNTER — Encounter: Payer: Self-pay | Admitting: Gastroenterology

## 2020-01-17 DIAGNOSIS — M533 Sacrococcygeal disorders, not elsewhere classified: Secondary | ICD-10-CM | POA: Diagnosis not present

## 2020-01-17 DIAGNOSIS — M961 Postlaminectomy syndrome, not elsewhere classified: Secondary | ICD-10-CM | POA: Diagnosis not present

## 2020-01-17 DIAGNOSIS — G894 Chronic pain syndrome: Secondary | ICD-10-CM | POA: Diagnosis not present

## 2020-02-03 ENCOUNTER — Other Ambulatory Visit: Payer: Self-pay | Admitting: Sports Medicine

## 2020-02-11 ENCOUNTER — Other Ambulatory Visit: Payer: Self-pay | Admitting: Sports Medicine

## 2020-02-11 DIAGNOSIS — F422 Mixed obsessional thoughts and acts: Secondary | ICD-10-CM

## 2020-02-14 ENCOUNTER — Other Ambulatory Visit: Payer: Self-pay | Admitting: *Deleted

## 2020-02-14 DIAGNOSIS — I7771 Dissection of carotid artery: Secondary | ICD-10-CM

## 2020-02-20 ENCOUNTER — Telehealth (HOSPITAL_COMMUNITY): Payer: Self-pay

## 2020-02-20 NOTE — Telephone Encounter (Signed)
The above patient or their representative was contacted and gave the following answers to these questions:         Do you have any of the following symptoms?    NO  Fever                    Cough                   Shortness of breath  Do  you have any of the following other symptoms?    muscle pain         vomiting,        diarrhea        rash         weakness        red eye        abdominal pain         bruising          bruising or bleeding              joint pain           severe headache    Have you been in contact with someone who was or has been sick in the past 2 weeks?  NO  Yes                 Unsure                         Unable to assess   Does the person that you were in contact with have any of the following symptoms?   Cough         shortness of breath           muscle pain         vomiting,            diarrhea            rash            weakness           fever            red eye           abdominal pain           bruising  or  bleeding                joint pain                severe headache                 COMMENTS OR ACTION PLAN FOR THIS PATIENT:        PT IS FULLY VACCINATIED/[facility]

## 2020-02-21 ENCOUNTER — Ambulatory Visit (HOSPITAL_COMMUNITY)
Admission: RE | Admit: 2020-02-21 | Discharge: 2020-02-21 | Disposition: A | Discharge status: Home / Self Care | Payer: 59 | Source: Ambulatory Visit | Attending: Vascular Surgery | Admitting: Vascular Surgery

## 2020-02-21 ENCOUNTER — Other Ambulatory Visit: Payer: Self-pay

## 2020-02-21 ENCOUNTER — Ambulatory Visit (INDEPENDENT_AMBULATORY_CARE_PROVIDER_SITE_OTHER): Payer: 59 | Admitting: Physician Assistant

## 2020-02-21 VITALS — BP 160/90 | HR 57 | Temp 97.1°F | Resp 16 | Ht 64.0 in | Wt 258.2 lb

## 2020-02-21 DIAGNOSIS — I6523 Occlusion and stenosis of bilateral carotid arteries: Secondary | ICD-10-CM

## 2020-02-21 DIAGNOSIS — I7771 Dissection of carotid artery: Secondary | ICD-10-CM
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
## 2020-02-23 ENCOUNTER — Other Ambulatory Visit: Payer: Self-pay | Admitting: *Deleted

## 2020-02-23 DIAGNOSIS — I7771 Dissection of carotid artery: Secondary | ICD-10-CM

## 2020-02-23 DIAGNOSIS — I6523 Occlusion and stenosis of bilateral carotid arteries: Secondary | ICD-10-CM

## 2020-03-12 ENCOUNTER — Ambulatory Visit (INDEPENDENT_AMBULATORY_CARE_PROVIDER_SITE_OTHER): Payer: 59 | Admitting: Sports Medicine

## 2020-03-12 ENCOUNTER — Other Ambulatory Visit: Payer: Self-pay

## 2020-03-12 DIAGNOSIS — E7800 Pure hypercholesterolemia, unspecified: Secondary | ICD-10-CM | POA: Diagnosis not present

## 2020-03-12 DIAGNOSIS — M5136 Other intervertebral disc degeneration, lumbar region: Secondary | ICD-10-CM

## 2020-03-12 DIAGNOSIS — M51369 Other intervertebral disc degeneration, lumbar region without mention of lumbar back pain or lower extremity pain: Secondary | ICD-10-CM

## 2020-03-12 DIAGNOSIS — I6523 Occlusion and stenosis of bilateral carotid arteries: Secondary | ICD-10-CM

## 2020-03-12 IMAGING — MG MM DIGITAL SCREENING BILAT W/ TOMO W/ CAD
8 series · 8 of 24 positions shown · non-contrast
Comparison: Previous exam(s).

CLINICAL DATA: Screening.

EXAM:
DIGITAL SCREENING BILATERAL MAMMOGRAM WITH TOMO AND CAD

[R CC synth-2D]
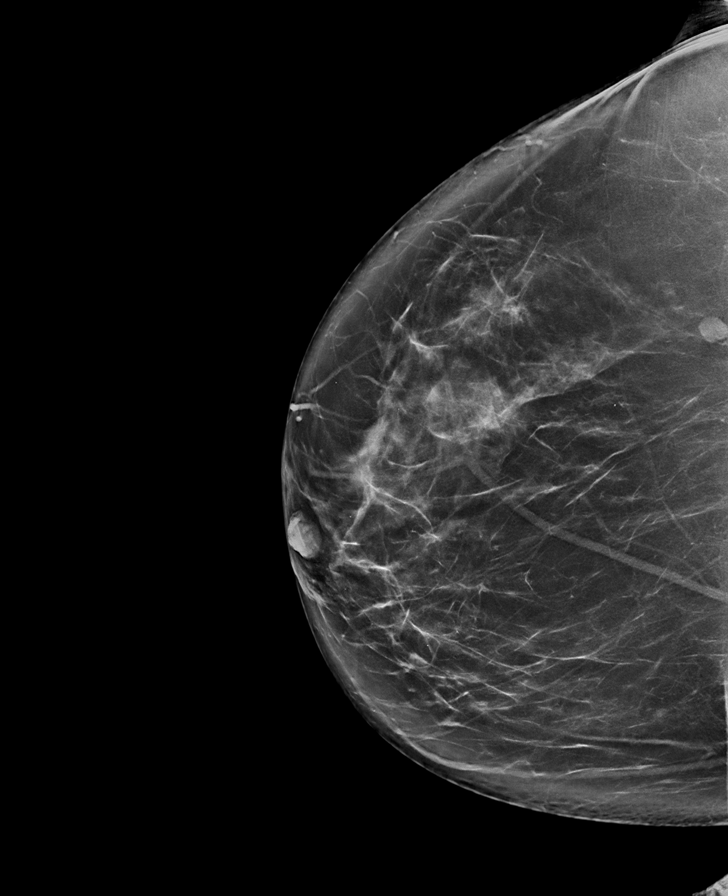

[L MLO synth-2D]
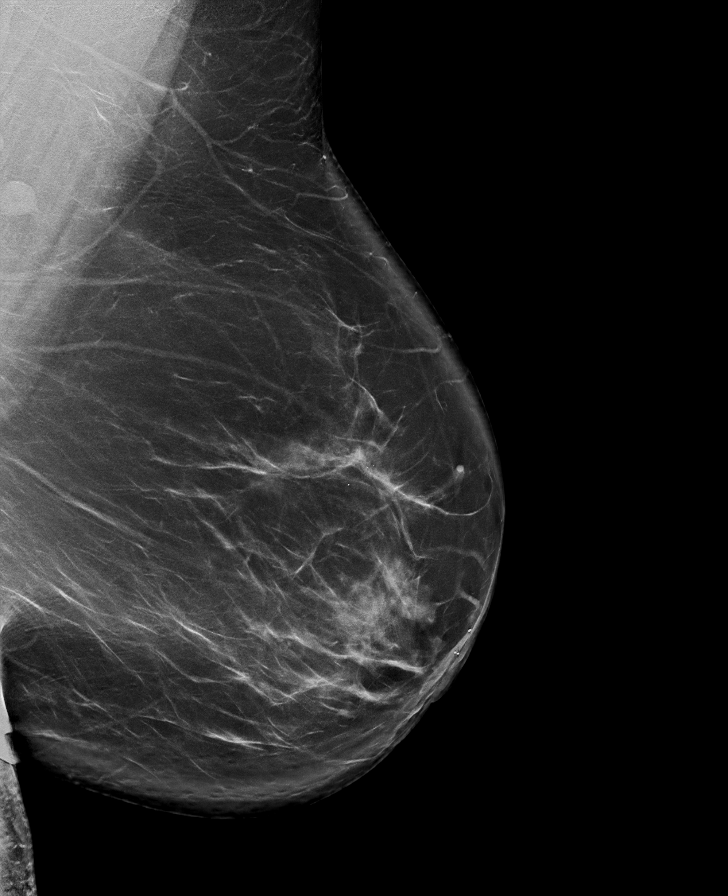

[L CC synth-2D]
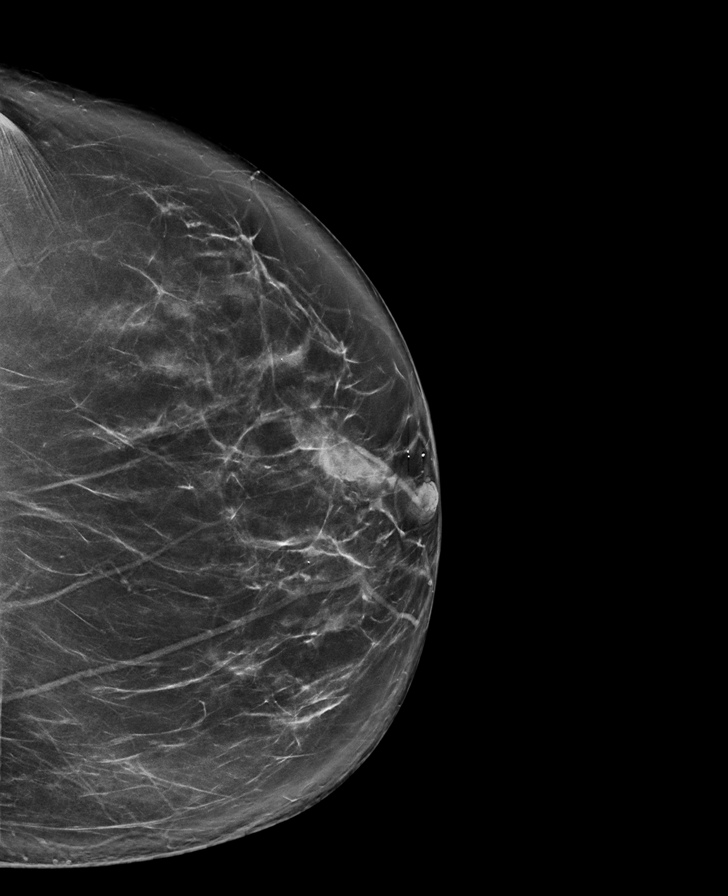

[R MLO synth-2D]
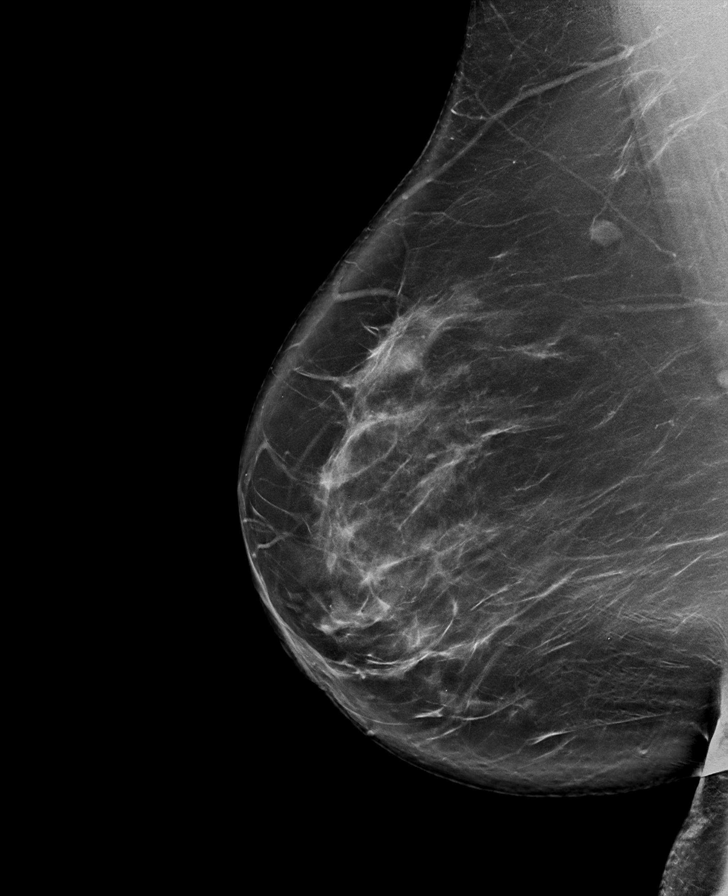

[L MLO tomo · tomo slice 52/103.0]
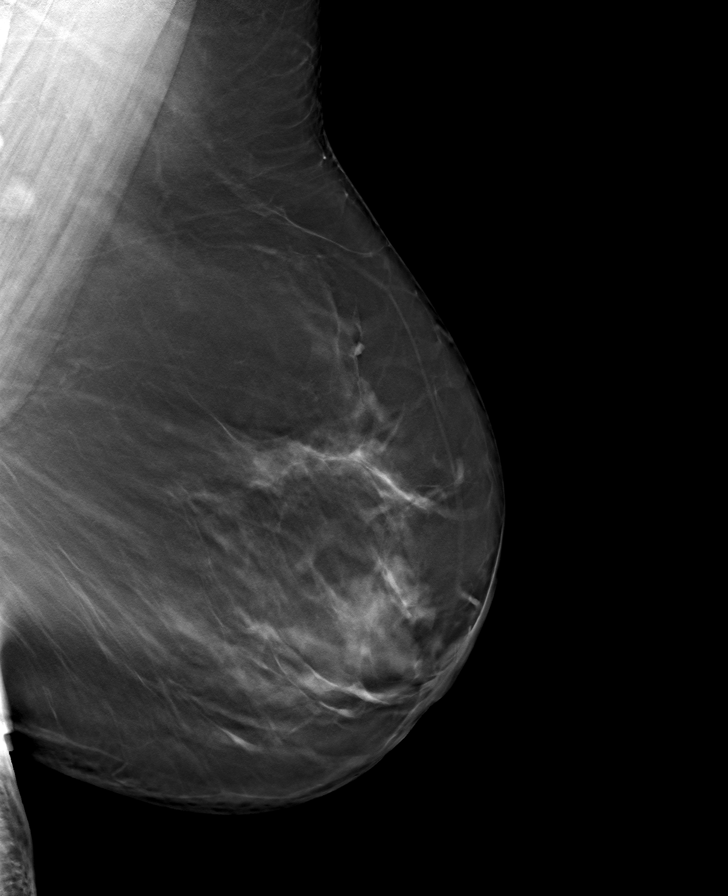

[L CC tomo · tomo slice 43/85.0]
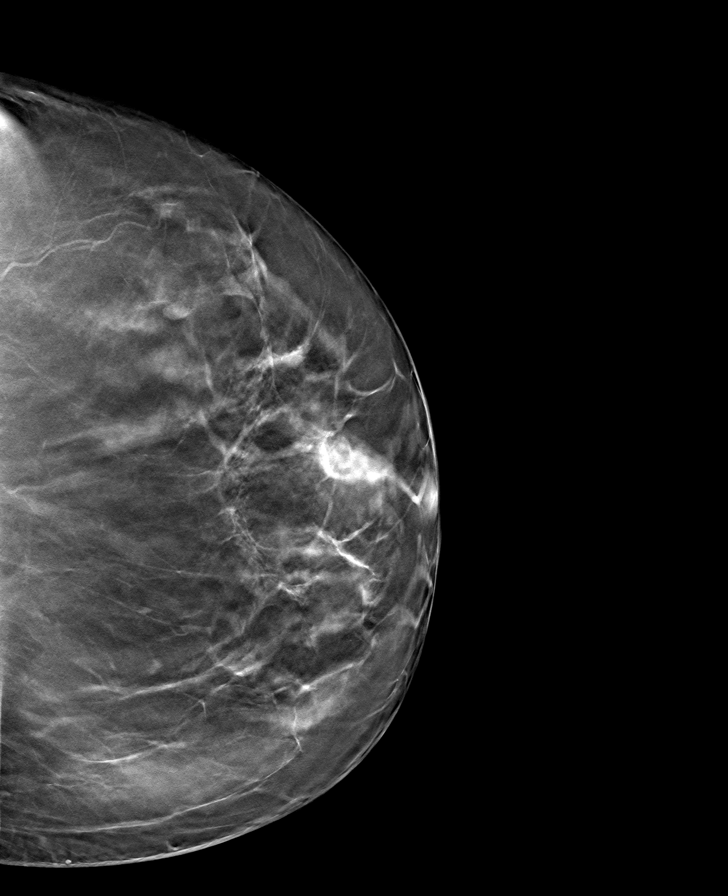

[R MLO tomo · tomo slice 51/101.0]
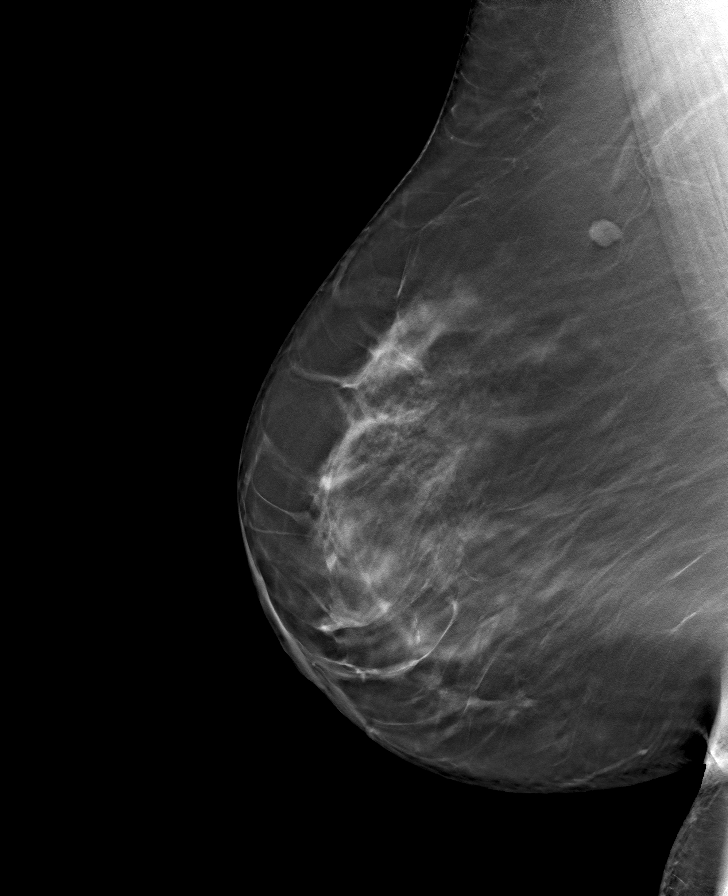

[R CC tomo · tomo slice 46/91.0]
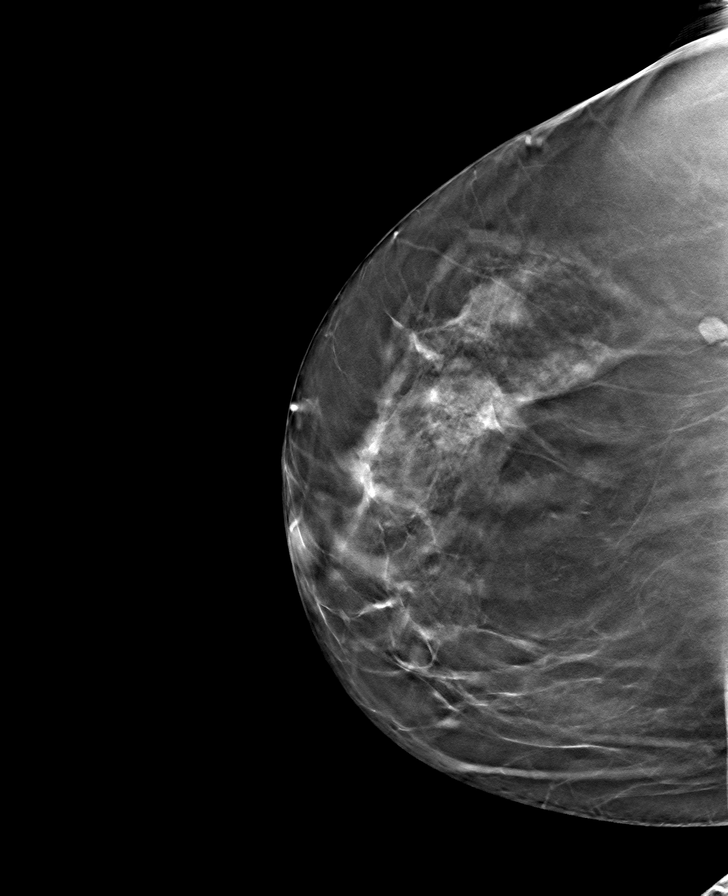

[8 of 24 positions shown; findings below may reference images not displayed]

ACR Breast Density Category b: There are scattered areas of
fibroglandular density.
FINDINGS: There are no findings suspicious for malignancy. Images were
processed with CAD.
IMPRESSION: No mammographic evidence of malignancy. A result letter of this
screening mammogram will be mailed directly to the patient.

RECOMMENDATION:
Screening mammogram in one year. (Code:[id number])

BI-RADS CATEGORY  1: Negative.

## 2020-03-12 NOTE — Progress Notes (Signed)
    Procedures performed today:    None.  Independent interpretation of notes and tests performed by another provider:   None.  Brief History, Exam, Impression, and Recommendations:    Lumbar degenerative disc disease Kelsey Snow returns, he is under the management of pain clinic, we were for some reason given for disability paperwork filled out. This was filled out today. Return as needed for this.  I spent 30 minutes of total time managing this patient today, this includes chart review, face to face, and non-face to face time.   ___________________________________________ [name]. [name], M.D., ABFM., CAQSM. Primary Care and [place] Instructor of [place] of [facility] of Medicine

## 2020-03-12 NOTE — Assessment & Plan Note (Signed)
Kelsey Snow returns, he is under the management of pain clinic, we were for some reason given for disability paperwork filled out. This was filled out today. Return as needed for this.

## 2020-05-04 ENCOUNTER — Other Ambulatory Visit: Payer: Self-pay | Admitting: Sports Medicine

## 2020-05-09 ENCOUNTER — Encounter: Payer: Self-pay | Admitting: Sports Medicine

## 2020-05-09 ENCOUNTER — Ambulatory Visit (INDEPENDENT_AMBULATORY_CARE_PROVIDER_SITE_OTHER): Payer: 59 | Admitting: Sports Medicine

## 2020-05-09 DIAGNOSIS — G5603 Carpal tunnel syndrome, bilateral upper limbs: Secondary | ICD-10-CM | POA: Diagnosis not present

## 2020-05-09 DIAGNOSIS — I6523 Occlusion and stenosis of bilateral carotid arteries: Secondary | ICD-10-CM

## 2020-05-09 DIAGNOSIS — F5101 Primary insomnia: Secondary | ICD-10-CM | POA: Diagnosis not present

## 2020-05-09 DIAGNOSIS — G4700 Insomnia, unspecified: Secondary | ICD-10-CM | POA: Insufficient documentation

## 2020-05-09 NOTE — Assessment & Plan Note (Signed)
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

## 2020-05-09 NOTE — Progress Notes (Signed)
    Procedures performed today:    Procedure: Real-time Ultrasound Guided hydrodissection of the right median nerve at the carpal tunnel Device: Samsung HS60 Verbal informed consent obtained.  Time-out conducted.  Noted no overlying erythema, induration, or other signs of local infection.  Skin prepped in a sterile fashion.  Local anesthesia: Topical Ethyl chloride.  With sterile technique and under real time ultrasound guidance: Using a 25-gauge needle advanced into the carpal tunnel, taking care to avoid intraneural injection I injected medication both superficial to and deep to the median nerve freeing it from surrounding structures, I then redirected the needle deep and injected further medication around the flexor tendons deep within the carpal tunnel for a total of 1 cc kenalog 40, 5 cc 1% lidocaine without epinephrine. Completed without difficulty  Advised to call if fevers/chills, erythema, induration, drainage, or persistent bleeding.  Images permanently stored and available for review in the ultrasound unit.  Impression: Technically successful ultrasound guided median nerve hydrodissection.  Independent interpretation of notes and tests performed by another provider:   None.  Brief History, Exam, Impression, and Recommendations:    Carpal tunnel syndrome, bilateral This is a pleasant 54 year old female, she has known bilateral carpal tunnel syndrome, the last hydrodissection was done bilaterally in March of this year. She is now having recurrence of symptoms on the right, repeat right-sided median nerve Hydro dissection.  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    ___________________________________________ [name]. [name], M.D., ABFM., CAQSM. Primary Care and [place] Instructor of [place] of [facility] of Medicine

## 2020-05-09 NOTE — Assessment & Plan Note (Signed)
This is a pleasant 54 year old female, she has known bilateral carpal tunnel syndrome, the last hydrodissection was done bilaterally in March of this year. She is now having recurrence of symptoms on the right, repeat right-sided median nerve Hydro dissection.

## 2020-05-10 LAB — COMPLETE METABOLIC PANEL WITH GFR
AG Ratio: 1.5 (calc) (ref 1.0–2.5)
ALT: 22 U/L (ref 6–29)
AST: 19 U/L (ref 10–35)
Albumin: 4.3 g/dL (ref 3.6–5.1)
Alkaline phosphatase (APISO): 53 U/L (ref 37–153)
BUN: 12 mg/dL (ref 7–25)
CO2: 28 mmol/L (ref 20–32)
Calcium: 9.9 mg/dL (ref 8.6–10.4)
Chloride: 102 mmol/L (ref 98–110)
Creat: 0.82 mg/dL (ref 0.50–1.05)
GFR, Est African American: 95 mL/min/{1.73_m2} (ref >=60)
GFR, Est Non African American: 82 mL/min/{1.73_m2} (ref >=60)
Globulin: 2.8 g/dL (calc) (ref 1.9–3.7)
Glucose, Bld: 99 mg/dL (ref 65–99)
Potassium: 4.7 mmol/L (ref 3.5–5.3)
Sodium: 139 mmol/L (ref 135–146)
Total Bilirubin: 0.3 mg/dL (ref 0.2–1.2)
Total Protein: 7.1 g/dL (ref 6.1–8.1)

## 2020-05-10 LAB — TSH: TSH: 1.5 mIU/L

## 2020-05-10 LAB — LIPID PANEL W/REFLEX DIRECT LDL
Cholesterol: 197 mg/dL (ref <200)
HDL: 75 mg/dL (ref >=50)
LDL Cholesterol (Calc): 104 mg/dL (calc) — ABNORMAL HIGH
Non-HDL Cholesterol (Calc): 122 mg/dL (calc) (ref <130)
Total CHOL/HDL Ratio: 2.6 (calc) (ref <5.0)
Triglycerides: 89 mg/dL (ref <150)

## 2020-05-10 LAB — HEMOGLOBIN A1C
Hgb A1c MFr Bld: 6 % of total Hgb — ABNORMAL HIGH (ref <5.7)
Mean Plasma Glucose: 126 (calc)
eAG (mmol/L): 7 (calc)

## 2020-05-10 LAB — VITAMIN D 25 HYDROXY (VIT D DEFICIENCY, FRACTURES): Vit D, 25-Hydroxy: 45 ng/mL (ref 30–100)

## 2020-05-10 LAB — CBC
HCT: 41.8 % (ref 35.0–45.0)
Hemoglobin: 13.3 g/dL (ref 11.7–15.5)
MCH: 29 pg (ref 27.0–33.0)
MCHC: 31.8 g/dL — ABNORMAL LOW (ref 32.0–36.0)
MCV: 91.3 fL (ref 80.0–100.0)
MPV: 10.1 fL (ref 7.5–12.5)
Platelets: 312 10*3/uL (ref 140–400)
RBC: 4.58 10*6/uL (ref 3.80–5.10)
RDW: 13.2 % (ref 11.0–15.0)
WBC: 6.4 10*3/uL (ref 3.8–10.8)

## 2020-05-13 ENCOUNTER — Telehealth (INDEPENDENT_AMBULATORY_CARE_PROVIDER_SITE_OTHER): Payer: 59 | Admitting: Sports Medicine

## 2020-05-13 DIAGNOSIS — F5101 Primary insomnia: Secondary | ICD-10-CM | POA: Diagnosis not present

## 2020-05-13 DIAGNOSIS — I6523 Occlusion and stenosis of bilateral carotid arteries: Secondary | ICD-10-CM

## 2020-05-13 MED ORDER — MELATONIN 3 MG PO TABS: 3.0000 mg | ORAL_TABLET | Freq: Every day | ORAL | 0 refills | Status: DC

## 2020-05-13 MED ORDER — ZOLPIDEM TARTRATE 5 MG PO TABS: 5.0000 mg | ORAL_TABLET | Freq: Every evening | ORAL | 1 refills | Status: DC | PRN

## 2020-05-13 NOTE — Assessment & Plan Note (Signed)
Has not noticed much improvement with decreasing screen use, adding low-dose Ambien, she is going to try a bit of melatonin first. We can discuss this at a future visit.

## 2020-05-13 NOTE — Progress Notes (Signed)
   Virtual Visit via WebEx/MyChart   I connected with  Kelsey Snow  on 05/13/20 via WebEx/MyChart/Doximity Video and verified that I am speaking with the correct person using two identifiers.   I discussed the limitations, risks, security and privacy concerns of performing an evaluation and management service by WebEx/MyChart/Doximity Video, including the higher likelihood of inaccurate diagnosis and treatment, and the availability of in person appointments.  We also discussed the likely need of an additional face to face encounter for complete and high quality delivery of care.  I also discussed with the patient that there may be a patient responsible charge related to this service. The patient expressed understanding and wishes to proceed.  Provider location is in medical facility. Patient location is at their home, different from provider location. People involved in care of the patient during this telehealth encounter were myself, my nurse/medical assistant, and my front office/scheduling team member.  Review of Systems: No fevers, chills, night sweats, weight loss, chest pain, or shortness of breath.   Objective Findings:    General: Speaking full sentences, no audible heavy breathing.  Sounds alert and appropriately interactive.  Appears well.  Face symmetric.  Extraocular movements intact.  Pupils equal and round.  No nasal flaring or accessory muscle use visualized.  Independent interpretation of tests performed by another provider:   None.  Brief History, Exam, Impression, and Recommendations:    Insomnia Has not noticed much improvement with decreasing screen use, adding low-dose Ambien, she is going to try a bit of melatonin first. We can discuss this at a future visit.   I discussed the above assessment and treatment plan with the patient. The patient was provided an opportunity to ask questions and all were answered. The patient agreed with the plan and demonstrated an  understanding of the instructions.   The patient was advised to call back or seek an in-person evaluation if the symptoms worsen or if the condition fails to improve as anticipated.   I provided 30 minutes of face to face and non-face-to-face time during this encounter date, time was needed to gather information, review chart, records, communicate/coordinate with staff remotely, as well as complete documentation.   ___________________________________________ [name]. [name], M.D., ABFM., CAQSM. Primary Care and [place] Instructor of [place] of [facility] of Medicine

## 2020-05-25 ENCOUNTER — Other Ambulatory Visit: Payer: Self-pay | Admitting: Sports Medicine

## 2020-05-27 ENCOUNTER — Ambulatory Visit (INDEPENDENT_AMBULATORY_CARE_PROVIDER_SITE_OTHER): Payer: 59 | Admitting: Sports Medicine

## 2020-05-27 DIAGNOSIS — D1801 Hemangioma of skin and subcutaneous tissue: Secondary | ICD-10-CM

## 2020-05-27 DIAGNOSIS — I6523 Occlusion and stenosis of bilateral carotid arteries: Secondary | ICD-10-CM

## 2020-05-27 DIAGNOSIS — D1800 Hemangioma unspecified site: Secondary | ICD-10-CM | POA: Insufficient documentation

## 2020-05-27 MED ORDER — TRAZODONE HCL 300 MG PO TABS: 300.0000 mg | ORAL_TABLET | Freq: Every day | ORAL | 3 refills | Status: DC

## 2020-05-27 NOTE — Assessment & Plan Note (Signed)
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

## 2020-05-27 NOTE — Progress Notes (Signed)
    Procedures performed today:    None.  Independent interpretation of notes and tests performed by another provider:   None.  Brief History, Exam, Impression, and Recommendations:    Kelsey Snow is a pleasant 54yo female who presents for evaluation of skin spots. She has had these red spots for a while with minimal change. They are red non-raised non-blanching lesions. There is one on the right breast, left breast, and stomach. These are classic for hemangiomas. There is no intervention needed.   Marcelino Duster, MS3   ___________________________________________ [name]. [name], M.D., ABFM., CAQSM. Primary Care and [place] Instructor of [place] of [facility] of Medicine

## 2020-06-18 ENCOUNTER — Other Ambulatory Visit: Payer: Self-pay | Admitting: Sports Medicine

## 2020-06-18 DIAGNOSIS — Z1231 Encounter for screening mammogram for malignant neoplasm of breast: Secondary | ICD-10-CM

## 2020-07-11 ENCOUNTER — Other Ambulatory Visit: Payer: Self-pay

## 2020-07-11 ENCOUNTER — Ambulatory Visit (INDEPENDENT_AMBULATORY_CARE_PROVIDER_SITE_OTHER): Payer: 59

## 2020-07-11 DIAGNOSIS — Z1231 Encounter for screening mammogram for malignant neoplasm of breast: Secondary | ICD-10-CM | POA: Diagnosis not present

## 2020-07-24 ENCOUNTER — Ambulatory Visit (INDEPENDENT_AMBULATORY_CARE_PROVIDER_SITE_OTHER): Payer: Medicare Other | Admitting: Sports Medicine

## 2020-07-24 ENCOUNTER — Ambulatory Visit (INDEPENDENT_AMBULATORY_CARE_PROVIDER_SITE_OTHER): Payer: Medicare Other

## 2020-07-24 ENCOUNTER — Other Ambulatory Visit: Payer: Self-pay

## 2020-07-24 DIAGNOSIS — M79671 Pain in right foot: Secondary | ICD-10-CM

## 2020-07-24 DIAGNOSIS — I6523 Occlusion and stenosis of bilateral carotid arteries: Secondary | ICD-10-CM | POA: Diagnosis not present

## 2020-07-24 DIAGNOSIS — G5603 Carpal tunnel syndrome, bilateral upper limbs: Secondary | ICD-10-CM

## 2020-07-24 DIAGNOSIS — Z23 Encounter for immunization: Secondary | ICD-10-CM | POA: Diagnosis not present

## 2020-07-24 DIAGNOSIS — S99921A Unspecified injury of right foot, initial encounter: Secondary | ICD-10-CM | POA: Diagnosis not present

## 2020-07-24 NOTE — Assessment & Plan Note (Signed)
Injury with swelling at the right first MTP. Getting some x-rays, postop shoe, follow-up depends on x-ray results.

## 2020-07-24 NOTE — Assessment & Plan Note (Signed)
Right side was hydrodissected 05/09/2020. Hydrodissected the left side today. Return as needed.

## 2020-07-24 NOTE — Progress Notes (Signed)
    Procedures performed today:    Procedure: Real-time Ultrasound Guided hydrodissection of the left median nerve at the carpal tunnel Device: Samsung HS60 Verbal informed consent obtained.  Time-out conducted.  Noted no overlying erythema, induration, or other signs of local infection.  Skin prepped in a sterile fashion.  Local anesthesia: Topical Ethyl chloride.  With sterile technique and under real time ultrasound guidance: Using a 25-gauge needle advanced into the carpal tunnel, taking care to avoid intraneural injection I injected medication both superficial to and deep to the median nerve freeing it from surrounding structures, I then redirected the needle deep and injected further medication around the flexor tendons deep within the carpal tunnel for a total of 1 cc kenalog 40, 5 cc 1% lidocaine without epinephrine. Completed without difficulty  Advised to call if fevers/chills, erythema, induration, drainage, or persistent bleeding.  Images permanently stored and available for review in PACS.  Impression: Technically successful ultrasound guided median nerve hydrodissection.  Independent interpretation of notes and tests performed by another provider:   None.  Brief History, Exam, Impression, and Recommendations:    Carpal tunnel syndrome, bilateral Right side was hydrodissected 05/09/2020. Hydrodissected the left side today. Return as needed.  Right foot injury Injury with swelling at the right first MTP. Getting some x-rays, postop shoe, follow-up depends on x-ray results.    ___________________________________________ [name]. [name], M.D., ABFM., CAQSM. Primary Care and [place] Instructor of [place] of [facility] of Medicine

## 2020-08-02 ENCOUNTER — Other Ambulatory Visit: Payer: Self-pay | Admitting: Sports Medicine

## 2020-08-04 ENCOUNTER — Other Ambulatory Visit: Payer: Self-pay | Admitting: Sports Medicine

## 2020-08-04 DIAGNOSIS — F422 Mixed obsessional thoughts and acts: Secondary | ICD-10-CM

## 2020-08-12 ENCOUNTER — Ambulatory Visit (INDEPENDENT_AMBULATORY_CARE_PROVIDER_SITE_OTHER): Payer: Medicare Other

## 2020-08-12 ENCOUNTER — Ambulatory Visit (INDEPENDENT_AMBULATORY_CARE_PROVIDER_SITE_OTHER): Payer: Medicare Other | Admitting: Sports Medicine

## 2020-08-12 ENCOUNTER — Ambulatory Visit: Payer: Medicare Other | Admitting: Sports Medicine

## 2020-08-12 DIAGNOSIS — L989 Disorder of the skin and subcutaneous tissue, unspecified: Secondary | ICD-10-CM

## 2020-08-12 DIAGNOSIS — G5603 Carpal tunnel syndrome, bilateral upper limbs: Secondary | ICD-10-CM

## 2020-08-12 DIAGNOSIS — I6523 Occlusion and stenosis of bilateral carotid arteries: Secondary | ICD-10-CM

## 2020-08-12 NOTE — Progress Notes (Signed)
    Procedures performed today:    Procedure: Real-time Ultrasound Guided hydrodissection of the right median nerve at the carpal tunnel Device: Samsung HS60 Verbal informed consent obtained.  Time-out conducted.  Noted no overlying erythema, induration, or other signs of local infection.  Skin prepped in a sterile fashion.  Local anesthesia: Topical Ethyl chloride.  With sterile technique and under real time ultrasound guidance: Noted normal-appearing median nerve.  Using a 25-gauge needle advanced into the carpal tunnel, taking care to avoid intraneural injection I injected medication both superficial to and deep to the median nerve freeing it from surrounding structures, I then redirected the needle deep and injected further medication around the flexor tendons deep within the carpal tunnel for a total of 1 cc kenalog 40, 5 cc 1% lidocaine without epinephrine. Completed without difficulty  Advised to call if fevers/chills, erythema, induration, drainage, or persistent bleeding.  Images permanently stored and available for review in PACS.  Impression: Technically successful ultrasound guided median nerve hydrodissection.  Independent interpretation of notes and tests performed by another provider:   None.  Brief History, Exam, Impression, and Recommendations:    Carpal tunnel syndrome, bilateral Left side less hydrodissected 07/24/2020, worsening of right hand numbness and tingling, we made the decision to hydrodissect the right side today, return as needed.  Lesion of dorsum of nose She also noticed a small subcentimeter possibly 1 mm papule over the dorsum of her nose, this looks nonspecific, we will watch this for now, if it does enlarge we can consider cryotherapy.    ___________________________________________ [name]. [name], M.D., ABFM., CAQSM. Primary Care and [place] Instructor of [place] of  [facility] of Medicine

## 2020-08-12 NOTE — Assessment & Plan Note (Addendum)
Left side less hydrodissected 07/24/2020, worsening of right hand numbness and tingling, we made the decision to hydrodissect the right side today, return as needed.

## 2020-08-12 NOTE — Assessment & Plan Note (Signed)
She also noticed a small subcentimeter possibly 1 mm papule over the dorsum of her nose, this looks nonspecific, we will watch this for now, if it does enlarge we can consider cryotherapy.

## 2020-08-31 ENCOUNTER — Other Ambulatory Visit: Payer: Self-pay | Admitting: Sports Medicine

## 2020-10-04 ENCOUNTER — Encounter: Payer: Self-pay | Admitting: Nurse Practitioner

## 2020-10-04 ENCOUNTER — Other Ambulatory Visit: Payer: Self-pay

## 2020-10-04 ENCOUNTER — Ambulatory Visit (INDEPENDENT_AMBULATORY_CARE_PROVIDER_SITE_OTHER): Payer: 59 | Admitting: Nurse Practitioner

## 2020-10-04 VITALS — BP 141/81 | HR 79 | Temp 97.7°F | Ht 64.0 in | Wt 266.5 lb

## 2020-10-04 DIAGNOSIS — T63451A Toxic effect of venom of hornets, accidental (unintentional), initial encounter: Secondary | ICD-10-CM

## 2020-10-04 DIAGNOSIS — T63441A Toxic effect of venom of bees, accidental (unintentional), initial encounter: Secondary | ICD-10-CM

## 2020-10-04 DIAGNOSIS — T63461A Toxic effect of venom of wasps, accidental (unintentional), initial encounter: Secondary | ICD-10-CM

## 2020-10-04 MED ORDER — HYDROXYZINE HCL 50 MG PO TABS: 50.0000 mg | ORAL_TABLET | Freq: Every evening | ORAL | 0 refills | Status: DC | PRN

## 2020-10-04 MED ORDER — MOMETASONE FUROATE 0.1 % EX CREA: 1.0000 "application " | TOPICAL_CREAM | Freq: Every day | CUTANEOUS | 0 refills | Status: DC

## 2020-10-04 MED ORDER — METHYLPREDNISOLONE ACETATE 80 MG/ML IJ SUSP
80.0000 mg | Freq: Once | INTRAMUSCULAR | Status: AC
  Administered 2020-10-04: 12:00:00 80 mg via INTRAMUSCULAR

## 2020-10-04 MED ORDER — PREDNISONE 20 MG PO TABS: ORAL_TABLET | ORAL | 0 refills | Status: DC

## 2020-10-04 NOTE — Patient Instructions (Addendum)
I recommend that you take Zyrtec (generic ok) daily for the next 5 days.  I will call in a prescription cream to be used on the area twice a day for no more than 7 days for itching and swelling. Use a thin layer. I have also called in a medication to help with itching while you sleep called hydroxyzine. You can take 1 to 2 tabs at bedtime.  I have called in steroids for you to start TOMORROW- the injection of steroid will last until then. You will take 2 tabs together in the morning with breakfast on the first and second day then 1 tab in the morning with breakfast for three more days.  Use ice on the area at least twice a day for 20 minutes at a time to help with inflammation.  Watch for worsening redness, inflammation, irritation, or pus from the area that can indicate possible infection.    Bee, Wasp, or [employer], Adult Bees, wasps, and hornets are part of a family of insects that can sting people. These stings can cause pain and inflammation, but they are usually not serious. However, some people may have an allergic reaction to a sting. This can cause the symptoms to be more severe. What increases the risk? You may be at a greater risk of getting stung if you:  Provoke a stinging insect by swatting or disturbing it.  Wear strong-smelling soaps, deodorants, or body sprays.  Spend time outdoors near gardens with flowers or fruit trees or in clothes that expose skin.  Eat or drink outside. What are the signs or symptoms? Common symptoms of this condition include:  A red lump in the skin that sometimes has a tiny hole in the center. In some cases, a stinger may be in the center of the wound.  Pain and itching at the sting site.  Redness and swelling around the sting site. If you have an allergic reaction (localized allergic reaction), the swelling and redness may spread out from the sting site. In some cases, this reaction can continue to develop over the next 24-48 hours. In rare  cases, a person may have a severe allergic reaction (anaphylactic reaction) to a sting. Symptoms of an anaphylactic reaction may include:  Wheezing or difficulty breathing.  Raised, itchy, red patches on the skin (hives).  Nausea or vomiting.  Abdominal cramping.  Diarrhea.  Tightness in the chest or chest pain.  Dizziness or fainting.  Redness of the face (flushing).  Hoarse voice.  Swollen tongue, lips, or face. How is this diagnosed? This condition is usually diagnosed based on your symptoms and medical history as well as a physical exam. You may have an allergy test to determine if you are allergic to the substance that the insect injected during the sting (venom). How is this treated? If you were stung by a bee, the stinger and a small sac of venom may be in the wound. It is important to remove the stinger as soon as possible. You can do this by brushing across the wound with gauze, a fingernail, or a flat card such as a credit card. Removing the stinger can help reduce the severity of your body's reaction to the sting. Most stings can be treated with:  Icing to reduce swelling in the area.  Medicines (antihistamines) to treat itching or an allergic reaction.  Medicines to help reduce pain. These may be medicines that you take by mouth, or medicated creams or lotions that you apply to your  skin. Pay close attention to your symptoms after you have been stung. If possible, have someone stay with you to make sure you do not have an allergic reaction. If you have any signs of an allergic reaction, call your health care provider. If you have ever had a severe allergic reaction, your health care provider may give you an inhaler or injectable medicine (epinephrine auto-injector) to use if necessary. Follow these instructions at home:   Wash the sting site 2-3 times each day with soap and water as told by your health care provider.  Apply or take over-the-counter and prescription  medicines only as told by your health care provider.  If directed, apply ice to the sting area. ? Put ice in a plastic bag. ? Place a towel between your skin and the bag. ? Leave the ice on for 20 minutes, 2-3 times a day.  Do not scratch the sting area.  If you had a severe allergic reaction to a sting, you may need: ? To wear a medical bracelet or necklace that lists the allergy. ? To learn when and how to use an anaphylaxis kit or epinephrine injection. Your family members and coworkers may also need to learn this. ? To carry an anaphylaxis kit or epinephrine injection with you at all times. How is this prevented?  Avoid swatting at stinging insects and disturbing insect nests.  Do not use fragrant soaps or lotions.  Wear shoes, pants, and long sleeves when spending time outdoors, especially in grassy areas where stinging insects are common.  Keep outdoor areas free from nests or hives.  Keep food and drink containers covered when eating outdoors.  Avoid working or sitting near [employer], if possible.  Wear gloves if you are gardening or working outdoors.  If an attack by a stinging insect or a swarm seems likely in the moment, move away from the area or find a barrier between you and the insect(s), such as a door. Contact a health care provider if:  Your symptoms do not get better in 2-3 days.  You have redness, swelling, or pain that spreads beyond the area of the sting.  You have a fever. Get help right away if: You have symptoms of a severe allergic reaction. These include:  Wheezing or difficulty breathing.  Tightness in the chest or chest pain.  Light-headedness or fainting.  Itchy, raised, red patches on the skin.  Nausea or vomiting.  Abdominal cramping.  Diarrhea.  A swollen tongue or lips, or trouble swallowing.  Dizziness or fainting. Summary  Stings from bees, wasps, and hornets can cause pain and inflammation, but they are usually not  serious. However, some people may have an allergic reaction to a sting. This can cause the symptoms to be more severe.  Pay close attention to your symptoms after you have been stung. If possible, have someone stay with you to make sure you do not have an allergic reaction.  Call your health care provider if you have any signs of an allergic reaction. This information is not intended to replace advice given to you by your health care provider. Make sure you discuss any questions you have with your health care provider. Document Revised: 09/30/2017 Document Reviewed: 12/10/2016 Elsevier Patient Education  2020 [employer].

## 2020-10-04 NOTE — Progress Notes (Signed)
Acute Office Visit  Subjective:    Patient ID: Kelsey Snow, female    DOB: [birth year], 54 y.o.   MRN: [id number]  Chief Complaint  Patient presents with   Insect Bite    HPI Patient is in today for sting on the right medial surface of the proximal portion of the upper extremity two days ago. She believes it was a mud dobber based on internet search of the insect. She removed the stinger.  She reports initially the area was painful, red, and swollen at the injection site, but with time the erythema, inflammation, pain, and itching have worsened. She reports that she is having difficulty sleeping due to the burning sensation. She has also had a sensation of upset stomach with a little diarrhea, decreased appetite, and headache.  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   She denies shortness of breath, chest pain, rash over other area on the body, swelling of her lips, tongue, throat, voice changes, vomiting, or dizziness.   Past Medical History:  Diagnosis Date   Anemia    Anxiety    Arthritis    Carpal tunnel syndrome    Depression    Essential hypertension, benign 03/07/2015   GERD (gastroesophageal reflux disease)    History of degenerative disc disease    Hx of ectopic pregnancy    Hyperlipidemia    Lumbosacral spondylolysis    Obese    OCD (obsessive compulsive disorder)    Thyroid disease     Past Surgical History:  Procedure Laterality Date   BACK SURGERY     L5 to S1 discectomy   ECTOPIC PREGNANCY SURGERY     ENDOMETRIAL ABLATION     ESOPHAGOGASTRODUODENOSCOPY     in 20's had pepic ulcers    TUBAL LIGATION     TUBAL LIGATION     UPPER GASTROINTESTINAL ENDOSCOPY      Family History  Problem Relation Age of Onset   Aneurysm Father    AAA (abdominal aortic aneurysm) Father    Depression Mother    Cancer Mother        Pancreatic   Thyroid disease Mother        hypothyroidism   Varicose Veins Mother     Colon polyps Mother    Drug abuse Sister    Hyperlipidemia Brother    Diabetes Maternal Grandmother    Alcohol abuse Maternal Uncle    Esophageal cancer Neg Hx    Colon cancer Neg Hx    Rectal cancer Neg Hx    Stomach cancer Neg Hx     Social History   Socioeconomic History   Marital status: Married    Spouse name: Not on file   Number of children: 1   Years of education: 12   Highest education level: Not on file  Occupational History    Employer: [employer]  Tobacco Use   Smoking status: Never Smoker   Smokeless tobacco: Never Used  Scientific laboratory technician Use: Never used  Substance and Sexual Activity   Alcohol use: Yes    Alcohol/week: 12.0 - 13.0 standard drinks    Types: 6 - 7 Glasses of wine, 6 Standard drinks or equivalent per week   Drug use: No   Sexual activity: Not on file  Other Topics Concern   Not on file  Social History Narrative   Not on file   Social Determinants of Health   Financial Resource Strain: Not on  file  Food Insecurity: Not on file  Transportation Needs: Not on file  Physical Activity: Not on file  Stress: Not on file  Social Connections: Not on file  Intimate Partner Violence: Not on file    Outpatient Medications Prior to Visit  Medication Sig Dispense Refill   aspirin 81 MG EC tablet TAKE 1 TABLET (81 MG TOTAL) BY MOUTH DAILY. 90 tablet 3   Cholecalciferol (VITAMIN D3) 2000 UNITS TABS Take by mouth 2 (two) times a week.      FLUoxetine (PROZAC) 40 MG capsule TAKE ONE CAPSULE BY MOUTH DAILY 90 capsule 3   levothyroxine (SYNTHROID) 137 MCG tablet Take 1 tablet (137 mcg total) by mouth daily before breakfast. 90 tablet 3   omeprazole (PRILOSEC) 40 MG capsule TAKE ONE CAPSULE BY MOUTH EVERY MORNING AND AT BEDTIME 60 capsule 3   rosuvastatin (CRESTOR) 10 MG tablet TAKE ONE TABLET BY MOUTH DAILY 90 tablet 0   traMADol (ULTRAM-ER) 200 MG 24 hr tablet Take 200 mg by mouth daily.      traZODone (DESYREL)  300 MG tablet Take 1 tablet (300 mg total) by mouth at bedtime. 30 tablet 3   No facility-administered medications prior to visit.    Allergies  Allergen Reactions   Bee Venom Anaphylaxis   Topiramate Other (See Comments)    Review of Systems All review of systems negative except what is listed in the HPI     Objective:    Physical Exam Vitals and nursing note reviewed.  Constitutional:      Appearance: Normal appearance. She is obese.  HENT:     Head: Normocephalic.     Mouth/Throat:     Mouth: Mucous membranes are moist.     Pharynx: Oropharynx is clear.  Eyes:     Extraocular Movements: Extraocular movements intact.     Conjunctiva/sclera: Conjunctivae normal.     Pupils: Pupils are equal, round, and reactive to light.  Cardiovascular:     Rate and Rhythm: Normal rate and regular rhythm.     Pulses: Normal pulses.     Heart sounds: Normal heart sounds.  Pulmonary:     Effort: Pulmonary effort is normal.  Abdominal:     General: Abdomen is flat.     Palpations: Abdomen is soft.  Musculoskeletal:        General: Normal range of motion.     Cervical back: Normal range of motion. No rigidity.     Right lower leg: No edema.     Left lower leg: No edema.  Skin:    General: Skin is warm and dry.     Capillary Refill: Capillary refill takes less than 2 seconds.     Findings: Erythema and rash present.       Neurological:     General: No focal deficit present.     Mental Status: She is alert and oriented to person, place, and time.  Psychiatric:        Mood and Affect: Mood normal.        Behavior: Behavior normal.        Thought Content: Thought content normal.        Judgment: Judgment normal.     BP (!) 141/81    Pulse 79    Temp 97.7 F (36.5 C)    Ht 5\' 4"  (1.626 m)    Wt 266 lb 8 oz (120.9 kg)    SpO2 98%    BMI 45.74 kg/m  Wt Readings from  Last 3 Encounters:  10/04/20 266 lb 8 oz (120.9 kg)  02/21/20 258 lb 3.2 oz (117.1 kg)  01/12/20 267 lb  (121.1 kg)    There are no preventive care reminders to display for this patient.  There are no preventive care reminders to display for this patient.   Lab Results  Component Value Date   TSH 1.50 05/09/2020   Lab Results  Component Value Date   WBC 6.4 05/09/2020   HGB 13.3 05/09/2020   HCT 41.8 05/09/2020   MCV 91.3 05/09/2020   PLT 312 05/09/2020   Lab Results  Component Value Date   NA 139 05/09/2020   K 4.7 05/09/2020   CO2 28 05/09/2020   GLUCOSE 99 05/09/2020   BUN 12 05/09/2020   CREATININE 0.82 05/09/2020   BILITOT 0.3 05/09/2020   ALKPHOS 33 08/10/2016   AST 19 05/09/2020   ALT 22 05/09/2020   PROT 7.1 05/09/2020   ALBUMIN 3.7 08/10/2016   CALCIUM 9.9 05/09/2020   Lab Results  Component Value Date   CHOL 197 05/09/2020   Lab Results  Component Value Date   HDL 75 05/09/2020   Lab Results  Component Value Date   LDLCALC 104 (H) 05/09/2020   Lab Results  Component Value Date   TRIG 89 05/09/2020   Lab Results  Component Value Date   CHOLHDL 2.6 05/09/2020   Lab Results  Component Value Date   HGBA1C 6.0 (H) 05/09/2020       Assessment & Plan:   1. 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  There are no signs of respiratory or cardiac related reactions at the time of examination. Airway clear, breathing normally, with no facial edema noted. No tachycardia present. No fever. No wide spread hives or edema.   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  Recommend ice applied to the area at least twice a day for 20 minutes at a time and adding Zyrtec for the  next 5 days to her daily medications- she is already on a H2 inhibitor. Mometasone for topical relief BID for no more than 7 days. Hydroxyzine 50-100mg  at bedtime for itching and sleep.Prednisone, short taper, starting tomorrow with 40mg  x 2 days then 20mg  x 3 days.  Information provided on warning signs that would warrant immediate evaluation or follow-up.  - mometasone (ELOCON) 0.1 % cream; Apply 1 application topically daily.  Dispense: 45 g; Refill: 0 - predniSONE (DELTASONE) 20 MG tablet; Take 2 tabs by mouth on 12/18 and 12/19, the tab by mouth 12/20, 12/21, 12/22 then stop. Take with breakfast.  Dispense: 7 tablet; Refill: 0 - hydrOXYzine (ATARAX/VISTARIL) 50 MG tablet; Take 1 tablet (50 mg total) by mouth at bedtime and may repeat dose one time if needed. For itching.  Dispense: 30 tablet; Refill: 0 - methylPREDNISolone acetate (DEPO-MEDROL) injection 80 mg   Meds ordered this encounter  Medications   mometasone (ELOCON) 0.1 % cream    Sig: Apply 1 application topically daily.    Dispense:  45 g    Refill:  0   predniSONE (DELTASONE) 20 MG tablet    Sig: Take 2  tabs by mouth on 12/18 and 12/19, the tab by mouth 12/20, 12/21, 12/22 then stop. Take with breakfast.    Dispense:  7 tablet    Refill:  0   hydrOXYzine (ATARAX/VISTARIL) 50 MG tablet    Sig: Take 1 tablet (50 mg total) by mouth at bedtime and may repeat dose one time if needed. For itching.    Dispense:  30 tablet    Refill:  0   methylPREDNISolone acetate (DEPO-MEDROL) injection 80 mg     Orma Render, NP

## 2020-10-24 ENCOUNTER — Ambulatory Visit: Payer: 59 | Admitting: Sports Medicine

## 2020-10-30 ENCOUNTER — Other Ambulatory Visit: Payer: Self-pay | Admitting: Sports Medicine

## 2020-11-13 ENCOUNTER — Telehealth: Payer: Self-pay

## 2020-11-13 ENCOUNTER — Ambulatory Visit: Payer: 59 | Admitting: Sports Medicine

## 2020-11-13 DIAGNOSIS — H9319 Tinnitus, unspecified ear: Secondary | ICD-10-CM | POA: Insufficient documentation

## 2020-11-13 DIAGNOSIS — H9313 Tinnitus, bilateral: Secondary | ICD-10-CM

## 2020-11-13 NOTE — Telephone Encounter (Signed)
Patient reports that she started having ringing in her ears (bil) 2 weeks ago. It has since settled just in her right ear. It is quite bothersome and she wants to know what to do about it. Should she have some type of testing, etc? She was going to see you today but got stuck in traffic and had to cancel appt.

## 2020-11-13 NOTE — Telephone Encounter (Signed)
Happy to go ahead and get her set up with ENT.  Referral placed.

## 2020-11-13 NOTE — Telephone Encounter (Signed)
Patient aware referral was sent for ENT.

## 2020-11-18 ENCOUNTER — Ambulatory Visit (INDEPENDENT_AMBULATORY_CARE_PROVIDER_SITE_OTHER): Payer: 59

## 2020-11-18 ENCOUNTER — Ambulatory Visit (INDEPENDENT_AMBULATORY_CARE_PROVIDER_SITE_OTHER): Payer: 59 | Admitting: Sports Medicine

## 2020-11-18 ENCOUNTER — Other Ambulatory Visit: Payer: Self-pay

## 2020-11-18 DIAGNOSIS — E039 Hypothyroidism, unspecified: Secondary | ICD-10-CM | POA: Diagnosis not present

## 2020-11-18 DIAGNOSIS — G5603 Carpal tunnel syndrome, bilateral upper limbs: Secondary | ICD-10-CM

## 2020-11-18 NOTE — Assessment & Plan Note (Addendum)
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 I did perform bilateral median nerve Hydro dissections today with ultrasound guidance, return to see me as needed, considering the number of these procedures that we have done I would like her to get a surgical opinion from Dr. Amedeo Plenty.

## 2020-11-18 NOTE — Progress Notes (Addendum)
    Procedures performed today:    Procedure: Real-time Ultrasound Guided hydrodissection of the left median nerve at the carpal tunnel Device: Samsung HS60 Verbal informed consent obtained.  Time-out conducted.  Noted no overlying erythema, induration, or other signs of local infection.  Skin prepped in a sterile fashion.  Local anesthesia: Topical Ethyl chloride.  With sterile technique and under real time ultrasound guidance: Using a 25-gauge needle advanced into the carpal tunnel, taking care to avoid intraneural injection I injected medication both superficial to and deep to the median nerve freeing it from surrounding structures, I then redirected the needle deep and injected further medication around the flexor tendons deep within the carpal tunnel for a total of 1 cc kenalog 40, 5 cc 1% lidocaine without epinephrine. Completed without difficulty  Advised to call if fevers/chills, erythema, induration, drainage, or persistent bleeding.  Images permanently stored and available for review in PACS.  Impression: Technically successful ultrasound guided median nerve hydrodissection.  Procedure: Real-time Ultrasound Guided hydrodissection of the right median nerve at the carpal tunnel Device: Samsung HS60 Verbal informed consent obtained.  Time-out conducted.  Noted no overlying erythema, induration, or other signs of local infection.  Skin prepped in a sterile fashion.  Local anesthesia: Topical Ethyl chloride.  With sterile technique and under real time ultrasound guidance: Using a 25-gauge needle advanced into the carpal tunnel, taking care to avoid intraneural injection I injected medication both superficial to and deep to the median nerve freeing it from surrounding structures, I then redirected the needle deep and injected further medication around the flexor tendons deep within the carpal tunnel for a total of 1 cc kenalog 40, 5 cc 1% lidocaine without epinephrine. Completed without  difficulty  Advised to call if fevers/chills, erythema, induration, drainage, or persistent bleeding.  Images permanently stored and available for review in PACS.  Impression: Technically successful ultrasound guided median nerve hydrodissection.  Independent interpretation of notes and tests performed by another provider:   None.  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 I did perform bilateral median nerve Hydro dissections today with ultrasound guidance, return to see me as needed, considering the number of these procedures that we have done I would like her to get a surgical opinion from Dr. Amedeo Plenty.  Hypothyroidism TSH within normal range but at the very top of the normal range, increasing levothyroxine to 150 mcg, recheck TSH in 6 weeks. Goal TSH is less than 2.5.    ___________________________________________ [name]. [name], M.D., ABFM., CAQSM. Primary Care and [employer] [place] [facility]

## 2020-11-28 DIAGNOSIS — H9319 Tinnitus, unspecified ear: Secondary | ICD-10-CM | POA: Diagnosis not present

## 2020-11-28 DIAGNOSIS — H838X3 Other specified diseases of inner ear, bilateral: Secondary | ICD-10-CM | POA: Diagnosis not present

## 2020-11-28 DIAGNOSIS — H9311 Tinnitus, right ear: Secondary | ICD-10-CM | POA: Diagnosis not present

## 2020-11-28 DIAGNOSIS — J301 Allergic rhinitis due to pollen: Secondary | ICD-10-CM | POA: Diagnosis not present

## 2020-11-28 DIAGNOSIS — H905 Unspecified sensorineural hearing loss: Secondary | ICD-10-CM | POA: Diagnosis not present

## 2020-11-28 DIAGNOSIS — H903 Sensorineural hearing loss, bilateral: Secondary | ICD-10-CM | POA: Diagnosis not present

## 2020-11-29 ENCOUNTER — Other Ambulatory Visit: Payer: Self-pay | Admitting: Sports Medicine

## 2020-11-29 DIAGNOSIS — E039 Hypothyroidism, unspecified: Secondary | ICD-10-CM

## 2020-12-06 DIAGNOSIS — M533 Sacrococcygeal disorders, not elsewhere classified: Secondary | ICD-10-CM | POA: Diagnosis not present

## 2020-12-25 DIAGNOSIS — E039 Hypothyroidism, unspecified: Secondary | ICD-10-CM

## 2020-12-25 NOTE — Assessment & Plan Note (Signed)
Having hot flashes. Likely menopause, but we will check a TSH, CMP, CBC.

## 2020-12-31 LAB — COMPREHENSIVE METABOLIC PANEL
AG Ratio: 1.6 (calc) (ref 1.0–2.5)
ALT: 28 U/L (ref 6–29)
AST: 22 U/L (ref 10–35)
Albumin: 4.3 g/dL (ref 3.6–5.1)
Alkaline phosphatase (APISO): 59 U/L (ref 37–153)
BUN: 16 mg/dL (ref 7–25)
CO2: 28 mmol/L (ref 20–32)
Calcium: 9.8 mg/dL (ref 8.6–10.4)
Chloride: 103 mmol/L (ref 98–110)
Creat: 0.96 mg/dL (ref 0.50–1.05)
Globulin: 2.7 g/dL (calc) (ref 1.9–3.7)
Glucose, Bld: 85 mg/dL (ref 65–139)
Potassium: 4.7 mmol/L (ref 3.5–5.3)
Sodium: 141 mmol/L (ref 135–146)
Total Bilirubin: 0.4 mg/dL (ref 0.2–1.2)
Total Protein: 7 g/dL (ref 6.1–8.1)

## 2020-12-31 LAB — CBC
HCT: 42.8 % (ref 35.0–45.0)
Hemoglobin: 14 g/dL (ref 11.7–15.5)
MCH: 29.3 pg (ref 27.0–33.0)
MCHC: 32.7 g/dL (ref 32.0–36.0)
MCV: 89.5 fL (ref 80.0–100.0)
MPV: 10.2 fL (ref 7.5–12.5)
Platelets: 307 10*3/uL (ref 140–400)
RBC: 4.78 10*6/uL (ref 3.80–5.10)
RDW: 13.8 % (ref 11.0–15.0)
WBC: 6.7 10*3/uL (ref 3.8–10.8)

## 2020-12-31 LAB — T3, FREE: T3, Free: 2.6 pg/mL (ref 2.3–4.2)

## 2020-12-31 LAB — TSH: TSH: 4.39 mIU/L

## 2020-12-31 LAB — T4, FREE: Free T4: 1.4 ng/dL (ref 0.8–1.8)

## 2020-12-31 MED ORDER — LEVOTHYROXINE SODIUM 150 MCG PO TABS: 150.0000 ug | ORAL_TABLET | Freq: Every day | ORAL | 3 refills | Status: DC

## 2020-12-31 NOTE — Addendum Note (Signed)
Addended by: Silverio Decamp on: 12/31/2020 02:51 PM   Modules accepted: Orders

## 2020-12-31 NOTE — Assessment & Plan Note (Signed)
TSH within normal range but at the very top of the normal range, increasing levothyroxine to 150 mcg, recheck TSH in 6 weeks. Goal TSH is less than 2.5.

## 2021-01-01 DIAGNOSIS — G894 Chronic pain syndrome: Secondary | ICD-10-CM | POA: Diagnosis not present

## 2021-01-01 DIAGNOSIS — M47816 Spondylosis without myelopathy or radiculopathy, lumbar region: Secondary | ICD-10-CM | POA: Diagnosis not present

## 2021-01-01 DIAGNOSIS — M7918 Myalgia, other site: Secondary | ICD-10-CM | POA: Diagnosis not present

## 2021-01-01 DIAGNOSIS — M533 Sacrococcygeal disorders, not elsewhere classified: Secondary | ICD-10-CM | POA: Diagnosis not present

## 2021-01-01 DIAGNOSIS — M961 Postlaminectomy syndrome, not elsewhere classified: Secondary | ICD-10-CM | POA: Diagnosis not present

## 2021-01-28 ENCOUNTER — Other Ambulatory Visit: Payer: Self-pay | Admitting: Sports Medicine

## 2021-02-05 DIAGNOSIS — E039 Hypothyroidism, unspecified: Secondary | ICD-10-CM

## 2021-02-05 DIAGNOSIS — K219 Gastro-esophageal reflux disease without esophagitis: Secondary | ICD-10-CM

## 2021-02-05 MED ORDER — OMEPRAZOLE 40 MG PO CPDR: 40.0000 mg | DELAYED_RELEASE_CAPSULE | Freq: Every day | ORAL | 3 refills | Status: DC

## 2021-02-06 ENCOUNTER — Telehealth (INDEPENDENT_AMBULATORY_CARE_PROVIDER_SITE_OTHER): Payer: Medicare Other | Admitting: Sports Medicine

## 2021-02-06 DIAGNOSIS — E039 Hypothyroidism, unspecified: Secondary | ICD-10-CM

## 2021-02-06 DIAGNOSIS — F422 Mixed obsessional thoughts and acts: Secondary | ICD-10-CM | POA: Diagnosis not present

## 2021-02-06 MED ORDER — FLUOXETINE HCL 40 MG PO CAPS: 80.0000 mg | ORAL_CAPSULE | Freq: Every day | ORAL | 3 refills | Status: DC

## 2021-02-06 NOTE — Progress Notes (Addendum)
   Virtual Visit via Telephone   I connected with  Kelsey Snow  on 02/10/21 by telephone/telehealth and verified that I am speaking with the correct person using two identifiers.   I discussed the limitations, risks, security and privacy concerns of performing an evaluation and management service by telephone, including the higher likelihood of inaccurate diagnosis and treatment, and the availability of in person appointments.  We also discussed the likely need of an additional face to face encounter for complete and high quality delivery of care.  I also discussed with the patient that there may be a patient responsible charge related to this service. The patient expressed understanding and wishes to proceed.  Provider location is in medical facility. Patient location is at their home, different from provider location. People involved in care of the patient during this telehealth encounter were myself, my nurse/medical assistant, and my front office/scheduling team member.  Review of Systems: No fevers, chills, night sweats, weight loss, chest pain, or shortness of breath.   Objective Findings:    General: Speaking full sentences, no audible heavy breathing.  Sounds alert and appropriately interactive.    Independent interpretation of tests performed by another provider:   None.  Brief History, Exam, Impression, and Recommendations:    Obsessive compulsive disorder with depression and anxiety This pleasant 55 year old female with a history of anxiety, depression, obsessive-compulsive disorder is having increasing depressive symptoms with anhedonia, poor energy, increasing sleep. No suicidal or homicidal ideation. Snow husband is having some health issues as well which seems to compound Snow symptomatology. We will increase Prozac to 80 mg daily for the next 6 weeks, she is also get a get Snow thyroid function checked in the lab. I would like to touch base with Snow again in about 6 weeks  to reevaluate.  Hypothyroidism TSH is still a little bit low, indicating that we should probably decrease the levothyroxine dose again.  Down to 137 mcg at this time, Snow TSH was previously too high on 137 mcg so we will add Cytomel as well, with a TSH recheck in 6 weeks.   I discussed the above assessment and treatment plan with the patient. The patient was provided an opportunity to ask questions and all were answered. The patient agreed with the plan and demonstrated an understanding of the instructions.   The patient was advised to call back or seek an in-person evaluation if the symptoms worsen or if the condition fails to improve as anticipated.   I provided 30 minutes of face to face and non-face-to-face time during this encounter date, time was needed to gather information, review chart, records, communicate/coordinate with staff remotely, as well as complete documentation.   ___________________________________________ Kelsey Snow. Kelsey Snow, M.D., ABFM., CAQSM. Primary Care and Sports Medicine [facility]  Adjunct Professor of [place] [employer] [facility] of [employer]

## 2021-02-06 NOTE — Assessment & Plan Note (Signed)
This pleasant 55 year old female with a history of anxiety, depression, obsessive-compulsive disorder is having increasing depressive symptoms with anhedonia, poor energy, increasing sleep. No suicidal or homicidal ideation. Her husband is having some health issues as well which seems to compound her symptomatology. We will increase Prozac to 80 mg daily for the next 6 weeks, she is also get a get her thyroid function checked in the lab. I would like to touch base with her again in about 6 weeks to reevaluate.

## 2021-02-08 LAB — TSH: TSH: 0.09 mIU/L — ABNORMAL LOW

## 2021-02-10 MED ORDER — LIOTHYRONINE SODIUM 5 MCG PO TABS: 5.0000 ug | ORAL_TABLET | Freq: Every day | ORAL | 3 refills | Status: DC

## 2021-02-10 MED ORDER — LEVOTHYROXINE SODIUM 137 MCG PO TABS: 137.0000 ug | ORAL_TABLET | Freq: Every day | ORAL | 3 refills | Status: DC

## 2021-02-10 NOTE — Assessment & Plan Note (Signed)
TSH is still a little bit low, indicating that we should probably decrease the levothyroxine dose again.  Down to 137 mcg at this time, her TSH was previously too high on 137 mcg so we will add Cytomel as well, with a TSH recheck in 6 weeks.

## 2021-02-10 NOTE — Addendum Note (Signed)
Addended by: Silverio Decamp on: 02/10/2021 10:56 AM   Modules accepted: Orders

## 2021-02-21 ENCOUNTER — Other Ambulatory Visit: Payer: Self-pay

## 2021-02-21 ENCOUNTER — Ambulatory Visit (INDEPENDENT_AMBULATORY_CARE_PROVIDER_SITE_OTHER): Payer: Medicare Other | Admitting: Physician Assistant

## 2021-02-21 ENCOUNTER — Ambulatory Visit (HOSPITAL_COMMUNITY)
Admission: RE | Admit: 2021-02-21 | Discharge: 2021-02-21 | Disposition: A | Discharge status: Home / Self Care | Payer: Medicare Other | Source: Ambulatory Visit | Attending: Vascular Surgery | Admitting: Vascular Surgery

## 2021-02-21 VITALS — BP 135/84 | HR 72 | Temp 98.0°F | Resp 20 | Ht 64.0 in | Wt 264.5 lb

## 2021-02-21 DIAGNOSIS — I6523 Occlusion and stenosis of bilateral carotid arteries: Secondary | ICD-10-CM | POA: Insufficient documentation

## 2021-02-21 DIAGNOSIS — I7771 Dissection of carotid artery: Secondary | ICD-10-CM | POA: Diagnosis not present

## 2021-02-21 NOTE — Progress Notes (Signed)
History of Present Illness:  Patient is a 55 y.o. year old female who presents for evaluation of carotid stenosis.   She was found to have asymptomatic L ICA dissection and thrombus on an MRI for neck pain issues.  Patient has no history of TIA or stroke.  She denise amaurosis, weakness or aphasia.   She takes daily Statin and ASA.   Past Medical History:  Diagnosis Date  . Anemia   . Anxiety   . Arthritis   . Carpal tunnel syndrome   . Depression   . Essential hypertension, benign 03/07/2015  . GERD (gastroesophageal reflux disease)   . History of degenerative disc disease   . Hx of ectopic pregnancy   . Hyperlipidemia   . Lumbosacral spondylolysis   . Obese   . OCD (obsessive compulsive disorder)   . Thyroid disease     Past Surgical History:  Procedure Laterality Date  . BACK SURGERY     L5 to S1 discectomy  . ECTOPIC PREGNANCY SURGERY    . ENDOMETRIAL ABLATION    . ESOPHAGOGASTRODUODENOSCOPY     in 20's had pepic ulcers   . TUBAL LIGATION    . TUBAL LIGATION    . UPPER GASTROINTESTINAL ENDOSCOPY       Social History Social History   Tobacco Use  . Smoking status: Never Smoker  . Smokeless tobacco: Never Used  Vaping Use  . Vaping Use: Never used  Substance Use Topics  . Alcohol use: Yes    Alcohol/week: 12.0 - 13.0 standard drinks    Types: 6 - 7 Glasses of wine, 6 Standard drinks or equivalent per week  . Drug use: No    Family History Family History  Problem Relation Age of Onset  . Aneurysm Father   . AAA (abdominal aortic aneurysm) Father   . Depression Mother   . Cancer Mother        Pancreatic  . Thyroid disease Mother        hypothyroidism  . Varicose Veins Mother   . Colon polyps Mother   . Drug abuse Sister   . Hyperlipidemia Brother   . Diabetes Maternal Grandmother   . Alcohol abuse Maternal Uncle   . Esophageal cancer Neg Hx   . Colon cancer Neg Hx   . Rectal cancer Neg Hx   . Stomach cancer Neg Hx      Allergies  Allergies  Allergen Reactions  . Bee Venom Anaphylaxis  . Topiramate Other (See Comments)     Current Outpatient Medications  Medication Sig Dispense Refill  . aspirin 81 MG EC tablet TAKE 1 TABLET (81 MG TOTAL) BY MOUTH DAILY. 90 tablet 3  . FLUoxetine (PROZAC) 40 MG capsule Take 2 capsules (80 mg total) by mouth daily. 60 capsule 3  . levothyroxine (SYNTHROID) 137 MCG tablet Take 1 tablet (137 mcg total) by mouth daily before breakfast. 30 tablet 3  . liothyronine (CYTOMEL) 5 MCG tablet Take 1 tablet (5 mcg total) by mouth daily. 30 tablet 3  . omeprazole (PRILOSEC) 40 MG capsule Take 1 capsule (40 mg total) by mouth daily. 90 capsule 3  . rosuvastatin (CRESTOR) 10 MG tablet TAKE ONE TABLET BY MOUTH DAILY 90 tablet 0  . traMADol (ULTRAM-ER) 300 MG 24 hr tablet Take 1 tablet by mouth daily.     No current facility-administered medications for this visit.    ROS:   General:  No weight loss, Fever, chills  HEENT: No  recent headaches, no nasal bleeding, no visual changes, no sore throat  Neurologic: No dizziness, blackouts, seizures. No recent symptoms of stroke or mini- stroke. No recent episodes of slurred speech, or temporary blindness.  Cardiac: No recent episodes of chest pain/pressure, no shortness of breath at rest.  No shortness of breath with exertion.  Denies history of atrial fibrillation or irregular heartbeat  Vascular: No history of rest pain in feet.  No history of claudication.  No history of non-healing ulcer, No history of DVT   Pulmonary: No home oxygen, no productive cough, no hemoptysis,  No asthma or wheezing  Musculoskeletal:  [ ]  Arthritis, [ ]  Low back pain,  [ ]  Joint pain  Hematologic:No history of hypercoagulable state.  No history of easy bleeding.  No history of anemia  Gastrointestinal: No hematochezia or melena,  No gastroesophageal reflux, no trouble swallowing  Urinary: [ ]  chronic Kidney disease, [ ]  on HD - [ ]  MWF or [ ]   TTHS, [ ]  Burning with urination, [ ]  Frequent urination, [ ]  Difficulty urinating;   Skin: No rashes  Psychological: No history of anxiety,  No history of depression   Physical Examination  Vitals:   02/21/21 1028 02/21/21 1031  BP: 134/84 135/84  Pulse: 72   Resp: 20   Temp: 98 F (36.7 C)   TempSrc: Temporal   SpO2: 97%   Weight: 264 lb 8 oz (120 kg)   Height: 5\' 4"  (1.626 m)     Body mass index is 45.4 kg/m.  General:  Alert and oriented, no acute distress HEENT: Normal Neck: No bruit or JVD Pulmonary: Clear to auscultation bilaterally Cardiac: Regular Rate and Rhythm without murmur Gastrointestinal: Soft, non-tender, non-distended, no mass, no scars Skin: No rash Musculoskeletal: No deformity or edema  Neurologic: Upper and lower extremity motor 5/5 and symmetric  DATA:     Right Carotid Findings:  +----------+--------+--------+--------+------------------+-----------------  ---+       PSV cm/sEDV cm/sStenosisPlaque DescriptionComments         +----------+--------+--------+--------+------------------+-----------------  ---+  CCA Prox 104   35                             +----------+--------+--------+--------+------------------+-----------------  ---+  CCA Mid  113   36                             +----------+--------+--------+--------+------------------+-----------------  ---+  CCA Distal89   33                             +----------+--------+--------+--------+------------------+-----------------  ---+  ICA Prox 64   26   Normal                       +----------+--------+--------+--------+------------------+-----------------  ---+  ICA Mid  101   46                             +----------+--------+--------+--------+------------------+-----------------   ---+  ICA Distal139   68   40-59%          tortuous and  tapered  +----------+--------+--------+--------+------------------+-----------------  ---+  ECA    135   38                             +----------+--------+--------+--------+------------------+-----------------  ---+   +----------+--------+-------+----------------+-------------------+  PSV cm/sEDV cmsDescribe    Arm Pressure (mmHG)  +----------+--------+-------+----------------+-------------------+  [id number]       Multiphasic, WNL            +----------+--------+-------+----------------+-------------------+   +---------+--------+--+--------+--+---------+  VertebralPSV cm/s64EDV cm/s23Antegrade  +---------+--------+--+--------+--+---------+      Left Carotid Findings:  +----------+--------+--------+--------+------------------+-----------------  -+       PSV cm/sEDV cm/sStenosisPlaque DescriptionComments        +----------+--------+--------+--------+------------------+-----------------  -+  CCA Prox 113   40                            +----------+--------+--------+--------+------------------+-----------------  -+  CCA Mid  93   32                            +----------+--------+--------+--------+------------------+-----------------  -+  CCA Distal89   32                intimal  thickening  +----------+--------+--------+--------+------------------+-----------------  -+  ICA Prox 59   23   Normal          intimal  thickening  +----------+--------+--------+--------+------------------+-----------------  -+  ICA Mid  116   44   40-59%          tortuous        +----------+--------+--------+--------+------------------+-----------------  -+   ICA Distal95   40                            +----------+--------+--------+--------+------------------+-----------------  -+  ECA    101   29                            +----------+--------+--------+--------+------------------+-----------------  -+   +----------+--------+--------+----------------+-------------------+       PSV cm/sEDV cm/sDescribe    Arm Pressure (mmHG)  +----------+--------+--------+----------------+-------------------+  [id number]       Multiphasic, WNL            +----------+--------+--------+----------------+-------------------+   +---------+--------+--+--------+--+---------+  VertebralPSV cm/s48EDV cm/s20Antegrade  +---------+--------+--+--------+--+---------+      Summary:  Right Carotid: There is no evidence of stenosis in the right ICA.         The mid to distal ICA becomes tapered with elevated  velocities;         however, no appreciable disease observed.   Left Carotid: There is no evidence of stenosis in the left ICA.        Elevated velocities present in the mid segment, most likely  due to        tortuosity.   Vertebrals: Bilateral vertebral arteries demonstrate antegrade flow.  Subclavians: Normal flow hemodynamics were seen in bilateral subclavian        arteries.   ASSESSMENT:  Asymptomatic carotid stenosis She is stable without change is stenosis B < 59%  PLAN: She has started an exercise program and is working on a healthy diet.  She will cont. ASA and statin daily.  F/U in 1 year for repeat duplex.  We reviewed symptoms/signs of stroke/TIA.  If these occur she will call 911.   Roxy Horseman PA-C [facility] [place] Office: [phone]  MD in clinic [place]

## 2021-04-03 LAB — TSH: TSH: 0.13 mIU/L — ABNORMAL LOW

## 2021-04-09 ENCOUNTER — Other Ambulatory Visit: Payer: Self-pay | Admitting: Sports Medicine

## 2021-04-09 DIAGNOSIS — E039 Hypothyroidism, unspecified: Secondary | ICD-10-CM

## 2021-04-09 NOTE — Assessment & Plan Note (Signed)
TSH was low, it was too high at 137 mcg so we added liothyronine, now that it is too low I think we should go to a half tab of liothyronine and then recheck in 6 weeks.

## 2021-04-11 ENCOUNTER — Other Ambulatory Visit: Payer: Self-pay

## 2021-04-11 ENCOUNTER — Ambulatory Visit (INDEPENDENT_AMBULATORY_CARE_PROVIDER_SITE_OTHER): Payer: Medicare Other | Admitting: Sports Medicine

## 2021-04-11 DIAGNOSIS — E039 Hypothyroidism, unspecified: Secondary | ICD-10-CM | POA: Diagnosis not present

## 2021-04-11 DIAGNOSIS — E669 Obesity, unspecified: Secondary | ICD-10-CM

## 2021-04-11 DIAGNOSIS — I6523 Occlusion and stenosis of bilateral carotid arteries: Secondary | ICD-10-CM

## 2021-04-11 NOTE — Assessment & Plan Note (Signed)
Obesity, has not responded to multiple modalities, she does tend to drink over 1200 cal of alcohol daily. She would like some help, I would like her to touch base with bariatric surgery

## 2021-04-11 NOTE — Progress Notes (Signed)
    Procedures performed today:    None.  Independent interpretation of notes and tests performed by another provider:   None.  Brief History, Exam, Impression, and Recommendations:    Obesity Obesity, has not responded to multiple modalities, she does tend to drink over 1200 cal of alcohol daily. She would like some help, I would like her to touch base with bariatric surgery  Hypothyroidism TSH was low recently, it was too high to 137 mcg of levothyroxine so we added liothyronine, dropped too low, since the discontinuation of liothyronine she has noted great improvements in multiple symptoms. We will recheck TSH in 4 weeks. If TSH is high again we may consider the addition of a half tab of liothyronine.    ___________________________________________ [name]. [name], M.D., ABFM., CAQSM. Primary Care and [place] Instructor of [place] of [facility] of Medicine

## 2021-04-11 NOTE — Assessment & Plan Note (Signed)
TSH was low recently, it was too high to 137 mcg of levothyroxine so we added liothyronine, dropped too low, since the discontinuation of liothyronine she has noted great improvements in multiple symptoms. We will recheck TSH in 4 weeks. If TSH is high again we may consider the addition of a half tab of liothyronine.

## 2021-04-16 ENCOUNTER — Telehealth (INDEPENDENT_AMBULATORY_CARE_PROVIDER_SITE_OTHER): Payer: Medicare Other | Admitting: Sports Medicine

## 2021-04-16 DIAGNOSIS — I6523 Occlusion and stenosis of bilateral carotid arteries: Secondary | ICD-10-CM | POA: Diagnosis not present

## 2021-04-16 DIAGNOSIS — U071 COVID-19: Secondary | ICD-10-CM

## 2021-04-16 DIAGNOSIS — B349 Viral infection, unspecified: Secondary | ICD-10-CM

## 2021-04-16 MED ORDER — PAXLOVID 20 X 150 MG & 10 X 100MG PO TBPK: 3.0000 | ORAL_TABLET | Freq: Two times a day (BID) | ORAL | 0 refills | Status: AC

## 2021-04-16 NOTE — Progress Notes (Addendum)
   Virtual Visit via WebEx/MyChart   I connected with  Kelsey Snow  on 04/16/21 via WebEx/MyChart/Doximity Video and verified that I am speaking with the correct person using two identifiers.   I discussed the limitations, risks, security and privacy concerns of performing an evaluation and management service by WebEx/MyChart/Doximity Video, including the higher likelihood of inaccurate diagnosis and treatment, and the availability of in person appointments.  We also discussed the likely need of an additional face to face encounter for complete and high quality delivery of care.  I also discussed with the patient that there may be a patient responsible charge related to this service. The patient expressed understanding and wishes to proceed.  Provider location is in medical facility. Patient location is at their home, different from provider location. People involved in care of the patient during this telehealth encounter were myself, my nurse/medical assistant, and my front office/scheduling team member.  Review of Systems: No fevers, chills, night sweats, weight loss, chest pain, or shortness of breath.   Objective Findings:    General: Speaking full sentences, no audible heavy breathing.  Sounds alert and appropriately interactive.  Appears well.  Face symmetric.  Extraocular movements intact.  Pupils equal and round.  No nasal flaring or accessory muscle use visualized.  Independent interpretation of tests performed by another provider:   None.  Brief History, Exam, Impression, and Recommendations:    [id number] Kelsey Snow is a very pleasant 55 year old female she has started to have viral symptoms in the form of cough, runny nose, mild sore throat, myalgias, fevers and chills. No GI symptoms, no skin rash, no shortness of breath or nasal flaring. On the video visit she is speaking full sentences, no accessory muscle use, she certainly has a virus I explained that initially treatment is  symptomatic relief, she will use DayQuil and NyQuil, I would like her to get a COVID test, if positive we can certainly use Paxlovid.   She will send [place] a MyChart message to let us know if she is positive or negative.  I did educate her on techniques to get a good sample.  Update: Patient called back, her home COVID test is positive, adding Paxlovid, she will isolate for 5 days followed by 5 additional days of mask wear.   I discussed the above assessment and treatment plan with the patient. The patient was provided an opportunity to ask questions and all were answered. The patient agreed with the plan and demonstrated an understanding of the instructions.   The patient was advised to call back or seek an in-person evaluation if the symptoms worsen or if the condition fails to improve as anticipated.   I provided 30 minutes of face to face and non-face-to-face time during this encounter date, time was needed to gather information, review chart, records, communicate/coordinate with staff remotely, as well as complete documentation.  Specifically we discussed the differential diagnosis and techniques for good sampling.   ___________________________________________ [name]. [name], M.D., ABFM., CAQSM. Primary Care and [place] Instructor of [place] of [facility] of Medicine

## 2021-04-16 NOTE — Assessment & Plan Note (Addendum)
Kelsey Snow is a very pleasant 55 year old female she has started to have viral symptoms in the form of cough, runny nose, mild sore throat, myalgias, fevers and chills. No GI symptoms, no skin rash, no shortness of breath or nasal flaring. On the video visit she is speaking full sentences, no accessory muscle use, she certainly has a virus I explained that initially treatment is symptomatic relief, she will use DayQuil and NyQuil, I would like her to get a COVID test, if positive we can certainly use Paxlovid.   She will send [place] a MyChart message to let us know if she is positive or negative.  I did educate her on techniques to get a good sample.  Update: Patient called back, her home COVID test is positive, adding Paxlovid, she will isolate for 5 days followed by 5 additional days of mask wear.

## 2021-04-16 NOTE — Addendum Note (Signed)
Addended by: Silverio Decamp on: 04/16/2021 10:32 AM   Modules accepted: Orders

## 2021-04-25 ENCOUNTER — Ambulatory Visit: Payer: Medicare Other | Admitting: Sports Medicine

## 2021-05-07 ENCOUNTER — Ambulatory Visit: Payer: Medicare Other | Admitting: Sports Medicine

## 2021-05-09 ENCOUNTER — Other Ambulatory Visit: Payer: Medicare Other

## 2021-05-15 LAB — T4, FREE: Free T4: 1.1 ng/dL (ref 0.8–1.8)

## 2021-05-15 LAB — T3, FREE: T3, Free: 3 pg/mL (ref 2.3–4.2)

## 2021-05-15 LAB — TSH: TSH: 1.08 mIU/L

## 2021-05-19 ENCOUNTER — Ambulatory Visit (INDEPENDENT_AMBULATORY_CARE_PROVIDER_SITE_OTHER): Payer: Medicare Other | Admitting: Sports Medicine

## 2021-05-19 ENCOUNTER — Other Ambulatory Visit: Payer: Self-pay

## 2021-05-19 ENCOUNTER — Ambulatory Visit (INDEPENDENT_AMBULATORY_CARE_PROVIDER_SITE_OTHER): Payer: Medicare Other

## 2021-05-19 DIAGNOSIS — G5603 Carpal tunnel syndrome, bilateral upper limbs: Secondary | ICD-10-CM

## 2021-05-19 DIAGNOSIS — I6523 Occlusion and stenosis of bilateral carotid arteries: Secondary | ICD-10-CM | POA: Diagnosis not present

## 2021-05-19 NOTE — Progress Notes (Signed)
    Procedures performed today:    Procedure: Real-time Ultrasound Guided hydrodissection of the left median nerve at the carpal tunnel Device: Samsung HS60 Verbal informed consent obtained.  Time-out conducted.  Noted no overlying erythema, induration, or other signs of local infection.  Skin prepped in a sterile fashion.  Local anesthesia: Topical Ethyl chloride.  With sterile technique and under real time ultrasound guidance: Noted slightly large median nerve, using a 25-gauge needle advanced into the carpal tunnel, taking care to avoid intraneural injection I injected medication both superficial to and deep to the median nerve freeing it from surrounding structures, I then redirected the needle deep and injected further medication around the flexor tendons deep within the carpal tunnel for a total of 1 cc kenalog 40, 5 cc 1% lidocaine without epinephrine. Completed without difficulty  Advised to call if fevers/chills, erythema, induration, drainage, or persistent bleeding.  Images permanently stored and available for review in PACS.  Impression: Technically successful ultrasound guided median nerve hydrodissection.  Independent interpretation of notes and tests performed by another provider:   None.  Brief History, Exam, Impression, and Recommendations:    Carpal tunnel syndrome, left, status post right carpal tunnel release Coralyn Mark returns, she is post right carpal tunnel release surgery with Dr. Amedeo Plenty, recurrence of pain in the left, last injection was over 6 months ago. Repeat left median nerve hydrodissection, I have asked her to go ahead and discuss carpal tunnel release with Dr. Amedeo Plenty.  This is a chronic process with exacerbation and pharmacologic intervention.  ___________________________________________ [name]. [name], M.D., ABFM., CAQSM. Primary Care and [place] Instructor of [place] of  [facility] of Medicine

## 2021-05-19 NOTE — Assessment & Plan Note (Signed)
Kelsey Snow returns, she is post right carpal tunnel release surgery with Dr. Amedeo Plenty, recurrence of pain in the left, last injection was over 6 months ago. Repeat left median nerve hydrodissection, I have asked her to go ahead and discuss carpal tunnel release with Dr. Amedeo Plenty.

## 2021-05-22 ENCOUNTER — Other Ambulatory Visit: Payer: Self-pay

## 2021-05-22 ENCOUNTER — Emergency Department (INDEPENDENT_AMBULATORY_CARE_PROVIDER_SITE_OTHER)
Admission: EM | Admit: 2021-05-22 | Discharge: 2021-05-22 | Disposition: A | Discharge status: Home / Self Care | Payer: Medicare Other | Source: Home / Self Care

## 2021-05-22 DIAGNOSIS — T63441A Toxic effect of venom of bees, accidental (unintentional), initial encounter: Secondary | ICD-10-CM | POA: Diagnosis not present

## 2021-05-22 MED ORDER — ACETAMINOPHEN 325 MG PO TABS
650.0000 mg | ORAL_TABLET | Freq: Once | ORAL | Status: AC
  Administered 2021-05-22: 650 mg via ORAL

## 2021-05-22 MED ORDER — METHYLPREDNISOLONE 4 MG PO TBPK: ORAL_TABLET | ORAL | 0 refills | Status: DC

## 2021-05-22 MED ORDER — FEXOFENADINE HCL 180 MG PO TABS: 180.0000 mg | ORAL_TABLET | Freq: Every day | ORAL | 0 refills | Status: DC

## 2021-05-22 MED ORDER — EPINEPHRINE 0.3 MG/0.3ML IJ SOAJ
0.3000 mg | Freq: Once | INTRAMUSCULAR | Status: AC
  Administered 2021-05-22: 0.3 mg via INTRAMUSCULAR

## 2021-05-22 NOTE — ED Notes (Signed)
Pt a/o, sitting in procedure room chair w/ husband present. Still c/o pain to sting sites.

## 2021-05-22 NOTE — ED Notes (Signed)
Epi pen given to L thigh per order of M. Enis Gash, [place]. Pt reports taking 2 Benadryl and 1 prednisone tablet (unknown dose) at home right after getting stung by yellow jackets.

## 2021-05-22 NOTE — Discharge Instructions (Addendum)
Advised patient to start Medrol Dosepak tomorrow morning, Friday, 05/23/2021.  Advised patient may start Allegra 180 mg without D daily for the next 5 to 7 days rather than Benadryl to stabilize mast cells over yellowjacket sting sites.

## 2021-05-22 NOTE — ED Notes (Signed)
Pt a/o, resp regular and unlabored. No oral swelling. D/c'd home w/ husband per order of M. Enis Gash, [place].

## 2021-05-22 NOTE — ED Notes (Signed)
Diaphoresis and tachypnea continue. Pt is a/o and c/o severe pain to sting sites. BP 178/139, HR 85, 100% O2 sat.

## 2021-05-22 NOTE — ED Triage Notes (Signed)
1043 Pt arrives to UC, diaphoretic and w/ tachypnea, yelling that she has been stung by approx 20 yellow jackets. She is dizzy as well. Assisted to procedure room.

## 2021-05-22 NOTE — ED Provider Notes (Signed)
[facility]    CSN: [id number] Arrival date & time: 05/22/21  1102      History   Chief Complaint Chief Complaint  Patient presents with   Insect Bite    HPI Kelsey Snow is a 55 y.o. female.   HPI 55 year old female presents with multiple yellowjacket stings (more than 20 per patient), patient diaphoretic w/ tachypnea, yelling shouting " I have been stung by 20 yellow jacket's."  Additionally, patient reports lightheaded dizziness as being assisted to our procedure room today.  Patient is accompanied by her husband at this office visit.  Past Medical History:  Diagnosis Date   Anemia    Anxiety    Arthritis    Carpal tunnel syndrome    Depression    Essential hypertension, benign 03/07/2015   GERD (gastroesophageal reflux disease)    History of degenerative disc disease    Hx of ectopic pregnancy    Hyperlipidemia    Lumbosacral spondylolysis    Obese    OCD (obsessive compulsive disorder)    Thyroid disease     Patient Active Problem List   Diagnosis Date Noted   COVID-19 04/16/2021   Tinnitus 11/13/2020   Lesion of dorsum of nose 08/12/2020   Right foot injury 07/24/2020   Hemangioma 05/27/2020   Insomnia 05/09/2020   History of endometrial ablation 01/10/2020   Epigastric pain 12/07/2019   Sacroiliac joint pain 07/28/2018   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 12/16/2017   Lumbar radiculopathy 11/29/2017   Strain of peroneal tendon, left, initial encounter 05/18/2017   Chronic pain syndrome 11/26/2016   Facet arthropathy, cervical 06/05/2016   Postlaminectomy syndrome of lumbar region 05/01/2015   Carpal tunnel syndrome, left, status post right carpal tunnel release 11/29/2014   Foraminal stenosis of cervical region 05/03/2014   Low back pain 05/03/2014   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   Cervical spondylosis without myelopathy 04/13/2014   Perennial allergic rhinitis 11/16/2013   Obsessive  compulsive disorder with depression and anxiety 10/10/2013   Hypothyroidism 10/10/2013   Obesity 10/10/2013   Annual physical exam 10/10/2013   Lumbar degenerative disc disease 10/10/2013   Hypercholesterolemia 05/12/2013   Esophageal reflux 03/25/2013    Past Surgical History:  Procedure Laterality Date   BACK SURGERY     L5 to S1 discectomy   ECTOPIC PREGNANCY SURGERY     ENDOMETRIAL ABLATION     ESOPHAGOGASTRODUODENOSCOPY     in 20's had pepic ulcers    TUBAL LIGATION     TUBAL LIGATION     UPPER GASTROINTESTINAL ENDOSCOPY      OB History   No obstetric history on file.      Home Medications    Prior to Admission medications   Medication Sig Start Date End Date Taking? Authorizing Provider  fexofenadine (ALLEGRA ALLERGY) 180 MG tablet Take 1 tablet (180 mg total) by mouth daily for 15 days. 05/22/21 06/06/21 Yes Eliezer Lofts, FNP  methylPREDNISolone (MEDROL DOSEPAK) 4 MG TBPK tablet Take as directed 05/22/21  Yes Eliezer Lofts, FNP  aspirin 81 MG EC tablet TAKE 1 TABLET (81 MG TOTAL) BY MOUTH DAILY. 08/22/15   Silverio Decamp, MD  FLUoxetine (PROZAC) 40 MG capsule Take 2 capsules (80 mg total) by mouth daily. 02/06/21   Silverio Decamp, MD  levothyroxine (SYNTHROID) 137 MCG tablet Take 1 tablet (137 mcg total) by mouth daily before breakfast. 02/10/21   Silverio Decamp, MD  omeprazole (Dixon)  40 MG capsule Take 1 capsule (40 mg total) by mouth daily. 02/05/21   Silverio Decamp, MD  rosuvastatin (CRESTOR) 10 MG tablet TAKE ONE TABLET BY MOUTH DAILY 04/09/21   Silverio Decamp, MD    Family History Family History  Problem Relation Age of Onset   Aneurysm Father    AAA (abdominal aortic aneurysm) Father    Depression Mother    Cancer Mother        Pancreatic   Thyroid disease Mother        hypothyroidism   Varicose Veins Mother    Colon polyps Mother    Drug abuse Sister    Hyperlipidemia Brother    Diabetes Maternal Grandmother     Alcohol abuse Maternal Uncle    Esophageal cancer Neg Hx    Colon cancer Neg Hx    Rectal cancer Neg Hx    Stomach cancer Neg Hx     Social History Social History   Tobacco Use   Smoking status: Never   Smokeless tobacco: Never  Vaping Use   Vaping Use: Never used  Substance Use Topics   Alcohol use: Yes    Alcohol/week: 10.0 - 12.0 standard drinks    Types: 10 - 12 Standard drinks or equivalent per week    Comment: weekly   Drug use: No     Allergies   Bee venom and Topiramate   Review of Systems Review of Systems  Constitutional:  Positive for diaphoresis.  Respiratory:  Positive for shortness of breath.   Skin:        Multiple yellowjacket stings of left lower leg, left foot, and right torso area x 30 minutes ago  All other systems reviewed and are negative.   Physical Exam Triage Vital Signs ED Triage Vitals  Enc Vitals Group     BP 05/22/21 1048 (!) 180/111     Pulse Rate 05/22/21 1048 88     Resp 05/22/21 1048 (!) 32     Temp 05/22/21 1118 97.8 F (36.6 C)     Temp Source 05/22/21 1118 Oral     SpO2 05/22/21 1048 100 %     Weight 05/22/21 1111 254 lb (115.2 kg)     Height 05/22/21 1111 '5\' 5"'$  (1.651 m)     Head Circumference --      Peak Flow --      Pain Score 05/22/21 1111 10     Pain Loc --      Pain Edu? --      Excl. in [place]? --    No data found.  Updated Vital Signs BP (!) 158/97   Pulse 64   Temp 97.8 F (36.6 C) (Oral)   Resp 20   Ht '5\' 5"'$  (1.651 m)   Wt 254 lb (115.2 kg)   LMP 02/26/2014   SpO2 96%   BMI 42.27 kg/m    Physical Exam Vitals and nursing note reviewed.  Constitutional:      General: She is not in acute distress.    Appearance: Normal appearance. She is obese. She is not ill-appearing.  HENT:     Head: Normocephalic and atraumatic.     Mouth/Throat:     Mouth: Mucous membranes are moist.     Pharynx: Oropharynx is clear. No posterior oropharyngeal erythema.  Eyes:     Extraocular Movements: Extraocular  movements intact.     Conjunctiva/sclera: Conjunctivae normal.     Pupils: Pupils are equal, round, and reactive to light.  Cardiovascular:     Rate and Rhythm: Normal rate and regular rhythm.     Pulses: Normal pulses.     Heart sounds: Normal heart sounds.  Pulmonary:     Effort: Pulmonary effort is normal. No respiratory distress.     Breath sounds: Normal breath sounds. No stridor. No wheezing, rhonchi or rales.  Chest:     Chest wall: No tenderness.  Musculoskeletal:        General: Normal range of motion.     Cervical back: Normal range of motion and neck supple. No tenderness.  Lymphadenopathy:     Cervical: No cervical adenopathy.  Skin:    General: Skin is warm and dry.     Comments: Left upper/lower leg, left foot, right torso area: Multiple erythematous plaques from multiple yellowjacket stings  Neurological:     General: No focal deficit present.     Mental Status: She is alert and oriented to person, place, and time. Mental status is at baseline.  Psychiatric:        Mood and Affect: Mood normal.        Behavior: Behavior normal.        Thought Content: Thought content normal.     UC Treatments / Results  Labs (all labs ordered are listed, but only abnormal results are displayed) Labs Reviewed - No data to display  EKG   Radiology No results found.  Procedures Procedures (including critical care time)  Medications Ordered in UC Medications  EPINEPHrine (EPI-PEN) injection 0.3 mg (0.3 mg Intramuscular Given 05/22/21 1045)  acetaminophen (TYLENOL) tablet 650 mg (650 mg Oral Given 05/22/21 1105)    Initial Impression / Assessment and Plan / UC Course  I have reviewed the triage vital signs and the nursing notes.  Pertinent labs & imaging results that were available during my care of the patient were reviewed by me and considered in my medical decision making (see chart for details).     MDM: 1.  Bee sting reaction, accidental, initial encounter-epi  autoinjector 0.3 mg Sunflower/IM given once at initial evaluation, Rx'd Medrol Dosepak (which patient will start tomorrow morning, Friday, 05/23/2021), Rx'd Allegra. Advised patient may start Allegra 180 mg without D daily for the next 5 to 7 days rather than Benadryl to stabilize mast cells over yellowjacket sting sites of dermis.  Patient discharged home after 1:45 here in clinic for observation, hemodynamically stable. Final Clinical Impressions(s) / UC Diagnoses   Final diagnoses:  Bee sting reaction, accidental or unintentional, initial encounter     Discharge Instructions      Advised patient to start Medrol Dosepak tomorrow morning, Friday, 05/23/2021.  Advised patient may start Allegra 180 mg without D daily for the next 5 to 7 days rather than Benadryl to stabilize mast cells over yellowjacket sting sites.       ED Prescriptions     Medication Sig Dispense Auth. Provider   methylPREDNISolone (MEDROL DOSEPAK) 4 MG TBPK tablet Take as directed 1 each Eliezer Lofts, FNP   fexofenadine [facility] ALLERGY) 180 MG tablet Take 1 tablet (180 mg total) by mouth daily for 15 days. 15 tablet Eliezer Lofts, FNP      PDMP not reviewed this encounter.   Eliezer Lofts, [place] 05/22/21 1324

## 2021-05-26 MED ORDER — EPINEPHRINE 0.3 MG/0.3ML IJ SOAJ: 0.3000 mg | INTRAMUSCULAR | 0 refills | Status: DC | PRN

## 2021-05-28 ENCOUNTER — Ambulatory Visit: Payer: Medicare Other

## 2021-06-08 ENCOUNTER — Other Ambulatory Visit: Payer: Self-pay | Admitting: Sports Medicine

## 2021-06-08 DIAGNOSIS — F422 Mixed obsessional thoughts and acts: Secondary | ICD-10-CM

## 2021-07-07 ENCOUNTER — Other Ambulatory Visit: Payer: Self-pay

## 2021-07-07 ENCOUNTER — Ambulatory Visit (INDEPENDENT_AMBULATORY_CARE_PROVIDER_SITE_OTHER): Payer: Medicare Other

## 2021-07-07 VITALS — BP 164/91 | HR 65 | Temp 98.3°F

## 2021-07-07 DIAGNOSIS — Z23 Encounter for immunization: Secondary | ICD-10-CM

## 2021-07-07 NOTE — Patient Instructions (Signed)
Influenza (Flu) Vaccine (Inactivated or Recombinant): What You Need to Know 1. Why get vaccinated? Influenza vaccine can prevent influenza (flu). Flu is a contagious disease that spreads around the [place] every year, usually between October and May. Anyone can get the flu, but it is more dangerous for some people. Infants and young children, people 65 years and older, pregnant people, and people with certain health conditions or a weakened immune system are at greatest risk of flu complications. Pneumonia, bronchitis, sinus infections, and ear infections are examples of flu-related complications. If you have a medical condition, such as heart disease, cancer, or diabetes, flu can make it worse. Flu can cause fever and chills, sore throat, muscle aches, fatigue, cough, headache, and runny or stuffy nose. Some people may have vomiting and diarrhea, though this is more common in children than adults. In an average year, thousands of people in the [place] die from flu, and many more are hospitalized. Flu vaccine prevents millions of illnesses and flu-related visits to the doctor each year. 2. Influenza vaccines CDC recommends everyone 6 months and older get vaccinated every flu season. Children 6 months through 8 years of age may need 2 doses during a single flu season. Everyone else needs only 1 dose each flu season. It takes about 2 weeks for protection to develop after vaccination. There are many flu viruses, and they are always changing. Each year a new flu vaccine is made to protect against the influenza viruses believed to be likely to cause disease in the upcoming flu season. Even when the vaccine doesn't exactly match these viruses, it may still provide some protection. Influenza vaccine does not cause flu. Influenza vaccine may be given at the same time as other vaccines. 3. Talk with your health care provider Tell your vaccination provider if the person getting the vaccine: Has had  an allergic reaction after a previous dose of influenza vaccine, or has any severe, life-threatening allergies Has ever had Guillain-Barr Syndrome (also called "GBS") In some cases, your health care provider may decide to postpone influenza vaccination until a future visit. Influenza vaccine can be administered at any time during pregnancy. People who are or will be pregnant during influenza season should receive inactivated influenza vaccine. People with minor illnesses, such as a cold, may be vaccinated. People who are moderately or severely ill should usually wait until they recover before getting influenza vaccine. Your health care provider can give you more information. 4. Risks of a vaccine reaction Soreness, redness, and swelling where the shot is given, fever, muscle aches, and headache can happen after influenza vaccination. There may be a very small increased risk of Guillain-Barr Syndrome (GBS) after inactivated influenza vaccine (the flu shot). Young children who get the flu shot along with pneumococcal vaccine (PCV13) and/or DTaP vaccine at the same time might be slightly more likely to have a seizure caused by fever. Tell your health care provider if a child who is getting flu vaccine has ever had a seizure. People sometimes faint after medical procedures, including vaccination. Tell your provider if you feel dizzy or have vision changes or ringing in the ears. As with any medicine, there is a very remote chance of a vaccine causing a severe allergic reaction, other serious injury, or death. 5. What if there is a serious problem? An allergic reaction could occur after the vaccinated person leaves the clinic. If you see signs of a severe allergic reaction (hives, swelling of the face and throat, difficulty breathing,   a fast heartbeat, dizziness, or weakness), call 9-1-1 and get the person to the nearest hospital. For other signs that concern you, call your health care provider. Adverse  reactions should be reported to the Vaccine Adverse Event Reporting System (VAERS). Your health care provider will usually file this report, or you can do it yourself. Visit the VAERS website at www.vaers.hhs.gov or call 1-800-822-7967. VAERS is only for reporting reactions, and VAERS staff members do not give medical advice. 6. The National Vaccine Injury Compensation Program The National Vaccine Injury Compensation Program (VICP) is a federal program that was created to compensate people who may have been injured by certain vaccines. Claims regarding alleged injury or death due to vaccination have a time limit for filing, which may be as short as two years. Visit the VICP website at www.hrsa.gov/vaccinecompensation or call 1-800-338-2382 to learn about the program and about filing a claim. 7. How can I learn more? Ask your health care provider. Call your local or state health department. Visit the website of the Food and Drug Administration (FDA) for vaccine package inserts and additional information at www.fda.gov/vaccines-blood-biologics/vaccines. Contact the Centers for Disease Control and Prevention (CDC): Call 1-800-232-4636 (1-800-CDC-INFO) or Visit CDC's website at www.cdc.gov/flu. Vaccine Information Statement Inactivated Influenza Vaccine (05/24/2020) This information is not intended to replace advice given to you by your health care provider. Make sure you discuss any questions you have with your health care provider. Document Revised: 07/11/2020 Document Reviewed: 07/11/2020 Elsevier Patient Education  2022 Elsevier Inc.  

## 2021-07-07 NOTE — Progress Notes (Signed)
Patient presents today for influenza vaccination.    HA: Yes, due to allergies Dizziness/lightheadedness: No BA: No Weakness/Fatigue: No  Sinus pain/pressure: Yes, due to allergies Runny nose: No  ST: No  ShOB: No  CP: No  Palps: No Abd pain: No Dysuria: No  N/V/C/D: No   Latex allergy?: No Allergic to chicken/eggs:: No Ever had a severe reaction to the flu shot before?: No Had a fever within the last 24 hours?: No History of Guillain-Barre syndrome?: No    Immunization given IM in LD. Pt tolerated immunization well without complications.   Patient advised to take Tylenol as needed if mild symptoms, such as body aches, should occur. Advised patient that if worse symptoms should arise, to give our office a call. Advised patient if ShOB or any symptom indicating a possible anaphylactic reaction should occur, to go immediately to the nearest emergency department. Patient verbalized understanding.    Patients BP was elevated on intake. First reading was 161/94 and a second reading 5 minutes later was 164/91. Pt states she has a headache, is a pain management patient and is experiencing pain today, she had multiple stressful moments this morning, and just had a caffeinated drink. I reported this to Dr. Darene Lamer and he said it was fine to give the flu shot and to instruct her to check her BP at home and send a MyChart message with her readings in a week. Pt states her readings at other offices has been in the 130's/80-90's. Pt advised of Dr. Mcneil Sober recommendation and verbalized understanding.

## 2021-07-08 ENCOUNTER — Other Ambulatory Visit: Payer: Self-pay | Admitting: Sports Medicine

## 2021-07-31 ENCOUNTER — Other Ambulatory Visit: Payer: Self-pay | Admitting: Sports Medicine

## 2021-07-31 DIAGNOSIS — Z1231 Encounter for screening mammogram for malignant neoplasm of breast: Secondary | ICD-10-CM

## 2021-08-06 ENCOUNTER — Other Ambulatory Visit: Payer: Self-pay

## 2021-08-06 ENCOUNTER — Ambulatory Visit (INDEPENDENT_AMBULATORY_CARE_PROVIDER_SITE_OTHER): Payer: Medicare Other

## 2021-08-06 DIAGNOSIS — Z1231 Encounter for screening mammogram for malignant neoplasm of breast: Secondary | ICD-10-CM | POA: Diagnosis not present

## 2021-08-31 ENCOUNTER — Other Ambulatory Visit: Payer: Self-pay | Admitting: Sports Medicine

## 2021-08-31 DIAGNOSIS — K219 Gastro-esophageal reflux disease without esophagitis: Secondary | ICD-10-CM

## 2021-10-16 ENCOUNTER — Other Ambulatory Visit: Payer: Self-pay | Admitting: Sports Medicine

## 2021-10-16 DIAGNOSIS — F422 Mixed obsessional thoughts and acts: Secondary | ICD-10-CM

## 2021-11-03 ENCOUNTER — Encounter: Payer: Self-pay | Admitting: Sports Medicine

## 2021-11-04 DIAGNOSIS — R197 Diarrhea, unspecified: Secondary | ICD-10-CM | POA: Diagnosis not present

## 2021-11-04 DIAGNOSIS — K219 Gastro-esophageal reflux disease without esophagitis: Secondary | ICD-10-CM | POA: Diagnosis not present

## 2021-11-04 DIAGNOSIS — R1010 Upper abdominal pain, unspecified: Secondary | ICD-10-CM | POA: Diagnosis not present

## 2021-11-07 DIAGNOSIS — R1010 Upper abdominal pain, unspecified: Secondary | ICD-10-CM | POA: Diagnosis not present

## 2021-11-11 DIAGNOSIS — M961 Postlaminectomy syndrome, not elsewhere classified: Secondary | ICD-10-CM | POA: Diagnosis not present

## 2021-11-11 DIAGNOSIS — M7918 Myalgia, other site: Secondary | ICD-10-CM | POA: Diagnosis not present

## 2021-11-11 DIAGNOSIS — M4726 Other spondylosis with radiculopathy, lumbar region: Secondary | ICD-10-CM | POA: Diagnosis not present

## 2021-11-11 DIAGNOSIS — G894 Chronic pain syndrome: Secondary | ICD-10-CM | POA: Diagnosis not present

## 2021-11-12 ENCOUNTER — Other Ambulatory Visit: Payer: Self-pay

## 2021-11-12 ENCOUNTER — Ambulatory Visit (INDEPENDENT_AMBULATORY_CARE_PROVIDER_SITE_OTHER): Payer: Medicare (Managed Care)

## 2021-11-12 ENCOUNTER — Ambulatory Visit (INDEPENDENT_AMBULATORY_CARE_PROVIDER_SITE_OTHER): Payer: Medicare (Managed Care) | Admitting: Sports Medicine

## 2021-11-12 DIAGNOSIS — G5603 Carpal tunnel syndrome, bilateral upper limbs: Secondary | ICD-10-CM

## 2021-11-12 NOTE — Assessment & Plan Note (Addendum)
To returns, she is post right carpal tunnel release with Dr. Amedeo Plenty, recurrence of carpal tunnel symptoms now on the left, last hydrodissection was in August of last year, repeat left median nerve Hydro dissection today, again we discussed my advice to return to [facility] for discussion of carpal tunnel release on the left. Of note we are seeing some fatty atrophy and have agreed to do a single additional carpal tunnel injection, but considering the fatty atrophy I think after that she should have the carpal tunnel release surgery.

## 2021-11-12 NOTE — Progress Notes (Signed)
° ° °  Procedures performed today:    Procedure: Real-time Ultrasound Guided hydrodissection of the left median nerve at the carpal tunnel Device: Samsung HS60 Verbal informed consent obtained.  Time-out conducted.  Noted no overlying erythema, induration, or other signs of local infection.  Skin prepped in a sterile fashion.  Local anesthesia: Topical Ethyl chloride.  With sterile technique and under real time ultrasound guidance: Noted enlarged median nerve.  Using a 25-gauge needle advanced into the carpal tunnel, taking care to avoid intraneural injection I injected medication both superficial to and deep to the median nerve freeing it from surrounding structures, I then redirected the needle deep and injected further medication around the flexor tendons deep within the carpal tunnel for a total of 1 cc kenalog 40, 5 cc 1% lidocaine without epinephrine. Completed without difficulty  Advised to call if fevers/chills, erythema, induration, drainage, or persistent bleeding.  Images permanently stored and available for review in PACS.  Impression: Technically successful ultrasound guided median nerve hydrodissection.  Independent interpretation of notes and tests performed by another provider:   None.  Brief History, Exam, Impression, and Recommendations:    Carpal tunnel syndrome, left, status post right carpal tunnel release To returns, she is post right carpal tunnel release with Dr. Amedeo Plenty, recurrence of carpal tunnel symptoms now on the left, last hydrodissection was in August of last year, repeat left median nerve Hydro dissection today, again we discussed my advice to return to [facility] for discussion of carpal tunnel release on the left. Of note we are seeing some fatty atrophy and have agreed to do a single additional carpal tunnel injection, but considering the fatty atrophy I think after that she should have the carpal tunnel release  surgery.    ___________________________________________ [name]. [name], M.D., ABFM., CAQSM. Primary Care and [place] Instructor of [place] [employer] [facility]

## 2021-11-12 NOTE — Addendum Note (Signed)
Addended by: Gust Brooms on: 11/12/2021 10:00 AM   Modules accepted: Orders

## 2021-11-29 ENCOUNTER — Other Ambulatory Visit: Payer: Self-pay | Admitting: Sports Medicine

## 2021-11-29 DIAGNOSIS — E039 Hypothyroidism, unspecified: Secondary | ICD-10-CM

## 2021-12-16 DIAGNOSIS — M5416 Radiculopathy, lumbar region: Secondary | ICD-10-CM | POA: Diagnosis not present

## 2021-12-17 ENCOUNTER — Encounter: Payer: Self-pay | Admitting: Sports Medicine

## 2021-12-17 DIAGNOSIS — E7800 Pure hypercholesterolemia, unspecified: Secondary | ICD-10-CM

## 2021-12-17 DIAGNOSIS — E561 Deficiency of vitamin K: Secondary | ICD-10-CM

## 2021-12-23 DIAGNOSIS — R7309 Other abnormal glucose: Secondary | ICD-10-CM | POA: Diagnosis not present

## 2021-12-23 DIAGNOSIS — E561 Deficiency of vitamin K: Secondary | ICD-10-CM | POA: Diagnosis not present

## 2021-12-23 DIAGNOSIS — E7800 Pure hypercholesterolemia, unspecified: Secondary | ICD-10-CM | POA: Diagnosis not present

## 2021-12-24 ENCOUNTER — Encounter: Payer: Self-pay | Admitting: Sports Medicine

## 2021-12-24 DIAGNOSIS — E119 Type 2 diabetes mellitus without complications: Secondary | ICD-10-CM | POA: Insufficient documentation

## 2021-12-30 ENCOUNTER — Encounter: Payer: Medicare (Managed Care) | Admitting: Sports Medicine

## 2021-12-30 LAB — CBC
HCT: 40.8 % (ref 35.0–45.0)
Hemoglobin: 12.7 g/dL (ref 11.7–15.5)
MCH: 27.4 pg (ref 27.0–33.0)
MCHC: 31.1 g/dL — ABNORMAL LOW (ref 32.0–36.0)
MCV: 88.1 fL (ref 80.0–100.0)
MPV: 10.3 fL (ref 7.5–12.5)
Platelets: 322 10*3/uL (ref 140–400)
RBC: 4.63 10*6/uL (ref 3.80–5.10)
RDW: 14 % (ref 11.0–15.0)
WBC: 4.9 10*3/uL (ref 3.8–10.8)

## 2021-12-30 LAB — LIPID PANEL
Cholesterol: 168 mg/dL (ref <200)
HDL: 60 mg/dL (ref >=50)
LDL Cholesterol (Calc): 89 mg/dL (calc)
Non-HDL Cholesterol (Calc): 108 mg/dL (calc) (ref <130)
Total CHOL/HDL Ratio: 2.8 (calc) (ref <5.0)
Triglycerides: 93 mg/dL (ref <150)

## 2021-12-30 LAB — COMPREHENSIVE METABOLIC PANEL
AG Ratio: 1.6 (calc) (ref 1.0–2.5)
ALT: 22 U/L (ref 6–29)
AST: 18 U/L (ref 10–35)
Albumin: 4.1 g/dL (ref 3.6–5.1)
Alkaline phosphatase (APISO): 61 U/L (ref 37–153)
BUN: 13 mg/dL (ref 7–25)
CO2: 29 mmol/L (ref 20–32)
Calcium: 9.5 mg/dL (ref 8.6–10.4)
Chloride: 103 mmol/L (ref 98–110)
Creat: 0.75 mg/dL (ref 0.50–1.03)
Globulin: 2.6 g/dL (calc) (ref 1.9–3.7)
Glucose, Bld: 102 mg/dL — ABNORMAL HIGH (ref 65–99)
Potassium: 4.8 mmol/L (ref 3.5–5.3)
Sodium: 139 mmol/L (ref 135–146)
Total Bilirubin: 0.4 mg/dL (ref 0.2–1.2)
Total Protein: 6.7 g/dL (ref 6.1–8.1)

## 2021-12-30 LAB — HEMOGLOBIN A1C
Hgb A1c MFr Bld: 6.5 % of total Hgb — ABNORMAL HIGH (ref <5.7)
Mean Plasma Glucose: 140 mg/dL
eAG (mmol/L): 7.7 mmol/L

## 2021-12-30 LAB — VITAMIN K1, SERUM: Vitamin K: 1180 pg/mL (ref 130–1500)

## 2021-12-30 LAB — TSH: TSH: 2.89 mIU/L

## 2022-01-20 DIAGNOSIS — M961 Postlaminectomy syndrome, not elsewhere classified: Secondary | ICD-10-CM | POA: Diagnosis not present

## 2022-01-20 DIAGNOSIS — M47816 Spondylosis without myelopathy or radiculopathy, lumbar region: Secondary | ICD-10-CM | POA: Diagnosis not present

## 2022-01-20 DIAGNOSIS — G894 Chronic pain syndrome: Secondary | ICD-10-CM | POA: Diagnosis not present

## 2022-01-20 DIAGNOSIS — M5416 Radiculopathy, lumbar region: Secondary | ICD-10-CM | POA: Diagnosis not present

## 2022-01-20 DIAGNOSIS — M7918 Myalgia, other site: Secondary | ICD-10-CM | POA: Diagnosis not present

## 2022-02-12 DIAGNOSIS — I10 Essential (primary) hypertension: Secondary | ICD-10-CM | POA: Diagnosis not present

## 2022-02-12 DIAGNOSIS — E119 Type 2 diabetes mellitus without complications: Secondary | ICD-10-CM | POA: Diagnosis not present

## 2022-02-12 DIAGNOSIS — E039 Hypothyroidism, unspecified: Secondary | ICD-10-CM | POA: Diagnosis not present

## 2022-02-23 ENCOUNTER — Other Ambulatory Visit: Payer: Self-pay

## 2022-02-23 DIAGNOSIS — I6523 Occlusion and stenosis of bilateral carotid arteries: Secondary | ICD-10-CM

## 2022-03-05 DIAGNOSIS — J029 Acute pharyngitis, unspecified: Secondary | ICD-10-CM | POA: Diagnosis not present

## 2022-03-05 DIAGNOSIS — Z20822 Contact with and (suspected) exposure to covid-19: Secondary | ICD-10-CM | POA: Diagnosis not present

## 2022-03-05 DIAGNOSIS — R5383 Other fatigue: Secondary | ICD-10-CM | POA: Diagnosis not present

## 2022-03-05 DIAGNOSIS — R059 Cough, unspecified: Secondary | ICD-10-CM | POA: Diagnosis not present

## 2022-03-05 DIAGNOSIS — I10 Essential (primary) hypertension: Secondary | ICD-10-CM | POA: Diagnosis not present

## 2022-03-08 ENCOUNTER — Emergency Department
Admission: EM | Admit: 2022-03-08 | Discharge: 2022-03-08 | Disposition: A | Discharge status: Home / Self Care | Payer: Medicare (Managed Care) | Source: Home / Self Care

## 2022-03-08 ENCOUNTER — Telehealth: Payer: Self-pay | Admitting: Emergency Medicine

## 2022-03-08 ENCOUNTER — Encounter: Payer: Self-pay | Admitting: Emergency Medicine

## 2022-03-08 ENCOUNTER — Other Ambulatory Visit: Payer: Self-pay

## 2022-03-08 DIAGNOSIS — J0100 Acute maxillary sinusitis, unspecified: Secondary | ICD-10-CM | POA: Diagnosis not present

## 2022-03-08 DIAGNOSIS — J309 Allergic rhinitis, unspecified: Secondary | ICD-10-CM | POA: Diagnosis not present

## 2022-03-08 LAB — POCT RAPID STREP A (OFFICE): Rapid Strep A Screen: NEGATIVE

## 2022-03-08 MED ORDER — AMOXICILLIN-POT CLAVULANATE 875-125 MG PO TABS: 1.0000 | ORAL_TABLET | Freq: Two times a day (BID) | ORAL | 0 refills | Status: AC

## 2022-03-08 MED ORDER — FEXOFENADINE HCL 180 MG PO TABS: 180.0000 mg | ORAL_TABLET | Freq: Every day | ORAL | 0 refills | Status: DC

## 2022-03-08 NOTE — ED Provider Notes (Signed)
[name]    CSN: [id number] Arrival date & time: 03/08/22  1303      History   Chief Complaint Chief Complaint  Patient presents with   Nasal Congestion    HPI Kelsey Snow is a 56 y.o. female.   HPI 56 year old female presents with cough nasal congestion and sore throat for 1 week.  PMH significant for obesity, OCD, and chronic pain syndrome.  Patient is accompanied by her husband this afternoon.  Past Medical History:  Diagnosis Date   Anemia    Anxiety    Arthritis    Carpal tunnel syndrome    Depression    Essential hypertension, benign 03/07/2015   GERD (gastroesophageal reflux disease)    History of degenerative disc disease    Hx of ectopic pregnancy    Hyperlipidemia    Lumbosacral spondylolysis    Obese    OCD (obsessive compulsive disorder)    Thyroid disease     Patient Active Problem List   Diagnosis Date Noted   Controlled type 2 diabetes mellitus without complication, without long-term current use of insulin ([place]) 12/24/2021   COVID-19 04/16/2021   Tinnitus 11/13/2020   Lesion of dorsum of nose 08/12/2020   Right foot injury 07/24/2020   Hemangioma 05/27/2020   Insomnia 05/09/2020   History of endometrial ablation 01/10/2020   Epigastric pain 12/07/2019   Sacroiliac joint pain 07/28/2018   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 12/16/2017   Lumbar radiculopathy 11/29/2017   Strain of peroneal tendon, left, initial encounter 05/18/2017   Chronic pain syndrome 11/26/2016   Facet arthropathy, cervical 06/05/2016   Postlaminectomy syndrome of lumbar region 05/01/2015   Carpal tunnel syndrome, left, status post right carpal tunnel release 11/29/2014   Foraminal stenosis of cervical region 05/03/2014   Low back pain 05/03/2014   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   Cervical spondylosis without myelopathy 04/13/2014   Perennial allergic rhinitis 11/16/2013   Obsessive compulsive disorder  with depression and anxiety 10/10/2013   Hypothyroidism 10/10/2013   Obesity 10/10/2013   Annual physical exam 10/10/2013   Lumbar degenerative disc disease 10/10/2013   Hypercholesterolemia 05/12/2013   Esophageal reflux 03/25/2013    Past Surgical History:  Procedure Laterality Date   BACK SURGERY     L5 to S1 discectomy   ECTOPIC PREGNANCY SURGERY     ENDOMETRIAL ABLATION     ESOPHAGOGASTRODUODENOSCOPY     in 20's had pepic ulcers    TUBAL LIGATION     TUBAL LIGATION     UPPER GASTROINTESTINAL ENDOSCOPY      OB History   No obstetric history on file.      Home Medications    Prior to Admission medications   Medication Sig Start Date End Date Taking? Authorizing Provider  amoxicillin-clavulanate (AUGMENTIN) 875-125 MG tablet Take 1 tablet by mouth 2 (two) times daily for 10 days. 03/08/22 03/18/22 Yes Eliezer Lofts, FNP  fexofenadine [facility] ALLERGY) 180 MG tablet Take 1 tablet (180 mg total) by mouth daily for 15 days. 03/08/22 03/23/22 Yes Eliezer Lofts, FNP  aspirin 81 MG EC tablet TAKE 1 TABLET (81 MG TOTAL) BY MOUTH DAILY. 08/22/15   Silverio Decamp, MD  EPINEPHrine 0.3 mg/0.3 mL IJ SOAJ injection Inject 0.3 mg into the muscle as needed for anaphylaxis. 05/26/21   Silverio Decamp, MD  levothyroxine (SYNTHROID) 137 MCG tablet TAKE ONE TABLET BY MOUTH DAILY BEFORE BREAKFAST 12/01/21   Silverio Decamp,  MD  omeprazole (PRILOSEC) 40 MG capsule TAKE ONE CAPSULE BY MOUTH EVERY MORNING AND AT BEDTIME 09/01/21   Silverio Decamp, MD  rosuvastatin (CRESTOR) 10 MG tablet TAKE ONE TABLET BY MOUTH DAILY 07/08/21   Silverio Decamp, MD    Family History Family History  Problem Relation Age of Onset   Aneurysm Father    AAA (abdominal aortic aneurysm) Father    Depression Mother    Cancer Mother        Pancreatic   Thyroid disease Mother        hypothyroidism   Varicose Veins Mother    Colon polyps Mother    Drug abuse Sister    Hyperlipidemia  Brother    Diabetes Maternal Grandmother    Alcohol abuse Maternal Uncle    Esophageal cancer Neg Hx    Colon cancer Neg Hx    Rectal cancer Neg Hx    Stomach cancer Neg Hx     Social History Social History   Tobacco Use   Smoking status: Never   Smokeless tobacco: Never  Vaping Use   Vaping Use: Never used  Substance Use Topics   Alcohol use: Yes    Alcohol/week: 6.0 standard drinks    Types: 6 Standard drinks or equivalent per week    Comment: weekly   Drug use: No     Allergies   Bee venom and Topiramate   Review of Systems Review of Systems  HENT:  Positive for congestion and sore throat.   Respiratory:  Positive for cough.   All other systems reviewed and are negative.   Physical Exam Triage Vital Signs ED Triage Vitals  Enc Vitals Group     BP      Pulse      Resp      Temp      Temp src      SpO2      Weight      Height      Head Circumference      Peak Flow      Pain Score      Pain Loc      Pain Edu?      Excl. in [place]?    No data found.  Updated Vital Signs BP (!) 149/81 (BP Location: Left Arm)   Pulse 78   Temp 98.8 F (37.1 C) (Oral)   Resp 18   Ht '5\' 5"'$  (1.651 m)   Wt 253 lb 15.5 oz (115.2 kg)   LMP 02/26/2014   SpO2 99%   BMI 42.26 kg/m      Physical Exam Vitals and nursing note reviewed.  Constitutional:      Appearance: Normal appearance. She is obese. She is not ill-appearing.  HENT:     Head: Normocephalic and atraumatic.     Right Ear: Tympanic membrane and external ear normal.     Left Ear: Tympanic membrane and external ear normal.     Ears:     Comments: Moderate eustachian tube dysfunction noted bilaterally    Nose:     Comments: Turbinates are erythematous/edematous    Mouth/Throat:     Mouth: Mucous membranes are moist.     Pharynx: Oropharynx is clear.     Comments: Moderate amount of clear drainage of posterior oropharynx noted Eyes:     Extraocular Movements: Extraocular movements intact.      Conjunctiva/sclera: Conjunctivae normal.     Pupils: Pupils are equal, round, and reactive to light.  Cardiovascular:     Rate and Rhythm: Normal rate and regular rhythm.     Pulses: Normal pulses.     Heart sounds: Normal heart sounds. No murmur heard. Pulmonary:     Effort: Pulmonary effort is normal.     Breath sounds: Normal breath sounds. No wheezing, rhonchi or rales.  Musculoskeletal:     Cervical back: Normal range of motion and neck supple.  Skin:    General: Skin is warm and dry.  Neurological:     General: No focal deficit present.     Mental Status: She is alert and oriented to person, place, and time.     UC Treatments / Results  Labs (all labs ordered are listed, but only abnormal results are displayed) Labs Reviewed  POCT RAPID STREP A (OFFICE)    EKG   Radiology No results found.  Procedures Procedures (including critical care time)  Medications Ordered in UC Medications - No data to display  Initial Impression / Assessment and Plan / UC Course  I have reviewed the triage vital signs and the nursing notes.  Pertinent labs & imaging results that were available during my care of the patient were reviewed by me and considered in my medical decision making (see chart for details).     MDM: 1.  Acute maxillary sinusitis, recurrence not specified-Rx'd Augmentin; 2.  Allergic rhinitis, unspecified seasonality, unspecified trigger-Rx'd Allegra. Instructed patient to take medication as directed with food to completion.  Advised patient to take Allegra with first dose of Augmentin for the next 5 of 10 days.  Advised may use Allegra as needed afterwards for concurrent postnasal drainage/drip.  Encouraged patient to increase daily water intake while taking these medications.  Advised patient if symptoms worsen and/or unresolved please follow-up with PCP for further evaluation.  Patient discharged home, hemodynamically stable. Final Clinical Impressions(s) / UC  Diagnoses   Final diagnoses:  Acute maxillary sinusitis, recurrence not specified  Allergic rhinitis, unspecified seasonality, unspecified trigger     Discharge Instructions      Instructed patient to take medication as directed with food to completion.  Advised patient to take Allegra with first dose of Augmentin for the next 5 of 10 days.  Advised may use Allegra as needed afterwards for concurrent postnasal drainage/drip.  Encouraged patient to increase daily water intake while taking these medications.  Advised patient if symptoms worsen and/or unresolved please follow-up with PCP for further evaluation.     ED Prescriptions     Medication Sig Dispense Auth. Provider   amoxicillin-clavulanate (AUGMENTIN) 875-125 MG tablet Take 1 tablet by mouth 2 (two) times daily for 10 days. 20 tablet Eliezer Lofts, FNP   fexofenadine [facility] ALLERGY) 180 MG tablet Take 1 tablet (180 mg total) by mouth daily for 15 days. 15 tablet Eliezer Lofts, FNP      PDMP not reviewed this encounter.   Eliezer Lofts, [place] 03/08/22 1414

## 2022-03-08 NOTE — ED Triage Notes (Addendum)
Sick x 1 week  Congestion, sore throat, cough Nyquil, tylenol cold & sinus - min relief Flonase - not doing - gives her  head ache  Zyrtec D - no relief  Negative COVID & Flu test - office visit

## 2022-03-08 NOTE — Discharge Instructions (Addendum)
Instructed patient to take medication as directed with food to completion.  Advised patient to take Allegra with first dose of Augmentin for the next 5 of 10 days.  Advised may use Allegra as needed afterwards for concurrent postnasal drainage/drip.  Encouraged patient to increase daily water intake while taking these medications.  Advised patient if symptoms worsen and/or unresolved please follow-up with PCP for further evaluation.

## 2022-03-08 NOTE — Telephone Encounter (Signed)
Voicemail from pharmacist regarding antibiotic & possible allergy. Call back to pharmacy regarding possible cefdinir allergy (no known allergy noted in chart nor did pt state an allergy during visit today). Pharmacist confirmed he did speak with Merna earlier via phone & patient confirmed that she did not have an allergy to cefdinir. No changes is current prescriptions or call back to [facility] at this time by RN

## 2022-03-08 NOTE — ED Notes (Signed)
No strep culture per provider

## 2022-03-09 ENCOUNTER — Ambulatory Visit (INDEPENDENT_AMBULATORY_CARE_PROVIDER_SITE_OTHER): Payer: Medicare (Managed Care) | Admitting: Physician Assistant

## 2022-03-09 ENCOUNTER — Ambulatory Visit (HOSPITAL_COMMUNITY)
Admission: RE | Admit: 2022-03-09 | Discharge: 2022-03-09 | Disposition: A | Discharge status: Home / Self Care | Payer: Medicare (Managed Care) | Source: Ambulatory Visit | Attending: Vascular Surgery | Admitting: Vascular Surgery

## 2022-03-09 ENCOUNTER — Encounter: Payer: Self-pay | Admitting: Physician Assistant

## 2022-03-09 VITALS — BP 174/102 | HR 70 | Temp 98.0°F | Resp 16 | Ht 65.0 in | Wt 264.0 lb

## 2022-03-09 DIAGNOSIS — I6523 Occlusion and stenosis of bilateral carotid arteries: Secondary | ICD-10-CM | POA: Insufficient documentation

## 2022-03-09 NOTE — Progress Notes (Signed)
History of Present Illness:  Patient is a 56 y.o. year old female who presents for evaluation of carotid stenosis.   She originally presented with left neck pain and She was found to have asymptomatic L ICA dissection and thrombus on an MRI for neck pain issues.  She is being followed for B ICA stenosis.   The patient denies symptoms of TIA, amaurosis, or stroke.  She has a head cold currently, other wise she has no new complaints.   She is medically managed on ASA and Statin daily.  Past Medical History:  Diagnosis Date   Anemia    Anxiety    Arthritis    Carpal tunnel syndrome    Depression    Essential hypertension, benign 03/07/2015   GERD (gastroesophageal reflux disease)    History of degenerative disc disease    Hx of ectopic pregnancy    Hyperlipidemia    Lumbosacral spondylolysis    Obese    OCD (obsessive compulsive disorder)    Thyroid disease     Past Surgical History:  Procedure Laterality Date   BACK SURGERY     L5 to S1 discectomy   ECTOPIC PREGNANCY SURGERY     ENDOMETRIAL ABLATION     ESOPHAGOGASTRODUODENOSCOPY     in 20's had pepic ulcers    TUBAL LIGATION     TUBAL LIGATION     UPPER GASTROINTESTINAL ENDOSCOPY       Social History Social History   Tobacco Use   Smoking status: Never   Smokeless tobacco: Never  Vaping Use   Vaping Use: Never used  Substance Use Topics   Alcohol use: Yes    Alcohol/week: 6.0 standard drinks    Types: 6 Standard drinks or equivalent per week    Comment: weekly   Drug use: No    Family History Family History  Problem Relation Age of Onset   Aneurysm Father    AAA (abdominal aortic aneurysm) Father    Depression Mother    Cancer Mother        Pancreatic   Thyroid disease Mother        hypothyroidism   Varicose Veins Mother    Colon polyps Mother    Drug abuse Sister    Hyperlipidemia Brother    Diabetes Maternal Grandmother    Alcohol abuse Maternal Uncle    Esophageal cancer Neg Hx    Colon  cancer Neg Hx    Rectal cancer Neg Hx    Stomach cancer Neg Hx     Allergies  Allergies  Allergen Reactions   Bee Venom Anaphylaxis   Topiramate Other (See Comments)     Current Outpatient Medications  Medication Sig Dispense Refill   amoxicillin-clavulanate (AUGMENTIN) 875-125 MG tablet Take 1 tablet by mouth 2 (two) times daily for 10 days. 20 tablet 0   aspirin 81 MG EC tablet TAKE 1 TABLET (81 MG TOTAL) BY MOUTH DAILY. 90 tablet 3   EPINEPHrine 0.3 mg/0.3 mL IJ SOAJ injection Inject 0.3 mg into the muscle as needed for anaphylaxis. 1 each 0   fexofenadine (ALLEGRA ALLERGY) 180 MG tablet Take 1 tablet (180 mg total) by mouth daily for 15 days. 15 tablet 0   levothyroxine (SYNTHROID) 137 MCG tablet TAKE ONE TABLET BY MOUTH DAILY BEFORE BREAKFAST 30 tablet 3   omeprazole (PRILOSEC) 40 MG capsule TAKE ONE CAPSULE BY MOUTH EVERY MORNING AND AT BEDTIME 60 capsule 3   rosuvastatin (CRESTOR) 10 MG tablet TAKE ONE TABLET  BY MOUTH DAILY 90 tablet 1   No current facility-administered medications for this visit.    ROS:   General:  No weight loss, Fever, chills  HEENT: No recent headaches, no nasal bleeding, no visual changes, no sore throat  Neurologic: No dizziness, blackouts, seizures. No recent symptoms of stroke or mini- stroke. No recent episodes of slurred speech, or temporary blindness.  Cardiac: No recent episodes of chest pain/pressure, no shortness of breath at rest.  No shortness of breath with exertion.  Denies history of atrial fibrillation or irregular heartbeat  Vascular: No history of rest pain in feet.  No history of claudication.  No history of non-healing ulcer, No history of DVT   Pulmonary: No home oxygen, no productive cough, no hemoptysis,  No asthma or wheezing  Musculoskeletal:  '[ ]'$  Arthritis, '[ ]'$  Low back pain,  '[ ]'$  Joint pain  Hematologic:No history of hypercoagulable state.  No history of easy bleeding.  No history of anemia  Gastrointestinal: No  hematochezia or melena,  No gastroesophageal reflux, no trouble swallowing  Urinary: '[ ]'$  chronic Kidney disease, '[ ]'$  on HD - '[ ]'$  MWF or '[ ]'$  TTHS, '[ ]'$  Burning with urination, '[ ]'$  Frequent urination, '[ ]'$  Difficulty urinating;   Skin: No rashes  Psychological: No history of anxiety,  No history of depression   Physical Examination  Vitals:   03/09/22 0858 03/09/22 0902  BP: (!) 152/98 (!) 174/102  Pulse: 71 70  Resp: 16   Temp: 98 F (36.7 C)   TempSrc: Temporal   SpO2: 100%   Weight: 264 lb (119.7 kg)   Height: '5\' 5"'$  (1.651 m)     Body mass index is 43.93 kg/m.  General:  Alert and oriented, no acute distress HEENT: Normal Neck: No bruit or JVD Pulmonary: Clear to auscultation bilaterally Cardiac: Regular Rate and Rhythm without murmur Gastrointestinal: Soft, non-tender, non-distended, no mass, no scars Skin: No rash Extremity Pulses:  2+ radial Neurologic: Upper and lower extremity motor 5/5 and symmetric  DATA:     Right Carotid Findings:  +----------+--------+--------+--------+------------------+--------+            PSV cm/sEDV cm/sStenosisPlaque DescriptionComments  +----------+--------+--------+--------+------------------+--------+  CCA Prox  75      19                                          +----------+--------+--------+--------+------------------+--------+  CCA Mid   95      38                                          +----------+--------+--------+--------+------------------+--------+  CCA Distal78      32                                          +----------+--------+--------+--------+------------------+--------+  ICA Prox  65      22      1-39%                               +----------+--------+--------+--------+------------------+--------+  ICA Mid   52      29                                          +----------+--------+--------+--------+------------------+--------+  ICA Distal175     72      60-79%                     tortuous  +----------+--------+--------+--------+------------------+--------+  ECA       89      24                                          +----------+--------+--------+--------+------------------+--------+   +----------+--------+-------+----------------+-------------------+            PSV cm/sEDV cmsDescribe        Arm Pressure (mmHG)  +----------+--------+-------+----------------+-------------------+  Subclavian122            Multiphasic, WNL                     +----------+--------+-------+----------------+-------------------+   +---------+--------+--+--------+--+---------+  VertebralPSV cm/s29EDV cm/s12Antegrade  +---------+--------+--+--------+--+---------+       Left Carotid Findings:  +----------+--------+--------+--------+------------------+--------+            PSV cm/sEDV cm/sStenosisPlaque DescriptionComments  +----------+--------+--------+--------+------------------+--------+  CCA Prox  93      29                                          +----------+--------+--------+--------+------------------+--------+  CCA Mid   97      36                                          +----------+--------+--------+--------+------------------+--------+  CCA Distal89      31                                          +----------+--------+--------+--------+------------------+--------+  ICA Prox  73      33      1-39%                               +----------+--------+--------+--------+------------------+--------+  ICA Mid   102     47      40-59%                    tortuous  +----------+--------+--------+--------+------------------+--------+  ICA Distal101     34                                          +----------+--------+--------+--------+------------------+--------+  ECA       93      26                                          +----------+--------+--------+--------+------------------+--------+    +----------+--------+--------+----------------+-------------------+            PSV cm/sEDV cm/sDescribe        Arm Pressure (mmHG)  +----------+--------+--------+----------------+-------------------+  [id number]             Multiphasic, WNL                     +----------+--------+--------+----------------+-------------------+   +---------+--------+--+--------+--+---------+  VertebralPSV cm/s32EDV cm/s13Antegrade  +---------+--------+--+--------+--+---------+           Summary:  Right Carotid: Velocities in the right ICA are consistent with a 60-79%                 stenosis. Degree of stenosis likely due to tortuosity.   Left Carotid: Velocities in the left ICA are consistent with a 40-59%  stenosis.                Degree of stenosis likely due to tortuosity.   Vertebrals:  Bilateral vertebral arteries demonstrate antegrade flow.  Subclavians: Normal flow hemodynamics were seen in bilateral subclavian               arteries.   ASSESSMENT/PLAN: Asymptomatic carotid stenosis The duplex B ICA's has shown small increment increase in velocity.  Right ICA < 79% with EDV 72 from 68 on previous and Left ICA < 59% with EDV of 47 from 44. She is maintained on medical therapy with ASA and Statin daily.   She remains asymptomatic for stroke/TIA.  She has started a healthy diet and is trying to increase her activity.  F/U for repeat study in 6 months.  If her stenosis is > 80% she will need intervention with CEA verse TCAR.      Roxy Horseman PA-C [facility] [phone]  MD in clinic [place]

## 2022-03-10 DIAGNOSIS — G894 Chronic pain syndrome: Secondary | ICD-10-CM | POA: Diagnosis not present

## 2022-03-10 DIAGNOSIS — M47816 Spondylosis without myelopathy or radiculopathy, lumbar region: Secondary | ICD-10-CM | POA: Diagnosis not present

## 2022-03-10 DIAGNOSIS — M5416 Radiculopathy, lumbar region: Secondary | ICD-10-CM | POA: Diagnosis not present

## 2022-03-10 DIAGNOSIS — M7918 Myalgia, other site: Secondary | ICD-10-CM | POA: Diagnosis not present

## 2022-03-10 DIAGNOSIS — M961 Postlaminectomy syndrome, not elsewhere classified: Secondary | ICD-10-CM | POA: Diagnosis not present

## 2022-03-12 ENCOUNTER — Other Ambulatory Visit: Payer: Self-pay | Admitting: *Deleted

## 2022-03-12 DIAGNOSIS — I6523 Occlusion and stenosis of bilateral carotid arteries: Secondary | ICD-10-CM

## 2022-03-30 ENCOUNTER — Other Ambulatory Visit: Payer: Self-pay | Admitting: Sports Medicine

## 2022-03-30 DIAGNOSIS — E039 Hypothyroidism, unspecified: Secondary | ICD-10-CM

## 2022-04-18 LAB — HM DIABETES EYE EXAM

## 2022-04-23 ENCOUNTER — Encounter (INDEPENDENT_AMBULATORY_CARE_PROVIDER_SITE_OTHER): Payer: Medicare (Managed Care) | Admitting: Sports Medicine

## 2022-04-23 DIAGNOSIS — E119 Type 2 diabetes mellitus without complications: Secondary | ICD-10-CM | POA: Diagnosis not present

## 2022-04-23 NOTE — Telephone Encounter (Signed)
I spent 5 total minutes of online digital evaluation and management services in this patient-initiated request for online care. 

## 2022-05-07 DIAGNOSIS — E119 Type 2 diabetes mellitus without complications: Secondary | ICD-10-CM | POA: Diagnosis not present

## 2022-05-08 LAB — HEMOGLOBIN A1C
Hgb A1c MFr Bld: 6 % of total Hgb — ABNORMAL HIGH (ref <5.7)
Mean Plasma Glucose: 126 mg/dL
eAG (mmol/L): 7 mmol/L

## 2022-05-08 LAB — TSH: TSH: 0.95 mIU/L

## 2022-05-15 ENCOUNTER — Ambulatory Visit (INDEPENDENT_AMBULATORY_CARE_PROVIDER_SITE_OTHER): Payer: Medicare (Managed Care) | Admitting: Sports Medicine

## 2022-05-15 ENCOUNTER — Encounter: Payer: Self-pay | Admitting: Sports Medicine

## 2022-05-15 VITALS — BP 132/81 | HR 76 | Ht 65.0 in | Wt 263.0 lb

## 2022-05-15 DIAGNOSIS — Z0000 Encounter for general adult medical examination without abnormal findings: Secondary | ICD-10-CM | POA: Diagnosis not present

## 2022-05-15 DIAGNOSIS — E119 Type 2 diabetes mellitus without complications: Secondary | ICD-10-CM | POA: Diagnosis not present

## 2022-05-15 LAB — POCT UA - MICROALBUMIN
Albumin/Creatinine Ratio, Urine, POC: <30
Creatinine, POC: 300 mg/dL
Microalbumin Ur, POC: 30 mg/L

## 2022-05-15 NOTE — Progress Notes (Signed)
Subjective:    CC: Annual Physical Exam  HPI:  This patient is here for their annual physical  I reviewed the past medical history, family history, social history, surgical history, and allergies today and no changes were needed.  Please see the problem list section below in epic for further details.  Past Medical History: Past Medical History:  Diagnosis Date   Anemia    Anxiety    Arthritis    Carpal tunnel syndrome    Depression    Essential hypertension, benign 03/07/2015   GERD (gastroesophageal reflux disease)    History of degenerative disc disease    Hx of ectopic pregnancy    Hyperlipidemia    Lumbosacral spondylolysis    Obese    OCD (obsessive compulsive disorder)    Thyroid disease    Past Surgical History: Past Surgical History:  Procedure Laterality Date   BACK SURGERY     L5 to S1 discectomy   ECTOPIC PREGNANCY SURGERY     ENDOMETRIAL ABLATION     ESOPHAGOGASTRODUODENOSCOPY     in 20's had pepic ulcers    TUBAL LIGATION     TUBAL LIGATION     UPPER GASTROINTESTINAL ENDOSCOPY     Social History: Social History   Socioeconomic History   Marital status: Married    Spouse name: Not on file   Number of children: 1   Years of education: 12   Highest education level: Not on file  Occupational History    Employer: [employer]  Tobacco Use   Smoking status: Never   Smokeless tobacco: Never  Vaping Use   Vaping Use: Never used  Substance and Sexual Activity   Alcohol use: Yes    Alcohol/week: 6.0 standard drinks of alcohol    Types: 6 Standard drinks or equivalent per week    Comment: weekly   Drug use: No   Sexual activity: Not on file  Other Topics Concern   Not on file  Social History Narrative   Not on file   Social Determinants of Health   Financial Resource Strain: Not on file  Food Insecurity: Not on file  Transportation Needs: Not on file  Physical Activity: Not on file  Stress: Not on file  Social Connections: Not on  file   Family History: Family History  Problem Relation Age of Onset   Aneurysm Father    AAA (abdominal aortic aneurysm) Father    Depression Mother    Cancer Mother        Pancreatic   Thyroid disease Mother        hypothyroidism   Varicose Veins Mother    Colon polyps Mother    Drug abuse Sister    Hyperlipidemia Brother    Diabetes Maternal Grandmother    Alcohol abuse Maternal Uncle    Esophageal cancer Neg Hx    Colon cancer Neg Hx    Rectal cancer Neg Hx    Stomach cancer Neg Hx    Allergies: Allergies  Allergen Reactions   Bee Venom Anaphylaxis   Topiramate Other (See Comments)   Medications: See med rec.  Review of Systems: No headache, visual changes, nausea, vomiting, diarrhea, constipation, dizziness, abdominal pain, skin rash, fevers, chills, night sweats, swollen lymph nodes, weight loss, chest pain, body aches, joint swelling, muscle aches, shortness of breath, mood changes, visual or auditory hallucinations.  Objective:    General: Well Developed, well nourished, and in no acute distress.  Neuro: Alert and oriented x3, extra-ocular muscles  intact, sensation grossly intact. Cranial nerves II through XII are intact, motor, sensory, and coordinative functions are all intact. HEENT: Normocephalic, atraumatic, pupils equal round reactive to light, neck supple, no masses, no lymphadenopathy, thyroid nonpalpable. Oropharynx, nasopharynx, external ear canals are unremarkable. Skin: Warm and dry, no rashes noted.  Cardiac: Regular rate and rhythm, no murmurs rubs or gallops.  Respiratory: Clear to auscultation bilaterally. Not using accessory muscles, speaking in full sentences.  Abdominal: Soft, nontender, nondistended, positive bowel sounds, no masses, no organomegaly.  Musculoskeletal: Shoulder, elbow, wrist, hip, knee, ankle stable, and with full range of motion.  Impression and Recommendations:    The patient was counselled, risk factors were discussed,  anticipatory guidance given.  Annual physical exam Annual physical as above. Return in 1 year for this.  Controlled type 2 diabetes mellitus without complication, without long-term current use of insulin (HCC) Diabetes mellitus type 2, diet controlled with hemoglobin A1c of 6%. 4 months ago she was at 6.5%. She will continue to work on weight loss, we will set a target of 10 pounds over the next 3 months and if she does not hit the target we will add metformin and a GLP-1. Urine microalbumin will be done today. We will also get her diabetic eye exam records.   ____________________________________________ [name]. [name], M.D., ABFM., CAQSM., AME. Primary Care and Sports Medicine [facility]  Adjunct Professor of [place] [employer] Medicine  Risk manager

## 2022-05-15 NOTE — Assessment & Plan Note (Signed)
Annual physical as above. Return in 1 year for this.

## 2022-05-15 NOTE — Assessment & Plan Note (Signed)
Diabetes mellitus type 2, diet controlled with hemoglobin A1c of 6%. 4 months ago she was at 6.5%. She will continue to work on weight loss, we will set a target of 10 pounds over the next 3 months and if she does not hit the target we will add metformin and a GLP-1. Urine microalbumin will be done today. We will also get her diabetic eye exam records.

## 2022-05-20 ENCOUNTER — Emergency Department (INDEPENDENT_AMBULATORY_CARE_PROVIDER_SITE_OTHER): Payer: Medicare (Managed Care)

## 2022-05-20 ENCOUNTER — Emergency Department
Admission: EM | Admit: 2022-05-20 | Discharge: 2022-05-20 | Disposition: A | Discharge status: Home / Self Care | Payer: Medicare (Managed Care) | Attending: Family Medicine | Admitting: Family Medicine

## 2022-05-20 DIAGNOSIS — M25532 Pain in left wrist: Secondary | ICD-10-CM

## 2022-05-20 MED ORDER — METHOCARBAMOL 500 MG PO TABS: 500.0000 mg | ORAL_TABLET | Freq: Three times a day (TID) | ORAL | 0 refills | Status: DC | PRN

## 2022-05-20 MED ORDER — PREDNISONE 10 MG (21) PO TBPK: ORAL_TABLET | Freq: Every day | ORAL | 0 refills | Status: DC

## 2022-05-20 NOTE — ED Provider Notes (Signed)
[facility]    CSN: [id number] Arrival date & time: 05/20/22  6578      History   Chief Complaint Chief Complaint  Patient presents with   Wrist Pain    HPI Kelsey Snow is a 56 y.o. female.   HPI Very pleasant 56 year old female presents with worsening left wrist pain and swelling for 3 days.  She reports no known injury.  Left wrist (medial aspect) rea noted to be red and swollen during triage this morning.  Gravity significant for morbid obesity, T2DM, carpal tunnel syndrome, arthritis, and OCD.  Patient is accompanied by her husband this morning.  Past Medical History:  Diagnosis Date   Anemia    Anxiety    Arthritis    Carpal tunnel syndrome    Depression    Essential hypertension, benign 03/07/2015   GERD (gastroesophageal reflux disease)    History of degenerative disc disease    Hx of ectopic pregnancy    Hyperlipidemia    Lumbosacral spondylolysis    Obese    OCD (obsessive compulsive disorder)    Thyroid disease     Patient Active Problem List   Diagnosis Date Noted   Controlled type 2 diabetes mellitus without complication, without long-term current use of insulin ([place]) 12/24/2021   COVID-19 04/16/2021   Tinnitus 11/13/2020   Lesion of dorsum of nose 08/12/2020   Right foot injury 07/24/2020   Hemangioma 05/27/2020   Insomnia 05/09/2020   History of endometrial ablation 01/10/2020   Epigastric pain 12/07/2019   Sacroiliac joint pain 07/28/2018   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 12/16/2017   Lumbar radiculopathy 11/29/2017   Strain of peroneal tendon, left, initial encounter 05/18/2017   Chronic pain syndrome 11/26/2016   Facet arthropathy, cervical 06/05/2016   Postlaminectomy syndrome of lumbar region 05/01/2015   Carpal tunnel syndrome, left, status post right carpal tunnel release 11/29/2014   Foraminal stenosis of cervical region 05/03/2014   Low back pain 05/03/2014   Carotid artery  dissection (HCC) 04/26/2014   Cervical spondylosis without myelopathy 04/13/2014   Perennial allergic rhinitis 11/16/2013   Obsessive compulsive disorder with depression and anxiety 10/10/2013   Hypothyroidism 10/10/2013   Obesity 10/10/2013   Annual physical exam 10/10/2013   Lumbar degenerative disc disease 10/10/2013   Hypercholesterolemia 05/12/2013   Esophageal reflux 03/25/2013    Past Surgical History:  Procedure Laterality Date   BACK SURGERY     L5 to S1 discectomy   ECTOPIC PREGNANCY SURGERY     ENDOMETRIAL ABLATION     ESOPHAGOGASTRODUODENOSCOPY     in 20's had pepic ulcers    TUBAL LIGATION     TUBAL LIGATION     UPPER GASTROINTESTINAL ENDOSCOPY      OB History   No obstetric history on file.      Home Medications    Prior to Admission medications   Medication Sig Start Date End Date Taking? Authorizing Provider  traMADol (ULTRAM-ER) 200 MG 24 hr tablet Take by mouth. 03/25/22 06/23/22 Yes [provider]  aspirin 81 MG EC tablet TAKE 1 TABLET (81 MG TOTAL) BY MOUTH DAILY. 08/22/15   Silverio Decamp, MD  EPINEPHrine 0.3 mg/0.3 mL IJ SOAJ injection Inject 0.3 mg into the muscle as needed for anaphylaxis. 05/26/21   Silverio Decamp, MD  levothyroxine (SYNTHROID) 137 MCG tablet TAKE ONE TABLET BY MOUTH DAILY BEFORE BREAKFAST 03/30/22   Silverio Decamp, MD  methocarbamol (ROBAXIN) 500  MG tablet Take 1 tablet (500 mg total) by mouth 3 (three) times daily as needed for muscle spasms. 05/20/22  Yes Eliezer Lofts, FNP  omeprazole (PRILOSEC) 40 MG capsule TAKE ONE CAPSULE BY MOUTH EVERY MORNING AND AT BEDTIME 09/01/21   Silverio Decamp, MD  predniSONE (STERAPRED UNI-PAK 21 TAB) 10 MG (21) TBPK tablet Take by mouth daily. Take 6 tabs by mouth daily  for 2 days, then 5 tabs for 2 days, then 4 tabs for 2 days, then 3 tabs for 2 days, 2 tabs for 2 days, then 1 tab by mouth daily for 2 days 05/20/22  Yes Eliezer Lofts, FNP  rosuvastatin (CRESTOR) 10  MG tablet TAKE ONE TABLET BY MOUTH DAILY 07/08/21   Silverio Decamp, MD    Family History Family History  Problem Relation Age of Onset   Aneurysm Father    AAA (abdominal aortic aneurysm) Father    Depression Mother    Cancer Mother        Pancreatic   Thyroid disease Mother        hypothyroidism   Varicose Veins Mother    Colon polyps Mother    Drug abuse Sister    Hyperlipidemia Brother    Diabetes Maternal Grandmother    Alcohol abuse Maternal Uncle    Esophageal cancer Neg Hx    Colon cancer Neg Hx    Rectal cancer Neg Hx    Stomach cancer Neg Hx     Social History Social History   Tobacco Use   Smoking status: Never   Smokeless tobacco: Never  Vaping Use   Vaping Use: Never used  Substance Use Topics   Alcohol use: Yes    Alcohol/week: 6.0 standard drinks of alcohol    Types: 6 Standard drinks or equivalent per week    Comment: weekly   Drug use: No     Allergies   Bee venom and Topiramate   Review of Systems Review of Systems  Musculoskeletal:        Left wrist pain and swelling x3 days     Physical Exam Triage Vital Signs ED Triage Vitals  Enc Vitals Group     BP 05/20/22 1013 (!) 130/92     Pulse Rate 05/20/22 1012 71     Resp 05/20/22 1012 20     Temp 05/20/22 1012 98.4 F (36.9 C)     Temp Source 05/20/22 1012 Oral     SpO2 05/20/22 1012 98 %     Weight 05/20/22 1008 262 lb (118.8 kg)     Height 05/20/22 1008 '5\' 5"'$  (1.651 m)     Head Circumference --      Peak Flow --      Pain Score 05/20/22 1007 10     Pain Loc --      Pain Edu? --      Excl. in [place]? --    No data found.  Updated Vital Signs BP (!) 130/92 (BP Location: Right Arm)   Pulse 71   Temp 98.4 F (36.9 C) (Oral)   Resp 20   Ht '5\' 5"'$  (1.651 m)   Wt 262 lb (118.8 kg)   LMP 02/26/2014   SpO2 98%   BMI 43.60 kg/m    Physical Exam Vitals and nursing note reviewed.  Constitutional:      General: She is not in acute distress.    Appearance: Normal  appearance. She is obese. She is not ill-appearing.  HENT:  Head: Normocephalic and atraumatic.     Mouth/Throat:     Mouth: Mucous membranes are moist.     Pharynx: Oropharynx is clear.  Eyes:     Extraocular Movements: Extraocular movements intact.     Conjunctiva/sclera: Conjunctivae normal.     Pupils: Pupils are equal, round, and reactive to light.  Cardiovascular:     Rate and Rhythm: Normal rate and regular rhythm.     Pulses: Normal pulses.     Heart sounds: Normal heart sounds. No murmur heard. Pulmonary:     Effort: Pulmonary effort is normal.     Breath sounds: Normal breath sounds. No wheezing, rhonchi or rales.  Musculoskeletal:     Cervical back: Normal range of motion and neck supple.     Comments: Left wrist: Unable to perform flexion/extension, TTP over volar aspect of distal radius/ulna, grip 2/5, neurovascular/neurosensory intact  Skin:    General: Skin is warm and dry.  Neurological:     General: No focal deficit present.     Mental Status: She is alert and oriented to person, place, and time.      UC Treatments / Results  Labs (all labs ordered are listed, but only abnormal results are displayed) Labs Reviewed - No data to display  EKG   Radiology DG Wrist Complete Left  Result Date: 05/20/2022 CLINICAL DATA:  LEFT wrist pain EXAM: LEFT WRIST - COMPLETE 3+ VIEW COMPARISON:  Is FINDINGS: No distal radius or ulnar fracture. Radiocarpal joint is intact. No carpal fracture. No soft tissue abnormality. IMPRESSION: No fracture or dislocation. Electronically Signed   By: Suzy Bouchard M.D.   On: 05/20/2022 10:41    Procedures Procedures (including critical care time)  Medications Ordered in UC Medications - No data to display  Initial Impression / Assessment and Plan / UC Course  I have reviewed the triage vital signs and the nursing notes.  Pertinent labs & imaging results that were available during my care of the patient were reviewed by me and  considered in my medical decision making (see chart for details).     MDM: 1.  Acute left wrist pain-left wrist x-ray revealed above.  Rx'd Sterapred pack, Robaxin. Advised/informed patient of left wrist x-ray results with hard copy provided to patient.  Advised patient to wear her left wrist brace 24/7 until being evaluated by PCP tomorrow at regularly scheduled appointment.  Advised patient to take medication as directed with food to completion.  Encouraged patient increase daily water intake while taking this medication.  Advised may use Robaxin daily or as needed for accompanying muscle spasms.  Advised patient if symptoms worsen and/or unresolved please follow-up with PCP or here for further evaluation.  Patient discharged home, hemodynamically stable.  Final Clinical Impressions(s) / UC Diagnoses   Final diagnoses:  Acute pain of left wrist     Discharge Instructions      Advised/informed patient of left wrist x-ray results with hard copy provided to patient.  Advised patient to wear her left wrist brace 24/7 until being evaluated by PCP tomorrow at regularly scheduled appointment.  Advised patient to take medication as directed with food to completion.  Encouraged patient increase daily water intake while taking this medication.  Advised may use Robaxin daily or as needed for accompanying muscle spasms.  Advised patient if symptoms worsen and/or unresolved please follow-up with PCP or here for further evaluation.     ED Prescriptions     Medication Sig Dispense Auth. Provider  predniSONE (STERAPRED UNI-PAK 21 TAB) 10 MG (21) TBPK tablet Take by mouth daily. Take 6 tabs by mouth daily  for 2 days, then 5 tabs for 2 days, then 4 tabs for 2 days, then 3 tabs for 2 days, 2 tabs for 2 days, then 1 tab by mouth daily for 2 days 42 tablet Eliezer Lofts, FNP   methocarbamol (ROBAXIN) 500 MG tablet Take 1 tablet (500 mg total) by mouth 3 (three) times daily as needed for muscle spasms. 30  tablet Eliezer Lofts, FNP      I have reviewed the PDMP during this encounter.   Eliezer Lofts, [place] 05/20/22 1112

## 2022-05-20 NOTE — ED Triage Notes (Signed)
Pt presents to Urgent Care with c/o worsening L wrist pain and swelling x 3 days. No known injury. Area noted to be red and swollen medially.

## 2022-05-20 NOTE — ED Notes (Signed)
Offered Tylenol for pain per standing orders, but pt refused, stating she took Tramadol prior to visit.

## 2022-05-20 NOTE — Discharge Instructions (Addendum)
Advised/informed patient of left wrist x-ray results with hard copy provided to patient.  Advised patient to wear her left wrist brace 24/7 until being evaluated by PCP tomorrow at regularly scheduled appointment.  Advised patient to take medication as directed with food to completion.  Encouraged patient increase daily water intake while taking this medication.  Advised may use Robaxin daily or as needed for accompanying muscle spasms.  Advised patient if symptoms worsen and/or unresolved please follow-up with PCP or here for further evaluation.

## 2022-05-21 ENCOUNTER — Ambulatory Visit: Payer: Medicare (Managed Care) | Admitting: Sports Medicine

## 2022-05-21 ENCOUNTER — Telehealth: Payer: Self-pay

## 2022-05-21 DIAGNOSIS — M25532 Pain in left wrist: Secondary | ICD-10-CM | POA: Diagnosis not present

## 2022-05-21 DIAGNOSIS — M1812 Unilateral primary osteoarthritis of first carpometacarpal joint, left hand: Secondary | ICD-10-CM | POA: Insufficient documentation

## 2022-05-21 DIAGNOSIS — M25432 Effusion, left wrist: Secondary | ICD-10-CM | POA: Insufficient documentation

## 2022-05-21 NOTE — Assessment & Plan Note (Signed)
Pleasant 56 year old female, 4 days ago developed severe pain, swelling, volar left wrist radial aspect. Went to urgent care, x-rays were negative, was given prednisone and symptoms improved considerably. On exam she has a small ganglion cyst volar. This is tender to palpation, she has a negative Finkelstein sign, no tenderness over the first extensor compartment, no tenderness at the first carpometacarpal joint. Suspect inflamed ganglion, the rapidity of onset also suggest a crystalline process. Continue prednisone, wear carpal tunnel brace for the next 2 weeks. Return to see me in 2 weeks, we will consider checking uric acid levels at the follow-up.

## 2022-05-21 NOTE — Progress Notes (Signed)
    Procedures performed today:    None.  Independent interpretation of notes and tests performed by another provider:   None.  Brief History, Exam, Impression, and Recommendations:    Pain and swelling of left wrist Pleasant 56 year old female, 4 days ago developed severe pain, swelling, volar left wrist radial aspect. Went to urgent care, x-rays were negative, was given prednisone and symptoms improved considerably. On exam she has a small ganglion cyst volar. This is tender to palpation, she has a negative Finkelstein sign, no tenderness over the first extensor compartment, no tenderness at the first carpometacarpal joint. Suspect inflamed ganglion, the rapidity of onset also suggest a crystalline process. Continue prednisone, wear carpal tunnel brace for the next 2 weeks. Return to see me in 2 weeks, we will consider checking uric acid levels at the follow-up.    ____________________________________________ [name]. [name], M.D., ABFM., CAQSM., AME. Primary Care and Sports Medicine [facility]  Adjunct Professor of [place] [employer] [facility] of Medicine  Risk manager

## 2022-05-21 NOTE — Telephone Encounter (Signed)
Called patient to discuss yesterday's visit at [facility]. No answer, unable to leave VM.

## 2022-06-03 ENCOUNTER — Ambulatory Visit (INDEPENDENT_AMBULATORY_CARE_PROVIDER_SITE_OTHER): Payer: Medicare (Managed Care)

## 2022-06-03 ENCOUNTER — Ambulatory Visit: Payer: Medicare (Managed Care) | Admitting: Sports Medicine

## 2022-06-03 DIAGNOSIS — M25532 Pain in left wrist: Secondary | ICD-10-CM | POA: Diagnosis not present

## 2022-06-03 DIAGNOSIS — M1812 Unilateral primary osteoarthritis of first carpometacarpal joint, left hand: Secondary | ICD-10-CM

## 2022-06-03 DIAGNOSIS — M25432 Effusion, left wrist: Secondary | ICD-10-CM

## 2022-06-03 NOTE — Progress Notes (Signed)
    Procedures performed today:    Procedure: Real-time Ultrasound Guided injection of the left first [facility] Device: Samsung HS60  Verbal informed consent obtained.  Time-out conducted.  Noted no overlying erythema, induration, or other signs of local infection.  Skin prepped in a sterile fashion.  Local anesthesia: Topical Ethyl chloride.  With sterile technique and under real time ultrasound guidance: Arthritic joint noted, 1/2 cc lidocaine, 1/2 cc kenalog 40 injected easily. Completed without difficulty  Advised to call if fevers/chills, erythema, induration, drainage, or persistent bleeding.  Images permanently stored and available for review in PACS.  Impression: Technically successful ultrasound guided injection.  Independent interpretation of notes and tests performed by another provider:   None.  Brief History, Exam, Impression, and Recommendations:    Primary osteoarthritis of first carpometacarpal joint of left hand Kelsey Snow returns, she is a very pleasant 56 year old female, I saw her about 2 weeks ago, she had developed pain and swelling vulvar left wrist radial aspect, went to urgent care, x-rays negative, she was given some prednisone and symptoms improved, unfortunately symptoms recurred. Initially we suspected a crystalline process but due to the persistence of discomfort as well as tenderness at the thumb basal joint from a dorsal and volar aspect I do think her pain is coming from the thumb basal joint. I injected this today with ultrasound guidance. I did not see any ganglion cyst on the volar aspect on ultrasound. Return to see me in 6 weeks as needed.    ____________________________________________ [name]. [name], M.D., ABFM., CAQSM., AME. Primary Care and Sports Medicine [facility]  Adjunct Professor of [place] of [facility] of Medicine  Risk manager

## 2022-06-03 NOTE — Assessment & Plan Note (Signed)
Kelsey Snow returns, she is a very pleasant 56 year old female, I saw her about 2 weeks ago, she had developed pain and swelling vulvar left wrist radial aspect, went to urgent care, x-rays negative, she was given some prednisone and symptoms improved, unfortunately symptoms recurred. Initially we suspected a crystalline process but due to the persistence of discomfort as well as tenderness at the thumb basal joint from a dorsal and volar aspect I do think her pain is coming from the thumb basal joint. I injected this today with ultrasound guidance. I did not see any ganglion cyst on the volar aspect on ultrasound. Return to see me in 6 weeks as needed.

## 2022-06-04 DIAGNOSIS — M5416 Radiculopathy, lumbar region: Secondary | ICD-10-CM | POA: Diagnosis not present

## 2022-06-04 DIAGNOSIS — Z79899 Other long term (current) drug therapy: Secondary | ICD-10-CM | POA: Diagnosis not present

## 2022-06-04 DIAGNOSIS — G894 Chronic pain syndrome: Secondary | ICD-10-CM | POA: Diagnosis not present

## 2022-06-04 DIAGNOSIS — M47816 Spondylosis without myelopathy or radiculopathy, lumbar region: Secondary | ICD-10-CM | POA: Diagnosis not present

## 2022-06-04 DIAGNOSIS — M7918 Myalgia, other site: Secondary | ICD-10-CM | POA: Diagnosis not present

## 2022-06-04 DIAGNOSIS — M961 Postlaminectomy syndrome, not elsewhere classified: Secondary | ICD-10-CM | POA: Diagnosis not present

## 2022-06-04 DIAGNOSIS — G8929 Other chronic pain: Secondary | ICD-10-CM | POA: Diagnosis not present

## 2022-06-10 ENCOUNTER — Encounter: Payer: Self-pay | Admitting: General Practice

## 2022-06-21 ENCOUNTER — Other Ambulatory Visit: Payer: Self-pay | Admitting: Sports Medicine

## 2022-06-24 ENCOUNTER — Ambulatory Visit: Payer: Medicare (Managed Care) | Admitting: Sports Medicine

## 2022-06-25 ENCOUNTER — Encounter: Payer: Self-pay | Admitting: Sports Medicine

## 2022-06-25 DIAGNOSIS — M961 Postlaminectomy syndrome, not elsewhere classified: Secondary | ICD-10-CM | POA: Diagnosis not present

## 2022-06-25 DIAGNOSIS — M5416 Radiculopathy, lumbar region: Secondary | ICD-10-CM | POA: Diagnosis not present

## 2022-07-01 ENCOUNTER — Ambulatory Visit: Payer: Medicare (Managed Care) | Admitting: Sports Medicine

## 2022-07-01 ENCOUNTER — Ambulatory Visit (INDEPENDENT_AMBULATORY_CARE_PROVIDER_SITE_OTHER): Payer: Medicare (Managed Care) | Admitting: Sports Medicine

## 2022-07-01 DIAGNOSIS — Z23 Encounter for immunization: Secondary | ICD-10-CM | POA: Diagnosis not present

## 2022-07-09 ENCOUNTER — Other Ambulatory Visit: Payer: Self-pay | Admitting: Sports Medicine

## 2022-07-09 DIAGNOSIS — Z1231 Encounter for screening mammogram for malignant neoplasm of breast: Secondary | ICD-10-CM

## 2022-07-29 ENCOUNTER — Other Ambulatory Visit: Payer: Self-pay | Admitting: Sports Medicine

## 2022-07-29 DIAGNOSIS — E039 Hypothyroidism, unspecified: Secondary | ICD-10-CM

## 2022-07-30 DIAGNOSIS — M961 Postlaminectomy syndrome, not elsewhere classified: Secondary | ICD-10-CM | POA: Diagnosis not present

## 2022-07-30 DIAGNOSIS — M5416 Radiculopathy, lumbar region: Secondary | ICD-10-CM | POA: Diagnosis not present

## 2022-07-30 DIAGNOSIS — M47816 Spondylosis without myelopathy or radiculopathy, lumbar region: Secondary | ICD-10-CM | POA: Diagnosis not present

## 2022-07-30 DIAGNOSIS — G894 Chronic pain syndrome: Secondary | ICD-10-CM | POA: Diagnosis not present

## 2022-07-30 DIAGNOSIS — M7918 Myalgia, other site: Secondary | ICD-10-CM | POA: Diagnosis not present

## 2022-07-30 DIAGNOSIS — Z79899 Other long term (current) drug therapy: Secondary | ICD-10-CM | POA: Diagnosis not present

## 2022-08-05 ENCOUNTER — Encounter (INDEPENDENT_AMBULATORY_CARE_PROVIDER_SITE_OTHER): Payer: Medicare (Managed Care) | Admitting: Sports Medicine

## 2022-08-05 ENCOUNTER — Encounter: Payer: Self-pay | Admitting: Sports Medicine

## 2022-08-05 DIAGNOSIS — F422 Mixed obsessional thoughts and acts: Secondary | ICD-10-CM

## 2022-08-06 NOTE — Telephone Encounter (Signed)
I spent 5 total minutes of online digital evaluation and management services in this patient-initiated request for online care. 

## 2022-08-14 ENCOUNTER — Ambulatory Visit: Payer: Medicare (Managed Care) | Admitting: Sports Medicine

## 2022-08-14 ENCOUNTER — Encounter: Payer: Self-pay | Admitting: Sports Medicine

## 2022-08-14 VITALS — BP 138/75 | HR 72 | Wt 265.0 lb

## 2022-08-14 DIAGNOSIS — L821 Other seborrheic keratosis: Secondary | ICD-10-CM | POA: Diagnosis not present

## 2022-08-14 DIAGNOSIS — I10 Essential (primary) hypertension: Secondary | ICD-10-CM | POA: Diagnosis not present

## 2022-08-14 DIAGNOSIS — E119 Type 2 diabetes mellitus without complications: Secondary | ICD-10-CM

## 2022-08-14 LAB — POCT GLYCOSYLATED HEMOGLOBIN (HGB A1C): Hemoglobin A1C: 6.2 % — AB (ref 4.0–5.6)

## 2022-08-14 MED ORDER — METFORMIN HCL ER 500 MG PO TB24: 500.0000 mg | ORAL_TABLET | Freq: Every day | ORAL | 3 refills | Status: DC

## 2022-08-14 MED ORDER — SEMAGLUTIDE 14 MG PO TABS: 1.0000 | ORAL_TABLET | Freq: Every day | ORAL | 11 refills | Status: DC

## 2022-08-14 MED ORDER — SEMAGLUTIDE 7 MG PO TABS: 1.0000 | ORAL_TABLET | Freq: Every day | ORAL | 0 refills | Status: DC

## 2022-08-14 MED ORDER — RYBELSUS 3 MG PO TABS: 1.0000 | ORAL_TABLET | Freq: Every day | ORAL | 0 refills | Status: DC

## 2022-08-14 NOTE — Assessment & Plan Note (Signed)
Cryotherapy as above. 

## 2022-08-14 NOTE — Assessment & Plan Note (Signed)
Improved considerably on recheck, this will improve further with weight loss.

## 2022-08-14 NOTE — Progress Notes (Signed)
    Procedures performed today:    Procedure:  Cryodestruction of right posterior shoulder seborrheic keratosis Consent obtained and verified. Time-out conducted. Noted no overlying erythema, induration, or other signs of local infection. Completed without difficulty using Cryo-Gun. Advised to call if fevers/chills, erythema, induration, drainage, or persistent bleeding.  Independent interpretation of notes and tests performed by another provider:   None.  Brief History, Exam, Impression, and Recommendations:    Controlled type 2 diabetes mellitus without complication, without long-term current use of insulin (HCC) Diabetes mellitus type 2, starting metformin, Rybelsus, calling in all the doses. She does need to lose a lot of weight. Return to see me in 3 months for repeat A1c and weight check.  Seborrheic keratosis Cryotherapy as above.  Benign essential hypertension Improved considerably on recheck, this will improve further with weight loss.    ____________________________________________ [name]. [name], M.D., ABFM., CAQSM., AME. Primary Care and Sports Medicine [facility]  Adjunct Professor of [place] [employer] [facility] of Medicine  Risk manager

## 2022-08-14 NOTE — Assessment & Plan Note (Signed)
Diabetes mellitus type 2, starting metformin, Rybelsus, calling in all the doses. She does need to lose a lot of weight. Return to see me in 3 months for repeat A1c and weight check.

## 2022-08-17 ENCOUNTER — Encounter (INDEPENDENT_AMBULATORY_CARE_PROVIDER_SITE_OTHER): Payer: Medicare (Managed Care) | Admitting: Sports Medicine

## 2022-08-17 DIAGNOSIS — E119 Type 2 diabetes mellitus without complications: Secondary | ICD-10-CM

## 2022-08-17 DIAGNOSIS — E669 Obesity, unspecified: Secondary | ICD-10-CM | POA: Diagnosis not present

## 2022-08-17 MED ORDER — SEMAGLUTIDE 14 MG PO TABS: 1.0000 | ORAL_TABLET | Freq: Every day | ORAL | 11 refills | Status: DC

## 2022-08-17 MED ORDER — SEMAGLUTIDE 7 MG PO TABS: 1.0000 | ORAL_TABLET | Freq: Every day | ORAL | 0 refills | Status: DC

## 2022-08-17 MED ORDER — RYBELSUS 3 MG PO TABS: 1.0000 | ORAL_TABLET | Freq: Every day | ORAL | 0 refills | Status: DC

## 2022-08-17 NOTE — Telephone Encounter (Signed)
I spent 5 total minutes of online digital evaluation and management services in this patient-initiated request for online care. 

## 2022-08-17 NOTE — Addendum Note (Signed)
Addended by: Narda Rutherford on: 08/17/2022 01:51 PM   Modules accepted: Orders

## 2022-08-17 NOTE — Assessment & Plan Note (Signed)
Patient sent a MyChart message requesting referral to healthy weight and wellness [facility].

## 2022-08-20 DIAGNOSIS — M533 Sacrococcygeal disorders, not elsewhere classified: Secondary | ICD-10-CM | POA: Diagnosis not present

## 2022-08-27 ENCOUNTER — Ambulatory Visit: Payer: Medicare (Managed Care)

## 2022-08-31 ENCOUNTER — Ambulatory Visit: Payer: Medicare (Managed Care) | Admitting: Bariatrics

## 2022-08-31 ENCOUNTER — Encounter: Payer: Self-pay | Admitting: Bariatrics

## 2022-08-31 VITALS — BP 169/87 | HR 64 | Temp 97.8°F | Ht 64.0 in | Wt 260.0 lb

## 2022-08-31 DIAGNOSIS — E1169 Type 2 diabetes mellitus with other specified complication: Secondary | ICD-10-CM | POA: Diagnosis not present

## 2022-08-31 DIAGNOSIS — E7800 Pure hypercholesterolemia, unspecified: Secondary | ICD-10-CM

## 2022-08-31 DIAGNOSIS — E65 Localized adiposity: Secondary | ICD-10-CM

## 2022-08-31 DIAGNOSIS — I10 Essential (primary) hypertension: Secondary | ICD-10-CM | POA: Diagnosis not present

## 2022-08-31 DIAGNOSIS — E668 Other obesity: Secondary | ICD-10-CM

## 2022-08-31 DIAGNOSIS — Z6841 Body Mass Index (BMI) 40.0 and over, adult: Secondary | ICD-10-CM

## 2022-08-31 NOTE — Progress Notes (Signed)
Office: [phone]  /  Fax: [phone]   Initial Visit  Kelsey Snow was seen in clinic today to evaluate for obesity. She is interested in losing weight to improve overall health and reduce the risk of weight related complications. She presents today to review program treatment options, initial physical assessment, and evaluation.     She was referred by: Specialist ( Sports Medicine ).   When asked what else they would like to accomplish? She states: Improve existing medical conditions and Improve quality of life  When asked how has your weight affected you? She states: Contributed to medical problems and Having fatigue  Some associated conditions: Hypertension, Hyperlipidemia, and Diabetes  Contributing factors: Family history (mother) and Menopause  Weight promoting medications identified: None  Current nutrition plan: Portion control / smart choices and Other: change her snacks and decrease ETOH  Current level of physical activity: Other: Working out at [employer] 3 days/week.  Current or previous pharmacotherapy: Phentermine  Response to medication: Lost weight initially but was unable to sustain weight loss   Past medical history includes:   Past Medical History:  Diagnosis Date   Anemia    Anxiety    Arthritis    Carpal tunnel syndrome    Depression    Essential hypertension, benign 03/07/2015   GERD (gastroesophageal reflux disease)    History of degenerative disc disease    Hx of ectopic pregnancy    Hyperlipidemia    Lumbosacral spondylolysis    Obese    OCD (obsessive compulsive disorder)    Thyroid disease      Objective:   BP (!) 169/87   Pulse 64   Temp 97.8 F (36.6 C)   Ht '5\' 4"'$  (1.626 m)   Wt 260 lb (117.9 kg)   LMP 02/26/2014   SpO2 100%   BMI 44.63 kg/m  She was weighed on the bioimpedance scale: Body mass index is 44.63 kg/m. Visceral Fat Rating:17, Body Fat%:51.1.  General:  Alert, oriented and cooperative. Patient is in no acute  distress.  Respiratory: Normal respiratory effort, no problems with respiration noted  Extremities: Normal range of motion.    Mental Status: Normal mood and affect. Normal behavior. Normal judgment and thought content.   Assessment and Plan:  1. Visceral obesity Evola's visceral fat rating is 17.  Plan: Her goal is 13 or below for visceral fat rating.  2. Benign essential hypertension Mahira's blood pressure is elevated today.  This may be " whitecoat hypertension".   Plan: She will check her blood pressure at home and will call her PCP if it remains elevated.  3. Diabetes mellitus type 2 in obese (HCC) Suetta is taking Rybelsus (she recently started).   Plan: She will continue Rybelsus, and we will increase her dose as appropriate.   4. Hypercholesterolemia Sianni is taking Crestor, and she denies myalgias.  Plan: Geneal will continue taking Crestor.   5. Obesity, Current BMI 44.8  Patient will return to the office and in 2 to 3 weeks.  She will keep her water intake high.   We reviewed weight, biometrics, associated medical conditions and contributing factors with patient. She would benefit from weight loss therapy via a modified calorie, low-carb, high-protein nutritional plan tailored to their REE (resting energy expenditure) which will be determined by indirect calorimetry.  We will also assess for cardiometabolic risk and nutritional derangements via fasting serologies at her next appointment.      Obesity Treatment / Action Plan:  Patient will  work on garnering support from family and friends to begin weight loss journey. Will work on eliminating or reducing the presence of highly palatable, calorie dense foods in the home. Will complete provided nutritional and psychosocial assessment questionnaire before the next appointment. Will be scheduled for indirect calorimetry to determine resting energy expenditure in a fasting state.  This will allow [place] to create a reduced  calorie, high-protein meal plan to promote loss of fat mass while preserving muscle mass.  Obesity Education Performed Today:  She was weighed on the bioimpedance scale and results were discussed and documented in the synopsis.  We discussed obesity as a disease and the importance of a more detailed evaluation of all the factors contributing to the disease.  We discussed the importance of long term lifestyle changes which include nutrition, exercise and behavioral modifications as well as the importance of customizing this to her specific health and social needs.  We discussed the benefits of reaching a healthier weight to alleviate the symptoms of existing conditions and reduce the risks of the biomechanical, metabolic and psychological effects of obesity.  Kelsey Snow appears to be in the action stage of change and states they are ready to start intensive lifestyle modifications and behavioral modifications.  30 minutes was spent today on this visit including the above counseling, pre-visit chart review, and post-visit documentation.  Reviewed by clinician on day of visit: allergies, medications, problem list, medical history, surgical history, family history, social history, and previous encounter notes.   Wilhemena Durie, am acting as transcriptionist for [employer], DO   I have reviewed the above documentation for accuracy and completeness, and I agree with the above. Jearld Lesch, DO

## 2022-09-02 ENCOUNTER — Ambulatory Visit (INDEPENDENT_AMBULATORY_CARE_PROVIDER_SITE_OTHER): Payer: Medicare (Managed Care)

## 2022-09-02 DIAGNOSIS — Z1231 Encounter for screening mammogram for malignant neoplasm of breast: Secondary | ICD-10-CM

## 2022-09-14 ENCOUNTER — Encounter: Payer: Self-pay | Admitting: Bariatrics

## 2022-09-15 ENCOUNTER — Encounter (INDEPENDENT_AMBULATORY_CARE_PROVIDER_SITE_OTHER): Payer: Medicare (Managed Care) | Admitting: Sports Medicine

## 2022-09-15 ENCOUNTER — Encounter: Payer: Self-pay | Admitting: Sports Medicine

## 2022-09-15 DIAGNOSIS — E119 Type 2 diabetes mellitus without complications: Secondary | ICD-10-CM

## 2022-09-15 DIAGNOSIS — Z7984 Long term (current) use of oral hypoglycemic drugs: Secondary | ICD-10-CM

## 2022-09-15 MED ORDER — RYBELSUS 3 MG PO TABS
1.0000 | ORAL_TABLET | Freq: Every day | ORAL | 11 refills | Status: DC
Start: 2022-09-15 — End: 2022-09-15

## 2022-09-15 MED ORDER — RYBELSUS 3 MG PO TABS: 1.0000 | ORAL_TABLET | Freq: Every day | ORAL | 11 refills | Status: DC

## 2022-09-15 NOTE — Telephone Encounter (Signed)
I spent 5 total minutes of online digital evaluation and management services in this patient-initiated request for online care. 

## 2022-09-15 NOTE — Addendum Note (Signed)
Addended by: Silverio Decamp on: 09/15/2022 02:43 PM   Modules accepted: Orders

## 2022-09-16 ENCOUNTER — Encounter: Payer: Self-pay | Admitting: Bariatrics

## 2022-09-16 ENCOUNTER — Ambulatory Visit (INDEPENDENT_AMBULATORY_CARE_PROVIDER_SITE_OTHER): Payer: Medicare (Managed Care) | Admitting: Bariatrics

## 2022-09-16 VITALS — BP 161/85 | HR 61 | Temp 97.9°F | Ht 64.0 in | Wt 260.0 lb

## 2022-09-16 DIAGNOSIS — R7309 Other abnormal glucose: Secondary | ICD-10-CM | POA: Insufficient documentation

## 2022-09-16 DIAGNOSIS — R0602 Shortness of breath: Secondary | ICD-10-CM | POA: Diagnosis not present

## 2022-09-16 DIAGNOSIS — D508 Other iron deficiency anemias: Secondary | ICD-10-CM | POA: Diagnosis not present

## 2022-09-16 DIAGNOSIS — Z1331 Encounter for screening for depression: Secondary | ICD-10-CM

## 2022-09-16 DIAGNOSIS — R5383 Other fatigue: Secondary | ICD-10-CM

## 2022-09-16 DIAGNOSIS — D649 Anemia, unspecified: Secondary | ICD-10-CM | POA: Insufficient documentation

## 2022-09-16 DIAGNOSIS — E669 Obesity, unspecified: Secondary | ICD-10-CM

## 2022-09-16 DIAGNOSIS — Z0289 Encounter for other administrative examinations: Secondary | ICD-10-CM

## 2022-09-16 DIAGNOSIS — Z0000 Encounter for general adult medical examination without abnormal findings: Secondary | ICD-10-CM | POA: Insufficient documentation

## 2022-09-16 DIAGNOSIS — E559 Vitamin D deficiency, unspecified: Secondary | ICD-10-CM | POA: Diagnosis not present

## 2022-09-16 DIAGNOSIS — E538 Deficiency of other specified B group vitamins: Secondary | ICD-10-CM

## 2022-09-16 DIAGNOSIS — Z6841 Body Mass Index (BMI) 40.0 and over, adult: Secondary | ICD-10-CM | POA: Diagnosis not present

## 2022-09-17 ENCOUNTER — Encounter: Payer: Self-pay | Admitting: Sports Medicine

## 2022-09-17 LAB — TSH+T4F+T3FREE
Free T4: 1.56 ng/dL (ref 0.82–1.77)
T3, Free: 3 pg/mL (ref 2.0–4.4)
TSH: 1.45 u[IU]/mL (ref 0.450–4.500)

## 2022-09-17 LAB — COMPREHENSIVE METABOLIC PANEL
ALT: 20 IU/L (ref 0–32)
AST: 21 IU/L (ref 0–40)
Albumin/Globulin Ratio: 1.7 (ref 1.2–2.2)
Albumin: 4.5 g/dL (ref 3.8–4.9)
Alkaline Phosphatase: 86 IU/L (ref 44–121)
BUN/Creatinine Ratio: 14 (ref 9–23)
BUN: 10 mg/dL (ref 6–24)
Bilirubin Total: 0.3 mg/dL (ref 0.0–1.2)
CO2: 24 mmol/L (ref 20–29)
Calcium: 10 mg/dL (ref 8.7–10.2)
Chloride: 103 mmol/L (ref 96–106)
Creatinine, Ser: 0.71 mg/dL (ref 0.57–1.00)
Globulin, Total: 2.6 g/dL (ref 1.5–4.5)
Glucose: 97 mg/dL (ref 70–99)
Potassium: 4.9 mmol/L (ref 3.5–5.2)
Sodium: 141 mmol/L (ref 134–144)
Total Protein: 7.1 g/dL (ref 6.0–8.5)
eGFR: 100 mL/min/{1.73_m2} (ref >59)

## 2022-09-17 LAB — ANEMIA PANEL
Ferritin: 22 ng/mL (ref 15–150)
Folate, Hemolysate: 403 ng/mL
Folate, RBC: 976 ng/mL (ref >498)
Hematocrit: 41.3 % (ref 34.0–46.6)
Iron Saturation: 17 % (ref 15–55)
Iron: 78 ug/dL (ref 27–159)
Retic Ct Pct: 1.3 % (ref 0.6–2.6)
Total Iron Binding Capacity: 446 ug/dL (ref 250–450)
UIBC: 368 ug/dL (ref 131–425)
Vitamin B-12: 848 pg/mL (ref 232–1245)

## 2022-09-17 LAB — INSULIN, RANDOM: INSULIN: 10.5 u[IU]/mL (ref 2.6–24.9)

## 2022-09-17 LAB — HEMOGLOBIN A1C
Est. average glucose Bld gHb Est-mCnc: 131 mg/dL
Hgb A1c MFr Bld: 6.2 % — ABNORMAL HIGH (ref 4.8–5.6)

## 2022-09-17 LAB — VITAMIN D 25 HYDROXY (VIT D DEFICIENCY, FRACTURES): Vit D, 25-Hydroxy: 26.2 ng/mL — ABNORMAL LOW (ref 30.0–100.0)

## 2022-09-30 ENCOUNTER — Ambulatory Visit: Payer: Medicare (Managed Care) | Admitting: Bariatrics

## 2022-09-30 ENCOUNTER — Encounter: Payer: Self-pay | Admitting: Bariatrics

## 2022-09-30 ENCOUNTER — Ambulatory Visit (INDEPENDENT_AMBULATORY_CARE_PROVIDER_SITE_OTHER): Payer: Medicare (Managed Care) | Admitting: Bariatrics

## 2022-09-30 VITALS — BP 162/90 | HR 74 | Temp 97.5°F | Ht 64.0 in | Wt 257.0 lb

## 2022-09-30 DIAGNOSIS — Z6841 Body Mass Index (BMI) 40.0 and over, adult: Secondary | ICD-10-CM | POA: Diagnosis not present

## 2022-09-30 DIAGNOSIS — E669 Obesity, unspecified: Secondary | ICD-10-CM | POA: Diagnosis not present

## 2022-09-30 DIAGNOSIS — R7303 Prediabetes: Secondary | ICD-10-CM | POA: Insufficient documentation

## 2022-09-30 DIAGNOSIS — E559 Vitamin D deficiency, unspecified: Secondary | ICD-10-CM | POA: Diagnosis not present

## 2022-09-30 MED ORDER — VITAMIN D (ERGOCALCIFEROL) 1.25 MG (50000 UNIT) PO CAPS: 50000.0000 [IU] | ORAL_CAPSULE | ORAL | 0 refills | Status: DC

## 2022-09-30 NOTE — Progress Notes (Signed)
Chief Complaint:   OBESITY Kelsey Snow (MR# [id number]) is a 56 y.o. female who presents for evaluation and treatment of obesity and related comorbidities. Current BMI is Body mass index is 44.63 kg/m. Chey has been struggling with her weight for many years and has been unsuccessful in either losing weight, maintaining weight loss, or reaching her healthy weight goal.  Faron is currently in the action stage of change and ready to dedicate time achieving and maintaining a healthier weight. Deundra is interested in becoming our patient and working on intensive lifestyle modifications including (but not limited to) diet and exercise for weight loss.  Jarissa is here for her initial visit. She struggles with skipping meals. She does like to cook, but she notes prep time as an obstacle.   Tien's habits were reviewed today and are as follows: Her family eats meals together, she thinks her family will eat healthier with her, her desired weight loss is 40 lbs, she started gaining weight in 2010-2011, her heaviest weight ever was 285 pounds, she has significant food cravings issues, she skips meals frequently, she is trying to follow a vegetarian diet, she is trying to follow a vegan diet, she is frequently drinking liquids with calories, she frequently eats larger portions than normal, and she struggles with emotional eating.  Depression Screen Quinetta's Food and Mood (modified PHQ-9) score was 9.  Subjective:   1. Other fatigue Brigitt denies daytime somnolence and admits to waking up still tired. Patient has a history of symptoms of morning fatigue. Harlean generally gets 7 or 8 hours of sleep per night, and states that she has generally restful sleep. Snoring is present. Apneic episodes are not present. Epworth Sleepiness Score is 0.   2. SOB (shortness of breath) on exertion Fareeha notes increasing shortness of breath with exercising and seems to be worsening over time with weight gain. She notes  getting out of breath sooner with activity than she used to. This has not gotten worse recently. Bruchy denies shortness of breath at rest or orthopnea.  3. Elevated glucose Madelyne is taking Rybelsus currently.   4. B12 deficiency Cala is not taking B12 supplementation at this time.   5. Vitamin D deficiency Laurell is not taking Vitamin D supplementation at this time.   6. Health care maintenance Given obesity.  7. Other iron deficiency anemia Adaira is not on iron supplementation currently.   Assessment/Plan:   1. Other fatigue Jayelle does feel that her weight is causing her energy to be lower than it should be. Fatigue may be related to obesity, depression or many other causes. Labs will be ordered, and in the meanwhile, Bethany will focus on self care including making healthy food choices, increasing physical activity and focusing on stress reduction.  - EKG 12-Lead - Hemoglobin A1c - Insulin, random - TSH+T4F+T3Free  2. SOB (shortness of breath) on exertion Fatiha does feel that she gets out of breath more easily that she used to when she exercises. Vernis's shortness of breath appears to be obesity related and exercise induced. She has agreed to work on weight loss and gradually increase exercise to treat her exercise induced shortness of breath. Will continue to monitor closely.  - TSH+T4F+T3Free  3. Elevated glucose We will check labs today, and we will follow-up at Shaleah's next visit.   - Hemoglobin A1c - Insulin, random  4. B12 deficiency We will check labs today, and we will follow-up at Kathrynne's next visit.  5. Vitamin D deficiency We will check labs today, and we will follow-up at Latoi's next visit.   - VITAMIN D 25 Hydroxy (Vit-D Deficiency, Fractures)  6. Health care maintenance We will check labs today. EKG and IC were done today, and results were reviewed with the patient.   - Comprehensive metabolic panel - Hemoglobin A1c - Insulin, random - Anemia  panel  7. Other iron deficiency anemia We will check labs today, and we will follow-up at Latroya's next visit.   - Anemia panel  8. Depression screening Inice had a positive depression screening. Depression is commonly associated with obesity and often results in emotional eating behaviors. We will monitor this closely and work on CBT to help improve the non-hunger eating patterns. Referral to Psychology may be required if no improvement is seen as she continues in our clinic.  9. Obesity, Current BMI 44.7 Serinity is currently in the action stage of change and her goal is to continue with weight loss efforts. I recommend Jalani begin the structured treatment plan as follows:  She has agreed to the Category 3 Plan.  Protein shake handout was given.   Exercise goals: Gym 3 times per week.    Behavioral modification strategies: increasing lean protein intake, decreasing simple carbohydrates, increasing vegetables, increasing water intake, decreasing eating out, no skipping meals, meal planning and cooking strategies, keeping healthy foods in the home, and planning for success.  She was informed of the importance of frequent follow-up visits to maximize her success with intensive lifestyle modifications for her multiple health conditions. She was informed we would discuss her lab results at her next visit unless there is a critical issue that needs to be addressed sooner. Trannie agreed to keep her next visit at the agreed upon time to discuss these results.  Objective:   Blood pressure (!) 161/85, pulse 61, temperature 97.9 F (36.6 C), height '5\' 4"'$  (1.626 m), weight 260 lb (117.9 kg), last menstrual period 02/26/2014, SpO2 97 %. Body mass index is 44.63 kg/m.  EKG: Normal sinus rhythm, rate 64 BPM.  Indirect Calorimeter completed today shows a VO2 of 287 and a REE of 1987.  Her calculated basal metabolic rate is 4235 thus her basal metabolic rate is better than expected.  General:  Cooperative, alert, well developed, in no acute distress. HEENT: Conjunctivae and lids unremarkable. Cardiovascular: Regular rhythm.  Lungs: Normal work of breathing. Neurologic: No focal deficits.   Lab Results  Component Value Date   CREATININE 0.71 09/16/2022   BUN 10 09/16/2022   NA 141 09/16/2022   K 4.9 09/16/2022   CL 103 09/16/2022   CO2 24 09/16/2022   Lab Results  Component Value Date   ALT 20 09/16/2022   AST 21 09/16/2022   ALKPHOS 86 09/16/2022   BILITOT 0.3 09/16/2022   Lab Results  Component Value Date   HGBA1C 6.2 (H) 09/16/2022   HGBA1C 6.2 (A) 08/14/2022   HGBA1C 6.0 (H) 05/07/2022   HGBA1C 6.5 (H) 12/23/2021   HGBA1C 6.0 (H) 05/09/2020   Lab Results  Component Value Date   INSULIN 10.5 09/16/2022   Lab Results  Component Value Date   TSH 1.450 09/16/2022   Lab Results  Component Value Date   CHOL 168 12/23/2021   HDL 60 12/23/2021   LDLCALC 89 12/23/2021   TRIG 93 12/23/2021   CHOLHDL 2.8 12/23/2021   Lab Results  Component Value Date   WBC 4.9 12/23/2021   HGB 12.7 12/23/2021   HCT  41.3 09/16/2022   MCV 88.1 12/23/2021   PLT 322 12/23/2021   Lab Results  Component Value Date   IRON 78 09/16/2022   TIBC 446 09/16/2022   FERRITIN 22 09/16/2022   Attestation Statements:   Reviewed by clinician on day of visit: allergies, medications, problem list, medical history, surgical history, family history, social history, and previous encounter notes.   Wilhemena Durie, am acting as Location manager for [employer], DO.  I have reviewed the above documentation for accuracy and completeness, and I agree with the above. Jearld Lesch, DO

## 2022-10-13 NOTE — Progress Notes (Signed)
Chief Complaint:   OBESITY Kelsey Snow is here to discuss her progress with her obesity treatment plan along with follow-up of her obesity related diagnoses. Kelsey Snow is on the Category 3 Plan and states she is following her eating plan approximately 10% of the time. Kelsey Snow states she is at the gym for 60 minutes 3 times per week.  Today's visit was #: 2 Starting weight: 260 lbs Starting date: 09/16/2022 Today's weight: 257 lbs Today's date: 09/30/2022 Total lbs lost to date: 3 Total lbs lost since last in-office visit: 3  Interim History: [name] is down 3 pounds since her last visit.  She is on Rybelsus.  She is making better choices.  Subjective:   1. Vitamin D deficiency [name] is not taking vitamin D, and her previous vitamin D level was 26.2.  2. Prediabetes Kelsey Snow's recent A1c was 6.2 and insulin 10.5.  Assessment/Plan:   1. Vitamin D deficiency Kelsey Snow agreed to start prescription vitamin D 50,000 IU every week, with no refills.  - Vitamin D, Ergocalciferol, (DRISDOL) 1.25 MG (50000 UNIT) CAPS capsule; Take 1 capsule (50,000 Units total) by mouth every 7 (seven) days.  Dispense: 5 capsule; Refill: 0  2. Prediabetes Kelsey Snow will continue taking Rybelsus, and she will work on eating healthy snacks.  3. Obesity, Current BMI 44.1 Kelsey Snow is currently in the action stage of change. As such, her goal is to continue with weight loss efforts. She has agreed to the Category 3 Plan.   Meal planning was discussed.  She will work on decreasing carbohydrates and eating out handout was given.  Reviewed labs with the patient from 09/16/2022, CMP, iron, vitamin D, B12, A1c, thyroid panel, and insulin.  Exercise goals: As is.   Behavioral modification strategies: increasing lean protein intake, decreasing simple carbohydrates, increasing vegetables, increasing water intake, decreasing eating out, no skipping meals, meal planning and cooking strategies, keeping healthy foods in the home, and  planning for success.  Kelsey Snow has agreed to follow-up with our clinic in 2 weeks. She was informed of the importance of frequent follow-up visits to maximize her success with intensive lifestyle modifications for her multiple health conditions.   Objective:   Blood pressure (!) 162/90, pulse 74, temperature (!) 97.5 F (36.4 C), height '5\' 4"'$  (1.626 m), weight 257 lb (116.6 kg), last menstrual period 02/26/2014, SpO2 99 %. Body mass index is 44.11 kg/m.  General: Cooperative, alert, well developed, in no acute distress. HEENT: Conjunctivae and lids unremarkable. Cardiovascular: Regular rhythm.  Lungs: Normal work of breathing. Neurologic: No focal deficits.   Lab Results  Component Value Date   CREATININE 0.71 09/16/2022   BUN 10 09/16/2022   NA 141 09/16/2022   K 4.9 09/16/2022   CL 103 09/16/2022   CO2 24 09/16/2022   Lab Results  Component Value Date   ALT 20 09/16/2022   AST 21 09/16/2022   ALKPHOS 86 09/16/2022   BILITOT 0.3 09/16/2022   Lab Results  Component Value Date   HGBA1C 6.2 (H) 09/16/2022   HGBA1C 6.2 (A) 08/14/2022   HGBA1C 6.0 (H) 05/07/2022   HGBA1C 6.5 (H) 12/23/2021   HGBA1C 6.0 (H) 05/09/2020   Lab Results  Component Value Date   INSULIN 10.5 09/16/2022   Lab Results  Component Value Date   TSH 1.450 09/16/2022   Lab Results  Component Value Date   CHOL 168 12/23/2021   HDL 60 12/23/2021   LDLCALC 89 12/23/2021   TRIG 93 12/23/2021   CHOLHDL 2.8  12/23/2021   Lab Results  Component Value Date   VD25OH 26.2 (L) 09/16/2022   VD25OH 45 05/09/2020   VD25OH 53 04/03/2016   Lab Results  Component Value Date   WBC 4.9 12/23/2021   HGB 12.7 12/23/2021   HCT 41.3 09/16/2022   MCV 88.1 12/23/2021   PLT 322 12/23/2021   Lab Results  Component Value Date   IRON 78 09/16/2022   TIBC 446 09/16/2022   FERRITIN 22 09/16/2022   Attestation Statements:   Reviewed by clinician on day of visit: allergies, medications, problem list, medical  history, surgical history, family history, social history, and previous encounter notes.   Kelsey Snow, am acting as Location manager for [employer], DO.  I have reviewed the above documentation for accuracy and completeness, and I agree with the above. Kelsey Lesch, DO

## 2022-10-14 ENCOUNTER — Encounter: Payer: Self-pay | Admitting: Bariatrics

## 2022-10-14 ENCOUNTER — Ambulatory Visit: Payer: Medicare (Managed Care) | Admitting: Bariatrics

## 2022-10-14 VITALS — BP 170/106 | HR 69 | Temp 97.8°F | Ht 64.0 in | Wt 259.0 lb

## 2022-10-14 DIAGNOSIS — I10 Essential (primary) hypertension: Secondary | ICD-10-CM | POA: Diagnosis not present

## 2022-10-14 DIAGNOSIS — Z6841 Body Mass Index (BMI) 40.0 and over, adult: Secondary | ICD-10-CM

## 2022-10-14 DIAGNOSIS — E559 Vitamin D deficiency, unspecified: Secondary | ICD-10-CM

## 2022-10-14 DIAGNOSIS — E669 Obesity, unspecified: Secondary | ICD-10-CM

## 2022-10-14 MED ORDER — VITAMIN D (ERGOCALCIFEROL) 1.25 MG (50000 UNIT) PO CAPS: 50000.0000 [IU] | ORAL_CAPSULE | ORAL | 0 refills | Status: DC

## 2022-10-14 MED ORDER — VALSARTAN-HYDROCHLOROTHIAZIDE 80-12.5 MG PO TABS: 1.0000 | ORAL_TABLET | Freq: Every day | ORAL | 0 refills | Status: DC

## 2022-10-16 DIAGNOSIS — M7918 Myalgia, other site: Secondary | ICD-10-CM | POA: Diagnosis not present

## 2022-10-16 DIAGNOSIS — Z79899 Other long term (current) drug therapy: Secondary | ICD-10-CM | POA: Diagnosis not present

## 2022-10-16 DIAGNOSIS — M5416 Radiculopathy, lumbar region: Secondary | ICD-10-CM | POA: Diagnosis not present

## 2022-10-16 DIAGNOSIS — G894 Chronic pain syndrome: Secondary | ICD-10-CM | POA: Diagnosis not present

## 2022-10-16 DIAGNOSIS — M47816 Spondylosis without myelopathy or radiculopathy, lumbar region: Secondary | ICD-10-CM | POA: Diagnosis not present

## 2022-10-16 DIAGNOSIS — M961 Postlaminectomy syndrome, not elsewhere classified: Secondary | ICD-10-CM | POA: Diagnosis not present

## 2022-10-21 ENCOUNTER — Encounter: Payer: Self-pay | Admitting: Sports Medicine

## 2022-10-21 ENCOUNTER — Ambulatory Visit: Payer: Medicare (Managed Care) | Admitting: Bariatrics

## 2022-10-21 ENCOUNTER — Other Ambulatory Visit: Payer: Self-pay | Admitting: Sports Medicine

## 2022-10-28 ENCOUNTER — Ambulatory Visit (INDEPENDENT_AMBULATORY_CARE_PROVIDER_SITE_OTHER): Payer: Medicare HMO | Admitting: Bariatrics

## 2022-10-28 ENCOUNTER — Encounter: Payer: Self-pay | Admitting: Bariatrics

## 2022-10-28 ENCOUNTER — Ambulatory Visit: Payer: Medicare (Managed Care) | Admitting: Bariatrics

## 2022-10-28 VITALS — BP 132/81 | HR 78 | Temp 98.3°F | Ht 64.0 in | Wt 256.0 lb

## 2022-10-28 DIAGNOSIS — E559 Vitamin D deficiency, unspecified: Secondary | ICD-10-CM | POA: Diagnosis not present

## 2022-10-28 DIAGNOSIS — Z6841 Body Mass Index (BMI) 40.0 and over, adult: Secondary | ICD-10-CM | POA: Diagnosis not present

## 2022-10-28 DIAGNOSIS — E669 Obesity, unspecified: Secondary | ICD-10-CM | POA: Diagnosis not present

## 2022-10-28 DIAGNOSIS — I10 Essential (primary) hypertension: Secondary | ICD-10-CM

## 2022-10-28 MED ORDER — VITAMIN D (ERGOCALCIFEROL) 1.25 MG (50000 UNIT) PO CAPS: 50000.0000 [IU] | ORAL_CAPSULE | ORAL | 0 refills | Status: DC

## 2022-10-31 NOTE — Progress Notes (Unsigned)
Chief Complaint:   OBESITY Kelsey Snow is here to discuss her progress with her obesity treatment plan along with follow-up of her obesity related diagnoses. Jadwiga is on the Category 3 Plan and states she is following her eating plan approximately 20% of the time. Anaka states she is doing 0 minutes 0 times per week.  Today's visit was #: 3 Starting weight: 260 lbs Starting date: 09/16/2022 Today's weight: 259 lbs Today's date: 10/14/2022 Total lbs lost to date: 1 Total lbs lost since last in-office visit: 0  Interim History: Dayani is up 2 lbs over the holidays.   Subjective:   1. Vitamin D deficiency Lesli is taking Vitamin D as directed. She notes her energy level is better.   2. Benign essential hypertension Reine had ham and other salty. Her last blood pressure is 162/90, down from 170/106. She is not on medications currently.   Assessment/Plan:   1. Vitamin D deficiency Jazaria will continue prescription Vitamin D 50,000 IU once weekly #5, and we will refill for 1 month.   2. Benign essential hypertension We will refill valsartan-hydrochlorothiazide for 90 days. We reviewed allergies. She will check her blood pressure at home and will check it in the morning. She has a cuff at home. She will working eliminating ham and decreasing salt.   - valsartan-hydrochlorothiazide (DIOVAN-HCT) 80-12.5 MG tablet; Take 1 tablet by mouth daily.  Dispense: 90 tablet; Refill: 0  3. Obesity, Current BMI 44.6 Kelsey Snow is currently in the action stage of change. As such, her goal is to continue with weight loss efforts. She has agreed to the Category 3 Plan.   Meal planning and intentional eating were discussed.   Exercise goals: No exercise has been prescribed at this time.  Behavioral modification strategies: increasing lean protein intake, decreasing simple carbohydrates, increasing vegetables, increasing water intake, decreasing eating out, no skipping meals, meal planning and cooking  strategies, keeping healthy foods in the home, and planning for success.  Kelsey Snow has agreed to follow-up with our clinic in 2 weeks. She was informed of the importance of frequent follow-up visits to maximize her success with intensive lifestyle modifications for her multiple health conditions.   Objective:   Blood pressure (!) 170/106, pulse 69, temperature 97.8 F (36.6 C), height '5\' 4"'$  (1.626 m), weight 259 lb (117.5 kg), last menstrual period 02/26/2014, SpO2 99 %. Body mass index is 44.46 kg/m.  General: Cooperative, alert, well developed, in no acute distress. HEENT: Conjunctivae and lids unremarkable. Cardiovascular: Regular rhythm.  Lungs: Normal work of breathing. Neurologic: No focal deficits.   Lab Results  Component Value Date   CREATININE 0.71 09/16/2022   BUN 10 09/16/2022   NA 141 09/16/2022   K 4.9 09/16/2022   CL 103 09/16/2022   CO2 24 09/16/2022   Lab Results  Component Value Date   ALT 20 09/16/2022   AST 21 09/16/2022   ALKPHOS 86 09/16/2022   BILITOT 0.3 09/16/2022   Lab Results  Component Value Date   HGBA1C 6.2 (H) 09/16/2022   HGBA1C 6.2 (A) 08/14/2022   HGBA1C 6.0 (H) 05/07/2022   HGBA1C 6.5 (H) 12/23/2021   HGBA1C 6.0 (H) 05/09/2020   Lab Results  Component Value Date   INSULIN 10.5 09/16/2022   Lab Results  Component Value Date   TSH 1.450 09/16/2022   Lab Results  Component Value Date   CHOL 168 12/23/2021   HDL 60 12/23/2021   LDLCALC 89 12/23/2021   TRIG 93 12/23/2021  CHOLHDL 2.8 12/23/2021   Lab Results  Component Value Date   VD25OH 26.2 (L) 09/16/2022   VD25OH 45 05/09/2020   VD25OH 53 04/03/2016   Lab Results  Component Value Date   WBC 4.9 12/23/2021   HGB 12.7 12/23/2021   HCT 41.3 09/16/2022   MCV 88.1 12/23/2021   PLT 322 12/23/2021   Lab Results  Component Value Date   IRON 78 09/16/2022   TIBC 446 09/16/2022   FERRITIN 22 09/16/2022   Attestation Statements:   Reviewed by clinician on day of  visit: allergies, medications, problem list, medical history, surgical history, family history, social history, and previous encounter notes.   Wilhemena Durie, am acting as Location manager for [employer], DO.  I have reviewed the above documentation for accuracy and completeness, and I agree with the above. Jearld Lesch, DO

## 2022-11-02 ENCOUNTER — Encounter: Payer: Self-pay | Admitting: Bariatrics

## 2022-11-08 NOTE — Progress Notes (Signed)
Chief Complaint:   OBESITY Kelsey Snow is here to discuss her progress with her obesity treatment plan along with follow-up of her obesity related diagnoses. Kelsey Snow is on the Category 3 Plan and states she is following her eating plan approximately 80% of the time. Kelsey Snow states she is doing cardio and weight training 60 minutes 3 times per week.  Today's visit was #: 4 Starting weight: 260 lbs Starting date: 09/16/2022 Today's weight: 256 lbs Today's date: 10/28/2022 Total lbs lost to date: 4 Total lbs lost since last in-office visit: 3  Interim History: Kelsey Snow started her BP meds at her last visit. She is down 3 lbs since her last visit. She is following the plan closely.  Subjective:   1. Vitamin D deficiency Kelsey Snow is taking prescription Vitamin D as directed.  2. Benign essential hypertension Last BP 170/106 BP today 132/81 Kelsey Snow started Diovan/HCT at last visit.  Assessment/Plan:   1. Vitamin D deficiency Low Vitamin D level contributes to fatigue and are associated with obesity, breast, and colon cancer. She agrees to continue to take prescription Vitamin D 50,000 IU every week and will follow-up for routine testing of Vitamin D, at least 2-3 times per year to avoid over-replacement.  Refill- Vitamin D, Ergocalciferol, (DRISDOL) 1.25 MG (50000 UNIT) CAPS capsule; Take 1 capsule (50,000 Units total) by mouth every 7 (seven) days.  Dispense: 5 capsule; Refill: 0  2. Benign essential hypertension Kelsey Snow will continue her medication. No added salt. Increase water intake. Exercise. More nitric vegetables. Will try [employer] salt.   3. Obesity, Current BMI 44.0 Kelsey Snow is currently in the action stage of change. As such, her goal is to continue with weight loss efforts. She has agreed to the Category 3 Plan.   Meal plan. More vegetables/beets, arugula, greens, butter lettuce, nitric vegetables, hibiscus. Continue to adhere to plan.  Exercise goals:  As  is  Behavioral modification strategies: increasing lean protein intake, decreasing simple carbohydrates, increasing vegetables, increasing water intake, decreasing eating out, no skipping meals, meal planning and cooking strategies, keeping healthy foods in the home, and planning for success.  Kelsey Snow has agreed to follow-up with our clinic in 3 weeks. She was informed of the importance of frequent follow-up visits to maximize her success with intensive lifestyle modifications for her multiple health conditions.   Objective:   Blood pressure 132/81, pulse 78, temperature 98.3 F (36.8 C), height '5\' 4"'$  (1.626 m), weight 256 lb (116.1 kg), last menstrual period 02/26/2014, SpO2 99 %. Body mass index is 43.94 kg/m.  General: Cooperative, alert, well developed, in no acute distress. HEENT: Conjunctivae and lids unremarkable. Cardiovascular: Regular rhythm.  Lungs: Normal work of breathing. Neurologic: No focal deficits.   Lab Results  Component Value Date   CREATININE 0.71 09/16/2022   BUN 10 09/16/2022   NA 141 09/16/2022   K 4.9 09/16/2022   CL 103 09/16/2022   CO2 24 09/16/2022   Lab Results  Component Value Date   ALT 20 09/16/2022   AST 21 09/16/2022   ALKPHOS 86 09/16/2022   BILITOT 0.3 09/16/2022   Lab Results  Component Value Date   HGBA1C 6.2 (H) 09/16/2022   HGBA1C 6.2 (A) 08/14/2022   HGBA1C 6.0 (H) 05/07/2022   HGBA1C 6.5 (H) 12/23/2021   HGBA1C 6.0 (H) 05/09/2020   Lab Results  Component Value Date   INSULIN 10.5 09/16/2022   Lab Results  Component Value Date   TSH 1.450 09/16/2022   Lab Results  Component Value Date   CHOL 168 12/23/2021   HDL 60 12/23/2021   LDLCALC 89 12/23/2021   TRIG 93 12/23/2021   CHOLHDL 2.8 12/23/2021   Lab Results  Component Value Date   VD25OH 26.2 (L) 09/16/2022   VD25OH 45 05/09/2020   VD25OH 53 04/03/2016   Lab Results  Component Value Date   WBC 4.9 12/23/2021   HGB 12.7 12/23/2021   HCT 41.3 09/16/2022   MCV  88.1 12/23/2021   PLT 322 12/23/2021   Lab Results  Component Value Date   IRON 78 09/16/2022   TIBC 446 09/16/2022   FERRITIN 22 09/16/2022   Attestation Statements:   Reviewed by clinician on day of visit: allergies, medications, problem list, medical history, surgical history, family history, social history, and previous encounter notes.  I, Kathlene November, BS, CMA, am acting as transcriptionist for [employer], DO.  I have reviewed the above documentation for accuracy and completeness, and I agree with the above. Jearld Lesch, DO

## 2022-11-09 ENCOUNTER — Encounter: Payer: Self-pay | Admitting: Bariatrics

## 2022-11-10 ENCOUNTER — Telehealth: Payer: Self-pay

## 2022-11-10 NOTE — Telephone Encounter (Addendum)
Initiated Prior authorization TXL:[id number] '3MG'$  tablets Via: Covermymeds Case/Key:[id number] Status: approved  as of 11/10/22 Reason:Coverage Starts on: 10/19/2022 12:00:00 AM, Coverage Ends on: 10/19/2023 Notified Pt via: Mychart   Pt unable to tolerate Rybelsus, switched to [facility]

## 2022-11-14 ENCOUNTER — Other Ambulatory Visit: Payer: Self-pay | Admitting: Bariatrics

## 2022-11-14 DIAGNOSIS — E559 Vitamin D deficiency, unspecified: Secondary | ICD-10-CM

## 2022-11-16 ENCOUNTER — Encounter: Payer: Self-pay | Admitting: Sports Medicine

## 2022-11-16 ENCOUNTER — Ambulatory Visit (INDEPENDENT_AMBULATORY_CARE_PROVIDER_SITE_OTHER): Payer: Medicare HMO | Admitting: Sports Medicine

## 2022-11-16 VITALS — BP 142/83 | HR 71 | Ht 64.0 in | Wt 261.1 lb

## 2022-11-16 DIAGNOSIS — E119 Type 2 diabetes mellitus without complications: Secondary | ICD-10-CM

## 2022-11-16 DIAGNOSIS — R7303 Prediabetes: Secondary | ICD-10-CM | POA: Diagnosis not present

## 2022-11-16 DIAGNOSIS — I10 Essential (primary) hypertension: Secondary | ICD-10-CM

## 2022-11-16 DIAGNOSIS — Z0000 Encounter for general adult medical examination without abnormal findings: Secondary | ICD-10-CM

## 2022-11-16 LAB — POCT GLYCOSYLATED HEMOGLOBIN (HGB A1C): Hemoglobin A1C: 6.7 % — AB (ref 4.0–5.6)

## 2022-11-16 LAB — POCT UA - MICROALBUMIN
Albumin/Creatinine Ratio, Urine, POC: <300
Creatinine, POC: 300 mg/dL
Microalbumin Ur, POC: 30 mg/L

## 2022-11-16 MED ORDER — MOUNJARO 2.5 MG/0.5ML ~~LOC~~ SOAJ: 2.5000 mg | SUBCUTANEOUS | 0 refills | Status: DC

## 2022-11-16 NOTE — Progress Notes (Signed)
    Procedures performed today:    None.  Independent interpretation of notes and tests performed by another provider:   None.  Brief History, Exam, Impression, and Recommendations:    Controlled type 2 diabetes mellitus without complication, without long-term current use of insulin (HCC) Diabetes mellitus type 2, she was unable to tolerate Rybelsus, continues metformin. We will go ahead and try Mounjaro this time. She is working with healthy weight and wellness on the diet and she exercises regularly with her husband. Hemoglobin A1c today was 6.7%, up from 6.2% 3 months ago. We did a urine microalbumin screening today.   Annual physical exam Diabetic eye exam was the summertime of 2023. She is on a 5-year recall for her Pap smear with gynecology per her report.   Benign essential hypertension Blood pressure is a bit elevated today, we will keep an eye on this, I think this will improve as she continues to lose weight.    ____________________________________________ [name]. [name], M.D., ABFM., CAQSM., AME. Primary Care and Sports Medicine [facility]  Adjunct Professor of [employer]  Risk manager

## 2022-11-16 NOTE — Assessment & Plan Note (Signed)
Diabetic eye exam was the summertime of 2023. She is on a 5-year recall for her Pap smear with gynecology per her report.

## 2022-11-16 NOTE — Assessment & Plan Note (Signed)
Diabetes mellitus type 2, she was unable to tolerate Rybelsus, continues metformin. We will go ahead and try Mounjaro this time. She is working with healthy weight and wellness on the diet and she exercises regularly with her husband. Hemoglobin A1c today was 6.7%, up from 6.2% 3 months ago. We did a urine microalbumin screening today.

## 2022-11-16 NOTE — Addendum Note (Signed)
Addended by: Ok Edwards on: 11/16/2022 10:46 AM   Modules accepted: Orders

## 2022-11-16 NOTE — Assessment & Plan Note (Signed)
Blood pressure is a bit elevated today, we will keep an eye on this, I think this will improve as she continues to lose weight.

## 2022-11-17 ENCOUNTER — Telehealth: Payer: Self-pay

## 2022-11-17 NOTE — Telephone Encounter (Addendum)
Initiated Prior authorization VMT:[id number] 2.'5MG'$ /0.5ML pen-injectors Via: Covermymeds Case/Key:[id number] Status: approved  as of 11/17/22 Reason: Coverage Starts on: 10/19/2022 12:00:00 AM, Coverage Ends on: 10/19/2023 Notified Pt via: Mychart

## 2022-11-18 ENCOUNTER — Ambulatory Visit (INDEPENDENT_AMBULATORY_CARE_PROVIDER_SITE_OTHER): Payer: Medicare HMO | Admitting: Bariatrics

## 2022-11-18 ENCOUNTER — Encounter: Payer: Self-pay | Admitting: Bariatrics

## 2022-11-18 VITALS — BP 138/79 | HR 73 | Temp 98.4°F | Ht 64.0 in | Wt 253.0 lb

## 2022-11-18 DIAGNOSIS — Z6841 Body Mass Index (BMI) 40.0 and over, adult: Secondary | ICD-10-CM | POA: Diagnosis not present

## 2022-11-18 DIAGNOSIS — E559 Vitamin D deficiency, unspecified: Secondary | ICD-10-CM | POA: Diagnosis not present

## 2022-11-18 DIAGNOSIS — Z7984 Long term (current) use of oral hypoglycemic drugs: Secondary | ICD-10-CM | POA: Diagnosis not present

## 2022-11-18 DIAGNOSIS — E1159 Type 2 diabetes mellitus with other circulatory complications: Secondary | ICD-10-CM

## 2022-11-18 DIAGNOSIS — E1169 Type 2 diabetes mellitus with other specified complication: Secondary | ICD-10-CM | POA: Diagnosis not present

## 2022-11-18 DIAGNOSIS — E669 Obesity, unspecified: Secondary | ICD-10-CM | POA: Insufficient documentation

## 2022-11-18 DIAGNOSIS — I152 Hypertension secondary to endocrine disorders: Secondary | ICD-10-CM | POA: Diagnosis not present

## 2022-11-18 MED ORDER — VITAMIN D (ERGOCALCIFEROL) 1.25 MG (50000 UNIT) PO CAPS: 50000.0000 [IU] | ORAL_CAPSULE | ORAL | 0 refills | Status: DC

## 2022-11-19 ENCOUNTER — Encounter: Payer: Self-pay | Admitting: Sports Medicine

## 2022-11-24 ENCOUNTER — Telehealth (INDEPENDENT_AMBULATORY_CARE_PROVIDER_SITE_OTHER): Payer: Medicare HMO | Admitting: Sports Medicine

## 2022-11-24 DIAGNOSIS — E119 Type 2 diabetes mellitus without complications: Secondary | ICD-10-CM

## 2022-11-24 MED ORDER — MOUNJARO 5 MG/0.5ML ~~LOC~~ SOAJ: 5.0000 mg | SUBCUTANEOUS | 0 refills | Status: DC

## 2022-11-24 MED ORDER — MOUNJARO 12.5 MG/0.5ML ~~LOC~~ SOAJ: 12.5000 mg | SUBCUTANEOUS | 0 refills | Status: DC

## 2022-11-24 MED ORDER — MOUNJARO 15 MG/0.5ML ~~LOC~~ SOAJ: 15.0000 mg | SUBCUTANEOUS | 11 refills | Status: DC

## 2022-11-24 MED ORDER — MOUNJARO 10 MG/0.5ML ~~LOC~~ SOAJ: 10.0000 mg | SUBCUTANEOUS | 0 refills | Status: DC

## 2022-11-24 MED ORDER — MOUNJARO 7.5 MG/0.5ML ~~LOC~~ SOAJ: 7.5000 mg | SUBCUTANEOUS | 0 refills | Status: DC

## 2022-11-24 NOTE — Assessment & Plan Note (Signed)
This pleasant 57 year old female with newly diagnosed type 2 diabetes and had some questions, I answered the questions, we talked about the pathophysiology of diabetes type 2, we talked about the mechanism of the multiple medications, she did get approved for [facility] so she is going to go and pick this up, she will do the first 3 months and then return for an A1c check.

## 2022-11-24 NOTE — Progress Notes (Signed)
   Virtual Visit via WebEx/MyChart   I connected with  Kelsey Snow  on 11/24/22 via WebEx/MyChart/Doximity Video and verified that I am speaking with the correct person using two identifiers.   I discussed the limitations, risks, security and privacy concerns of performing an evaluation and management service by WebEx/MyChart/Doximity Video, including the higher likelihood of inaccurate diagnosis and treatment, and the availability of in person appointments.  We also discussed the likely need of an additional face to face encounter for complete and high quality delivery of care.  I also discussed with the patient that there may be a patient responsible charge related to this service. The patient expressed understanding and wishes to proceed.  Provider location is in medical facility. Patient location is at their home, different from provider location. People involved in care of the patient during this telehealth encounter were myself, my nurse/medical assistant, and my front office/scheduling team member.  Review of Systems: No fevers, chills, night sweats, weight loss, chest pain, or shortness of breath.   Objective Findings:    General: Speaking full sentences, no audible heavy breathing.  Sounds alert and appropriately interactive.  Appears well.  Face symmetric.  Extraocular movements intact.  Pupils equal and round.  No nasal flaring or accessory muscle use visualized.  Independent interpretation of tests performed by another provider:   None.  Brief History, Exam, Impression, and Recommendations:    Controlled type 2 diabetes mellitus without complication, without long-term current use of insulin ([place]) This pleasant 57 year old female with newly diagnosed type 2 diabetes and had some questions, I answered the questions, we talked about the pathophysiology of diabetes type 2, we talked about the mechanism of the multiple medications, she did get approved for [facility] so she is going to  go and pick this up, she will do the first 3 months and then return for an A1c check.   I discussed the above assessment and treatment plan with the patient. The patient was provided an opportunity to ask questions and all were answered. The patient agreed with the plan and demonstrated an understanding of the instructions.   The patient was advised to call back or seek an in-person evaluation if the symptoms worsen or if the condition fails to improve as anticipated.   I provided 30 minutes of face to face and non-face-to-face time during this encounter date, time was needed to gather information, review chart, records, communicate/coordinate with staff remotely, as well as complete documentation.   ____________________________________________ [name]. [name], M.D., ABFM., CAQSM., AME. Primary Care and Sports Medicine  [facility]  Adjunct Professor of [place] of [facility] of Medicine  Risk manager

## 2022-11-30 ENCOUNTER — Encounter: Payer: Self-pay | Admitting: Sports Medicine

## 2022-11-30 NOTE — Telephone Encounter (Signed)
Unlikely allergies this time of the year, more likely a viral infection or sinus infection, she needs to see one of my partners or me.

## 2022-12-02 NOTE — Progress Notes (Unsigned)
Chief Complaint:   OBESITY Kelsey Snow is here to discuss her progress with her obesity treatment plan along with follow-up of her obesity related diagnoses. Kelsey Snow is on the Category 3 Plan and states she is following her eating plan approximately 90% of the time. Kelsey Snow states she is weight training/gym 60 minutes 3 times per week.  Today's visit was #: 5 Starting weight: 260 lbs Starting date: 09/16/2022 Today's weight: 253 lbs Today's date: 11/18/2022 Total lbs lost to date: 7 Total lbs lost since last in-office visit: 3  Interim History: Kelsey Snow is down 3 lbs. She has strayed some with her diet and has had increased starches.  Subjective:   1. Vitamin D deficiency Kelsey Snow is taking prescription Vitamin D as directed.  2. Diabetes mellitus type 2 in obese Kelsey Snow) She had been on Rybelsus.  3. Hypertension associated with type 2 diabetes mellitus ([place]) Pt is taking medications as directed.  Assessment/Plan:   1. Vitamin D deficiency Continue Vitamin D as prescribed. Refill- Vitamin D, Ergocalciferol, (DRISDOL) 1.25 MG (50000 UNIT) CAPS capsule; Take 1 capsule (50,000 Units total) by mouth every 7 (seven) days.  Dispense: 5 capsule; Refill: 0  2. Diabetes mellitus type 2 in obese [facility]) Kelsey Snow is seeing Dr. Darene Lamer at [facility] and has prescribed [facility] Snow and will begin [facility] when approved. Will keep all carbohydrates low (sweets, starches).  3. Hypertension associated with type 2 diabetes mellitus (HCC) No added salt. Continue medication.  4. Morbid obesity ([place]) 5. BMI 40.0-44.9, adult ([place]) Meal planning. Will adhere closely to the plan. Recipes I and II given to pt.  Kelsey Snow is currently in the action stage of change. As such, her goal is to continue with weight loss efforts. She has agreed to the Category 3 Plan.   Exercise goals:  As is  Behavioral modification strategies: increasing lean protein intake, decreasing simple carbohydrates, increasing vegetables, increasing water  intake, decreasing eating out, no skipping meals, meal planning and cooking strategies, keeping healthy foods in the home, and planning for success.  Kelsey Snow has agreed to follow-up with our clinic in 3 weeks. She was informed of the importance of frequent follow-up visits to maximize her success with intensive lifestyle modifications for her multiple health conditions.   Objective:   Blood pressure 138/79, pulse 73, temperature 98.4 F (36.9 C), height 5' 4"$  (1.626 m), weight 253 lb (114.8 kg), last menstrual period 02/26/2014, SpO2 99 %. Body mass index is 43.43 kg/m.  General: Cooperative, alert, well developed, in no acute distress. HEENT: Conjunctivae and lids unremarkable. Cardiovascular: Regular rhythm.  Lungs: Normal work of breathing. Neurologic: No focal deficits.   Lab Results  Component Value Date   CREATININE 0.71 09/16/2022   BUN 10 09/16/2022   NA 141 09/16/2022   K 4.9 09/16/2022   CL 103 09/16/2022   CO2 24 09/16/2022   Lab Results  Component Value Date   ALT 20 09/16/2022   AST 21 09/16/2022   ALKPHOS 86 09/16/2022   BILITOT 0.3 09/16/2022   Lab Results  Component Value Date   HGBA1C 6.7 (A) 11/16/2022   HGBA1C 6.2 (H) 09/16/2022   HGBA1C 6.2 (A) 08/14/2022   HGBA1C 6.0 (H) 05/07/2022   HGBA1C 6.5 (H) 12/23/2021   Lab Results  Component Value Date   INSULIN 10.5 09/16/2022   Lab Results  Component Value Date   TSH 1.450 09/16/2022   Lab Results  Component Value Date   CHOL 168 12/23/2021   HDL 60 12/23/2021  LDLCALC 89 12/23/2021   TRIG 93 12/23/2021   CHOLHDL 2.8 12/23/2021   Lab Results  Component Value Date   VD25OH 26.2 (L) 09/16/2022   VD25OH 45 05/09/2020   VD25OH 53 04/03/2016   Lab Results  Component Value Date   WBC 4.9 12/23/2021   HGB 12.7 12/23/2021   HCT 41.3 09/16/2022   MCV 88.1 12/23/2021   PLT 322 12/23/2021   Lab Results  Component Value Date   IRON 78 09/16/2022   TIBC 446 09/16/2022   FERRITIN 22  09/16/2022   Attestation Statements:   Reviewed by clinician on day of visit: allergies, medications, problem list, medical history, surgical history, family history, social history, and previous encounter notes.  I, Kathlene November, BS, CMA, am acting as transcriptionist for [employer], DO.  I have reviewed the above documentation for accuracy and completeness, and I agree with the above. Jearld Lesch, DO

## 2022-12-03 ENCOUNTER — Ambulatory Visit: Payer: Medicare HMO | Admitting: Sports Medicine

## 2022-12-03 ENCOUNTER — Encounter: Payer: Self-pay | Admitting: Bariatrics

## 2022-12-03 ENCOUNTER — Ambulatory Visit (INDEPENDENT_AMBULATORY_CARE_PROVIDER_SITE_OTHER): Payer: Medicare HMO | Admitting: Sports Medicine

## 2022-12-03 DIAGNOSIS — E669 Obesity, unspecified: Secondary | ICD-10-CM

## 2022-12-03 DIAGNOSIS — E1169 Type 2 diabetes mellitus with other specified complication: Secondary | ICD-10-CM

## 2022-12-03 DIAGNOSIS — M1812 Unilateral primary osteoarthritis of first carpometacarpal joint, left hand: Secondary | ICD-10-CM | POA: Diagnosis not present

## 2022-12-03 DIAGNOSIS — J310 Chronic rhinitis: Secondary | ICD-10-CM | POA: Diagnosis not present

## 2022-12-03 MED ORDER — FEXOFENADINE HCL 180 MG PO TABS: 180.0000 mg | ORAL_TABLET | Freq: Every day | ORAL | 3 refills | Status: DC

## 2022-12-03 MED ORDER — AZELASTINE-FLUTICASONE 137-50 MCG/ACT NA SUSP: NASAL | 11 refills | Status: DC

## 2022-12-03 NOTE — Progress Notes (Signed)
    Procedures performed today:    None.  Independent interpretation of notes and tests performed by another provider:   I did personally review wrist x-rays from urgent care, she does have mild triscaphe and first [facility] arthritic changes  Brief History, Exam, Impression, and Recommendations:    Rhinitis Samaia has a long history of runny nose, facial pain and pressure, she says this typically happens about this time a year. She also has some pressure in both ears. She thinks it is mostly allergies, no fevers, chills, muscle aches or bodyaches. Her exam is really unrevealing, only minimally erythematous turbinates. She has been doing Zyrtec-D without much improvement, she is also doing some over-the-counter oral cold and flu medications. We are going to switch to Dymista, she will switch to Allegra-D for a week and then regular Allegra 180. We can revisit this as needed.  Primary osteoarthritis of first carpometacarpal joint of left hand Known first [facility] osteoarthritis, we last discussed this in August 2023, I did review her x-rays which show very mild triscaphe and first [place] osteoarthritis. We will add some home conditioning, she can do over-the-counter analgesics and she can return to see me in 6 weeks, we will do an injection if not better.  Diabetes mellitus type 2 in obese [facility]) Patient was able to get [facility] approved, she started the first dose and feels good. We will recheck her A1c in about 3 months.    ____________________________________________ [name]. [name], M.D., ABFM., CAQSM., AME. Primary Care and Sports Medicine  [facility]  Adjunct Professor of [place] of [facility] of Medicine  Risk manager

## 2022-12-03 NOTE — Assessment & Plan Note (Signed)
Clydean has a long history of runny nose, facial pain and pressure, she says this typically happens about this time a year. She also has some pressure in both ears. She thinks it is mostly allergies, no fevers, chills, muscle aches or bodyaches. Her exam is really unrevealing, only minimally erythematous turbinates. She has been doing Zyrtec-D without much improvement, she is also doing some over-the-counter oral cold and flu medications. We are going to switch to Dymista, she will switch to Allegra-D for a week and then regular Allegra 180. We can revisit this as needed.

## 2022-12-03 NOTE — Assessment & Plan Note (Signed)
Known first [place] osteoarthritis, we last discussed this in August 2023, I did review her x-rays which show very mild triscaphe and first [place] osteoarthritis. We will add some home conditioning, she can do over-the-counter analgesics and she can return to see me in 6 weeks, we will do an injection if not better.

## 2022-12-03 NOTE — Assessment & Plan Note (Signed)
Patient was able to get [facility] approved, she started the first dose and feels good. We will recheck her A1c in about 3 months.

## 2022-12-04 ENCOUNTER — Encounter: Payer: Self-pay | Admitting: Sports Medicine

## 2022-12-04 DIAGNOSIS — J310 Chronic rhinitis: Secondary | ICD-10-CM

## 2022-12-04 LAB — POCT INFLUENZA A/B
Influenza A, POC: NEGATIVE
Influenza B, POC: NEGATIVE

## 2022-12-04 LAB — POC COVID19 BINAXNOW: SARS Coronavirus 2 Ag: NEGATIVE

## 2022-12-04 MED ORDER — FEXOFENADINE-PSEUDOEPHED ER 180-240 MG PO TB24: 1.0000 | ORAL_TABLET | Freq: Every day | ORAL | 3 refills | Status: DC

## 2022-12-04 MED ORDER — FLUTICASONE PROPIONATE 50 MCG/ACT NA SUSP: 2.0000 | Freq: Every day | NASAL | 6 refills | Status: DC

## 2022-12-04 MED ORDER — AZELASTINE HCL 0.1 % NA SOLN: 2.0000 | Freq: Two times a day (BID) | NASAL | 1 refills | Status: DC

## 2022-12-04 NOTE — Addendum Note (Signed)
Addended by: Tarri Glenn A on: 12/04/2022 10:18 AM   Modules accepted: Orders

## 2022-12-08 ENCOUNTER — Telehealth: Payer: Self-pay | Admitting: Sports Medicine

## 2022-12-08 NOTE — Telephone Encounter (Signed)
Called patient to schedule Medicare Annual Wellness Visit (AWV). Left message for patient to call back and schedule Medicare Annual Wellness Visit (AWV).  Last date of AWV: Never  Please schedule an appointment at any time with Dr. Darene Lamer.  If any questions, please contact me at [phone].  Thank you ,  Lin Givens Patient Access Advocate II Direct Dial: [phone]

## 2022-12-09 ENCOUNTER — Encounter: Payer: Self-pay | Admitting: Bariatrics

## 2022-12-09 ENCOUNTER — Ambulatory Visit (INDEPENDENT_AMBULATORY_CARE_PROVIDER_SITE_OTHER): Payer: Medicare HMO | Admitting: Bariatrics

## 2022-12-09 VITALS — BP 124/78 | HR 64 | Temp 98.6°F | Ht 64.0 in | Wt 254.0 lb

## 2022-12-09 DIAGNOSIS — E1159 Type 2 diabetes mellitus with other circulatory complications: Secondary | ICD-10-CM | POA: Diagnosis not present

## 2022-12-09 DIAGNOSIS — Z6841 Body Mass Index (BMI) 40.0 and over, adult: Secondary | ICD-10-CM | POA: Diagnosis not present

## 2022-12-09 DIAGNOSIS — E559 Vitamin D deficiency, unspecified: Secondary | ICD-10-CM

## 2022-12-09 DIAGNOSIS — E1169 Type 2 diabetes mellitus with other specified complication: Secondary | ICD-10-CM

## 2022-12-09 DIAGNOSIS — E669 Obesity, unspecified: Secondary | ICD-10-CM

## 2022-12-09 DIAGNOSIS — Z7985 Long-term (current) use of injectable non-insulin antidiabetic drugs: Secondary | ICD-10-CM | POA: Diagnosis not present

## 2022-12-09 DIAGNOSIS — I152 Hypertension secondary to endocrine disorders: Secondary | ICD-10-CM | POA: Diagnosis not present

## 2022-12-10 ENCOUNTER — Ambulatory Visit (INDEPENDENT_AMBULATORY_CARE_PROVIDER_SITE_OTHER): Payer: Medicare HMO

## 2022-12-10 ENCOUNTER — Ambulatory Visit (INDEPENDENT_AMBULATORY_CARE_PROVIDER_SITE_OTHER): Payer: Medicare HMO | Admitting: Sports Medicine

## 2022-12-10 ENCOUNTER — Encounter: Payer: Self-pay | Admitting: Sports Medicine

## 2022-12-10 VITALS — BP 152/89 | HR 70

## 2022-12-10 DIAGNOSIS — B079 Viral wart, unspecified: Secondary | ICD-10-CM

## 2022-12-10 DIAGNOSIS — M1812 Unilateral primary osteoarthritis of first carpometacarpal joint, left hand: Secondary | ICD-10-CM

## 2022-12-10 MED ORDER — TRIAMCINOLONE ACETONIDE 40 MG/ML IJ SUSP
40.0000 mg | Freq: Once | INTRAMUSCULAR | Status: AC
  Administered 2022-12-10: 40 mg via INTRAMUSCULAR

## 2022-12-10 NOTE — Addendum Note (Signed)
Addended by: Tarri Glenn A on: 12/10/2022 04:09 PM   Modules accepted: Orders

## 2022-12-10 NOTE — Assessment & Plan Note (Signed)
Cryotherapy as above. Return to see me as needed.

## 2022-12-10 NOTE — Assessment & Plan Note (Signed)
Known for [place] osteoarthritis, has failed conservative treatment, injected today, return to see me as needed.

## 2022-12-10 NOTE — Progress Notes (Signed)
    Procedures performed today:    Procedure: Real-time Ultrasound Guided injection of the left first [facility] Device: Samsung HS60  Verbal informed consent obtained.  Time-out conducted.  Noted no overlying erythema, induration, or other signs of local infection.  Skin prepped in a sterile fashion.  Local anesthesia: Topical Ethyl chloride.  With sterile technique and under real time ultrasound guidance: Noted arthritic joint, 1/2 cc lidocaine, 1/2 cc kenalog 40 injected easily. Completed without difficulty  Advised to call if fevers/chills, erythema, induration, drainage, or persistent bleeding.  Images permanently stored and available for review in PACS.  Impression: Technically successful ultrasound guided injection.  Procedure:  Cryodestruction of left anterior thigh verruca Consent obtained and verified. Time-out conducted. Noted no overlying erythema, induration, or other signs of local infection. Completed without difficulty using Cryo-Gun. Advised to call if fevers/chills, erythema, induration, drainage, or persistent bleeding.  Independent interpretation of notes and tests performed by another provider:   None.  Brief History, Exam, Impression, and Recommendations:    Primary osteoarthritis of first carpometacarpal joint of left hand Known for [facility] osteoarthritis, has failed conservative treatment, injected today, return to see me as needed.  Verruca left thigh Cryotherapy as above. Return to see me as needed.    ____________________________________________ [name]. [name], M.D., ABFM., CAQSM., AME. Primary Care and Sports Medicine [place] [facility]  Adjunct Professor of [place] of [facility] of Medicine  Risk manager

## 2022-12-12 ENCOUNTER — Other Ambulatory Visit: Payer: Self-pay | Admitting: Bariatrics

## 2022-12-12 DIAGNOSIS — E559 Vitamin D deficiency, unspecified: Secondary | ICD-10-CM

## 2022-12-18 DIAGNOSIS — M7918 Myalgia, other site: Secondary | ICD-10-CM | POA: Diagnosis not present

## 2022-12-18 DIAGNOSIS — M47816 Spondylosis without myelopathy or radiculopathy, lumbar region: Secondary | ICD-10-CM | POA: Diagnosis not present

## 2022-12-18 DIAGNOSIS — G8929 Other chronic pain: Secondary | ICD-10-CM | POA: Diagnosis not present

## 2022-12-18 DIAGNOSIS — M5416 Radiculopathy, lumbar region: Secondary | ICD-10-CM | POA: Diagnosis not present

## 2022-12-18 DIAGNOSIS — G894 Chronic pain syndrome: Secondary | ICD-10-CM | POA: Diagnosis not present

## 2022-12-18 DIAGNOSIS — M961 Postlaminectomy syndrome, not elsewhere classified: Secondary | ICD-10-CM | POA: Diagnosis not present

## 2022-12-18 DIAGNOSIS — Z79899 Other long term (current) drug therapy: Secondary | ICD-10-CM | POA: Diagnosis not present

## 2022-12-22 NOTE — Progress Notes (Unsigned)
Chief Complaint:   OBESITY Kelsey Snow is here to discuss her progress with her obesity treatment plan along with follow-up of her obesity related diagnoses. Kelsey Snow is on the Category 3 Plan and states she is following her eating plan approximately 50% of the time. Kelsey Snow states she is going to the gym for 60 minutes 3 times per week.  Today's visit was #: 6 Starting weight: 260 lbs Starting date: 09/16/2022 Today's weight: 254 lbs Today's date: 12/09/2022 Total lbs lost to date: 6 Total lbs lost since last in-office visit: 0  Interim History: Kelsey Snow is up 1 lb since her last visit. She has been off the [facility] and she had increased hunger.   Subjective:   1. Vitamin D deficiency Kelsey Snow is taking Vitamin D. She notes increase in allergies and some fatigue.   2. Hypertension associated with type 2 diabetes mellitus (Kelsey Snow) Agapita's blood pressure is controlled.   3. Diabetes mellitus type 2 in obese (HCC) Kelsey Snow is taking Mounjaro.   Assessment/Plan:   1. Vitamin D deficiency Kelsey Snow will continue Vitamin D as directed.   2. Hypertension associated with type 2 diabetes mellitus ([place]) Kelsey Snow will continue her medications, and avoid all added salt.   3. Diabetes mellitus type 2 in obese (HCC) Kelsey Snow will continue Mounjaro. She will watch her portions and we discussed raw vegetables.   4. Morbid obesity ([place])  5. BMI 40.0-44.9, adult (HCC) Kelsey Snow is currently in the action stage of change. As such, her goal is to continue with weight loss efforts. She has agreed to the Category 3 Plan.   Mindful eating and intentional eating were discussed. Make better choices and portion food out.   Exercise goals: As is.   Behavioral modification strategies: increasing lean protein intake, decreasing simple carbohydrates, increasing vegetables, increasing water intake, decreasing eating out, no skipping meals, meal planning and cooking strategies, keeping healthy foods in the home, and planning for  success.  Lanija has agreed to follow-up with our clinic in 2 to 3 weeks. She was informed of the importance of frequent follow-up visits to maximize her success with intensive lifestyle modifications for her multiple health conditions.   Objective:   Blood pressure 124/78, pulse 64, temperature 98.6 F (37 C), height '5\' 4"'$  (1.626 m), last menstrual period 02/26/2014, SpO2 98 %. Body mass index is 43.43 kg/m.  General: Cooperative, alert, well developed, in no acute distress. HEENT: Conjunctivae and lids unremarkable. Cardiovascular: Regular rhythm.  Lungs: Normal work of breathing. Neurologic: No focal deficits.   Lab Results  Component Value Date   CREATININE 0.71 09/16/2022   BUN 10 09/16/2022   NA 141 09/16/2022   K 4.9 09/16/2022   CL 103 09/16/2022   CO2 24 09/16/2022   Lab Results  Component Value Date   ALT 20 09/16/2022   AST 21 09/16/2022   ALKPHOS 86 09/16/2022   BILITOT 0.3 09/16/2022   Lab Results  Component Value Date   HGBA1C 6.7 (A) 11/16/2022   HGBA1C 6.2 (H) 09/16/2022   HGBA1C 6.2 (A) 08/14/2022   HGBA1C 6.0 (H) 05/07/2022   HGBA1C 6.5 (H) 12/23/2021   Lab Results  Component Value Date   INSULIN 10.5 09/16/2022   Lab Results  Component Value Date   TSH 1.450 09/16/2022   Lab Results  Component Value Date   CHOL 168 12/23/2021   HDL 60 12/23/2021   LDLCALC 89 12/23/2021   TRIG 93 12/23/2021   CHOLHDL 2.8 12/23/2021   Lab Results  Component Value Date   VD25OH 26.2 (L) 09/16/2022   VD25OH 45 05/09/2020   VD25OH 53 04/03/2016   Lab Results  Component Value Date   WBC 4.9 12/23/2021   HGB 12.7 12/23/2021   HCT 41.3 09/16/2022   MCV 88.1 12/23/2021   PLT 322 12/23/2021   Lab Results  Component Value Date   IRON 78 09/16/2022   TIBC 446 09/16/2022   FERRITIN 22 09/16/2022   Attestation Statements:   Reviewed by clinician on day of visit: allergies, medications, problem list, medical history, surgical history, family history,  social history, and previous encounter notes.   Wilhemena Durie, am acting as Location manager for [employer], DO.  I have reviewed the above documentation for accuracy and completeness, and I agree with the above. Jearld Lesch, DO

## 2022-12-23 ENCOUNTER — Other Ambulatory Visit: Payer: Self-pay | Admitting: Sports Medicine

## 2022-12-23 DIAGNOSIS — E119 Type 2 diabetes mellitus without complications: Secondary | ICD-10-CM

## 2022-12-24 ENCOUNTER — Encounter: Payer: Self-pay | Admitting: Bariatrics

## 2022-12-26 ENCOUNTER — Other Ambulatory Visit: Payer: Self-pay | Admitting: Bariatrics

## 2022-12-26 DIAGNOSIS — E559 Vitamin D deficiency, unspecified: Secondary | ICD-10-CM

## 2022-12-30 ENCOUNTER — Encounter: Payer: Self-pay | Admitting: Bariatrics

## 2022-12-30 ENCOUNTER — Ambulatory Visit (INDEPENDENT_AMBULATORY_CARE_PROVIDER_SITE_OTHER): Payer: Medicare HMO | Admitting: Bariatrics

## 2022-12-30 VITALS — BP 120/80 | HR 80 | Temp 97.5°F | Ht 64.0 in | Wt 248.0 lb

## 2022-12-30 DIAGNOSIS — E559 Vitamin D deficiency, unspecified: Secondary | ICD-10-CM

## 2022-12-30 DIAGNOSIS — R632 Polyphagia: Secondary | ICD-10-CM | POA: Insufficient documentation

## 2022-12-30 DIAGNOSIS — I10 Essential (primary) hypertension: Secondary | ICD-10-CM | POA: Diagnosis not present

## 2022-12-30 DIAGNOSIS — Z6841 Body Mass Index (BMI) 40.0 and over, adult: Secondary | ICD-10-CM

## 2022-12-30 MED ORDER — VALSARTAN-HYDROCHLOROTHIAZIDE 80-12.5 MG PO TABS: 1.0000 | ORAL_TABLET | Freq: Every day | ORAL | 0 refills | Status: DC

## 2023-01-06 NOTE — Progress Notes (Signed)
Chief Complaint:   OBESITY Kelsey Snow is here to discuss her progress with her obesity treatment plan along with follow-up of her obesity related diagnoses. Kelsey Snow is on the Category 3 plan and states she is following her eating plan approximately 50% of the time. Kelsey Snow states she is going to the gym 20-45 minutes 3 times per week.  Today's visit was #: 7 Starting weight: 260 lbs Starting date: 09/16/2022 Today's weight: 248 lbs Today's date: 12/30/2022 Total lbs lost to date: 12 Total lbs lost since last in-office visit: -6  Interim History: She is down another 6 pounds and doing well overall.  Subjective:   1. Vitamin D deficiency Taking vitamin D. Last vitamin D-26.2.  2. Benign essential hypertension Controlled.  3. Polyphagia Taking Mounjaro.   Assessment/Plan:   1. Vitamin D deficiency Continue vitamin D.  2. Benign essential hypertension Refill- - valsartan-hydrochlorothiazide (DIOVAN-HCT) 80-12.5 MG tablet; Take 1 tablet by mouth daily.  Dispense: 90 tablet; Refill: 0  3. Polyphagia 1.  Continue Mounjaro. 2.  Increase protein, increase fiber, increase water.  4. Morbid obesity (Kelsey Snow) BMI 40.0-44.9, adult (Kelsey Snow) 1.  Meal planning. 2.  Intentional eating.  Kelsey Snow is currently in the action stage of change. As such, her goal is to continue with weight loss efforts. She has agreed to the Category 3 plan.  Exercise goals: Be more active and more exercise.  Behavioral modification strategies: increasing lean protein intake, decreasing simple carbohydrates, increasing vegetables, increasing water intake, decreasing eating out, no skipping meals, meal planning and cooking strategies, keeping healthy foods in the home, and planning for success.  Kelsey Snow has agreed to follow-up with our clinic in 3 weeks, labs at next visit. She was informed of the importance of frequent follow-up visits to maximize her success with intensive lifestyle modifications for her multiple  health conditions.   Objective:   Blood pressure 120/80, pulse 80, temperature (!) 97.5 F (36.4 C), height 5\' 4"  (1.626 m), weight 248 lb (112.5 kg), last menstrual period 02/26/2014, SpO2 100 %. Body mass index is 42.57 kg/m.  General: Cooperative, alert, well developed, in no acute distress. HEENT: Conjunctivae and lids unremarkable. Cardiovascular: Regular rhythm.  Lungs: Normal work of breathing. Neurologic: No focal deficits.   Lab Results  Component Value Date   CREATININE 0.71 09/16/2022   BUN 10 09/16/2022   NA 141 09/16/2022   K 4.9 09/16/2022   CL 103 09/16/2022   CO2 24 09/16/2022   Lab Results  Component Value Date   ALT 20 09/16/2022   AST 21 09/16/2022   ALKPHOS 86 09/16/2022   BILITOT 0.3 09/16/2022   Lab Results  Component Value Date   HGBA1C 6.7 (A) 11/16/2022   HGBA1C 6.2 (H) 09/16/2022   HGBA1C 6.2 (A) 08/14/2022   HGBA1C 6.0 (H) 05/07/2022   HGBA1C 6.5 (H) 12/23/2021   Lab Results  Component Value Date   INSULIN 10.5 09/16/2022   Lab Results  Component Value Date   TSH 1.450 09/16/2022   Lab Results  Component Value Date   CHOL 168 12/23/2021   HDL 60 12/23/2021   LDLCALC 89 12/23/2021   TRIG 93 12/23/2021   CHOLHDL 2.8 12/23/2021   Lab Results  Component Value Date   VD25OH 26.2 (L) 09/16/2022   VD25OH 45 05/09/2020   VD25OH 53 04/03/2016   Lab Results  Component Value Date   WBC 4.9 12/23/2021   HGB 12.7 12/23/2021   HCT 41.3 09/16/2022   MCV 88.1 12/23/2021  PLT 322 12/23/2021   Lab Results  Component Value Date   IRON 78 09/16/2022   TIBC 446 09/16/2022   FERRITIN 22 09/16/2022    Attestation Statements:   Reviewed by clinician on day of visit: allergies, medications, problem list, medical history, surgical history, family history, social history, and previous encounter notes.  I, Dawn Whitmire, FNP-C, am acting as transcriptionist for Dr. Jearld Lesch.  I have reviewed the above documentation for accuracy and  completeness, and I agree with the above. Jearld Lesch, DO

## 2023-01-07 DIAGNOSIS — M533 Sacrococcygeal disorders, not elsewhere classified: Secondary | ICD-10-CM | POA: Diagnosis not present

## 2023-01-13 ENCOUNTER — Encounter: Payer: Self-pay | Admitting: Bariatrics

## 2023-01-16 ENCOUNTER — Other Ambulatory Visit: Payer: Self-pay | Admitting: Sports Medicine

## 2023-01-16 DIAGNOSIS — E119 Type 2 diabetes mellitus without complications: Secondary | ICD-10-CM

## 2023-01-19 ENCOUNTER — Other Ambulatory Visit: Payer: Self-pay | Admitting: Pharmacist

## 2023-01-19 ENCOUNTER — Ambulatory Visit (HOSPITAL_COMMUNITY)
Admission: RE | Admit: 2023-01-19 | Discharge: 2023-01-19 | Disposition: A | Discharge status: Home / Self Care | Payer: Medicare HMO | Source: Ambulatory Visit | Attending: Vascular Surgery | Admitting: Vascular Surgery

## 2023-01-19 ENCOUNTER — Ambulatory Visit: Payer: Medicare HMO | Admitting: Physician Assistant

## 2023-01-19 VITALS — BP 131/87 | HR 61 | Temp 98.0°F | Resp 20 | Ht 64.0 in | Wt 248.8 lb

## 2023-01-19 DIAGNOSIS — I6523 Occlusion and stenosis of bilateral carotid arteries: Secondary | ICD-10-CM

## 2023-01-19 NOTE — Progress Notes (Signed)
Office Note     CC:  follow up Requesting Provider:  Silverio Decamp,*  HPI: Kelsey Snow is a 57 y.o. (May 08, 1966) female who presents for surveillance of carotid artery stenosis.  She originally presented with asymptomatic left ICA dissection which has since resolved.  She denies any diagnosis of TIA or CVA since last office visit.  She also denies any neurological events including slurring speech, changes in vision, or one-sided weakness.  She is on aspirin and statin daily.  She follows regularly with her PCP for management of chronic medical conditions including hypertension, hyperlipidemia, and diabetes mellitus.  Most recent hemoglobin A1c was 6.2.  She denies tobacco use.   Past Medical History:  Diagnosis Date   Anemia    Anxiety    Arthritis    Back pain    Carpal tunnel syndrome    Depression    Diabetes    Essential hypertension, benign 03/07/2015   Fibromuscular dysplasia of carotid artery    GERD (gastroesophageal reflux disease)    History of degenerative disc disease    Hx of ectopic pregnancy    Hyperlipidemia    Hypothyroidism    Joint pain    Lumbosacral spondylolysis    Obese    OCD (obsessive compulsive disorder)    Thyroid disease     Past Surgical History:  Procedure Laterality Date   BACK SURGERY     L5 to S1 discectomy   ECTOPIC PREGNANCY SURGERY     ENDOMETRIAL ABLATION     ESOPHAGOGASTRODUODENOSCOPY     in 20's had pepic ulcers    TUBAL LIGATION     TUBAL LIGATION     UPPER GASTROINTESTINAL ENDOSCOPY      Social History   Socioeconomic History   Marital status: Married    Spouse name: Not on file   Number of children: 1   Years of education: 12   Highest education level: Not on file  Occupational History    Employer: [employer]  Tobacco Use   Smoking status: Never    Passive exposure: Never   Smokeless tobacco: Never  Vaping Use   Vaping Use: Never used  Substance and Sexual Activity   Alcohol use: Yes     Alcohol/week: 6.0 standard drinks of alcohol    Types: 6 Standard drinks or equivalent per week    Comment: weekly   Drug use: No   Sexual activity: Not on file  Other Topics Concern   Not on file  Social History Narrative   Not on file   Social Determinants of Health   Financial Resource Strain: Not on file  Food Insecurity: Not on file  Transportation Needs: Not on file  Physical Activity: Not on file  Stress: Not on file  Social Connections: Not on file  Intimate Partner Violence: Not on file    Family History  Problem Relation Age of Onset   Depression Mother    Cancer Mother        Pancreatic   Thyroid disease Mother        hypothyroidism   Varicose Veins Mother    Colon polyps Mother    High blood pressure Mother    Aneurysm Father    AAA (abdominal aortic aneurysm) Father    Sudden death Father    Anxiety disorder Father    Drug abuse Sister    Hyperlipidemia Brother    Diabetes Maternal Grandmother    Alcohol abuse Maternal Uncle    Esophageal  cancer Neg Hx    Colon cancer Neg Hx    Rectal cancer Neg Hx    Stomach cancer Neg Hx     Current Outpatient Medications  Medication Sig Dispense Refill   aspirin 81 MG EC tablet TAKE 1 TABLET (81 MG TOTAL) BY MOUTH DAILY. 90 tablet 3   azelastine (ASTELIN) 0.1 % nasal spray Place 2 sprays into both nostrils 2 (two) times daily. Use in each nostril as directed 301 mL 1   Cyanocobalamin (VITAMIN B 12 PO) Take by mouth.     fexofenadine-pseudoephedrine (ALLEGRA-D 24) 180-240 MG 24 hr tablet Take 1 tablet by mouth daily. 90 tablet 3   fluticasone (FLONASE) 50 MCG/ACT nasal spray Place 2 sprays into both nostrils daily. 16 g 6   levothyroxine (SYNTHROID) 137 MCG tablet TAKE 1 TABLET BY MOUTH DAILY BEFORE BREAKFAST 90 tablet 1   omeprazole (PRILOSEC) 40 MG capsule TAKE ONE CAPSULE BY MOUTH EVERY MORNING AND AT BEDTIME 60 capsule 3   rosuvastatin (CRESTOR) 10 MG tablet TAKE 1 TABLET BY MOUTH DAILY 90 tablet 1    tirzepatide (MOUNJARO) 10 MG/0.5ML Pen Inject 10 mg into the skin once a week. 2 mL 0   tirzepatide (MOUNJARO) 12.5 MG/0.5ML Pen Inject 12.5 mg into the skin once a week. 2 mL 0   tirzepatide (MOUNJARO) 15 MG/0.5ML Pen Inject 15 mg into the skin once a week. 2 mL 11   tirzepatide (MOUNJARO) 7.5 MG/0.5ML Pen Inject 7.5 mg into the skin once a week. 2 mL 0   traMADol (ULTRAM-ER) 200 MG 24 hr tablet Take 200 mg by mouth daily.     valsartan-hydrochlorothiazide (DIOVAN-HCT) 80-12.5 MG tablet Take 1 tablet by mouth daily. 90 tablet 0   Vitamin D, Ergocalciferol, (DRISDOL) 1.25 MG (50000 UNIT) CAPS capsule Take 1 capsule (50,000 Units total) by mouth every 7 (seven) days. 5 capsule 0   VITAMIN E PO Take by mouth.     No current facility-administered medications for this visit.    Allergies  Allergen Reactions   Bee Venom Anaphylaxis   Topiramate Other (See Comments)     REVIEW OF SYSTEMS:   [X]  denotes positive finding, [ ]  denotes negative finding Cardiac  Comments:  Chest pain or chest pressure:    Shortness of breath upon exertion:    Short of breath when lying flat:    Irregular heart rhythm:        Vascular    Pain in calf, thigh, or hip brought on by ambulation:    Pain in feet at night that wakes you up from your sleep:     Blood clot in your veins:    Leg swelling:         Pulmonary    Oxygen at home:    Productive cough:     Wheezing:         Neurologic    Sudden weakness in arms or legs:     Sudden numbness in arms or legs:     Sudden onset of difficulty speaking or slurred speech:    Temporary loss of vision in one eye:     Problems with dizziness:         Gastrointestinal    Blood in stool:     Vomited blood:         Genitourinary    Burning when urinating:     Blood in urine:        Psychiatric    Major depression:  Hematologic    Bleeding problems:    Problems with blood clotting too easily:        Skin    Rashes or ulcers:         Constitutional    Fever or chills:      PHYSICAL EXAMINATION:  Vitals:   01/19/23 1342  BP: 131/87  Pulse: 61  Resp: 20  Temp: 98 F (36.7 C)  TempSrc: Temporal  SpO2: 98%  Weight: 248 lb 12.8 oz (112.9 kg)  Height: 5\' 4"  (1.626 m)    General:  WDWN in NAD; vital signs documented above Gait: Not observed HENT: WNL, normocephalic Pulmonary: normal non-labored breathing , without Rales, rhonchi,  wheezing Cardiac: regular HR Abdomen: soft, NT, no masses Skin: without rashes Vascular Exam/Pulses: symmetrical radial and DP pulses Extremities: without ischemic changes, without Gangrene , without cellulitis; without open wounds;  Musculoskeletal: no muscle wasting or atrophy  Neurologic: A&O X 3; CN grossly intact Psychiatric:  The pt has Normal affect.   Non-Invasive Vascular Imaging:   R ICA 1-39% stenosis L ICA 40-59% stenosis    ASSESSMENT/PLAN:: 56 y.o. female here for follow up for surveillance of carotid artery stenosis  -Subjectively the patient has not experienced any neurological symptoms since last office visit.  Years ago she had an asymptomatic left ICA dissection that has since resolved.  Carotid duplex is unchanged with 1 to 39% stenosis of the right ICA and 40 to 59% stenosis of the left ICA.  She is on her aspirin and statin daily.  She follows regularly with her PCP for chronic medical conditions including hypertension, hyperlipidemia, and diabetes mellitus.  We will recheck the carotid duplex in 1 year per protocol.   Dagoberto Ligas, PA-C [facility] [phone]  Clinic MD:   Carlis Abbott

## 2023-01-19 NOTE — Progress Notes (Signed)
Patient appearing on report for quality metrics.  Outreached patient to discuss medication management. She was busy at the time of my call this afternoon, but agreed to a call tomorrow. Will re-attempt on 01/20/23.  Larinda Buttery, PharmD, BCPS Clinical Pharmacist [facility]

## 2023-01-20 ENCOUNTER — Other Ambulatory Visit: Payer: Medicare HMO | Admitting: Pharmacist

## 2023-01-20 NOTE — Progress Notes (Signed)
01/20/2023 Name: Kelsey Snow MRN: [id number] DOB: 08/12/1966   Kelsey Snow is a 57 y.o. year old female who presented for a telephone visit.   They were referred to the pharmacist by a quality report for assistance in managing  quality metrics: controlling blood pressure (CBP), statin use persons with diabetes (SUPD) . Also will discuss diabetes control with patient due to new diagnosis, review medications, offer support.   Subjective:  Care Team: Primary Care Provider: Silverio Decamp, MD ; Next Scheduled Visit: 02/19/23   Medication Access/Adherence  Current Pharmacy:  [facility] [id number] - [place], [place] [address] Phone: [phone] Fax: [phone]   Diabetes:  Current medications: mounjaro 5mg  weekly, titrating in doses Medications tried in the past: rybelsus (GI intolerance)   Current glucose readings: not currently checking, appropriate for well-controlled  Patient denies hypoglycemic s/sx including dizziness, shakiness, sweating.  Patient denies hyperglycemic symptoms including polyuria, polydipsia, polyphagia, nocturia, neuropathy, blurred vision.    Hypertension:  Current medications: valsartan-hctz 80-12.5mg  daily   Patient has a validated, automated, upper arm home BP cuff Current blood pressure readings readings: [id number] systolics  Patient denies hypotensive s/sx including dizziness, lightheadedness.  Patient denies hypertensive symptoms including headache, chest pain, shortness of breath   Hyperlipidemia/ASCVD Risk Reduction  Current lipid lowering medications: rosuvatstin 10mg  daily     Objective:  Lab Results  Component Value Date   HGBA1C 6.7 (A) 11/16/2022    Lab Results  Component Value Date   CREATININE 0.71 09/16/2022   BUN 10 09/16/2022   NA 141 09/16/2022   K 4.9 09/16/2022   CL 103 09/16/2022   CO2 24 09/16/2022    Lab Results  Component Value Date   CHOL  168 12/23/2021   HDL 60 12/23/2021   LDLCALC 89 12/23/2021   TRIG 93 12/23/2021   CHOLHDL 2.8 12/23/2021    Medications Reviewed Today     Reviewed by Darius Bump, RPH (Pharmacist) on 01/20/23 at 1003  Med List Status: <None>   Medication Order Taking? Sig Documenting Provider Last Dose Status Informant  aspirin 81 MG EC tablet [id number] Yes TAKE 1 TABLET (81 MG TOTAL) BY MOUTH DAILY. Silverio Decamp, MD Taking Active   azelastine (ASTELIN) 0.1 % nasal spray [id number] Yes Place 2 sprays into both nostrils 2 (two) times daily. Use in each nostril as directed Silverio Decamp, MD Taking Active   Cyanocobalamin (VITAMIN B 12 PO) [id number] Yes Take 1 tablet by mouth every other day. [provider] Taking Active   EPINEPHrine 0.3 mg/0.3 mL IJ SOAJ injection [id number] Yes Inject 0.3 mg into the muscle as needed for anaphylaxis. [provider] Taking Active   fexofenadine-pseudoephedrine (ALLEGRA-D 24) 180-240 MG 24 hr tablet [id number] Yes Take 1 tablet by mouth daily. Silverio Decamp, MD Taking Active   fluticasone [facility]) 50 MCG/ACT nasal spray [id number] No Place 2 sprays into both nostrils daily.  Patient not taking: Reported on 01/20/2023   Silverio Decamp, MD Not Taking Active   levothyroxine (SYNTHROID) 137 MCG tablet [id number] Yes TAKE 1 TABLET BY MOUTH DAILY BEFORE BREAKFAST Silverio Decamp, MD Taking Active   omeprazole (PRILOSEC) 40 MG capsule [id number] Yes TAKE ONE CAPSULE BY MOUTH EVERY MORNING AND AT BEDTIME  Patient taking differently: Take 40 mg by mouth daily.   Silverio Decamp, MD Taking Active   rosuvastatin (CRESTOR) 10 MG tablet [id number] Yes TAKE 1 TABLET BY  MOUTH DAILY Silverio Decamp, MD Taking Active   tirzepatide [facility]) 10 MG/0.5ML Pen [id number] Yes Inject 10 mg into the skin once a week. Silverio Decamp, MD Taking Active   tirzepatide [facility]) 12.5 MG/0.5ML Pen [id number] Yes Inject 12.5  mg into the skin once a week. Silverio Decamp, MD Taking Active   tirzepatide [facility]) 15 MG/0.5ML Pen [id number] Yes Inject 15 mg into the skin once a week. Silverio Decamp, MD Taking Active   tirzepatide [facility]) 7.5 MG/0.5ML Pen [id number] Yes Inject 7.5 mg into the skin once a week. Silverio Decamp, MD Taking Active   traMADol (ULTRAM-ER) 200 MG 24 hr tablet [id number] Yes Take 200 mg by mouth daily. [provider] Taking Active   valsartan-hydrochlorothiazide (DIOVAN-HCT) 80-12.5 MG tablet [id number] Yes Take 1 tablet by mouth daily. Jearld Lesch A, DO Taking Active   Vitamin D, Ergocalciferol, (DRISDOL) 1.25 MG (50000 UNIT) CAPS capsule [id number]  Take 1 capsule (50,000 Units total) by mouth every 7 (seven) days. Georgia Lopes, DO  Active               Assessment/Plan:   Diabetes: - Currently controlled, pt is doing well on mounjaro and titrating doses - Reviewed long term cardiovascular and renal outcomes of uncontrolled blood sugar - Reviewed goal A1c, goal fasting, and goal 2 hour post prandial glucose - Recommend to continue current regimen   Hypertension: - Currently controlled, some office readings with slight elevations but home readings are within goal, reinforced ideal BP <130/80 - Reviewed long term cardiovascular and renal outcomes of uncontrolled blood pressure - Reviewed appropriate blood pressure monitoring technique and reviewed goal blood pressure. Recommended to check home blood pressure and heart rate periodically - Recommend to continue current regimen     Hyperlipidemia/ASCVD Risk Reduction: - Currently controlled., patient reports no gaps in medication fill histories and no concerns with medication adherence - Recommend to continue current regimen   Larinda Buttery, PharmD, [facility]

## 2023-01-21 ENCOUNTER — Encounter: Payer: Self-pay | Admitting: Sports Medicine

## 2023-01-21 DIAGNOSIS — E119 Type 2 diabetes mellitus without complications: Secondary | ICD-10-CM

## 2023-01-21 NOTE — Telephone Encounter (Signed)
Mounjaro available at [facility] [place] . Pended this for you. Will you please send for patient ? They have 2.5mg , 5mg  and 7.5mg  in stock at the moment.

## 2023-01-22 ENCOUNTER — Encounter: Payer: Self-pay | Admitting: Sports Medicine

## 2023-01-24 ENCOUNTER — Other Ambulatory Visit: Payer: Self-pay | Admitting: Sports Medicine

## 2023-01-24 DIAGNOSIS — E039 Hypothyroidism, unspecified: Secondary | ICD-10-CM

## 2023-02-01 ENCOUNTER — Encounter: Payer: Self-pay | Admitting: Sports Medicine

## 2023-02-03 ENCOUNTER — Encounter: Payer: Self-pay | Admitting: Bariatrics

## 2023-02-03 ENCOUNTER — Ambulatory Visit (INDEPENDENT_AMBULATORY_CARE_PROVIDER_SITE_OTHER): Payer: Medicare HMO | Admitting: Bariatrics

## 2023-02-03 VITALS — BP 118/73 | HR 68 | Temp 97.8°F | Ht 64.0 in | Wt 241.0 lb

## 2023-02-03 DIAGNOSIS — E1159 Type 2 diabetes mellitus with other circulatory complications: Secondary | ICD-10-CM

## 2023-02-03 DIAGNOSIS — Z7985 Long-term (current) use of injectable non-insulin antidiabetic drugs: Secondary | ICD-10-CM | POA: Diagnosis not present

## 2023-02-03 DIAGNOSIS — E7800 Pure hypercholesterolemia, unspecified: Secondary | ICD-10-CM | POA: Diagnosis not present

## 2023-02-03 DIAGNOSIS — E119 Type 2 diabetes mellitus without complications: Secondary | ICD-10-CM | POA: Diagnosis not present

## 2023-02-03 DIAGNOSIS — Z6841 Body Mass Index (BMI) 40.0 and over, adult: Secondary | ICD-10-CM

## 2023-02-03 DIAGNOSIS — R5383 Other fatigue: Secondary | ICD-10-CM

## 2023-02-03 DIAGNOSIS — I152 Hypertension secondary to endocrine disorders: Secondary | ICD-10-CM

## 2023-02-03 DIAGNOSIS — E559 Vitamin D deficiency, unspecified: Secondary | ICD-10-CM

## 2023-02-03 MED ORDER — VITAMIN D (ERGOCALCIFEROL) 1.25 MG (50000 UNIT) PO CAPS: 50000.0000 [IU] | ORAL_CAPSULE | ORAL | 0 refills | Status: DC

## 2023-02-04 LAB — COMPREHENSIVE METABOLIC PANEL
ALT: 18 IU/L (ref 0–32)
AST: 15 IU/L (ref 0–40)
Albumin/Globulin Ratio: 1.7 (ref 1.2–2.2)
Albumin: 4.3 g/dL (ref 3.8–4.9)
Alkaline Phosphatase: 73 IU/L (ref 44–121)
BUN/Creatinine Ratio: 19 (ref 9–23)
BUN: 14 mg/dL (ref 6–24)
Bilirubin Total: 0.4 mg/dL (ref 0.0–1.2)
CO2: 24 mmol/L (ref 20–29)
Calcium: 10 mg/dL (ref 8.7–10.2)
Chloride: 102 mmol/L (ref 96–106)
Creatinine, Ser: 0.75 mg/dL (ref 0.57–1.00)
Globulin, Total: 2.5 g/dL (ref 1.5–4.5)
Glucose: 92 mg/dL (ref 70–99)
Potassium: 4.2 mmol/L (ref 3.5–5.2)
Sodium: 142 mmol/L (ref 134–144)
Total Protein: 6.8 g/dL (ref 6.0–8.5)
eGFR: 94 mL/min/{1.73_m2} (ref >59)

## 2023-02-04 LAB — LIPID PANEL WITH LDL/HDL RATIO
Cholesterol, Total: 170 mg/dL (ref 100–199)
HDL: 58 mg/dL (ref >39)
LDL Chol Calc (NIH): 94 mg/dL (ref 0–99)
LDL/HDL Ratio: 1.6 ratio (ref 0.0–3.2)
Triglycerides: 96 mg/dL (ref 0–149)
VLDL Cholesterol Cal: 18 mg/dL (ref 5–40)

## 2023-02-04 LAB — HEMOGLOBIN A1C
Est. average glucose Bld gHb Est-mCnc: 131 mg/dL
Hgb A1c MFr Bld: 6.2 % — ABNORMAL HIGH (ref 4.8–5.6)

## 2023-02-04 LAB — INSULIN, RANDOM: INSULIN: 20.9 u[IU]/mL (ref 2.6–24.9)

## 2023-02-04 LAB — TSH+T4F+T3FREE
Free T4: 1.93 ng/dL — ABNORMAL HIGH (ref 0.82–1.77)
T3, Free: 3.2 pg/mL (ref 2.0–4.4)
TSH: 0.519 u[IU]/mL (ref 0.450–4.500)

## 2023-02-04 LAB — VITAMIN D 25 HYDROXY (VIT D DEFICIENCY, FRACTURES): Vit D, 25-Hydroxy: 58.5 ng/mL (ref 30.0–100.0)

## 2023-02-05 ENCOUNTER — Encounter: Payer: Self-pay | Admitting: Sports Medicine

## 2023-02-09 NOTE — Progress Notes (Signed)
Chief Complaint:   OBESITY Kelsey Snow is here to discuss her progress with her obesity treatment plan along with follow-up of her obesity related diagnoses. Kelsey Snow is on the Category 3 Plan and states she is following her eating plan approximately 50% of the time. Kelsey Snow states she is at the gym for 90 minutes 2-3 times per week.  Today's visit was #: 8 Starting weight: 260 lbs Starting date: 09/16/2022 Today's weight: 241 lbs Today's date: 02/03/2023 Total lbs lost to date: 19 Total lbs lost since last in-office visit: 7  Interim History: Jakaylee is down 7 lbs since her last visit and she is doing well overall.   Subjective:   1. Controlled type 2 diabetes mellitus without complication, without long-term current use of insulin Kelsey Snow is taking Mounjaro.   2. Vitamin D deficiency Kelsey Snow is taking Vitamin D.  3. Hypertension associated with type 2 diabetes mellitus Kelsey Snow is taking her medications as directed.   4. Hypercholesterolemia Kelsey Snow is taking Crestor.   5. Other fatigue Kelsey Snow notes fatigue.   Assessment/Plan:   1. Controlled type 2 diabetes mellitus without complication, without long-term current use of insulin We will check labs today. Kelsey Snow will do snacks and yogurt, and increase protein.   - Insulin, random - Hemoglobin A1c - Comprehensive metabolic panel  2. Vitamin D deficiency We will check labs today, and we will refill prescription Vitamin D for 1 month.   - VITAMIN D 25 Hydroxy (Vit-D Deficiency, Fractures) - Vitamin D, Ergocalciferol, (DRISDOL) 1.25 MG (50000 UNIT) CAPS capsule; Take 1 capsule (50,000 Units total) by mouth every 7 (seven) days.  Dispense: 5 capsule; Refill: 0  3. Hypertension associated with type 2 diabetes mellitus Kelsey Snow will continue her medications.   4. Hypercholesterolemia We will check labs today, and we will follow-up at Kelsey Snow's next visit.   - Lipid Panel With LDL/HDL Ratio  5. Other fatigue We will check labs today, and we  will follow-up at Kelsey Snow's next visit.   - TSH+T4F+T3Free  6. Morbid obesity  7. BMI 40.0-44.9, adult Kelsey Snow is currently in the action stage of change. As such, her goal is to continue with weight loss efforts. She has agreed to the Category 3 Plan.   Meal planning and intentional eating were discussed.   Exercise goals: As is.   Behavioral modification strategies: increasing lean protein intake, decreasing simple carbohydrates, increasing vegetables, increasing water intake, decreasing eating out, no skipping meals, meal planning and cooking strategies, keeping healthy foods in the home, and planning for success.  Kelsey Snow has agreed to follow-up with our clinic in 4 weeks. She was informed of the importance of frequent follow-up visits to maximize her success with intensive lifestyle modifications for her multiple health conditions.   Kelsey Snow was informed we would discuss her lab results at her next visit unless there is a critical issue that needs to be addressed sooner. Kelsey Snow agreed to keep her next visit at the agreed upon time to discuss these results.  Objective:   Blood pressure 118/73, pulse 68, temperature 97.8 F (36.6 C), height  (1.626 m), weight 241 lb (109.3 kg), last menstrual period 02/26/2014, SpO2 100 %. Body mass index is 41.37 kg/m.  General: Cooperative, alert, well developed, in no acute distress. HEENT: Conjunctivae and lids unremarkable. Cardiovascular: Regular rhythm.  Lungs: Normal work of breathing. Neurologic: No focal deficits.   Lab Results  Component Value Date   CREATININE 0.75 02/03/2023   BUN 14 02/03/2023   NA 142  02/03/2023   K 4.2 02/03/2023   CL 102 02/03/2023   CO2 24 02/03/2023   Lab Results  Component Value Date   ALT 18 02/03/2023   AST 15 02/03/2023   ALKPHOS 73 02/03/2023   BILITOT 0.4 02/03/2023   Lab Results  Component Value Date   HGBA1C 6.2 (H) 02/03/2023   HGBA1C 6.7 (A) 11/16/2022   HGBA1C 6.2 (H) 09/16/2022    HGBA1C 6.2 (A) 08/14/2022   HGBA1C 6.0 (H) 05/07/2022   Lab Results  Component Value Date   INSULIN 20.9 02/03/2023   INSULIN 10.5 09/16/2022   Lab Results  Component Value Date   TSH 0.519 02/03/2023   Lab Results  Component Value Date   CHOL 170 02/03/2023   HDL 58 02/03/2023   LDLCALC 94 02/03/2023   TRIG 96 02/03/2023   CHOLHDL 2.8 12/23/2021   Lab Results  Component Value Date   VD25OH 58.5 02/03/2023   VD25OH 26.2 (L) 09/16/2022   VD25OH 45 05/09/2020   Lab Results  Component Value Date   WBC 4.9 12/23/2021   HGB 12.7 12/23/2021   HCT 41.3 09/16/2022   MCV 88.1 12/23/2021   PLT 322 12/23/2021   Lab Results  Component Value Date   IRON 78 09/16/2022   TIBC 446 09/16/2022   FERRITIN 22 09/16/2022   Attestation Statements:   Reviewed by clinician on day of visit: allergies, medications, problem list, medical history, surgical history, family history, social history, and previous encounter notes.   Trude Mcburney, am acting as Energy manager for [employer], DO.  I have reviewed the above documentation for accuracy and completeness, and I agree with the above. Corinna Capra, DO

## 2023-02-15 ENCOUNTER — Other Ambulatory Visit: Payer: Self-pay | Admitting: Sports Medicine

## 2023-02-15 DIAGNOSIS — E119 Type 2 diabetes mellitus without complications: Secondary | ICD-10-CM

## 2023-02-17 DIAGNOSIS — G894 Chronic pain syndrome: Secondary | ICD-10-CM | POA: Diagnosis not present

## 2023-02-17 DIAGNOSIS — M47816 Spondylosis without myelopathy or radiculopathy, lumbar region: Secondary | ICD-10-CM | POA: Diagnosis not present

## 2023-02-17 DIAGNOSIS — M961 Postlaminectomy syndrome, not elsewhere classified: Secondary | ICD-10-CM | POA: Diagnosis not present

## 2023-02-17 DIAGNOSIS — M7918 Myalgia, other site: Secondary | ICD-10-CM | POA: Diagnosis not present

## 2023-02-17 DIAGNOSIS — M5416 Radiculopathy, lumbar region: Secondary | ICD-10-CM | POA: Diagnosis not present

## 2023-02-19 ENCOUNTER — Ambulatory Visit: Payer: Medicare HMO | Admitting: Sports Medicine

## 2023-02-26 ENCOUNTER — Encounter: Payer: Self-pay | Admitting: Sports Medicine

## 2023-02-26 ENCOUNTER — Ambulatory Visit (INDEPENDENT_AMBULATORY_CARE_PROVIDER_SITE_OTHER): Payer: Medicare HMO | Admitting: Sports Medicine

## 2023-02-26 VITALS — BP 117/76 | HR 84 | Ht 64.0 in | Wt 234.0 lb

## 2023-02-26 DIAGNOSIS — E119 Type 2 diabetes mellitus without complications: Secondary | ICD-10-CM | POA: Diagnosis not present

## 2023-02-26 NOTE — Progress Notes (Signed)
    Procedures performed today:    None.  Independent interpretation of notes and tests performed by another provider:   None.  Brief History, Exam, Impression, and Recommendations:    Controlled type 2 diabetes mellitus without complication, without long-term current use of insulin (HCC) Doing well on Mounjaro 10, she has lost 40 pounds, we can revisit this in 6 months.    ____________________________________________ Ihor Austin. Benjamin Stain, M.D., ABFM., CAQSM., AME. Primary Care and Sports Medicine [facility]  Adjunct Professor of Family Medicine  [employer]  Restaurant manager, fast food

## 2023-02-26 NOTE — Assessment & Plan Note (Signed)
Doing well on Mounjaro 10, she has lost 40 pounds, we can revisit this in 6 months.

## 2023-03-03 ENCOUNTER — Ambulatory Visit (INDEPENDENT_AMBULATORY_CARE_PROVIDER_SITE_OTHER): Payer: Medicare HMO | Admitting: Bariatrics

## 2023-03-03 ENCOUNTER — Encounter: Payer: Self-pay | Admitting: Bariatrics

## 2023-03-03 VITALS — BP 117/76 | HR 71 | Temp 97.7°F | Ht 64.0 in | Wt 237.0 lb

## 2023-03-03 DIAGNOSIS — Z6841 Body Mass Index (BMI) 40.0 and over, adult: Secondary | ICD-10-CM

## 2023-03-03 DIAGNOSIS — I10 Essential (primary) hypertension: Secondary | ICD-10-CM

## 2023-03-03 DIAGNOSIS — E559 Vitamin D deficiency, unspecified: Secondary | ICD-10-CM | POA: Diagnosis not present

## 2023-03-03 MED ORDER — VALSARTAN-HYDROCHLOROTHIAZIDE 80-12.5 MG PO TABS
1.0000 | ORAL_TABLET | Freq: Every day | ORAL | 0 refills | Status: DC
Start: 2023-03-03 — End: 2023-05-21

## 2023-03-03 MED ORDER — VITAMIN D (ERGOCALCIFEROL) 1.25 MG (50000 UNIT) PO CAPS: 50000.0000 [IU] | ORAL_CAPSULE | ORAL | 0 refills | Status: DC

## 2023-03-06 ENCOUNTER — Other Ambulatory Visit: Payer: Self-pay | Admitting: Sports Medicine

## 2023-03-06 ENCOUNTER — Other Ambulatory Visit: Payer: Self-pay | Admitting: Bariatrics

## 2023-03-06 DIAGNOSIS — E119 Type 2 diabetes mellitus without complications: Secondary | ICD-10-CM

## 2023-03-06 DIAGNOSIS — E559 Vitamin D deficiency, unspecified: Secondary | ICD-10-CM

## 2023-03-08 ENCOUNTER — Encounter: Payer: Self-pay | Admitting: Sports Medicine

## 2023-03-08 NOTE — Progress Notes (Unsigned)
Chief Complaint:   OBESITY Kelsey Snow is here to discuss her progress with her obesity treatment plan along with follow-up of her obesity related diagnoses. Kelsey Snow is on the Category 3 Plan and states she is following her eating plan approximately 95% of the time. Kelsey Snow states she is at the gym for 120 minutes 3 times per week.  Today's visit was #: 9 Starting weight: 260 lbs Starting date: 09/16/2022 Today's weight: 237 lbs Today's date: 03/03/2023 Total lbs lost to date: 23 Total lbs lost since last in-office visit: 4  Interim History: Kelsey Snow is down another 4 pounds since her last visit.  She had a plateau.  Subjective:   1. Vitamin D deficiency Kelsey Snow is taking vitamin D as directed.  She notes some mild fatigue.  2. Benign essential hypertension Kelsey Snow's blood pressure is controlled at 117/76.  She is taking Diovan-HCT.  Assessment/Plan:   1. Vitamin D deficiency Kelsey Snow will continue prescription vitamin D, and we will refill for 1 month.  - Vitamin D, Ergocalciferol, (DRISDOL) 1.25 MG (50000 UNIT) CAPS capsule; Take 1 capsule (50,000 Units total) by mouth every 7 (seven) days.  Dispense: 5 capsule; Refill: 0  2. Benign essential hypertension Kelsey Snow will continue Diovan-HCT, and we will refill for 90 days.  - valsartan-hydrochlorothiazide (DIOVAN-HCT) 80-12.5 MG tablet; Take 1 tablet by mouth daily.  Dispense: 90 tablet; Refill: 0  3. Morbid obesity (HCC)  4. BMI 40.0-44.9, adult (HCC) Kelsey Snow is currently in the action stage of change. As such, her goal is to continue with weight loss efforts. She has agreed to the Category 3 Plan.   Meal planning and intentional eating were discussed.  Exercise goals: As is.   Behavioral modification strategies: increasing lean protein intake, decreasing simple carbohydrates, increasing vegetables, increasing water intake, decreasing eating out, no skipping meals, meal planning and cooking strategies, keeping healthy foods in the home,  and planning for success.  Kelsey Snow has agreed to follow-up with our clinic in 4 weeks. She was informed of the importance of frequent follow-up visits to maximize her success with intensive lifestyle modifications for her multiple health conditions.   Objective:   Blood pressure 117/76, pulse 71, temperature 97.7 F (36.5 C), height 5\' 4"  (1.626 m), weight 237 lb (107.5 kg), last menstrual period 02/26/2014, SpO2 98 %. Body mass index is 40.68 kg/m.  General: Cooperative, alert, well developed, in no acute distress. HEENT: Conjunctivae and lids unremarkable. Cardiovascular: Regular rhythm.  Lungs: Normal work of breathing. Neurologic: No focal deficits.   Lab Results  Component Value Date   CREATININE 0.75 02/03/2023   BUN 14 02/03/2023   NA 142 02/03/2023   K 4.2 02/03/2023   CL 102 02/03/2023   CO2 24 02/03/2023   Lab Results  Component Value Date   ALT 18 02/03/2023   AST 15 02/03/2023   ALKPHOS 73 02/03/2023   BILITOT 0.4 02/03/2023   Lab Results  Component Value Date   HGBA1C 6.2 (H) 02/03/2023   HGBA1C 6.7 (A) 11/16/2022   HGBA1C 6.2 (H) 09/16/2022   HGBA1C 6.2 (A) 08/14/2022   HGBA1C 6.0 (H) 05/07/2022   Lab Results  Component Value Date   INSULIN 20.9 02/03/2023   INSULIN 10.5 09/16/2022   Lab Results  Component Value Date   TSH 0.519 02/03/2023   Lab Results  Component Value Date   CHOL 170 02/03/2023   HDL 58 02/03/2023   LDLCALC 94 02/03/2023   TRIG 96 02/03/2023   CHOLHDL 2.8 12/23/2021  Lab Results  Component Value Date   VD25OH 58.5 02/03/2023   VD25OH 26.2 (L) 09/16/2022   VD25OH 45 05/09/2020   Lab Results  Component Value Date   WBC 4.9 12/23/2021   HGB 12.7 12/23/2021   HCT 41.3 09/16/2022   MCV 88.1 12/23/2021   PLT 322 12/23/2021   Lab Results  Component Value Date   IRON 78 09/16/2022   TIBC 446 09/16/2022   FERRITIN 22 09/16/2022   Attestation Statements:   Reviewed by clinician on day of visit: allergies,  medications, problem list, medical history, surgical history, family history, social history, and previous encounter notes.   Trude Mcburney, am acting as Energy manager for [employer], DO.  I have reviewed the above documentation for accuracy and completeness, and I agree with the above. Corinna Capra, DO

## 2023-03-31 ENCOUNTER — Encounter: Payer: Self-pay | Admitting: Bariatrics

## 2023-03-31 ENCOUNTER — Ambulatory Visit (INDEPENDENT_AMBULATORY_CARE_PROVIDER_SITE_OTHER): Payer: Medicare HMO | Admitting: Bariatrics

## 2023-03-31 VITALS — BP 134/83 | HR 64 | Temp 97.7°F | Ht 64.0 in | Wt 232.0 lb

## 2023-03-31 DIAGNOSIS — I10 Essential (primary) hypertension: Secondary | ICD-10-CM | POA: Diagnosis not present

## 2023-03-31 DIAGNOSIS — E559 Vitamin D deficiency, unspecified: Secondary | ICD-10-CM | POA: Diagnosis not present

## 2023-03-31 DIAGNOSIS — Z6839 Body mass index (BMI) 39.0-39.9, adult: Secondary | ICD-10-CM

## 2023-03-31 DIAGNOSIS — E669 Obesity, unspecified: Secondary | ICD-10-CM | POA: Diagnosis not present

## 2023-03-31 MED ORDER — VITAMIN D (ERGOCALCIFEROL) 1.25 MG (50000 UNIT) PO CAPS: 50000.0000 [IU] | ORAL_CAPSULE | ORAL | 0 refills | Status: DC

## 2023-03-31 NOTE — Progress Notes (Signed)
WEIGHT SUMMARY AND BIOMETRICS  Weight Lost Since Last Visit: 5lb   Vitals Temp: 97.7 F (36.5 C) BP: 134/83 Pulse Rate: 64 SpO2: 100 %   Anthropometric Measurements Height: 5\' 4"  (1.626 m) Weight: 232 lb (105.2 kg) BMI (Calculated): 39.8 Weight at Last Visit: 237lb Weight Lost Since Last Visit: 5lb Starting Weight: 260lb Total Weight Loss (lbs): 28 lb (12.7 kg)   Body Composition  Body Fat %: 47.9 % Fat Mass (lbs): 111.4 lbs Muscle Mass (lbs): 114.8 lbs Total Body Water (lbs): 80.6 lbs Visceral Fat Rating : 15   Other Clinical Data Fasting: no Labs: no Today's Visit #: 10 Starting Date: 09/16/22    OBESITY Kelsey Snow is here to discuss her progress with her obesity treatment plan along with follow-up of her obesity related diagnoses.     Nutrition Plan: the Category 3 plan - 80% adherence.  Current exercise: yard work and the gym  Interim History:  She is down an additional 5 lbs.  Eating all of the food on the plan., Is skipping meals, Meeting calorie goals., and Water intake is adequate.  Pharmacotherapy: Kelsey Snow is on Mounjaro 12.5 mg SQ weekly Adverse side effects: None Hunger is moderately controlled.  Cravings are moderately controlled.  Assessment/Plan:   1. Vitamin D deficiency Vitamin D Deficiency Vitamin D is at goal of 50.  Most recent vitamin D level was 58.5. She is on  prescription ergocalciferol 50,000 IU weekly. Lab Results  Component Value Date   VD25OH 58.5 02/03/2023   VD25OH 26.2 (L) 09/16/2022   VD25OH 45 05/09/2020    Plan: Refill prescription vitamin D 50,000 IU weekly.   Hypertension Hypertension stable.  Medication(s):  Diovan -HCT 80-12.5 mg  BP Readings from Last 3 Encounters:  03/31/23 134/83  03/03/23 117/76  02/26/23 117/76   Lab Results  Component Value Date   CREATININE 0.75 02/03/2023   CREATININE 0.71 09/16/2022   CREATININE 0.75 12/23/2021     Plan: Continue all antihypertensives at  current dosages. No added salt. Will keep sodium content to 1,500 mg or less per day.       Generalized Obesity: Current BMI BMI (Calculated): 39.8   Pharmacotherapy Plan Continue  Mounjaro 12.5 mg SQ weekly  Kelsey Snow is currently in the action stage of change. As such, her goal is to continue with weight loss efforts.  She has agreed to the Category 3 plan.  Exercise goals: For substantial health benefits, adults should do at least 150 minutes (2 hours and 30 minutes) a week of moderate-intensity, or 75 minutes (1 hour and 15 minutes) a week of vigorous-intensity aerobic physical activity, or an equivalent combination of moderate- and vigorous-intensity aerobic activity. Aerobic activity should be performed in episodes of at least 10 minutes, and preferably, it should be spread throughout the week.  Behavioral modification strategies: increasing lean protein intake, no meal skipping, meal planning , increase water intake, better snacking choices, planning for success, increasing vegetables, and keep healthy foods in the home.  Kelsey Snow has agreed to follow-up with our clinic in 4 weeks.       Objective:   VITALS: Per patient if applicable, see vitals. GENERAL: Alert and in no acute distress. CARDIOPULMONARY: No increased WOB. Speaking in clear sentences.  PSYCH: Pleasant and cooperative. Speech normal rate and rhythm. Affect is appropriate. Insight and judgement are appropriate. Attention is focused, linear, and appropriate.  NEURO: Oriented as arrived to appointment on time with no prompting.   Attestation Statements:    This  was prepared with the assistance of Engineer, civil (consulting).  Occasional wrong-word or sound-a-like substitutions may have occurred due to the inherent limitations of voice recognition software.   Kelsey Capra, DO

## 2023-04-03 ENCOUNTER — Other Ambulatory Visit: Payer: Self-pay | Admitting: Sports Medicine

## 2023-04-03 DIAGNOSIS — E119 Type 2 diabetes mellitus without complications: Secondary | ICD-10-CM

## 2023-04-19 DIAGNOSIS — M47816 Spondylosis without myelopathy or radiculopathy, lumbar region: Secondary | ICD-10-CM | POA: Diagnosis not present

## 2023-04-19 DIAGNOSIS — M5416 Radiculopathy, lumbar region: Secondary | ICD-10-CM | POA: Diagnosis not present

## 2023-04-19 DIAGNOSIS — M7918 Myalgia, other site: Secondary | ICD-10-CM | POA: Diagnosis not present

## 2023-04-19 DIAGNOSIS — Z79899 Other long term (current) drug therapy: Secondary | ICD-10-CM | POA: Diagnosis not present

## 2023-04-19 DIAGNOSIS — M961 Postlaminectomy syndrome, not elsewhere classified: Secondary | ICD-10-CM | POA: Diagnosis not present

## 2023-04-19 DIAGNOSIS — G894 Chronic pain syndrome: Secondary | ICD-10-CM | POA: Diagnosis not present

## 2023-04-29 ENCOUNTER — Ambulatory Visit (INDEPENDENT_AMBULATORY_CARE_PROVIDER_SITE_OTHER): Payer: Medicare HMO | Admitting: Bariatrics

## 2023-04-29 ENCOUNTER — Encounter: Payer: Self-pay | Admitting: Bariatrics

## 2023-04-29 DIAGNOSIS — Z6838 Body mass index (BMI) 38.0-38.9, adult: Secondary | ICD-10-CM | POA: Diagnosis not present

## 2023-04-29 DIAGNOSIS — E669 Obesity, unspecified: Secondary | ICD-10-CM

## 2023-04-29 DIAGNOSIS — I10 Essential (primary) hypertension: Secondary | ICD-10-CM

## 2023-04-29 DIAGNOSIS — E559 Vitamin D deficiency, unspecified: Secondary | ICD-10-CM

## 2023-04-29 DIAGNOSIS — E119 Type 2 diabetes mellitus without complications: Secondary | ICD-10-CM

## 2023-04-29 MED ORDER — VITAMIN D (ERGOCALCIFEROL) 1.25 MG (50000 UNIT) PO CAPS: 50000.0000 [IU] | ORAL_CAPSULE | ORAL | 0 refills | Status: DC

## 2023-04-29 MED ORDER — MOUNJARO 15 MG/0.5ML ~~LOC~~ SOAJ
15.0000 mg | SUBCUTANEOUS | Status: DC
Start: 2023-04-29 — End: 2023-07-02

## 2023-04-29 NOTE — Progress Notes (Signed)
WEIGHT SUMMARY AND BIOMETRICS  Weight Lost Since Last Visit: 9lb   Vitals Temp: 97.8 F (36.6 C) BP: 113/80 Pulse Rate: 68 SpO2: 100 %   Anthropometric Measurements Height: 5\' 4"  (1.626 m) Weight: 223 lb (101.2 kg) BMI (Calculated): 38.26 Weight at Last Visit: 232lb Weight Lost Since Last Visit: 9lb Starting Weight: 260lb Total Weight Loss (lbs): 37 lb (16.8 kg)   Body Composition  Body Fat %: 47 % Fat Mass (lbs): 105 lbs Muscle Mass (lbs): 112.4 lbs Total Body Water (lbs): 34.6 lbs Visceral Fat Rating : 14   Other Clinical Data Fasting: no Labs: no Today's Visit #: 11 Starting Date: 09/16/22    OBESITY Kelsey Snow is here to discuss her progress with her obesity treatment plan along with follow-up of her obesity related diagnoses.     Nutrition Plan: the Category 3 plan - 85% adherence.  Current exercise:  Kelsey Snow goes to the gym.  Interim History:  She is down another 9 lbs since her last visit.  Her goal weight is 200 lbs.  Eating all of the food on the plan., Protein intake is as prescribed, Is not skipping meals, Water intake is adequate., and Denies polyphagia  Pharmacotherapy: Kelsey Snow is on Mounjaro 12.5 mg SQ weekly Adverse side effects: None Hunger is moderately controlled.  Cravings are moderately controlled.  Assessment/Plan:   1. Vitamin D deficiency Vitamin D Deficiency Vitamin D is at goal of 50.  Most recent vitamin D level was 58. She is on  prescription ergocalciferol 50,000 IU weekly. Lab Results  Component Value Date   VD25OH 58.5 02/03/2023   VD25OH 26.2 (L) 09/16/2022   VD25OH 45 05/09/2020    Plan: Refill prescription vitamin D 50,000 IU weekly.   2. Benign essential hypertension Hypertension Hypertension improved.  Medication(s): Diovan 80/12.5 mg 1 daily   BP Readings from Last 3 Encounters:  04/29/23 113/80  03/31/23 134/83  03/03/23 117/76   Lab Results  Component Value Date   CREATININE 0.75 02/03/2023    CREATININE 0.71 09/16/2022   CREATININE 0.75 12/23/2021     Plan: Continue all antihypertensives at current dosages. No added salt. Will keep sodium content to 1,500 mg or less per day.       Generalized Obesity: Current BMI BMI (Calculated): 38.26   Pharmacotherapy Plan Continue  Mounjaro 12.5 mg SQ weekly  Kelsey Snow is currently in the action stage of change. As such, her goal is to continue with weight loss efforts.  She has agreed to the Category 3 plan.  Exercise goals: For substantial health benefits, adults should do at least 150 minutes (2 hours and 30 minutes) a week of moderate-intensity, or 75 minutes (1 hour and 15 minutes) a week of vigorous-intensity aerobic physical activity, or an equivalent combination of moderate- and vigorous-intensity aerobic activity. Aerobic activity should be performed in episodes of at least 10 minutes, and preferably, it should be spread throughout the week.  Behavioral modification strategies: increasing lean protein intake, better snacking choices, planning for success, and ways to avoid night time snacking. She is exercising on a regular basis.   Kelsey Snow has agreed to follow-up with our clinic in 4 weeks.       Objective:   VITALS: Per patient if applicable, see vitals. GENERAL: Alert and in no acute distress. CARDIOPULMONARY: No increased WOB. Speaking in clear sentences.  PSYCH: Pleasant and cooperative. Speech normal rate and rhythm. Affect is appropriate. Insight and judgement are appropriate. Attention is focused, linear, and appropriate.  NEURO:  Oriented as arrived to appointment on time with no prompting.   Attestation Statements:    This was prepared with the assistance of Engineer, civil (consulting).  Occasional wrong-word or sound-a-like substitutions may have occurred due to the inherent limitations of voice recognition software.   Corinna Capra, DO

## 2023-04-30 ENCOUNTER — Encounter: Payer: Self-pay | Admitting: Sports Medicine

## 2023-05-03 DIAGNOSIS — H5213 Myopia, bilateral: Secondary | ICD-10-CM | POA: Diagnosis not present

## 2023-05-03 DIAGNOSIS — Z0100 Encounter for examination of eyes and vision without abnormal findings: Secondary | ICD-10-CM | POA: Diagnosis not present

## 2023-05-03 LAB — HM DIABETES EYE EXAM

## 2023-05-04 ENCOUNTER — Encounter: Payer: Self-pay | Admitting: Sports Medicine

## 2023-05-04 MED ORDER — TIRZEPATIDE 12.5 MG/0.5ML ~~LOC~~ SOAJ: 12.5000 mg | SUBCUTANEOUS | 1 refills | Status: DC

## 2023-05-04 NOTE — Telephone Encounter (Signed)
 See other MyChart message

## 2023-05-08 ENCOUNTER — Other Ambulatory Visit: Payer: Self-pay | Admitting: Sports Medicine

## 2023-05-11 ENCOUNTER — Encounter: Payer: Self-pay | Admitting: Sports Medicine

## 2023-05-11 MED ORDER — TIRZEPATIDE 12.5 MG/0.5ML ~~LOC~~ SOAJ: 12.5000 mg | SUBCUTANEOUS | 11 refills | Status: DC

## 2023-05-18 ENCOUNTER — Encounter: Payer: Self-pay | Admitting: Sports Medicine

## 2023-05-18 DIAGNOSIS — R42 Dizziness and giddiness: Secondary | ICD-10-CM

## 2023-05-19 ENCOUNTER — Encounter: Payer: Self-pay | Admitting: Physician Assistant

## 2023-05-19 ENCOUNTER — Ambulatory Visit (INDEPENDENT_AMBULATORY_CARE_PROVIDER_SITE_OTHER): Payer: Medicare HMO | Admitting: Physician Assistant

## 2023-05-19 VITALS — BP 126/39 | HR 67 | Ht 64.0 in | Wt 222.1 lb

## 2023-05-19 DIAGNOSIS — I10 Essential (primary) hypertension: Secondary | ICD-10-CM

## 2023-05-19 DIAGNOSIS — R55 Syncope and collapse: Secondary | ICD-10-CM | POA: Diagnosis not present

## 2023-05-19 DIAGNOSIS — R519 Headache, unspecified: Secondary | ICD-10-CM

## 2023-05-19 DIAGNOSIS — I951 Orthostatic hypotension: Secondary | ICD-10-CM

## 2023-05-19 LAB — POC COVID19 BINAXNOW: SARS Coronavirus 2 Ag: NEGATIVE

## 2023-05-19 MED ORDER — VALSARTAN 80 MG PO TABS
80.0000 mg | ORAL_TABLET | Freq: Every day | ORAL | 0 refills | Status: DC
Start: 2023-05-19 — End: 2023-05-21

## 2023-05-19 NOTE — Telephone Encounter (Signed)
Patient scheduled.

## 2023-05-19 NOTE — Telephone Encounter (Signed)
Please see the MyChart message reply(ies) for my assessment and plan.    This patient gave consent for this Medical Advice Message and is aware that it may result in a bill to their insurance company, as well as the possibility of receiving a bill for a co-payment or deductible. They are an established patient, but are not seeking medical advice exclusively about a problem treated during an in person or video visit in the last seven days. I did not recommend an in person or video visit within seven days of my reply.    I spent a total of 5 minutes cumulative time within 7 days through MyChart messaging.  Thomas Thekkekandam, MD   

## 2023-05-19 NOTE — Progress Notes (Signed)
Acute Office Visit  Subjective:     Patient ID: Kelsey Snow, female    DOB: [birth year], 57 y.o.   MRN: [id number]  Chief Complaint  Patient presents with   Near Syncope    This started since Sat. Mostly morning when getting up.    HPI Patient is in today for near syncope that started suddenly over the weekend when she went to get out of bed the next morning. She has never had anything like this before. It has happened a few more times since then all in the morning when she goes from laying to position change. She denies any fever, chills, body aches, palpitations, chest pains. She has had some sinus congestion and headache. She has not had any medication changes recently. She is still on mounjaro 12.5mg  weekly. She drinks water. She does not have a lot of appetite on mounjaro.    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 12/16/2017   Epigastric pain 12/07/2019   Chronic pain syndrome 11/26/2016   Esophageal reflux 03/25/2013   Facet arthropathy, cervical 06/05/2016   Foraminal stenosis of cervical region 05/03/2014   Hypercholesterolemia 05/12/2013   Low back pain 05/03/2014   Lumbar radiculopathy 11/29/2017   Postlaminectomy syndrome of lumbar region 05/01/2015   Sacroiliac joint pain 07/28/2018   History of endometrial ablation 01/10/2020   Insomnia 05/09/2020   Hemangioma 05/27/2020   Right foot injury  07/24/2020   Lesion of dorsum of nose 08/12/2020   Tinnitus 11/13/2020   COVID-19 04/16/2021   Controlled type 2 diabetes mellitus without complication, without long-term current use of insulin (HCC) 12/24/2021   Primary osteoarthritis of first carpometacarpal joint of left hand 05/21/2022   Seborrheic keratosis 08/14/2022   Benign essential hypertension 08/14/2022   Other fatigue 09/16/2022   SOB (shortness of breath) on exertion 09/16/2022   Elevated glucose 09/16/2022   B12 deficiency 09/16/2022   Vitamin D deficiency 09/16/2022   Absolute anemia 09/16/2022   Health care maintenance 09/16/2022   Prediabetes 09/30/2022   Morbid obesity (HCC) 11/18/2022   BMI 40.0-44.9, adult (HCC) 11/18/2022   Diabetes mellitus type 2 in obese 11/18/2022   Hypertension associated with type 2 diabetes mellitus (HCC) 11/18/2022   Rhinitis 12/03/2022   Verruca left thigh 12/10/2022   Polyphagia 12/30/2022   Resolved Ambulatory Problems    Diagnosis Date Noted   Neck pain 10/10/2013   Spotting 11/16/2013   Skin tag 11/16/2013   Right shoulder pain 10/31/2015   Dark stools 10/31/2015   Nevus 06/18/2016   Cervical lymphadenopathy 10/29/2016   Influenza-like illness 11/12/2016   Poison ivy dermatitis 05/18/2017   Sebaceous cyst 06/03/2018   Depressive disorder 05/12/2013   Past Medical History:  Diagnosis Date   Anemia    Anxiety    Arthritis    Back pain    Carpal tunnel syndrome    Depression    Diabetes (HCC)    Essential hypertension, benign 03/07/2015   Fibromuscular dysplasia  of carotid artery (HCC)    GERD (gastroesophageal reflux disease)    History of degenerative disc disease    Hx of ectopic pregnancy    Hyperlipidemia    Joint pain    Lumbosacral spondylolysis    Obese    OCD (obsessive compulsive disorder)    Thyroid disease      ROS  See HPI.     Objective:    BP (!) 126/39   Pulse 67   Ht 5\' 4"  (1.626 m)   Wt 222 lb 1.3 oz (100.7 kg)   LMP 02/26/2014    SpO2 100%   BMI 38.12 kg/m  BP Readings from Last 3 Encounters:  05/19/23 (!) 126/39  04/29/23 113/80  03/31/23 134/83   Wt Readings from Last 3 Encounters:  05/19/23 222 lb 1.3 oz (100.7 kg)  04/29/23 223 lb (101.2 kg)  03/31/23 232 lb (105.2 kg)   .Marland KitchenOrthostatic VS for the past 24 hrs (Last 3 readings):  BP- Lying Pulse- Lying BP- Sitting Pulse- Sitting BP- Standing at 0 minutes Pulse- Standing at 0 minutes  05/19/23 1147 124/56 66 126/68 67 98/51 72  05/19/23 1101 138/67 67 130/73 69 (!) 121/39 69     Physical Exam Constitutional:      Appearance: Normal appearance. She is obese.  HENT:     Head: Normocephalic.  Cardiovascular:     Rate and Rhythm: Normal rate and regular rhythm.     Pulses: Normal pulses.  Pulmonary:     Effort: Pulmonary effort is normal.     Breath sounds: Normal breath sounds.  Musculoskeletal:     Right lower leg: No edema.     Left lower leg: No edema.  Neurological:     General: No focal deficit present.     Mental Status: She is alert and oriented to person, place, and time.     Gait: Gait normal.     Comments: Negative Dix Hallpike.   Psychiatric:        Mood and Affect: Mood normal.         Assessment & Plan:  Marland KitchenMarland KitchenDemitria was seen today for near syncope.  Diagnoses and all orders for this visit:  Orthostatic hypotension -     CBC w/Diff/Platelet -     Fe+TIBC+Fer -     TSH -     CMP14+EGFR -     POC COVID-19  Near syncope -     CBC w/Diff/Platelet -     Fe+TIBC+Fer -     TSH -     CMP14+EGFR -     POC COVID-19  Acute nonintractable headache, unspecified headache type -     POC COVID-19  Benign essential hypertension -     valsartan (DIOVAN) 80 MG tablet; Take 1 tablet (80 mg total) by mouth daily.   Orthostatic BP confirm orthostatic hypotension Negative dix hallpike for vertigo Due to HA will test for covid which was negative ? If mounjaro and loss of appetite cause some hypotension Diastolic BP low- stop diuretic  and just take diovan that was sent to pharmacy Will get labs to look at thyroid and electrolytes Push fluids and make sure eating some salt in diet Consider compression stockings and getting up slowly HO given Consider heart monitor if symptoms continue or develop palpitations Follow up in 2 weeks with Dr. Karie Schwalbe.   Return in about 2 weeks (around 06/02/2023) for Dr. Karie Schwalbe PCP.  Tandy Gaw, PA-C

## 2023-05-19 NOTE — Patient Instructions (Signed)
Orthostatic Hypotension Blood pressure is a measurement of how strongly, or weakly, your circulating blood is pressing against the walls of your arteries. Orthostatic hypotension is a drop in blood pressure that can happen when you change positions, such as when you go from lying down to standing. Arteries are blood vessels that carry blood from your heart throughout your body. When blood pressure is too low, you may not get enough blood to your brain or to the rest of your organs. Orthostatic hypotension can cause light-headedness, sweating, rapid heartbeat, blurred vision, and fainting. These symptoms require further investigation into the cause. What are the causes? Orthostatic hypotension can be caused by many things, including: Sudden changes in posture, such as standing up quickly after you have been sitting or lying down. Loss of blood (anemia) or loss of body fluids (dehydration). Heart problems, neurologic problems, or hormone problems. Pregnancy. Aging. The risk for this condition increases as you get older. Severe infection (sepsis). Certain medicines, such as medicines for high blood pressure or medicines that make the body lose excess fluids (diuretics). What are the signs or symptoms? Symptoms of this condition may include: Weakness, light-headedness, or dizziness. Sweating. Blurred vision. Tiredness (fatigue). Rapid heartbeat. Fainting, in severe cases. How is this diagnosed? This condition is diagnosed based on: Your symptoms and medical history. Your blood pressure measurements. Your health care provider will check your blood pressure when you are: Lying down. Sitting. Standing. A blood pressure reading is recorded as two numbers, such as "120 over 80" (or 120/80). The first ("top") number is called the systolic pressure. It is a measure of the pressure in your arteries as your heart beats. The second ("bottom") number is called the diastolic pressure. It is a measure of  the pressure in your arteries when your heart relaxes between beats. Blood pressure is measured in a unit called mmHg. Healthy blood pressure for most adults is 120/80 mmHg. Orthostatic hypotension is defined as a 20 mmHg drop in systolic pressure or a 10 mmHg drop in diastolic pressure within 3 minutes of standing. Other information or tests that may be used to diagnose orthostatic hypotension include: Your other vital signs, such as your heart rate and temperature. Blood tests. An electrocardiogram (ECG) or echocardiogram. A Holter monitor. This is a device you wear that records your heart rhythm continuously, usually for 24-48 hours. Tilt table test. For this test, you will be safely secured to a table that moves you from a lying position to an upright position. Your heart rhythm and blood pressure will be monitored during the test. How is this treated? This condition may be treated by: Changing your diet. This may involve eating more salt (sodium) or drinking more water. Changing the dosage of certain medicines you are taking that might be lowering your blood pressure. Correcting the underlying reason for the orthostatic hypotension. Wearing compression stockings. Taking medicines to raise your blood pressure. Avoiding actions that trigger symptoms. Follow these instructions at home: Medicines Take over-the-counter and prescription medicines only as told by your health care provider. Follow instructions from your health care provider about changing the dosage of your current medicines, if this applies. Do not stop or adjust any of your medicines on your own. Eating and drinking  Drink enough fluid to keep your urine pale yellow. Eat extra salt only as directed. Do not add extra salt to your diet unless advised by your health care provider. Eat frequent, small meals. Avoid standing up suddenly after eating. General instructions    Get up slowly from lying down or sitting positions. This  gives your blood pressure a chance to adjust. Avoid hot showers and excessive heat as directed by your health care provider. Engage in regular physical activity as directed by your health care provider. If you have compression stockings, wear them as told. Keep all follow-up visits. This is important. Contact a health care provider if: You have a fever for more than 2-3 days. You feel more thirsty than usual. You feel dizzy or weak. Get help right away if: You have chest pain. You have a fast or irregular heartbeat. You become sweaty or feel light-headed. You feel short of breath. You faint. You have any symptoms of a stroke. "BE FAST" is an easy way to remember the main warning signs of a stroke: B - Balance. Signs are dizziness, sudden trouble walking, or loss of balance. E - Eyes. Signs are trouble seeing or a sudden change in vision. F - Face. Signs are sudden weakness or numbness of the face, or the face or eyelid drooping on one side. A - Arms. Signs are weakness or numbness in an arm. This happens suddenly and usually on one side of the body. S - Speech. Signs are sudden trouble speaking, slurred speech, or trouble understanding what people say. T - Time. Time to call emergency services. Write down what time symptoms started. You have other signs of a stroke, such as: A sudden, severe headache with no known cause. Nausea or vomiting. Seizure. These symptoms may represent a serious problem that is an emergency. Do not wait to see if the symptoms will go away. Get medical help right away. Call your local emergency services (911 in the [place] Do not drive yourself to the hospital. Summary Orthostatic hypotension is a sudden drop in blood pressure. It can cause light-headedness, sweating, rapid heartbeat, blurred vision, and fainting. Orthostatic hypotension can be diagnosed by having your blood pressure taken while lying down, sitting, and then standing. Treatment may involve  changing your diet, wearing compression stockings, sitting up slowly, adjusting your medicines, or correcting the underlying reason for the orthostatic hypotension. Get help right away if you have chest pain, a fast or irregular heartbeat, or symptoms of a stroke. This information is not intended to replace advice given to you by your health care provider. Make sure you discuss any questions you have with your health care provider. Document Revised: 12/19/2020 Document Reviewed: 12/19/2020 Elsevier Patient Education  2024 Elsevier Inc.  

## 2023-05-19 NOTE — Progress Notes (Signed)
Negative covid

## 2023-05-20 NOTE — Progress Notes (Signed)
Kelsey Snow,   Normal hemoglobin, no anemia.  Normal WBC, no infection.  No electrolyte issues.  TSH is low trending towards too much thyroid hormone circulating. We need to add Free T4, Free T3 and anti TPO antibodies to labs.

## 2023-05-21 ENCOUNTER — Ambulatory Visit (INDEPENDENT_AMBULATORY_CARE_PROVIDER_SITE_OTHER): Payer: Medicare HMO | Admitting: Sports Medicine

## 2023-05-21 ENCOUNTER — Other Ambulatory Visit (INDEPENDENT_AMBULATORY_CARE_PROVIDER_SITE_OTHER): Payer: Medicare HMO

## 2023-05-21 ENCOUNTER — Encounter: Payer: Self-pay | Admitting: Sports Medicine

## 2023-05-21 VITALS — BP 124/81

## 2023-05-21 DIAGNOSIS — I10 Essential (primary) hypertension: Secondary | ICD-10-CM | POA: Diagnosis not present

## 2023-05-21 DIAGNOSIS — M180 Bilateral primary osteoarthritis of first carpometacarpal joints: Secondary | ICD-10-CM

## 2023-05-21 DIAGNOSIS — M1812 Unilateral primary osteoarthritis of first carpometacarpal joint, left hand: Secondary | ICD-10-CM

## 2023-05-21 DIAGNOSIS — E039 Hypothyroidism, unspecified: Secondary | ICD-10-CM

## 2023-05-21 MED ORDER — LEVOTHYROXINE SODIUM 125 MCG PO TABS
125.0000 ug | ORAL_TABLET | Freq: Every day | ORAL | 3 refills | Status: DC
Start: 2023-05-21 — End: 2023-06-29

## 2023-05-21 NOTE — Assessment & Plan Note (Signed)
TSH was low recently, we need to go ahead and decrease her levothyroxine dose again and recheck TSH in 6 weeks.

## 2023-05-21 NOTE — Progress Notes (Signed)
    Procedures performed today:    Procedure: Real-time Ultrasound Guided injection of the left first [facility] Device: Samsung HS60  Verbal informed consent obtained.  Time-out conducted.  Noted no overlying erythema, induration, or other signs of local infection.  Skin prepped in a sterile fashion.  Local anesthesia: Topical Ethyl chloride.  With sterile technique and under real time ultrasound guidance: Left first CMC noted arthritic, injected 1/2 cc lidocaine, 1/2 cc kenalog 40. Completed without difficulty  Advised to call if fevers/chills, erythema, induration, drainage, or persistent bleeding.  Images permanently stored and available for review in PACS.  Impression: Technically successful ultrasound guided injection.  Independent interpretation of notes and tests performed by another provider:   None.  Brief History, Exam, Impression, and Recommendations:    Benign essential hypertension Blood pressure now with some iatrogenic lows, she did see 1 my partners who decreased her medication from valsartan 80/12.5 to just valsartan alone. She is still experiencing significant orthostatic symptoms, they will check her blood pressures first thing in the morning when she gets up and feels symptomatic as well as sometime in the afternoon, I am going to discontinue her valsartan altogether for now.  Hypothyroidism TSH was low recently, we need to go ahead and decrease her levothyroxine dose again and recheck TSH in 6 weeks.    ____________________________________________ Ihor Austin. Benjamin Stain, M.D., ABFM., CAQSM., AME. Primary Care and Sports Medicine [facility]  Adjunct Professor of Family Medicine  [employer]  Restaurant manager, fast food

## 2023-05-21 NOTE — Assessment & Plan Note (Signed)
Blood pressure now with some iatrogenic lows, she did see 1 my partners who decreased her medication from valsartan 80/12.5 to just valsartan alone. She is still experiencing significant orthostatic symptoms, they will check her blood pressures first thing in the morning when she gets up and feels symptomatic as well as sometime in the afternoon, I am going to discontinue her valsartan altogether for now.

## 2023-05-28 DIAGNOSIS — M1812 Unilateral primary osteoarthritis of first carpometacarpal joint, left hand: Secondary | ICD-10-CM

## 2023-05-28 DIAGNOSIS — M180 Bilateral primary osteoarthritis of first carpometacarpal joints: Secondary | ICD-10-CM | POA: Diagnosis not present

## 2023-05-28 MED ORDER — TRIAMCINOLONE ACETONIDE 40 MG/ML IJ SUSP
40.0000 mg | Freq: Once | INTRAMUSCULAR | Status: AC
Start: 2023-05-28 — End: 2023-05-28
  Administered 2023-05-28: 40 mg via INTRAMUSCULAR

## 2023-05-28 NOTE — Addendum Note (Signed)
Addended by: Carren Rang A on: 05/28/2023 04:22 PM   Modules accepted: Orders

## 2023-05-29 ENCOUNTER — Other Ambulatory Visit: Payer: Self-pay | Admitting: Sports Medicine

## 2023-05-31 ENCOUNTER — Encounter: Payer: Self-pay | Admitting: Sports Medicine

## 2023-05-31 ENCOUNTER — Other Ambulatory Visit: Payer: Self-pay | Admitting: Physician Assistant

## 2023-05-31 MED ORDER — EPINEPHRINE 0.3 MG/0.3ML IJ SOAJ: 0.3000 mg | INTRAMUSCULAR | 11 refills | Status: DC | PRN

## 2023-05-31 NOTE — Telephone Encounter (Signed)
Patient requesting rx rf of epipen. Last listed under historical provider.  Last OV 05/21/2023 Upcoming appt 07/02/23

## 2023-06-01 ENCOUNTER — Encounter: Payer: Self-pay | Admitting: Physician Assistant

## 2023-06-01 DIAGNOSIS — E063 Autoimmune thyroiditis: Secondary | ICD-10-CM | POA: Insufficient documentation

## 2023-06-01 NOTE — Progress Notes (Signed)
Free T4 is just a little elevated but better than 3 months ago.  Free T3 normal range.  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 This just means your body is fighting against thyroid in some capacity.  Selenium 200mg  daily can help and trying anti-inflammatory diet.

## 2023-06-02 ENCOUNTER — Encounter: Payer: Self-pay | Admitting: Bariatrics

## 2023-06-02 ENCOUNTER — Ambulatory Visit (INDEPENDENT_AMBULATORY_CARE_PROVIDER_SITE_OTHER): Payer: Medicare HMO | Admitting: Bariatrics

## 2023-06-02 VITALS — BP 134/87 | HR 63 | Temp 98.0°F | Ht 64.0 in | Wt 216.0 lb

## 2023-06-02 DIAGNOSIS — E039 Hypothyroidism, unspecified: Secondary | ICD-10-CM | POA: Diagnosis not present

## 2023-06-02 DIAGNOSIS — Z6837 Body mass index (BMI) 37.0-37.9, adult: Secondary | ICD-10-CM

## 2023-06-02 DIAGNOSIS — I10 Essential (primary) hypertension: Secondary | ICD-10-CM | POA: Diagnosis not present

## 2023-06-02 DIAGNOSIS — E669 Obesity, unspecified: Secondary | ICD-10-CM | POA: Diagnosis not present

## 2023-06-02 NOTE — Progress Notes (Signed)
WEIGHT SUMMARY AND BIOMETRICS  Weight Lost Since Last Visit: 7lb  Weight Gained Since Last Visit: 0lb   Vitals Temp: 98 F (36.7 C) BP: 134/87 Pulse Rate: 63 SpO2: 99 %   Anthropometric Measurements Height: 5\' 4"  (1.626 m) Weight: 216 lb (98 kg) BMI (Calculated): 37.06 Weight at Last Visit: 223lb Weight Lost Since Last Visit: 7lb Weight Gained Since Last Visit: 0lb Starting Weight: 260lb Total Weight Loss (lbs): 44 lb (20 kg)   Body Composition  Body Fat %: 46.8 % Fat Mass (lbs): 101.2 lbs Muscle Mass (lbs): 109.2 lbs Total Body Water (lbs): 75.8 lbs Visceral Fat Rating : 13   Other Clinical Data Fasting: No Labs: No Today's Visit #: 12 Starting Date: 09/16/22    OBESITY Khamora is here to discuss her progress with her obesity treatment plan along with follow-up of her obesity related diagnoses.     Nutrition Plan: the Category 3 plan - 75% adherence.  Current exercise: none  Interim History:  She is down another 7 lbs and doing well overall.  Eating all of the food on the plan., Protein intake is less than prescribed., Is not skipping meals, and Water intake is adequate.  Pharmacotherapy: Cedrika is on Mounjaro 12.5 mg SQ weekly Adverse side effects: None Hunger is well controlled.  Cravings are well controlled.  Assessment/Plan:   Hypertension Hypertension control uncertain.  Medication(s): medications stopped at this time due to fluctuating levels and some hypotensive episodes.   BP Readings from Last 3 Encounters:  06/02/23 134/87  05/21/23 124/81  05/19/23 (!) 126/39   Lab Results  Component Value Date   CREATININE 0.73 05/19/2023   CREATININE 0.75 02/03/2023   CREATININE 0.71 09/16/2022    Plan: Will not resume antihypertensives at this time.  Will continue to check blood pressures at home.  Will follow-up with her PCP  Hypothyroidism Stable.  Does not report symptoms associated with uncontrolled  hypothyroidism. Medication(s): Levothyroxine 125 mcg daily ( subject to change due to low TSH ).  Lab Results  Component Value Date   TSH 0.295 (L) 05/19/2023    Plan: Continue levothyroxine at current dose. Counseling: The correct way to take levothyroxine is fasting, with water, separated by at least 30 minutes from breakfast, and separated by more than 4 hours from calcium, iron, multivitamins, acid reflux medications (PPIs).  She will follow-up with her primary next week.    Generalized Obesity: Current BMI BMI (Calculated): 37.06   Pharmacotherapy Plan Continue  Mounjaro 12.5 mg SQ weekly  Cristal is currently in the action stage of change. As such, her goal is to continue with weight loss efforts.  She has agreed to the Category 3 plan.  Exercise goals: All adults should avoid inactivity. Some physical activity is better than none, and adults who participate in any amount of physical activity gain some health benefits.  Behavioral modification strategies: increasing lean protein intake, decreasing simple carbohydrates , no meal skipping, increase water intake, better snacking choices, increasing vegetables, and mindful eating.  Leonne has agreed to follow-up with our clinic in 4 weeks.      Objective:   VITALS: Per patient if applicable, see vitals. GENERAL: Alert and in no acute distress. CARDIOPULMONARY: No increased WOB. Speaking in clear sentences.  PSYCH: Pleasant and cooperative. Speech normal rate and rhythm. Affect is appropriate. Insight and judgement are appropriate. Attention is focused, linear, and appropriate.  NEURO: Oriented as arrived to appointment on time with no prompting.   Attestation Statements:  This was prepared with the assistance of Engineer, civil (consulting).  Occasional wrong-word or sound-a-like substitutions may have occurred due to the inherent limitations of voice recognition software.   Corinna Capra, DO

## 2023-06-08 ENCOUNTER — Encounter: Payer: Self-pay | Admitting: Sports Medicine

## 2023-06-08 ENCOUNTER — Ambulatory Visit (INDEPENDENT_AMBULATORY_CARE_PROVIDER_SITE_OTHER): Payer: Medicare HMO | Admitting: Sports Medicine

## 2023-06-08 VITALS — BP 123/82 | HR 70

## 2023-06-08 DIAGNOSIS — E039 Hypothyroidism, unspecified: Secondary | ICD-10-CM

## 2023-06-08 DIAGNOSIS — H8110 Benign paroxysmal vertigo, unspecified ear: Secondary | ICD-10-CM | POA: Diagnosis not present

## 2023-06-08 NOTE — Progress Notes (Addendum)
    Procedures performed today:    None.  Independent interpretation of notes and tests performed by another provider:   None.  Brief History, Exam, Impression, and Recommendations:    Benign paroxysmal positional vertigo This pleasant 57 year old female returns, she has been having episodes of dizziness. Her blood pressures have been running somewhat soft, so we have steadily decreased her blood pressure medication, she has lost a significant amount of weight with Mounjaro. More recently her blood pressures at home have been normal off of all blood pressure medication. She still is complaining of dizziness and has presented to several providers. On further investigation her dizziness occurs when looking up or when rolling over in bed, she describes it in a way that sounds more like vertigo rather than presyncope. No chest pain, no palpitations. This typically resolved after the morning. I explained to her the anatomy and pathophysiology of benign paroxysmal positional vertigo, I would like her to do the Epley maneuver at home, she can come back to see me in about a month.  Hypothyroidism TSH remains a touch too low, we need to decrease levothyroxine again and recheck again in 6 weeks.  I spent 30 minutes of total time managing this patient today, this includes chart review, face to face, and non-face to face time.  ____________________________________________ Ihor Austin. Benjamin Stain, M.D., ABFM., CAQSM., AME. Primary Care and Sports Medicine [facility]  Adjunct Professor of Family Medicine  [employer] [facility]  Restaurant manager, fast food

## 2023-06-08 NOTE — Assessment & Plan Note (Signed)
This pleasant 57 year old female returns, she has been having episodes of dizziness. Her blood pressures have been running somewhat soft, so we have steadily decreased her blood pressure medication, she has lost a significant amount of weight with Mounjaro. More recently her blood pressures at home have been normal off of all blood pressure medication. She still is complaining of dizziness and has presented to several providers. On further investigation her dizziness occurs when looking up or when rolling over in bed, she describes it in a way that sounds more like vertigo rather than presyncope. No chest pain, no palpitations. This typically resolved after the morning. I explained to her the anatomy and pathophysiology of benign paroxysmal positional vertigo, I would like her to do the Epley maneuver at home, she can come back to see me in about a month.

## 2023-06-12 ENCOUNTER — Other Ambulatory Visit: Payer: Self-pay | Admitting: Physician Assistant

## 2023-06-12 DIAGNOSIS — I10 Essential (primary) hypertension: Secondary | ICD-10-CM

## 2023-06-23 DIAGNOSIS — Z5181 Encounter for therapeutic drug level monitoring: Secondary | ICD-10-CM | POA: Diagnosis not present

## 2023-06-23 DIAGNOSIS — M961 Postlaminectomy syndrome, not elsewhere classified: Secondary | ICD-10-CM | POA: Diagnosis not present

## 2023-06-23 DIAGNOSIS — M47816 Spondylosis without myelopathy or radiculopathy, lumbar region: Secondary | ICD-10-CM | POA: Diagnosis not present

## 2023-06-23 DIAGNOSIS — G894 Chronic pain syndrome: Secondary | ICD-10-CM | POA: Diagnosis not present

## 2023-06-23 DIAGNOSIS — M7918 Myalgia, other site: Secondary | ICD-10-CM | POA: Diagnosis not present

## 2023-06-25 ENCOUNTER — Other Ambulatory Visit: Payer: Self-pay | Admitting: Bariatrics

## 2023-06-25 DIAGNOSIS — I10 Essential (primary) hypertension: Secondary | ICD-10-CM

## 2023-06-28 DIAGNOSIS — E039 Hypothyroidism, unspecified: Secondary | ICD-10-CM | POA: Diagnosis not present

## 2023-06-29 ENCOUNTER — Encounter: Payer: Self-pay | Admitting: Sports Medicine

## 2023-06-29 ENCOUNTER — Other Ambulatory Visit: Payer: Self-pay | Admitting: Bariatrics

## 2023-06-29 DIAGNOSIS — E559 Vitamin D deficiency, unspecified: Secondary | ICD-10-CM

## 2023-06-29 LAB — TSH: TSH: 0.22 u[IU]/mL — ABNORMAL LOW (ref 0.450–4.500)

## 2023-06-29 MED ORDER — LEVOTHYROXINE SODIUM 112 MCG PO TABS
112.0000 ug | ORAL_TABLET | Freq: Every day | ORAL | 3 refills | Status: DC
Start: 2023-06-29 — End: 2023-10-25

## 2023-06-29 NOTE — Assessment & Plan Note (Signed)
TSH remains a touch too low, we need to decrease levothyroxine again and recheck again in 6 weeks.

## 2023-06-29 NOTE — Addendum Note (Signed)
Addended by: Monica Becton on: 06/29/2023 12:03 PM   Modules accepted: Orders

## 2023-07-02 ENCOUNTER — Encounter: Payer: Self-pay | Admitting: Sports Medicine

## 2023-07-02 ENCOUNTER — Ambulatory Visit (INDEPENDENT_AMBULATORY_CARE_PROVIDER_SITE_OTHER): Payer: Medicare HMO | Admitting: Sports Medicine

## 2023-07-02 VITALS — BP 122/85 | HR 69 | Ht 64.0 in | Wt 212.0 lb

## 2023-07-02 DIAGNOSIS — H8110 Benign paroxysmal vertigo, unspecified ear: Secondary | ICD-10-CM | POA: Diagnosis not present

## 2023-07-02 DIAGNOSIS — Z0000 Encounter for general adult medical examination without abnormal findings: Secondary | ICD-10-CM

## 2023-07-02 DIAGNOSIS — Z23 Encounter for immunization: Secondary | ICD-10-CM

## 2023-07-02 NOTE — Assessment & Plan Note (Signed)
Much better with Epley maneuver, continue this indefinitely for now.

## 2023-07-02 NOTE — Progress Notes (Signed)
    Procedures performed today:    None.  Independent interpretation of notes and tests performed by another provider:   None.  Brief History, Exam, Impression, and Recommendations:    Benign paroxysmal positional vertigo Much better with Epley maneuver, continue this indefinitely for now.  Annual physical exam Up-to-date on screening measures, flu shot today.    ____________________________________________ Ihor Austin. Benjamin Stain, M.D., ABFM., CAQSM., AME. Primary Care and Sports Medicine [facility]  Adjunct Professor of Family Medicine  [place] [facility]  Restaurant manager, fast food

## 2023-07-02 NOTE — Assessment & Plan Note (Addendum)
Up-to-date on screening measures, flu shot today. Return in 6 months for fasting annual physical

## 2023-07-02 NOTE — Addendum Note (Signed)
Addended by: Carren Rang A on: 07/02/2023 10:50 AM   Modules accepted: Orders

## 2023-07-06 ENCOUNTER — Encounter: Payer: Self-pay | Admitting: Sports Medicine

## 2023-07-06 ENCOUNTER — Ambulatory Visit (INDEPENDENT_AMBULATORY_CARE_PROVIDER_SITE_OTHER): Payer: Medicare HMO | Admitting: Bariatrics

## 2023-07-06 ENCOUNTER — Encounter: Payer: Self-pay | Admitting: Bariatrics

## 2023-07-06 VITALS — BP 138/85 | HR 63 | Temp 97.7°F | Ht 64.0 in | Wt 212.0 lb

## 2023-07-06 DIAGNOSIS — E119 Type 2 diabetes mellitus without complications: Secondary | ICD-10-CM

## 2023-07-06 DIAGNOSIS — Z7985 Long-term (current) use of injectable non-insulin antidiabetic drugs: Secondary | ICD-10-CM

## 2023-07-06 DIAGNOSIS — E559 Vitamin D deficiency, unspecified: Secondary | ICD-10-CM | POA: Diagnosis not present

## 2023-07-06 DIAGNOSIS — Z6836 Body mass index (BMI) 36.0-36.9, adult: Secondary | ICD-10-CM | POA: Diagnosis not present

## 2023-07-06 DIAGNOSIS — E669 Obesity, unspecified: Secondary | ICD-10-CM | POA: Diagnosis not present

## 2023-07-06 DIAGNOSIS — E063 Autoimmune thyroiditis: Secondary | ICD-10-CM

## 2023-07-06 MED ORDER — VITAMIN D (ERGOCALCIFEROL) 1.25 MG (50000 UNIT) PO CAPS
50000.0000 [IU] | ORAL_CAPSULE | ORAL | 0 refills | Status: DC
Start: 2023-07-06 — End: 2023-08-04

## 2023-07-06 NOTE — Progress Notes (Signed)
WEIGHT SUMMARY AND BIOMETRICS  Weight Lost Since Last Visit: 4lb  Vitals Temp: 97.7 F (36.5 C) BP: 138/85 Pulse Rate: 63 SpO2: 100 %   Anthropometric Measurements Height: 5\' 4"  (1.626 m) Weight: 212 lb (96.2 kg) BMI (Calculated): 36.37 Weight at Last Visit: 216lb Weight Lost Since Last Visit: 4lb Starting Weight: 260lb Total Weight Loss (lbs): 48 lb (21.8 kg)   Body Composition  Body Fat %: 45.6 % Fat Mass (lbs): 96.6 lbs Muscle Mass (lbs): 109.6 lbs Total Body Water (lbs): 74.6 lbs Visceral Fat Rating : 13   Other Clinical Data Fasting: no Labs: no Today's Visit #: 13 Starting Date: 09/16/22    OBESITY Veleda is here to discuss her progress with her obesity treatment plan along with follow-up of her obesity related diagnoses.    Nutrition Plan: the Category 3 plan - 100% adherence.  Current exercise: cardiovascular workout on exercise equipment and weightlifting  Interim History:  She is down 4 lbs since her last visit.  Eating all of the food on the plan., Protein intake is as prescribed, Is not skipping meals, Water intake is adequate., and Denies polyphagia  Pharmacotherapy: Poppy is on Mounjaro 12.5 mg SQ weekly Adverse side effects: None Hunger is well controlled.  Cravings are moderately controlled.  Assessment/Plan:   1. Vitamin D deficiency Vitamin D Deficiency Vitamin D is at goal of 50.  Most recent vitamin D level was 58.5. She is on  prescription ergocalciferol 50,000 IU weekly. Lab Results  Component Value Date   VD25OH 58.5 02/03/2023   VD25OH 26.2 (L) 09/16/2022   VD25OH 45 05/09/2020    Plan: Refill prescription vitamin D 50,000 IU weekly.   Type II Diabetes HgbA1c is at goal. Last A1c was 6.2 CBGs: Not checking      Episodes of hypoglycemia: no Medication(s): Mounjaro 12.5 mg SQ weekly  Lab Results  Component Value Date   HGBA1C 6.2 (H) 02/03/2023   HGBA1C 6.7 (A) 11/16/2022   HGBA1C 6.2 (H) 09/16/2022    Lab Results  Component Value Date   MICROALBUR 30 11/16/2022   LDLCALC 94 02/03/2023   CREATININE 0.73 05/19/2023   No results found for: "GFR"  Plan: Continue Mounjaro 12.5 mg SQ weekly Continue all other medications.  Will keep all carbohydrates low both sweets and starches.  Will continue exercise regimen to 30 to 60 minutes on most days of the week.  Aim for 7 to 9 hours of sleep nightly.  Eat more low glycemic index foods.    Generalized Obesity: Current BMI BMI (Calculated): 36.37   Pharmacotherapy Plan Continue  Mounjaro 12.5 mg SQ weekly  Jatziri is currently in the action stage of change. As such, her goal is to continue with weight loss efforts.  She has agreed to the Category 3 plan.  Exercise goals: For substantial health benefits, adults should do at least 150 minutes (2 hours and 30 minutes) a week of moderate-intensity, or 75 minutes (1 hour and 15 minutes) a week of vigorous-intensity aerobic physical activity, or an equivalent combination of moderate- and vigorous-intensity aerobic activity. Aerobic activity should be performed in episodes of at least 10 minutes, and preferably, it should be spread throughout the week.  Behavioral modification strategies: decreasing simple carbohydrates , meal planning , increase water intake, better snacking choices, planning for success, increasing vegetables, avoiding temptations, and weigh protein portions.  Sharilee has agreed to follow-up with our clinic in 4 weeks.    Objective:   VITALS: Per patient if  applicable, see vitals. GENERAL: Alert and in no acute distress. CARDIOPULMONARY: No increased WOB. Speaking in clear sentences.  PSYCH: Pleasant and cooperative. Speech normal rate and rhythm. Affect is appropriate. Insight and judgement are appropriate. Attention is focused, linear, and appropriate.  NEURO: Oriented as arrived to appointment on time with no prompting.   Attestation Statements:   This was prepared with  the assistance of Engineer, civil (consulting).  Occasional wrong-word or sound-a-like substitutions may have occurred due to the inherent limitations of voice recognition software. Corinna Capra, DO

## 2023-07-12 ENCOUNTER — Encounter: Payer: Self-pay | Admitting: Gastroenterology

## 2023-07-15 DIAGNOSIS — M533 Sacrococcygeal disorders, not elsewhere classified: Secondary | ICD-10-CM | POA: Diagnosis not present

## 2023-07-22 ENCOUNTER — Other Ambulatory Visit: Payer: Self-pay | Admitting: Sports Medicine

## 2023-07-22 DIAGNOSIS — Z1231 Encounter for screening mammogram for malignant neoplasm of breast: Secondary | ICD-10-CM

## 2023-08-04 ENCOUNTER — Encounter: Payer: Self-pay | Admitting: Bariatrics

## 2023-08-04 ENCOUNTER — Ambulatory Visit: Payer: Medicare HMO | Admitting: Bariatrics

## 2023-08-04 VITALS — BP 143/80 | HR 68 | Temp 97.5°F | Ht 64.0 in | Wt 203.0 lb

## 2023-08-04 DIAGNOSIS — E66812 Obesity, class 2: Secondary | ICD-10-CM

## 2023-08-04 DIAGNOSIS — E669 Obesity, unspecified: Secondary | ICD-10-CM | POA: Diagnosis not present

## 2023-08-04 DIAGNOSIS — Z6834 Body mass index (BMI) 34.0-34.9, adult: Secondary | ICD-10-CM | POA: Diagnosis not present

## 2023-08-04 DIAGNOSIS — E559 Vitamin D deficiency, unspecified: Secondary | ICD-10-CM

## 2023-08-04 DIAGNOSIS — Z7985 Long-term (current) use of injectable non-insulin antidiabetic drugs: Secondary | ICD-10-CM | POA: Diagnosis not present

## 2023-08-04 DIAGNOSIS — E119 Type 2 diabetes mellitus without complications: Secondary | ICD-10-CM

## 2023-08-04 MED ORDER — VITAMIN D (ERGOCALCIFEROL) 1.25 MG (50000 UNIT) PO CAPS
50000.0000 [IU] | ORAL_CAPSULE | ORAL | 0 refills | Status: DC
Start: 2023-08-04 — End: 2023-08-11

## 2023-08-04 NOTE — Progress Notes (Deleted)
   WEIGHT SUMMARY AND BIOMETRICS  Weight Lost Since Last Visit: 9lb  Weight Gained Since Last Visit: 0   Vitals Temp: (!) 97.5 F (36.4 C) BP: (!) 143/80 Pulse Rate: 68 SpO2: 100 %   Anthropometric Measurements Height: 5\' 4"  (1.626 m) Weight: 203 lb (92.1 kg) BMI (Calculated): 34.83 Weight at Last Visit: 212lb Weight Lost Since Last Visit: 9lb Weight Gained Since Last Visit: 0 Starting Weight: 260lb Total Weight Loss (lbs): 57 lb (25.9 kg)   Body Composition  Body Fat %: 45.7 % Fat Mass (lbs): 92.8 lbs Muscle Mass (lbs): 104.6 lbs Total Body Water (lbs): 73.4 lbs Visceral Fat Rating : 12   Other Clinical Data Fasting: no Labs: no Today's Visit #: 14 Starting Date: 09/16/22    OBESITY Samayah is here to discuss her progress with her obesity treatment plan along with follow-up of her obesity related diagnoses.     Nutrition Plan: the Category 3 plan - 100% adherence.  Current exercise: walking and the gym  Interim History:  *** {aabnutritionassessment:29213}  Pharmacotherapy: Tangie is on {dwwpharmacotherapy:29109} Adverse side effects: {dwwse:29122} Hunger is {EWCONTROLASSESSMENT:24261}.  Cravings are {EWCONTROLASSESSMENT:24261}.  Assessment/Plan:   1. Vitamin D deficiency ***     {dwwmorbid:29108::"Morbid Obesity"}: Current BMI BMI (Calculated): 34.83   Pharmacotherapy Plan {dwwmed:29123}  {dwwpharmacotherapy:29109}  Ahjanae {CHL AMB IS/IS NOT:210130109} currently in the action stage of change. As such, her goal is to {MWMwtloss#1:210800005}.  She has agreed to {dwwsldiets:29085}.  Exercise goals: {MWM EXERCISE RECS:23473}  Behavioral modification strategies: {dwwslwtlossstrategies:29088}.  Clydia has agreed to follow-up with our clinic in {NUMBER 1-10:22536} weeks.   No orders of the defined types were placed in this encounter.   There are no discontinued medications.   No orders of the defined types were placed in this  encounter.     Objective:   VITALS: Per patient if applicable, see vitals. GENERAL: Alert and in no acute distress. CARDIOPULMONARY: No increased WOB. Speaking in clear sentences.  PSYCH: Pleasant and cooperative. Speech normal rate and rhythm. Affect is appropriate. Insight and judgement are appropriate. Attention is focused, linear, and appropriate.  NEURO: Oriented as arrived to appointment on time with no prompting.   Attestation Statements:   ***(delete if time-based billing not used) Time spent on visit including the items listed below was *** minutes.  -preparing to see the patient (e.g., review of tests, history, previous notes) -obtaining and/or reviewing separately obtained history -counseling and educating the patient/family/caregiver -documenting clinical information in the electronic or other health record -ordering medications, tests, or procedures -independently interpreting results and communicating results to the patient/ family/caregiver -referring and communicating with other health care professionals  -care coordination   This was prepared with the assistance of Engineer, civil (consulting).  Occasional wrong-word or sound-a-like substitutions may have occurred due to the inherent limitations of voice recognition software.

## 2023-08-04 NOTE — Progress Notes (Unsigned)
WEIGHT SUMMARY AND BIOMETRICS  Weight Lost Since Last Visit: 9lb  Weight Gained Since Last Visit: 0   Vitals Temp: (!) 97.5 F (36.4 C) BP: (!) 143/80 Pulse Rate: 68 SpO2: 100 %   Anthropometric Measurements Height: 5\' 4"  (1.626 m) Weight: 203 lb (92.1 kg) BMI (Calculated): 34.83 Weight at Last Visit: 212lb Weight Lost Since Last Visit: 9lb Weight Gained Since Last Visit: 0 Starting Weight: 260lb Total Weight Loss (lbs): 57 lb (25.9 kg)   Body Composition  Body Fat %: 45.7 % Fat Mass (lbs): 92.8 lbs Muscle Mass (lbs): 104.6 lbs Total Body Water (lbs): 73.4 lbs Visceral Fat Rating : 12   Other Clinical Data Fasting: no Labs: no Today's Visit #: 14 Starting Date: 09/16/22    OBESITY Kelsey Snow is here to discuss her progress with her obesity treatment plan along with follow-up of her obesity related diagnoses.     Nutrition Plan: the Category 3 plan - 100% adherence.  Current exercise: walking and gym  She is doing her elliptical.    Interim History:  She is down another 9 lbs since her last visit. She states that she is not getting enough variety.  Eating all of the food on the plan., Protein intake is as prescribed, Is not skipping meals, and Water intake is adequate.  Pharmacotherapy: Sherill is on Mounjaro 12.5 mg SQ weekly Adverse side effects: None Hunger is moderately controlled.  Cravings are moderately controlled.  Assessment/Plan:   1. Vitamin D deficiency Vitamin D Deficiency Vitamin D is at goal of 50.  Most recent vitamin D level was 58.5. Her energy has improved.  She is on  prescription ergocalciferol 50,000 IU weekly. Lab Results  Component Value Date   VD25OH 58.5 02/03/2023   VD25OH 26.2 (L) 09/16/2022   VD25OH 45 05/09/2020    Plan: Refill prescription vitamin D 50,000 IU weekly.   2. Controlled type 2 diabetes mellitus without complication, without long-term current use of insulin (HCC) Type II Diabetes HgbA1c is at  goal. Last A1c was 6.2 Episodes of hypoglycemia: no Medication(s): Mounjaro 12.5 mg SQ weekly  Lab Results  Component Value Date   HGBA1C 6.2 (H) 02/03/2023   HGBA1C 6.7 (A) 11/16/2022   HGBA1C 6.2 (H) 09/16/2022   Lab Results  Component Value Date   MICROALBUR 30 11/16/2022   LDLCALC 94 02/03/2023   CREATININE 0.73 05/19/2023   No results found for: "GFR"  Plan: Continue Mounjaro 12.5 mg SQ weekly She wants to continue her Mounjaro until the end of the year and then is considering tapering down the dose. She will talk to Dr. Benjamin Stain.  Will keep all carbohydrates low both sweets and starches.  Will continue exercise regimen to 30 to 60 minutes on most days of the week.  Aim for 7 to 9 hours of sleep nightly.  Eat more low glycemic index foods.    Generalized Obesity: Current BMI BMI (Calculated): 34.83   Pharmacotherapy Plan Continue  Mounjaro 12.5 mg SQ weekly  Margy is currently in the action stage of change. As such, her goal is to continue with weight loss efforts.  She has agreed to the Category 3 plan.  Exercise goals: Adults should also include muscle-strengthening activities that involve all major muscle groups on 2 or more days a week.  Behavioral modification strategies: increasing lean protein intake, meal planning , planning for success, increasing vegetables, increasing lower sugar fruits, and avoiding temptations.  Caden has agreed to follow-up with our clinic in 4  weeks.     Objective:   VITALS: Per patient if applicable, see vitals. GENERAL: Alert and in no acute distress. CARDIOPULMONARY: No increased WOB. Speaking in clear sentences.  PSYCH: Pleasant and cooperative. Speech normal rate and rhythm. Affect is appropriate. Insight and judgement are appropriate. Attention is focused, linear, and appropriate.  NEURO: Oriented as arrived to appointment on time with no prompting.   Attestation Statements:   This was prepared with the assistance of  Engineer, civil (consulting).  Occasional wrong-word or sound-a-like substitutions may have occurred due to the inherent limitations of voice recognition software.   Corinna Capra, DO

## 2023-08-05 DIAGNOSIS — M47816 Spondylosis without myelopathy or radiculopathy, lumbar region: Secondary | ICD-10-CM | POA: Diagnosis not present

## 2023-08-09 ENCOUNTER — Encounter: Payer: Self-pay | Admitting: Bariatrics

## 2023-08-09 ENCOUNTER — Encounter: Payer: Self-pay | Admitting: Sports Medicine

## 2023-08-09 DIAGNOSIS — K219 Gastro-esophageal reflux disease without esophagitis: Secondary | ICD-10-CM

## 2023-08-09 MED ORDER — OMEPRAZOLE 40 MG PO CPDR
40.0000 mg | DELAYED_RELEASE_CAPSULE | Freq: Every day | ORAL | 3 refills | Status: DC
Start: 2023-08-09 — End: 2024-08-09

## 2023-08-10 ENCOUNTER — Other Ambulatory Visit: Payer: Self-pay

## 2023-08-10 DIAGNOSIS — E559 Vitamin D deficiency, unspecified: Secondary | ICD-10-CM

## 2023-08-10 DIAGNOSIS — E063 Autoimmune thyroiditis: Secondary | ICD-10-CM

## 2023-08-11 LAB — VITAMIN D 25 HYDROXY (VIT D DEFICIENCY, FRACTURES): Vit D, 25-Hydroxy: 103 ng/mL — ABNORMAL HIGH (ref 30.0–100.0)

## 2023-08-11 LAB — TSH: TSH: 1.07 u[IU]/mL (ref 0.450–4.500)

## 2023-08-11 NOTE — Addendum Note (Signed)
Addended by: Monica Becton on: 08/11/2023 10:33 AM   Modules accepted: Orders

## 2023-08-30 ENCOUNTER — Ambulatory Visit: Payer: Medicare HMO | Admitting: Sports Medicine

## 2023-09-01 ENCOUNTER — Ambulatory Visit: Payer: Medicare HMO | Admitting: Bariatrics

## 2023-09-08 ENCOUNTER — Ambulatory Visit: Payer: Medicare HMO

## 2023-09-08 DIAGNOSIS — Z1231 Encounter for screening mammogram for malignant neoplasm of breast: Secondary | ICD-10-CM | POA: Diagnosis not present

## 2023-09-15 ENCOUNTER — Ambulatory Visit
Admission: RE | Admit: 2023-09-15 | Discharge: 2023-09-15 | Disposition: A | Discharge status: Home / Self Care | Payer: Medicare HMO | Source: Ambulatory Visit | Attending: Family Medicine | Admitting: Family Medicine

## 2023-09-15 ENCOUNTER — Other Ambulatory Visit: Payer: Self-pay

## 2023-09-15 VITALS — BP 138/88 | HR 97 | Temp 98.3°F | Resp 16

## 2023-09-15 DIAGNOSIS — U071 COVID-19: Secondary | ICD-10-CM

## 2023-09-15 LAB — POC SARS CORONAVIRUS 2 AG -  ED: SARS Coronavirus 2 Ag: POSITIVE — AB

## 2023-09-15 LAB — POCT INFLUENZA A/B
Influenza A, POC: NEGATIVE
Influenza B, POC: NEGATIVE

## 2023-09-15 MED ORDER — PAXLOVID (300/100) 20 X 150 MG & 10 X 100MG PO TBPK: ORAL_TABLET | ORAL | 0 refills | Status: DC

## 2023-09-15 MED ORDER — PROMETHAZINE-DM 6.25-15 MG/5ML PO SYRP: 5.0000 mL | ORAL_SOLUTION | Freq: Four times a day (QID) | ORAL | 0 refills | Status: DC | PRN

## 2023-09-15 NOTE — ED Provider Notes (Signed)
[facility]    CSN: [id number] Arrival date & time: 09/15/23  0802      History   Chief Complaint Chief Complaint  Patient presents with   Cough    HPI MARKI BRENCHLEY is a 57 y.o. female.   HPI  Patient started with symptoms on Sunday evening.  This is the third day of illness.  She states she feels significantly achy, tired, terrible headache, sinus congestion, postnasal drip, and cough.  She states she coughs so hard that she vomits.  She is very tired.  Husband was sick last week.  Past Medical History:  Diagnosis Date   Anemia    Anxiety    Arthritis    Back pain    Carpal tunnel syndrome    Depression    Diabetes (HCC)    Essential hypertension, benign 03/07/2015   Fibromuscular dysplasia of carotid artery (HCC)    GERD (gastroesophageal reflux disease)    History of degenerative disc disease    Hx of ectopic pregnancy    Hyperlipidemia    Hypothyroidism    Joint pain    Lumbosacral spondylolysis    Obese    OCD (obsessive compulsive disorder)    Thyroid disease     Patient Active Problem List   Diagnosis Date Noted   Benign paroxysmal positional vertigo 06/08/2023   Hashimoto's thyroiditis 06/01/2023   Polyphagia 12/30/2022   Verruca left thigh 12/10/2022   Rhinitis 12/03/2022   Morbid obesity (HCC) 11/18/2022   BMI 40.0-44.9, adult (HCC) 11/18/2022   Type 2 diabetes mellitus with obesity (HCC) 11/18/2022   Hypertension associated with type 2 diabetes mellitus (HCC) 11/18/2022   Prediabetes 09/30/2022   Other fatigue 09/16/2022   SOB (shortness of breath) on exertion 09/16/2022   Elevated glucose 09/16/2022   B12 deficiency 09/16/2022   Vitamin D deficiency 09/16/2022   Absolute anemia 09/16/2022   Health care maintenance 09/16/2022   Seborrheic keratosis 08/14/2022   Benign essential hypertension 08/14/2022   Primary osteoarthritis of first carpometacarpal joint of left hand 05/21/2022   Controlled type 2 diabetes mellitus without  complication, without long-term current use of insulin (HCC) 12/24/2021   COVID-19 04/16/2021   Tinnitus 11/13/2020   Lesion of dorsum of nose 08/12/2020   Right foot injury 07/24/2020   Hemangioma 05/27/2020   Insomnia 05/09/2020   History of endometrial ablation 01/10/2020   Epigastric pain 12/07/2019   Sacroiliac joint pain 07/28/2018   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 12/16/2017   Lumbar radiculopathy 11/29/2017   Strain of peroneal tendon, left, initial encounter 05/18/2017   Chronic pain syndrome 11/26/2016   Facet arthropathy, cervical 06/05/2016   Postlaminectomy syndrome of lumbar region 05/01/2015   Carpal tunnel syndrome, left, status post right carpal tunnel release 11/29/2014   Foraminal stenosis of cervical region 05/03/2014   Low back pain 05/03/2014   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   Cervical spondylosis without myelopathy 04/13/2014   Perennial allergic rhinitis 11/16/2013   Obsessive compulsive disorder with depression and anxiety 10/10/2013   Hypothyroidism 10/10/2013   Obesity 10/10/2013   Annual physical exam 10/10/2013   Lumbar degenerative disc disease 10/10/2013   Hypercholesterolemia 05/12/2013   Esophageal reflux 03/25/2013    Past Surgical History:  Procedure Laterality Date   BACK SURGERY     L5 to S1 discectomy   ECTOPIC PREGNANCY SURGERY     ENDOMETRIAL ABLATION     ESOPHAGOGASTRODUODENOSCOPY     in 20's  had pepic ulcers    TUBAL LIGATION     TUBAL LIGATION     UPPER GASTROINTESTINAL ENDOSCOPY      OB History     Gravida  2   Para      Term      Preterm      AB      Living         SAB      IAB      Ectopic      Multiple      Live Births               Home Medications    Prior to Admission medications   Medication Sig Start Date End Date Taking? Authorizing Provider  nirmatrelvir/ritonavir (PAXLOVID, 300/100,) 20 x 150 MG & 10 x 100MG  TBPK Take as  directed 09/15/23  Yes Eustace Moore, MD  promethazine-dextromethorphan (PROMETHAZINE-DM) 6.25-15 MG/5ML syrup Take 5 mLs by mouth 4 (four) times daily as needed for cough. 09/15/23  Yes Eustace Moore, MD  aspirin 81 MG EC tablet TAKE 1 TABLET (81 MG TOTAL) BY MOUTH DAILY. 08/22/15   Monica Becton, MD  Cyanocobalamin (VITAMIN B 12 PO) Take 1 tablet by mouth every other day.    [provider]  EPINEPHrine 0.3 mg/0.3 mL IJ SOAJ injection Inject 0.3 mg into the muscle as needed for anaphylaxis. 05/31/23   Monica Becton, MD  fluticasone (FLONASE) 50 MCG/ACT nasal spray Place 2 sprays into both nostrils daily. 12/04/22   Monica Becton, MD  levothyroxine (SYNTHROID) 112 MCG tablet Take 1 tablet (112 mcg total) by mouth daily before breakfast. 06/29/23   Monica Becton, MD  omeprazole (PRILOSEC) 40 MG capsule Take 1 capsule (40 mg total) by mouth daily. 08/09/23   Monica Becton, MD  rosuvastatin (CRESTOR) 10 MG tablet TAKE 1 TABLET BY MOUTH DAILY 05/31/23   Monica Becton, MD  tirzepatide [facility]) 12.5 MG/0.5ML Pen Inject 12.5 mg into the skin once a week. 05/11/23   Monica Becton, MD  traMADol (ULTRAM-ER) 200 MG 24 hr tablet Take 200 mg by mouth daily.    [provider]    Family History Family History  Problem Relation Age of Onset   Depression Mother    Pancreatic cancer Mother    Hypothyroidism Mother    Varicose Veins Mother    Colon polyps Mother    High blood pressure Mother    Aneurysm Father    AAA (abdominal aortic aneurysm) Father    Sudden death Father    Anxiety disorder Father    Drug abuse Sister    Hyperlipidemia Brother    Diabetes Maternal Grandmother    Alcohol abuse Maternal Uncle    Esophageal cancer Neg Hx    Colon cancer Neg Hx    Rectal cancer Neg Hx    Stomach cancer Neg Hx     Social History Social History   Tobacco Use   Smoking status: Never    Passive exposure: Never    Smokeless tobacco: Never  Vaping Use   Vaping status: Never Used  Substance Use Topics   Alcohol use: Yes    Alcohol/week: 6.0 standard drinks of alcohol    Types: 6 Standard drinks or equivalent per week    Comment: weekly   Drug use: No     Allergies   Bee venom and Topiramate   Review of Systems Review of Systems   Physical Exam Triage  Vital Signs ED Triage Vitals  Encounter Vitals Group     BP 09/15/23 0807 138/88     Systolic BP Percentile --      Diastolic BP Percentile --      Pulse Rate 09/15/23 0807 97     Resp 09/15/23 0807 16     Temp 09/15/23 0807 98.3 F (36.8 C)     Temp src --      SpO2 09/15/23 0807 99 %     Weight --      Height --      Head Circumference --      Peak Flow --      Pain Score 09/15/23 0810 10     Pain Loc --      Pain Education --      Exclude from Growth Chart --    No data found.  Updated Vital Signs BP 138/88   Pulse 97   Temp 98.3 F (36.8 C)   Resp 16   LMP 02/26/2014   SpO2 99%      Physical Exam Constitutional:      General: She is not in acute distress.    Appearance: She is well-developed. She is ill-appearing.  HENT:     Head: Normocephalic and atraumatic.     Right Ear: Tympanic membrane and ear canal normal.     Left Ear: Tympanic membrane and ear canal normal.     Nose: Congestion present.     Mouth/Throat:     Pharynx: No posterior oropharyngeal erythema.  Eyes:     Conjunctiva/sclera: Conjunctivae normal.     Pupils: Pupils are equal, round, and reactive to light.  Cardiovascular:     Rate and Rhythm: Normal rate and regular rhythm.  Pulmonary:     Effort: Pulmonary effort is normal. No respiratory distress.     Breath sounds: Normal breath sounds.  Abdominal:     General: There is no distension.     Palpations: Abdomen is soft.  Musculoskeletal:        General: Normal range of motion.     Cervical back: Normal range of motion.  Skin:    General: Skin is warm and dry.  Neurological:      Mental Status: She is alert.      UC Treatments / Results  Labs (all labs ordered are listed, but only abnormal results are displayed) Labs Reviewed  POC SARS CORONAVIRUS 2 AG -  ED - Abnormal; Notable for the following components:      Result Value   SARS Coronavirus 2 Ag Positive (*)    All other components within normal limits  POCT INFLUENZA A/B    EKG   Radiology No results found.  Procedures Procedures (including critical care time)  Medications Ordered in UC Medications - No data to display  Initial Impression / Assessment and Plan / UC Course  I have reviewed the triage vital signs and the nursing notes.  Pertinent labs & imaging results that were available during my care of the patient were reviewed by me and considered in my medical decision making (see chart for details).     Discussed management of COVID at home. Final Clinical Impressions(s) / UC Diagnoses   Final diagnoses:  COVID-19     Discharge Instructions      Lots of fluids Get plenty of rest I have prescribed Phenergan DM is a cough medicine.  This will also stop the vomiting Take Paxlovid 2 times a day for  5 days Quarantine at home until the end of the week, then wear a mask.  You should mask for 10 days (after start of illness) Call for problems   ED Prescriptions     Medication Sig Dispense Auth. Provider   nirmatrelvir/ritonavir (PAXLOVID, 300/100,) 20 x 150 MG & 10 x 100MG  TBPK Take as directed 1 each Eustace Moore, MD   promethazine-dextromethorphan (PROMETHAZINE-DM) 6.25-15 MG/5ML syrup Take 5 mLs by mouth 4 (four) times daily as needed for cough. 118 mL Eustace Moore, MD      PDMP not reviewed this encounter.   Eustace Moore, MD 09/15/23 [phone]

## 2023-09-15 NOTE — ED Triage Notes (Addendum)
Has c/o cough since Sunday, has vomiting, headache, sinus congestion, body aches. No fevers.

## 2023-09-15 NOTE — Discharge Instructions (Addendum)
Lots of fluids Get plenty of rest I have prescribed Phenergan DM is a cough medicine.  This will also stop the vomiting Take Paxlovid 2 times a day for 5 days Quarantine at home until the end of the week, then wear a mask.  You should mask for 10 days (after start of illness) Call for problems

## 2023-09-21 ENCOUNTER — Other Ambulatory Visit: Payer: Self-pay | Admitting: Sports Medicine

## 2023-09-21 DIAGNOSIS — J310 Chronic rhinitis: Secondary | ICD-10-CM

## 2023-09-27 ENCOUNTER — Encounter: Payer: Self-pay | Admitting: Bariatrics

## 2023-09-27 ENCOUNTER — Ambulatory Visit (INDEPENDENT_AMBULATORY_CARE_PROVIDER_SITE_OTHER): Payer: Medicare HMO | Admitting: Bariatrics

## 2023-09-27 VITALS — BP 110/74 | HR 68 | Temp 98.0°F | Ht 64.0 in | Wt 197.0 lb

## 2023-09-27 DIAGNOSIS — E119 Type 2 diabetes mellitus without complications: Secondary | ICD-10-CM

## 2023-09-27 DIAGNOSIS — Z6833 Body mass index (BMI) 33.0-33.9, adult: Secondary | ICD-10-CM

## 2023-09-27 DIAGNOSIS — E66811 Obesity, class 1: Secondary | ICD-10-CM

## 2023-09-27 DIAGNOSIS — Z7985 Long-term (current) use of injectable non-insulin antidiabetic drugs: Secondary | ICD-10-CM

## 2023-09-27 NOTE — Progress Notes (Signed)
WEIGHT SUMMARY AND BIOMETRICS  Weight Lost Since Last Visit: 6lb  Weight Gained Since Last Visit: 0   Vitals Temp: 98 F (36.7 C) BP: 110/74 Pulse Rate: 68 SpO2: 98 %   Anthropometric Measurements Height: 5\' 4"  (1.626 m) Weight: 197 lb (89.4 kg) BMI (Calculated): 33.8 Weight at Last Visit: 203lb Weight Lost Since Last Visit: 6lb Weight Gained Since Last Visit: 0 Starting Weight: 260lb Total Weight Loss (lbs): 63 lb (28.6 kg)   Body Composition  Body Fat %: 45.8 % Fat Mass (lbs): 90.6 lbs Muscle Mass (lbs): 101.8 lbs Total Body Water (lbs): 74.6 lbs Visceral Fat Rating : 12   Other Clinical Data Fasting: no Labs: no Today's Visit #: 15 Starting Date: 09/16/22    OBESITY Peytyn is here to discuss her progress with her obesity treatment plan along with follow-up of her obesity related diagnoses.    Nutrition Plan: the Category 3 plan - 0% adherence.  Current exercise: none  Interim History:  She is down another 6 lbs. Her goal is 180 lbs at this time.  Eating all of the food on the plan., Protein intake is as prescribed, Is skipping meals, Water intake is adequate., Denies polyphagia, and Denies excessive cravings.   Pharmacotherapy: Dwayne is on Mounjaro 12.5 mg SQ weekly Adverse side effects: None Hunger is moderately controlled.  Cravings are moderately controlled.    Assessment/Plan:   Type II Diabetes HgbA1c is at goal. Last A1c was 6.2 Episodes of hypoglycemia: no Medication(s): Mounjaro 12.5 mg SQ weekly  Lab Results  Component Value Date   HGBA1C 6.2 (H) 02/03/2023   HGBA1C 6.7 (A) 11/16/2022   HGBA1C 6.2 (H) 09/16/2022   Lab Results  Component Value Date   MICROALBUR 30 11/16/2022   LDLCALC 94 02/03/2023   CREATININE 0.73 05/19/2023   No results found for: "GFR"  Plan: Continue Mounjaro 12.5 mg SQ weekly Will keep  all carbohydrates low both sweets and starches.  Will continue exercise regimen to 30 to 60 minutes on most days of the week.  Aim for 7 to 9 hours of sleep nightly.  Eat more low glycemic index foods.      Generalized Obesity: Current BMI BMI (Calculated): 33.8   Pharmacotherapy Plan Continue  Mounjaro 12.5 mg SQ weekly  Chaylin is currently in the action stage of change. As such, her goal is to continue with weight loss efforts.  She has agreed to the Category 3 plan.  Exercise goals: For substantial health benefits, adults should do at least 150 minutes (2 hours and 30 minutes) a week of moderate-intensity, or 75 minutes (1 hour and 15 minutes) a week of vigorous-intensity aerobic physical activity, or an equivalent combination of moderate- and vigorous-intensity aerobic activity. Aerobic activity should be performed in episodes of at least 10 minutes, and preferably, it should be spread throughout the week. She is  going to resume her exercises.   Behavioral modification strategies: increasing lean protein intake, decreasing simple carbohydrates , no meal skipping, meal planning , increase water intake, better snacking choices, planning for success, emotional eating strategies, avoiding temptations, keep healthy foods in the home, keep a strict food journal, increase frequency of journaling, and mindful eating.  Justa has agreed to follow-up with our clinic in 4 weeks.       Objective:   VITALS: Per patient if applicable, see vitals. GENERAL: Alert and in no acute distress. CARDIOPULMONARY: No increased WOB. Speaking in clear sentences.  PSYCH: Pleasant and cooperative. Speech normal rate and rhythm. Affect is appropriate. Insight and judgement are appropriate. Attention is focused, linear, and appropriate.  NEURO: Oriented as arrived to appointment on time with no prompting.   Attestation Statements:   This was prepared with the assistance of Engineer, civil (consulting).  Occasional  wrong-word or sound-a-like substitutions may have occurred due to the inherent limitations of voice recognition   Corinna Capra, DO

## 2023-09-29 DIAGNOSIS — M961 Postlaminectomy syndrome, not elsewhere classified: Secondary | ICD-10-CM | POA: Diagnosis not present

## 2023-09-29 DIAGNOSIS — M533 Sacrococcygeal disorders, not elsewhere classified: Secondary | ICD-10-CM | POA: Diagnosis not present

## 2023-09-29 DIAGNOSIS — G894 Chronic pain syndrome: Secondary | ICD-10-CM | POA: Diagnosis not present

## 2023-09-29 DIAGNOSIS — M47816 Spondylosis without myelopathy or radiculopathy, lumbar region: Secondary | ICD-10-CM | POA: Diagnosis not present

## 2023-09-29 DIAGNOSIS — M5451 Vertebrogenic low back pain: Secondary | ICD-10-CM | POA: Diagnosis not present

## 2023-10-04 ENCOUNTER — Ambulatory Visit: Payer: Self-pay | Admitting: Sports Medicine

## 2023-10-04 ENCOUNTER — Ambulatory Visit
Admission: EM | Admit: 2023-10-04 | Discharge: 2023-10-04 | Disposition: A | Discharge status: Home / Self Care | Payer: Medicare HMO | Attending: Family Medicine | Admitting: Family Medicine

## 2023-10-04 DIAGNOSIS — K130 Diseases of lips: Secondary | ICD-10-CM

## 2023-10-04 MED ORDER — METHYLPREDNISOLONE SODIUM SUCC 125 MG IJ SOLR
80.0000 mg | Freq: Once | INTRAMUSCULAR | Status: AC
  Administered 2023-10-04: 80 mg via INTRAMUSCULAR

## 2023-10-04 MED ORDER — CEPHALEXIN 500 MG PO CAPS: 500.0000 mg | ORAL_CAPSULE | Freq: Two times a day (BID) | ORAL | 0 refills | Status: DC

## 2023-10-04 NOTE — ED Provider Notes (Signed)
[facility]    CSN: [id number] Arrival date & time: 10/04/23  1600      History   Chief Complaint Chief Complaint  Patient presents with   Oral Swelling    Lip    HPI Kel[facility] Snow is a 57 y.o. female.   Patient's had progressively worsening chapped lips, swelling of lips, pain of lips over the last several days.  She has been using a variety of products.  She is here because her lower lip is swollen, painful, her lymph glands have become swollen, she has yellow crusting across the lower lip.  Is worried about infection.  No difficulty with breathing or swallowing    Past Medical History:  Diagnosis Date   Anemia    Anxiety    Arthritis    Back pain    Carpal tunnel syndrome    Depression    Diabetes (HCC)    Essential hypertension, benign 03/07/2015   Fibromuscular dysplasia of carotid artery (HCC)    GERD (gastroesophageal reflux disease)    History of degenerative disc disease    Hx of ectopic pregnancy    Hyperlipidemia    Hypothyroidism    Joint pain    Lumbosacral spondylolysis    Obese    OCD (obsessive compulsive disorder)    Thyroid disease     Patient Active Problem List   Diagnosis Date Noted   Benign paroxysmal positional vertigo 06/08/2023   Hashimoto's thyroiditis 06/01/2023   Verruca left thigh 12/10/2022   Rhinitis 12/03/2022   BMI 40.0-44.9, adult (HCC) 11/18/2022   Type 2 diabetes mellitus with obesity (HCC) 11/18/2022   Hypertension associated with type 2 diabetes mellitus (HCC) 11/18/2022   Other fatigue 09/16/2022   SOB (shortness of breath) on exertion 09/16/2022   B12 deficiency 09/16/2022   Vitamin D deficiency 09/16/2022   Absolute anemia 09/16/2022   Seborrheic keratosis 08/14/2022   Benign essential hypertension 08/14/2022   Primary osteoarthritis of first carpometacarpal joint of left hand 05/21/2022   Controlled type 2 diabetes mellitus without complication, without long-term current use of insulin (HCC)  12/24/2021   COVID-19 04/16/2021   Tinnitus 11/13/2020   Lesion of dorsum of nose 08/12/2020   Hemangioma 05/27/2020   Insomnia 05/09/2020   History of endometrial ablation 01/10/2020   Epigastric pain 12/07/2019   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 12/16/2017   Lumbar radiculopathy 11/29/2017   Chronic pain syndrome 11/26/2016   Facet arthropathy, cervical 06/05/2016   Postlaminectomy syndrome of lumbar region 05/01/2015   Carpal tunnel syndrome, left, status post right carpal tunnel release 11/29/2014   Foraminal stenosis of cervical region 05/03/2014   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   Cervical spondylosis without myelopathy 04/13/2014   Perennial allergic rhinitis 11/16/2013   Obsessive compulsive disorder with depression and anxiety 10/10/2013   Hypothyroidism 10/10/2013   Annual physical exam 10/10/2013   Lumbar degenerative disc disease 10/10/2013   Hypercholesterolemia 05/12/2013   Esophageal reflux 03/25/2013    Past Surgical History:  Procedure Laterality Date   BACK SURGERY     L5 to S1 discectomy   ECTOPIC PREGNANCY SURGERY     ENDOMETRIAL ABLATION     ESOPHAGOGASTRODUODENOSCOPY     in 20's had pepic ulcers    TUBAL LIGATION     TUBAL LIGATION     UPPER GASTROINTESTINAL ENDOSCOPY      OB History     Gravida  2   Para  Term      Preterm      AB      Living         SAB      IAB      Ectopic      Multiple      Live Births               Home Medications    Prior to Admission medications   Medication Sig Start Date End Date Taking? Authorizing Provider  cephALEXin (KEFLEX) 500 MG capsule Take 1 capsule (500 mg total) by mouth 2 (two) times daily. 10/04/23  Yes Eustace Moore, MD  aspirin 81 MG EC tablet TAKE 1 TABLET (81 MG TOTAL) BY MOUTH DAILY. 08/22/15   Monica Becton, MD  Cyanocobalamin (VITAMIN B 12 PO) Take 1 tablet by mouth every other day.    [provider]  EPINEPHrine 0.3 mg/0.3 mL IJ SOAJ injection Inject 0.3 mg into the muscle as needed for anaphylaxis. 05/31/23   Monica Becton, MD  fluticasone [facility]) 50 MCG/ACT nasal spray SPRAY 2 SPRAYS IN [facility] NOSTRIL DAILY 09/21/23   Monica Becton, MD  levothyroxine (SYNTHROID) 112 MCG tablet Take 1 tablet (112 mcg total) by mouth daily before breakfast. 06/29/23   Monica Becton, MD  omeprazole (PRILOSEC) 40 MG capsule Take 1 capsule (40 mg total) by mouth daily. 08/09/23   Monica Becton, MD  rosuvastatin (CRESTOR) 10 MG tablet TAKE 1 TABLET BY MOUTH DAILY 05/31/23   Monica Becton, MD  tirzepatide [facility]) 12.5 MG/0.5ML Pen Inject 12.5 mg into the skin once a week. 05/11/23   Monica Becton, MD  traMADol (ULTRAM-ER) 200 MG 24 hr tablet Take 200 mg by mouth daily.    [provider]    Family History Family History  Problem Relation Age of Onset   Depression Mother    Pancreatic cancer Mother    Hypothyroidism Mother    Varicose Veins Mother    Colon polyps Mother    High blood pressure Mother    Aneurysm Father    AAA (abdominal aortic aneurysm) Father    Sudden death Father    Anxiety disorder Father    Drug abuse Sister    Hyperlipidemia Brother    Diabetes Maternal Grandmother    Alcohol abuse Maternal Uncle    Esophageal cancer Neg Hx    Colon cancer Neg Hx    Rectal cancer Neg Hx    Stomach cancer Neg Hx     Social History Social History   Tobacco Use   Smoking status: Never    Passive exposure: Never   Smokeless tobacco: Never  Vaping Use   Vaping status: Never Used  Substance Use Topics   Alcohol use: Yes    Alcohol/week: 6.0 standard drinks of alcohol    Types: 6 Standard drinks or equivalent per week    Comment: weekly   Drug use: No     Allergies   Bee venom and Topiramate   Review of Systems Review of Systems See HPI  Physical Exam Triage Vital Signs ED Triage Vitals  Encounter  Vitals Group     BP 10/04/23 1621 (!) 141/85     Systolic BP Percentile --      Diastolic BP Percentile --      Pulse Rate 10/04/23 1621 67     Resp 10/04/23 1621 16     Temp 10/04/23 1621 98.2 F (36.8 C)  Temp Source 10/04/23 1621 Oral     SpO2 10/04/23 1621 99 %     Weight --      Height --      Head Circumference --      Peak Flow --      Pain Score 10/04/23 1623 3     Pain Loc --      Pain Education --      Exclude from Growth Chart --    No data found.  Updated Vital Signs BP (!) 141/85 (BP Location: Right Arm)   Pulse 67   Temp 98.2 F (36.8 C) (Oral)   Resp 16   LMP 02/26/2014   SpO2 99%      Physical Exam Constitutional:      General: She is not in acute distress.    Appearance: She is well-developed. She is obese.  HENT:     Head: Normocephalic and atraumatic.     Mouth/Throat:     Comments: Upper lip appears normal.  Lower lip is diffusely swollen.  There is cracking, yellow crusting, tenderness to palpation.  Mild erythema of surrounding skin below the vermilion border Eyes:     Conjunctiva/sclera: Conjunctivae normal.     Pupils: Pupils are equal, round, and reactive to light.  Cardiovascular:     Rate and Rhythm: Normal rate.  Pulmonary:     Effort: Pulmonary effort is normal. No respiratory distress.  Abdominal:     General: There is no distension.     Palpations: Abdomen is soft.  Musculoskeletal:        General: Normal range of motion.     Cervical back: Normal range of motion.  Skin:    General: Skin is warm and dry.  Neurological:     Mental Status: She is alert.      UC Treatments / Results  Labs (all labs ordered are listed, but only abnormal results are displayed) Labs Reviewed - No data to display  EKG   Radiology No results found.  Procedures Procedures (including critical care time)  Medications Ordered in UC Medications  methylPREDNISolone sodium succinate (SOLU-MEDROL) 125 mg/2 mL injection 80 mg (80 mg  Intramuscular Given 10/04/23 1700)    Initial Impression / Assessment and Plan / UC Course  I have reviewed the triage vital signs and the nursing notes.  Pertinent labs & imaging results that were available during my care of the patient were reviewed by me and considered in my medical decision making (see chart for details).     I feel like this is a case of bad chapped lips with superficial infection.  The crusting looks like an impetigo. Final Clinical Impressions(s) / UC Diagnoses   Final diagnoses:  Dermatosis of lip due to infection     Discharge Instructions      Use cool compresses to lip for 10 minutes 4 times a day Take the antibiotic as instructed until lip swelling goes away.  May stop early if you desire Use Vaseline only on your lips, or bland lip product (not Blistex) while they are irritated   ED Prescriptions     Medication Sig Dispense Auth. Provider   cephALEXin (KEFLEX) 500 MG capsule Take 1 capsule (500 mg total) by mouth 2 (two) times daily. 10 capsule Eustace Moore, MD      PDMP not reviewed this encounter.   Eustace Moore, MD 10/04/23 [phone]

## 2023-10-04 NOTE — Discharge Instructions (Signed)
Use cool compresses to lip for 10 minutes 4 times a day Take the antibiotic as instructed until lip swelling goes away.  May stop early if you desire Use Vaseline only on your lips, or bland lip product (not Blistex) while they are irritated

## 2023-10-04 NOTE — Telephone Encounter (Signed)
  Chief Complaint: Lip swelling Symptoms: lip swelling since Friday with patient endorsing gland swelling since this morning Frequency: Since Friday Pertinent Negatives: Patient denies CP, SOB, sore throat Disposition: [] ED /[x] Urgent Care (no appt availability in office) / [] Appointment(In office/virtual)/ []  [facility]/ [] Home Care/ [] Refused Recommended Disposition /[] [facility]/ []  Follow-up with PCP Additional Notes: patient calling with complaints of lip swelling since Friday. Patient is unsure of what caused it. Reports taking doses of benadryl with improvement in symptoms. Patient is requesting an appointment. Unable to make an appointment at [facility] care visit is the recommendation at this time. Patient states she will speak with her husband and then head over to the closest Urgent Care. All questions answered.    Copied from CRM [id number]. Topic: Clinical - Red Word Triage >> Oct 04, 2023  3:13 PM Alcus Dad H wrote: Red Word that prompted transfer to Nurse Triage: swelling of the lips and glands, since Friday Reason for Disposition  [1] Looks infected AND [2] large red area (> 2 in. or 5 cm)  Answer Assessment - Initial Assessment Questions 1. ONSET: "When did the swelling start?" (e.g., minutes, hours, days)     Last Friday 2. SEVERITY: "How swollen is it?"     Patient states lips look like she has gotten filler in her lips 3. ITCHING: "Is there any itching?" If Yes, ask: "How much?"   (Scale 1-10; mild, moderate or severe)     no 4. PAIN: "Is the swelling painful to touch?" If Yes, ask: "How painful is it?"   (Scale 1-10; mild, moderate or severe)     4 out of 10-able to eat 5. CAUSE: "What do you think is causing the lip swelling?"     Not sure what caused 6. RECURRENT SYMPTOM: "Have you had lip swelling before?" If Yes, ask: "When was the last time?" "What happened that time?"     "Not since I went sking." 7. OTHER SYMPTOMS: "Do you have any other  symptoms?" (e.g., toothache)     no  Protocols used: Lip Swelling-A-AH

## 2023-10-04 NOTE — ED Triage Notes (Signed)
Pt c/o lower lip swelling sine Friday. Says they started super chapped, woke up Sat very swollen. Benedryl prn. Also says glands in neck feel swollen causing headache. Denies fever.

## 2023-10-05 ENCOUNTER — Encounter: Payer: Self-pay | Admitting: Sports Medicine

## 2023-10-05 NOTE — Telephone Encounter (Signed)
Patient seen at urgent care yesterday 10/04/23.

## 2023-10-06 ENCOUNTER — Ambulatory Visit
Admission: EM | Admit: 2023-10-06 | Discharge: 2023-10-06 | Disposition: A | Discharge status: Home / Self Care | Payer: Medicare HMO | Attending: Family Medicine | Admitting: Family Medicine

## 2023-10-06 ENCOUNTER — Other Ambulatory Visit: Payer: Self-pay

## 2023-10-06 DIAGNOSIS — E785 Hyperlipidemia, unspecified: Secondary | ICD-10-CM | POA: Diagnosis not present

## 2023-10-06 DIAGNOSIS — K130 Diseases of lips: Secondary | ICD-10-CM

## 2023-10-06 DIAGNOSIS — R220 Localized swelling, mass and lump, head: Secondary | ICD-10-CM | POA: Insufficient documentation

## 2023-10-06 DIAGNOSIS — E669 Obesity, unspecified: Secondary | ICD-10-CM | POA: Insufficient documentation

## 2023-10-06 DIAGNOSIS — E039 Hypothyroidism, unspecified: Secondary | ICD-10-CM | POA: Diagnosis not present

## 2023-10-06 DIAGNOSIS — I10 Essential (primary) hypertension: Secondary | ICD-10-CM | POA: Insufficient documentation

## 2023-10-06 DIAGNOSIS — J029 Acute pharyngitis, unspecified: Secondary | ICD-10-CM | POA: Insufficient documentation

## 2023-10-06 LAB — POCT RAPID STREP A (OFFICE): Rapid Strep A Screen: NEGATIVE

## 2023-10-06 MED ORDER — AMOXICILLIN-POT CLAVULANATE 875-125 MG PO TABS: 1.0000 | ORAL_TABLET | Freq: Two times a day (BID) | ORAL | 0 refills | Status: DC

## 2023-10-06 MED ORDER — PREDNISONE 20 MG PO TABS: ORAL_TABLET | ORAL | 0 refills | Status: DC

## 2023-10-06 NOTE — ED Provider Notes (Signed)
[facility]    CSN: [id number] Arrival date & time: 10/06/23  0906      History   Chief Complaint Chief Complaint  Patient presents with   Sore Throat    HPI Kelsey Snow is a 57 y.o. female.   HPI pleasant 57 year old female presents with continued lip swelling and gland swelling since Friday, 10/01/2023 patient was evaluated here on Monday, 10/04/2023 and prescribed Keflex patient returns with worsening of lips.  PMH significant for obesity, HTN, hypothyroidism, and HLD.  Past Medical History:  Diagnosis Date   Anemia    Anxiety    Arthritis    Back pain    Carpal tunnel syndrome    Depression    Diabetes (HCC)    Essential hypertension, benign 03/07/2015   Fibromuscular dysplasia of carotid artery (HCC)    GERD (gastroesophageal reflux disease)    History of degenerative disc disease    Hx of ectopic pregnancy    Hyperlipidemia    Hypothyroidism    Joint pain    Lumbosacral spondylolysis    Obese    OCD (obsessive compulsive disorder)    Thyroid disease     Patient Active Problem List   Diagnosis Date Noted   Benign paroxysmal positional vertigo 06/08/2023   Hashimoto's thyroiditis 06/01/2023   Verruca left thigh 12/10/2022   Rhinitis 12/03/2022   BMI 40.0-44.9, adult (HCC) 11/18/2022   Type 2 diabetes mellitus with obesity (HCC) 11/18/2022   Hypertension associated with type 2 diabetes mellitus (HCC) 11/18/2022   Other fatigue 09/16/2022   SOB (shortness of breath) on exertion 09/16/2022   B12 deficiency 09/16/2022   Vitamin D deficiency 09/16/2022   Absolute anemia 09/16/2022   Seborrheic keratosis 08/14/2022   Benign essential hypertension 08/14/2022   Primary osteoarthritis of first carpometacarpal joint of left hand 05/21/2022   Controlled type 2 diabetes mellitus without complication, without long-term current use of insulin (HCC) 12/24/2021   COVID-19 04/16/2021   Tinnitus 11/13/2020   Lesion of dorsum of nose 08/12/2020    Hemangioma 05/27/2020   Insomnia 05/09/2020   History of endometrial ablation 01/10/2020   Epigastric pain 12/07/2019   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 12/16/2017   Lumbar radiculopathy 11/29/2017   Chronic pain syndrome 11/26/2016   Facet arthropathy, cervical 06/05/2016   Postlaminectomy syndrome of lumbar region 05/01/2015   Carpal tunnel syndrome, left, status post right carpal tunnel release 11/29/2014   Foraminal stenosis of cervical region 05/03/2014   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   Cervical spondylosis without myelopathy 04/13/2014   Perennial allergic rhinitis 11/16/2013   Obsessive compulsive disorder with depression and anxiety 10/10/2013   Hypothyroidism 10/10/2013   Annual physical exam 10/10/2013   Lumbar degenerative disc disease 10/10/2013   Hypercholesterolemia 05/12/2013   Esophageal reflux 03/25/2013    Past Surgical History:  Procedure Laterality Date   BACK SURGERY     L5 to S1 discectomy   ECTOPIC PREGNANCY SURGERY     ENDOMETRIAL ABLATION     ESOPHAGOGASTRODUODENOSCOPY     in 20's had pepic ulcers    TUBAL LIGATION     TUBAL LIGATION     UPPER GASTROINTESTINAL ENDOSCOPY      OB History     Gravida  2   Para      Term      Preterm      AB      Living  SAB      IAB      Ectopic      Multiple      Live Births               Home Medications    Prior to Admission medications   Medication Sig Start Date End Date Taking? Authorizing Provider  amoxicillin-clavulanate (AUGMENTIN) 875-125 MG tablet Take 1 tablet by mouth every 12 (twelve) hours. 10/06/23  Yes Trevor Iha, FNP  predniSONE (DELTASONE) 20 MG tablet Take 3 tabs PO daily x 5 days. 10/06/23  Yes Trevor Iha, FNP  aspirin 81 MG EC tablet TAKE 1 TABLET (81 MG TOTAL) BY MOUTH DAILY. 08/22/15   Monica Becton, MD  Cyanocobalamin (VITAMIN B 12 PO) Take 1 tablet by mouth every other day.     [provider]  EPINEPHrine 0.3 mg/0.3 mL IJ SOAJ injection Inject 0.3 mg into the muscle as needed for anaphylaxis. 05/31/23   Monica Becton, MD  fluticasone [facility]) 50 MCG/ACT nasal spray SPRAY 2 SPRAYS IN [facility] NOSTRIL DAILY 09/21/23   Monica Becton, MD  levothyroxine (SYNTHROID) 112 MCG tablet Take 1 tablet (112 mcg total) by mouth daily before breakfast. 06/29/23   Monica Becton, MD  omeprazole (PRILOSEC) 40 MG capsule Take 1 capsule (40 mg total) by mouth daily. 08/09/23   Monica Becton, MD  rosuvastatin (CRESTOR) 10 MG tablet TAKE 1 TABLET BY MOUTH DAILY 05/31/23   Monica Becton, MD  tirzepatide [facility]) 12.5 MG/0.5ML Pen Inject 12.5 mg into the skin once a week. 05/11/23   Monica Becton, MD  traMADol (ULTRAM-ER) 200 MG 24 hr tablet Take 200 mg by mouth daily.    [provider]    Family History Family History  Problem Relation Age of Onset   Depression Mother    Pancreatic cancer Mother    Hypothyroidism Mother    Varicose Veins Mother    Colon polyps Mother    High blood pressure Mother    Aneurysm Father    AAA (abdominal aortic aneurysm) Father    Sudden death Father    Anxiety disorder Father    Drug abuse Sister    Hyperlipidemia Brother    Diabetes Maternal Grandmother    Alcohol abuse Maternal Uncle    Esophageal cancer Neg Hx    Colon cancer Neg Hx    Rectal cancer Neg Hx    Stomach cancer Neg Hx     Social History Social History   Tobacco Use   Smoking status: Never    Passive exposure: Never   Smokeless tobacco: Never  Vaping Use   Vaping status: Never Used  Substance Use Topics   Alcohol use: Yes    Alcohol/week: 6.0 standard drinks of alcohol    Types: 6 Standard drinks or equivalent per week    Comment: weekly   Drug use: No     Allergies   Bee venom and Topiramate   Review of Systems Review of Systems  HENT:         Continued chapped lip swelling and glands of face  for 6 days  All other systems reviewed and are negative.    Physical Exam Triage Vital Signs ED Triage Vitals [10/06/23 0920]  Encounter Vitals Group     BP 115/75     Systolic BP Percentile      Diastolic BP Percentile      Pulse Rate 66     Resp 17  Temp 98.1 F (36.7 C)     Temp Source Oral     SpO2 97 %     Weight      Height      Head Circumference      Peak Flow      Pain Score 1     Pain Loc      Pain Education      Exclude from Growth Chart    No data found.  Updated Vital Signs BP 115/75 (BP Location: Right Arm)   Pulse 66   Temp 98.1 F (36.7 C) (Oral)   Resp 17   LMP 02/26/2014   SpO2 97%    Physical Exam Vitals and nursing note reviewed.  Constitutional:      Appearance: Normal appearance. She is obese.  HENT:     Head: Normocephalic and atraumatic.     Mouth/Throat:     Mouth: Mucous membranes are moist.     Pharynx: Oropharynx is clear.  Eyes:     Extraocular Movements: Extraocular movements intact.     Conjunctiva/sclera: Conjunctivae normal.     Pupils: Pupils are equal, round, and reactive to light.  Cardiovascular:     Rate and Rhythm: Normal rate and regular rhythm.     Pulses: Normal pulses.     Heart sounds: Normal heart sounds.  Pulmonary:     Effort: Pulmonary effort is normal.     Breath sounds: Normal breath sounds. No wheezing, rhonchi or rales.  Musculoskeletal:        General: Normal range of motion.     Cervical back: Normal range of motion and neck supple.  Skin:    General: Skin is warm and dry.  Neurological:     General: No focal deficit present.     Mental Status: She is alert and oriented to person, place, and time.  Psychiatric:        Mood and Affect: Mood normal.        Behavior: Behavior normal.        Thought Content: Thought content normal.      UC Treatments / Results  Labs (all labs ordered are listed, but only abnormal results are displayed) Labs Reviewed  CULTURE, GROUP A STREP [facility])  POCT  RAPID STREP A (OFFICE)    EKG   Radiology No results found.  Procedures Procedures (including critical care time)  Medications Ordered in UC Medications - No data to display  Initial Impression / Assessment and Plan / UC Course  I have reviewed the triage vital signs and the nursing notes.  Pertinent labs & imaging results that were available during my care of the patient were reviewed by me and considered in my medical decision making (see chart for details).     MDM: 1.  Pharyngitis, unspecified etiology-rapid strep negative, throat culture ordered, Rx'd Augmentin 875/125 mg tablet: Take 1 tablet twice daily x 7 days; 2.  Chapped lips-Rx'd prednisone 20 mg tablet: Take 3 tabs p.o. daily x 5 days. Advised patient to discontinue Keflex now.  Advised patient to take medications as directed with food to completion.  Advised patient to take prednisone with first dose of Augmentin for the next 5 of 7 days.  Encouraged to increase daily water intake to 64 ounces per day while taking these medications.  Advised patient to avoid putting lip balm and/or other OTC ointments or creams on lips for the next 3 days.  Advised if symptoms worsen and/or unresolved please follow-up with  PCP, dermatology (contact information provided with his AVS), or here for further evaluation.  Final Clinical Impressions(s) / UC Diagnoses   Final diagnoses:  Pharyngitis, unspecified etiology  Chapped lips     Discharge Instructions      Advised patient to discontinue Keflex now.  Advised patient to take medications as directed with food to completion.  Advised patient to take prednisone with first dose of Augmentin for the next 5 of 7 days.  Encouraged to increase daily water intake to 64 ounces per day while taking these medications.  Advised patient to avoid putting lip balm and/or other OTC ointments or creams on lips for the next 3 days.  Advised if symptoms worsen and/or unresolved please follow-up with PCP,  dermatology, or here for further evaluation.     ED Prescriptions     Medication Sig Dispense Auth. Provider   amoxicillin-clavulanate (AUGMENTIN) 875-125 MG tablet Take 1 tablet by mouth every 12 (twelve) hours. 14 tablet Trevor Iha, FNP   predniSONE (DELTASONE) 20 MG tablet Take 3 tabs PO daily x 5 days. 15 tablet Trevor Iha, FNP      PDMP not reviewed this encounter.   Trevor Iha, FNP 10/06/23 [phone]

## 2023-10-06 NOTE — ED Triage Notes (Signed)
Pt c/o continued lip swelling and gland swelling since Friday. Was seen Mon, rx'd Keflex. No improvement.

## 2023-10-06 NOTE — Discharge Instructions (Addendum)
Advised patient to discontinue Keflex now.  Advised patient to take medications as directed with food to completion.  Advised patient to take prednisone with first dose of Augmentin for the next 5 of 7 days.  Encouraged to increase daily water intake to 64 ounces per day while taking these medications.  Advised patient to avoid putting lip balm and/or other OTC ointments or creams on lips for the next 3 days.  Advised if symptoms worsen and/or unresolved please follow-up with PCP, dermatology, or here for further evaluation.

## 2023-10-08 LAB — CULTURE, GROUP A STREP (THRC)

## 2023-10-09 ENCOUNTER — Encounter: Payer: Self-pay | Admitting: Sports Medicine

## 2023-10-15 ENCOUNTER — Ambulatory Visit (INDEPENDENT_AMBULATORY_CARE_PROVIDER_SITE_OTHER): Payer: Medicare HMO | Admitting: Sports Medicine

## 2023-10-15 VITALS — BP 131/77 | HR 65 | Ht 64.0 in | Wt 202.0 lb

## 2023-10-15 DIAGNOSIS — K130 Diseases of lips: Secondary | ICD-10-CM

## 2023-10-15 DIAGNOSIS — E119 Type 2 diabetes mellitus without complications: Secondary | ICD-10-CM

## 2023-10-15 DIAGNOSIS — E1169 Type 2 diabetes mellitus with other specified complication: Secondary | ICD-10-CM | POA: Diagnosis not present

## 2023-10-15 DIAGNOSIS — E669 Obesity, unspecified: Secondary | ICD-10-CM

## 2023-10-15 DIAGNOSIS — E539 Vitamin B deficiency, unspecified: Secondary | ICD-10-CM | POA: Diagnosis not present

## 2023-10-15 DIAGNOSIS — E063 Autoimmune thyroiditis: Secondary | ICD-10-CM | POA: Diagnosis not present

## 2023-10-15 LAB — POCT GLYCOSYLATED HEMOGLOBIN (HGB A1C): Hemoglobin A1C: 5.6 % (ref 4.0–5.6)

## 2023-10-15 LAB — POCT UA - MICROALBUMIN
Albumin/Creatinine Ratio, Urine, POC: 30
Creatinine, POC: 200 mg/dL
Microalbumin Ur, POC: 30 mg/L

## 2023-10-15 NOTE — Assessment & Plan Note (Addendum)
Excellent weight loss and A1c control on Mounjaro, currently on 12.5 mg. Weight loss has plateaued, she was given the option of increase to 15 mg, she will let me know. Urine microalbumin creatinine ratio was normal today.

## 2023-10-15 NOTE — Assessment & Plan Note (Signed)
Recent onset lip peeling, angular cheilitis. Suspect viral process, this is starting to get better. The cold dry air is likely exacerbating the process. She will apply emollients frequently, and we will watch this for now. In addition with her weight loss I would like to ensure that were not dealing with an impending vitamin deficiency, riboflavin deficiency has been associated with angular cheilitis so we will check this.

## 2023-10-15 NOTE — Progress Notes (Signed)
    Procedures performed today:    None.  Independent interpretation of notes and tests performed by another provider:   None.  Brief History, Exam, Impression, and Recommendations:    Cheilitis/stomatitis Recent onset lip peeling, angular cheilitis. Suspect viral process, this is starting to get better. The cold dry air is likely exacerbating the process. She will apply emollients frequently, and we will watch this for now. In addition with her weight loss I would like to ensure that were not dealing with an impending vitamin deficiency, riboflavin deficiency has been associated with angular cheilitis so we will check this.  Type 2 diabetes mellitus with obesity (HCC) Excellent weight loss and A1c control on Mounjaro, currently on 12.5 mg. Weight loss has plateaued, she was given the option of increase to 15 mg, she will let me know. Urine microalbumin creatinine ratio was normal today.    ____________________________________________ Ihor Austin. Benjamin Stain, M.D., ABFM., CAQSM., AME. Primary Care and Sports Medicine [facility]  Adjunct Professor of Family Medicine  [place] [employer]  Restaurant manager, fast food

## 2023-10-17 ENCOUNTER — Encounter: Payer: Self-pay | Admitting: Sports Medicine

## 2023-10-23 ENCOUNTER — Other Ambulatory Visit: Payer: Self-pay | Admitting: Sports Medicine

## 2023-10-23 DIAGNOSIS — E039 Hypothyroidism, unspecified: Secondary | ICD-10-CM

## 2023-10-25 ENCOUNTER — Ambulatory Visit (INDEPENDENT_AMBULATORY_CARE_PROVIDER_SITE_OTHER): Payer: Medicare HMO | Admitting: Sports Medicine

## 2023-10-25 ENCOUNTER — Other Ambulatory Visit (INDEPENDENT_AMBULATORY_CARE_PROVIDER_SITE_OTHER): Payer: Medicare HMO

## 2023-10-25 DIAGNOSIS — M1812 Unilateral primary osteoarthritis of first carpometacarpal joint, left hand: Secondary | ICD-10-CM

## 2023-10-25 LAB — T3, FREE: T3, Free: 3.1 pg/mL (ref 2.0–4.4)

## 2023-10-25 LAB — T4, FREE: Free T4: 1.54 ng/dL (ref 0.82–1.77)

## 2023-10-25 LAB — VITAMIN B12: Vitamin B-12: 1575 pg/mL — ABNORMAL HIGH (ref 232–1245)

## 2023-10-25 LAB — VITAMIN B3
Nicotinamide: 7 ng/mL (ref 5.2–72.1)
Nicotinic Acid: <5 ng/mL (ref 0.0–5.0)

## 2023-10-25 LAB — TSH: TSH: 2.81 u[IU]/mL (ref 0.450–4.500)

## 2023-10-25 LAB — VITAMIN B1: Thiamine: 111.1 nmol/L (ref 66.5–200.0)

## 2023-10-25 LAB — VITAMIN B2, WHOLE BLOOD: Vitamin B2, Whole Blood: 279 ug/L (ref 137–370)

## 2023-10-25 LAB — VITAMIN B6: Vitamin B6: 12.5 ug/L (ref 3.4–65.2)

## 2023-10-25 NOTE — Assessment & Plan Note (Signed)
 Very pleasant 58 year old female, known carpometacarpal osteoarthritis, left-sided, we last injected her in February 2024, she had good relief until now, repeat left first [facility] injection, return as needed.

## 2023-10-25 NOTE — Progress Notes (Signed)
    Procedures performed today:    Procedure: Real-time Ultrasound Guided injection of the left first [facility] Device: Samsung HS60  Verbal informed consent obtained.  Time-out conducted.  Noted no overlying erythema, induration, or other signs of local infection.  Skin prepped in a sterile fashion.  Local anesthesia: Topical Ethyl chloride.  With sterile technique and under real time ultrasound guidance: Arthritic joint noted, 0.5 cc lidocaine, 0.5 cc kenalog  40 injected easily Completed without difficulty  Advised to call if fevers/chills, erythema, induration, drainage, or persistent bleeding.  Images permanently stored and available for review in PACS.  Impression: Technically successful ultrasound guided injection.  Independent interpretation of notes and tests performed by another provider:   None.  Brief History, Exam, Impression, and Recommendations:    Primary osteoarthritis of first carpometacarpal joint of left hand Very pleasant 58 year old female, known carpometacarpal osteoarthritis, left-sided, we last injected her in February 2024, she had good relief until now, repeat left first [facility] injection, return as needed.    ____________________________________________ Debby PARAS. Curtis, M.D., ABFM., CAQSM., AME. Primary Care and Sports Medicine [facility]  Adjunct Professor of [facility] Medicine  [employer]  Restaurant Manager, [employer]

## 2023-10-27 ENCOUNTER — Ambulatory Visit: Payer: Medicare HMO | Admitting: Bariatrics

## 2023-10-27 ENCOUNTER — Encounter: Payer: Self-pay | Admitting: Bariatrics

## 2023-10-27 VITALS — BP 137/77 | HR 58 | Temp 97.5°F | Ht 64.0 in | Wt 194.0 lb

## 2023-10-27 DIAGNOSIS — E66811 Obesity, class 1: Secondary | ICD-10-CM

## 2023-10-27 DIAGNOSIS — E559 Vitamin D deficiency, unspecified: Secondary | ICD-10-CM

## 2023-10-27 DIAGNOSIS — Z7985 Long-term (current) use of injectable non-insulin antidiabetic drugs: Secondary | ICD-10-CM

## 2023-10-27 DIAGNOSIS — Z6833 Body mass index (BMI) 33.0-33.9, adult: Secondary | ICD-10-CM | POA: Diagnosis not present

## 2023-10-27 DIAGNOSIS — E669 Obesity, unspecified: Secondary | ICD-10-CM | POA: Diagnosis not present

## 2023-10-27 DIAGNOSIS — E119 Type 2 diabetes mellitus without complications: Secondary | ICD-10-CM | POA: Diagnosis not present

## 2023-10-27 NOTE — Progress Notes (Signed)
 WEIGHT SUMMARY AND BIOMETRICS  Weight Lost Since Last Visit: 3lb  Weight Gained Since Last Visit: 0   Vitals Temp: (!) 97.5 F (36.4 C) BP: 137/77 Pulse Rate: (!) 58 SpO2: 99 %   Anthropometric Measurements Height: 5' 4 (1.626 m) Weight: 194 lb (88 kg) BMI (Calculated): 33.28 Weight at Last Visit: 197lb Weight Lost Since Last Visit: 3lb Weight Gained Since Last Visit: 0 Starting Weight: 260lb Total Weight Loss (lbs): 66 lb (29.9 kg)   Body Composition  Body Fat %: 44 % Fat Mass (lbs): 85.6 lbs Muscle Mass (lbs): 103.4 lbs Total Body Water (lbs): 71.2 lbs Visceral Fat Rating : 12   Other Clinical Data Fasting: no Labs: no Today's Visit #: 16 Starting Date: 09/16/22    OBESITY Kelsey Snow is here to discuss her progress with her obesity treatment plan along with follow-up of her obesity related diagnoses.    Nutrition Plan: the Category 3 plan - 50% adherence.  Current exercise:  Goes to the gym  Interim History:  She is down another 3 lbs since her last visit.  Protein intake is as prescribed, Is not drinking sugar sweetened beverages., Water intake is adequate., and Denies polyphagia   Pharmacotherapy: Kelsey Snow is on Mounjaro  12.5 mg SQ weekly Adverse side effects: None Hunger is moderately controlled.  Cravings are moderately controlled.  Assessment/Plan:   Type II Diabetes HgbA1c is at goal. Last A1c was 5.6 CBGs: Not checking      Episodes of hypoglycemia: no Medication(s): Mounjaro  12.5 mg SQ weekly  Lab Results  Component Value Date   HGBA1C 5.6 10/15/2023   HGBA1C 6.2 (H) 02/03/2023   HGBA1C 6.7 (A) 11/16/2022   Lab Results  Component Value Date   MICROALBUR 30 10/15/2023   LDLCALC 94 02/03/2023   CREATININE 0.73 05/19/2023   No results found for: GFR  Plan: Continue Mounjaro  12.5 mg SQ weekly Continue all other  medications.  Will keep all carbohydrates low both sweets and starches.  Will continue exercise regimen to 30 to 60 minutes on most days of the week.  She will get back to the basics (water, and protein).  Eat more low glycemic index foods.   Vitamin D  Deficiency Vitamin D  is at goal of 50.  Most recent vitamin D  level was 103 and she stopped the prescription vitamin D  . She is on OTC vitamin D3 2000 IU daily. Lab Results  Component Value Date   VD25OH 103.0 (H) 08/10/2023   VD25OH 58.5 02/03/2023   VD25OH 26.2 (L) 09/16/2022    Plan: Continue OTC vitamin D .  Will recheck labs in March.     Generalized Obesity: Current BMI BMI (Calculated): 33.28   Pharmacotherapy Plan Continue  Mounjaro  12.5 mg SQ weekly  Kelsey Snow is currently in the action stage of change. As such, her goal is to continue with weight loss efforts.  She  has agreed to the Category 3 plan.  Exercise goals: All adults should avoid inactivity. Some physical activity is better than none, and adults who participate in any amount of physical activity gain some health benefits.  Behavioral modification strategies: no meal skipping, meal planning , increase water intake, better snacking choices, planning for success, increasing vegetables, avoiding temptations, keep healthy foods in the home, and mindful eating.  Kelsey Snow has agreed to follow-up with our clinic in 4 weeks.   No orders of the defined types were placed in this encounter.   Medications Discontinued During This Encounter  Medication Reason   amoxicillin -clavulanate (AUGMENTIN ) 875-125 MG tablet Patient Preference   Cyanocobalamin  (VITAMIN B 12 PO) Patient Preference   predniSONE  (DELTASONE ) 20 MG tablet Patient Preference     No orders of the defined types were placed in this encounter.     Objective:   VITALS: Per patient if applicable, see vitals. GENERAL: Alert and in no acute distress. CARDIOPULMONARY: No increased WOB. Speaking in clear  sentences.  PSYCH: Pleasant and cooperative. Speech normal rate and rhythm. Affect is appropriate. Insight and judgement are appropriate. Attention is focused, linear, and appropriate.  NEURO: Oriented as arrived to appointment on time with no prompting.   Attestation Statements:    This was prepared with the assistance of Engineer, Civil (consulting).  Occasional wrong-word or sound-a-like substitutions may have occurred due to the inherent limitations of voice recognition   Clayborne Daring, DO

## 2023-11-04 ENCOUNTER — Encounter: Payer: Self-pay | Admitting: Medical-Surgical

## 2023-11-04 ENCOUNTER — Telehealth: Payer: Medicare HMO | Admitting: Medical-Surgical

## 2023-11-04 DIAGNOSIS — B9789 Other viral agents as the cause of diseases classified elsewhere: Secondary | ICD-10-CM

## 2023-11-04 DIAGNOSIS — J988 Other specified respiratory disorders: Secondary | ICD-10-CM

## 2023-11-04 MED ORDER — BENZONATATE 100 MG PO CAPS
100.0000 mg | ORAL_CAPSULE | Freq: Three times a day (TID) | ORAL | 0 refills | Status: DC | PRN
Start: 2023-11-04 — End: 2023-12-10

## 2023-11-04 NOTE — Progress Notes (Signed)
Virtual Visit via Video Note  I connected with Kelsey Snow on 11/05/23 at 10:50 AM EST by a video enabled telemedicine application and verified that I am speaking with the correct person using two identifiers.   I discussed the limitations of evaluation and management by telemedicine and the availability of in person appointments. The patient expressed understanding and agreed to proceed.  Patient location: home Provider locations: office  Subjective:    CC: respiratory symptoms  HPI: Pleasant 58 year old female presenting via MyChart video visit with complaints of 3-4 days of URI symptoms including cough productive of clear sputum, lymph nodes enlarged in neck, fatigue, and sore throat. Endorses have issues with bad allergies. Has been taking Robitussin, Elderberry, and Tylenol cold/flu with moderate benefit. Has not COVID tested. Reports not wanting to come to the office because of all the illness that is going on right now.   Past medical history, Surgical history, Family history not pertinant except as noted below, Social history, Allergies, and medications have been entered into the medical record, reviewed, and corrections made.   Review of Systems: See HPI for pertinent positives and negatives.   Objective:    General: Speaking clearly in complete sentences without any shortness of breath.  Alert and oriented x3.  Normal judgment. No apparent acute distress.  Impression and Recommendations:    1. Viral respiratory illness (Primary) Symptoms consistent with a viral URI. Discussed prevalence of viral illnesses. No way to know without testing but suspect RSV. Reviewed recommendations for symptomatic treatment. Also discussed the expected timeline for resolution. Adding Tessalon perls for cough. List of OTC meds sent via MyChart message. If no better by Monday, advised her to reach out.   I discussed the assessment and treatment plan with the patient. The patient was provided an  opportunity to ask questions and all were answered. The patient agreed with the plan and demonstrated an understanding of the instructions.   The patient was advised to call back or seek an in-person evaluation if the symptoms worsen or if the condition fails to improve as anticipated.  Return if symptoms worsen or fail to improve.  Thayer Ohm, DNP, APRN, FNP-BC [facility]

## 2023-11-05 ENCOUNTER — Encounter: Payer: Self-pay | Admitting: Medical-Surgical

## 2023-11-08 ENCOUNTER — Encounter: Payer: Self-pay | Admitting: Medical-Surgical

## 2023-11-08 ENCOUNTER — Encounter (INDEPENDENT_AMBULATORY_CARE_PROVIDER_SITE_OTHER): Payer: Self-pay | Admitting: Sports Medicine

## 2023-11-08 DIAGNOSIS — J310 Chronic rhinitis: Secondary | ICD-10-CM

## 2023-11-08 MED ORDER — METHYLPREDNISOLONE 4 MG PO TBPK: ORAL_TABLET | ORAL | 0 refills | Status: DC

## 2023-11-08 MED ORDER — AZITHROMYCIN 250 MG PO TABS: ORAL_TABLET | ORAL | 0 refills | Status: AC

## 2023-11-08 NOTE — Telephone Encounter (Signed)

## 2023-11-10 ENCOUNTER — Ambulatory Visit: Payer: Self-pay | Admitting: Sports Medicine

## 2023-11-10 ENCOUNTER — Ambulatory Visit: Payer: Medicare HMO | Admitting: Sports Medicine

## 2023-11-10 VITALS — BP 161/86 | HR 75 | Temp 97.5°F

## 2023-11-10 DIAGNOSIS — J310 Chronic rhinitis: Secondary | ICD-10-CM

## 2023-11-10 NOTE — Telephone Encounter (Signed)
2nd attempt to contact pt, rang once then to voicemail. LVM for call back to assess further.  Per pt chart, pt was seen by Dr. Karie Schwalbe today in office. No triage needed.

## 2023-11-10 NOTE — Assessment & Plan Note (Signed)
Pleasant 58 year old female, about 1-1/2 weeks of runny nose, pressure sensation in both ears, coughing, malaise. She was seen by 1 my partners, due to duration of symptoms she was given prednisone and azithromycin. She is only done this for about a day. On exam she appears well, she is sniffling, otherwise no accessory muscle use, nasal flaring, speaking full sentences. Oropharynx, nasopharynx, ear canals are unremarkable. Lungs are completely clear. I think were dealing with a viral URI with cough. I explained to her that the antibiotics would take care of any bacterial component of her infection and that the viral component would get better on its own.

## 2023-11-10 NOTE — Telephone Encounter (Signed)
Copied from CRM [id number]. Topic: Clinical - Pink Word Triage >> Nov 10, 2023  8:14 AM [name] Nephew wrote: Patient calling to schedule an appointment with Dr.T for some time next week. Patient would like to preemptively schedule and cancel later if feeling better. Patient states has been sick since last Sunday (as of 11/10/2023) but has not gotten better. States she has medicine she has just started taking but symptoms are worsening. Prompted to deny scheduling and send message to triage. Patient requesting call back to schedule.  Triage Nurse 1st attempt to call pt, no ringing, went straight to voicemail. LVM for call back to triage and connect with CAL for Dr. Karie Schwalbe appt or offer scheduling with another provider. Placed in call back.

## 2023-11-10 NOTE — Progress Notes (Signed)
    Procedures performed today:    None.  Independent interpretation of notes and tests performed by another provider:   None.  Brief History, Exam, Impression, and Recommendations:    Rhinitis Pleasant 58 year old female, about 1-1/2 weeks of runny nose, pressure sensation in both ears, coughing, malaise. She was seen by 1 my partners, due to duration of symptoms she was given prednisone and azithromycin. She is only done this for about a day. On exam she appears well, she is sniffling, otherwise no accessory muscle use, nasal flaring, speaking full sentences. Oropharynx, nasopharynx, ear canals are unremarkable. Lungs are completely clear. I think were dealing with a viral URI with cough. I explained to her that the antibiotics would take care of any bacterial component of her infection and that the viral component would get better on its own.    ____________________________________________ Ihor Austin. Benjamin Stain, M.D., ABFM., CAQSM., AME. Primary Care and Sports Medicine [facility]  Adjunct Professor of Family Medicine  [place] [employer]  Restaurant manager, fast food

## 2023-12-01 ENCOUNTER — Ambulatory Visit: Payer: Medicare HMO | Admitting: Bariatrics

## 2023-12-03 ENCOUNTER — Other Ambulatory Visit: Payer: Self-pay | Admitting: Sports Medicine

## 2023-12-08 ENCOUNTER — Ambulatory Visit: Payer: Medicare HMO | Admitting: Bariatrics

## 2023-12-10 ENCOUNTER — Encounter: Payer: Self-pay | Admitting: Internal Medicine

## 2023-12-10 ENCOUNTER — Ambulatory Visit: Payer: Medicare HMO | Admitting: Internal Medicine

## 2023-12-10 ENCOUNTER — Other Ambulatory Visit: Payer: Self-pay

## 2023-12-10 VITALS — BP 150/90 | HR 65 | Temp 98.3°F | Resp 18 | Ht 64.0 in | Wt 197.7 lb

## 2023-12-10 DIAGNOSIS — H1010 Acute atopic conjunctivitis, unspecified eye: Secondary | ICD-10-CM

## 2023-12-10 DIAGNOSIS — J302 Other seasonal allergic rhinitis: Secondary | ICD-10-CM

## 2023-12-10 DIAGNOSIS — Z91038 Other insect allergy status: Secondary | ICD-10-CM

## 2023-12-10 DIAGNOSIS — R030 Elevated blood-pressure reading, without diagnosis of hypertension: Secondary | ICD-10-CM

## 2023-12-10 DIAGNOSIS — J3089 Other allergic rhinitis: Secondary | ICD-10-CM

## 2023-12-10 DIAGNOSIS — K219 Gastro-esophageal reflux disease without esophagitis: Secondary | ICD-10-CM

## 2023-12-10 DIAGNOSIS — H1013 Acute atopic conjunctivitis, bilateral: Secondary | ICD-10-CM

## 2023-12-10 MED ORDER — IPRATROPIUM BROMIDE 0.06 % NA SOLN: 2.0000 | Freq: Four times a day (QID) | NASAL | 12 refills | Status: AC

## 2023-12-10 MED ORDER — CROMOLYN SODIUM 4 % OP SOLN: 1.0000 [drp] | Freq: Four times a day (QID) | OPHTHALMIC | 12 refills | Status: DC

## 2023-12-10 MED ORDER — AZELASTINE HCL 0.1 % NA SOLN: 2.0000 | Freq: Two times a day (BID) | NASAL | 12 refills | Status: AC

## 2023-12-10 NOTE — Patient Instructions (Addendum)
Chronic Rhinitis Seasonal and Perennial Allergic: - allergy testing today: Grass, weed, tree, dust mite, cockroach  - Prevention:  - allergen avoidance when possible Start allergy injections.  Via Lamar Benes Had a detailed discussion with patient/family that clinical history is suggestive of allergic rhinitis, and may benefit from allergy immunotherapy (AIT). Discussed in detail regarding the dosing, schedule, side effects (mild to moderate local allergic reaction and rarely systemic allergic reactions including anaphylaxis/death), alternatives and benefits (significant improvement in nasal symptoms, seasonal flares of asthma) of immunotherapy with the patient. There is significant time commitment involved with allergy shots, which includes weekly immunotherapy injections for first 9-12 months and then biweekly to monthly injections for 3-5 years. Clinical response is often delayed and patient may not see an improvement for 6-12 months. Consent was signed. I have prescribed epinephrine injectable and demonstrated proper use. For mild symptoms you can take over the counter antihistamines such as Benadryl and monitor symptoms closely. If symptoms worsen or if you have severe symptoms including breathing issues, throat closure, significant swelling, whole body hives, severe diarrhea and vomiting, lightheadedness then inject epinephrine and seek immediate medical care afterwards. Action plan given.   - Symptom control: - Continue Nasal Steroid Spray: Best results if used daily. - Options include Flonase (fluticasone), Nasocort (triamcinolone), Nasonex (mometasome) 1- 2 sprays in each nostril daily.  - All can be purchased over-the-counter if not covered by insurance. - Start Astelin (azelastine) 1-2 sprays in each nostril twice a day as needed for nasal congestion/itchy nose - Start Atrovent (Ipratropium Bromide) 1-2 sprays in each nostril up to 3 times a day as needed for runny nose/post nasal drip/drainage.    - Use less frequently if airway gets too dry. - Continue Antihistamine: daily or daily as needed.   -Options include Zyrtec (Cetirizine) 10mg , Claritin (Loratadine) 10mg , Allegra (Fexofenadine) 180mg , or Xyzal (Levocetirinze) 5mg  - Can be purchased over-the-counter if not covered by insurance. -Limit antihistamines with decongestants in them as this can contribute to high blood pressure  Allergic Conjunctivitis:  - Start Allergy Eye drops-Cromolyn-1 drop each eye up to 4 times daily as needed and Rewetting Drops such as Systane,TheraTears, etc  -Avoid eye drops that say red eye relief as they may contain medications that dry out your eyes.  Gastroesophageal Reflux Disease Controlled with omeprazole. -Continue omeprazole as prescribed.  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 Carries EpiPen as a precaution. -Continue carrying EpiPen. -No need for repeat allergy shots at this time.  Follow up: In [facility] office for [place] immunotherapy  Thank you so much for letting me partake in your care today.  Don't hesitate to reach out if you have any additional concerns!  Ferol Luz, MD  [facility] [place], [place]  Reducing Pollen Exposure  The American Academy of Allergy, Asthma and Immunology suggests the following steps to reduce your exposure to pollen during allergy seasons.    Do not hang sheets or clothing out to dry; pollen may collect on these items. Do not mow lawns or spend time around freshly cut grass; mowing stirs up pollen. Keep windows closed at night.  Keep car windows closed while driving. Minimize morning activities outdoors, a time when pollen counts are usually at their highest. Stay indoors as much as possible when pollen counts or humidity is high and on windy days when pollen tends to remain in the air longer. Use air conditioning when possible.  Many  air conditioners  have filters that trap the pollen spores. Use a HEPA room air filter to remove pollen form the indoor air you breathe.  DUST MITE AVOIDANCE MEASURES:  There are three main measures that need and can be taken to avoid house dust mites:  Reduce accumulation of dust in general -reduce furniture, clothing, carpeting, books, stuffed animals, especially in bedroom  Separate yourself from the dust -use pillow and mattress encasements (can be found at stores such as [employer] or online) -avoid direct exposure to air condition flow -use a HEPA filter device, especially in the bedroom; you can also use a HEPA filter vacuum cleaner -wipe dust with a moist towel instead of a dry towel or broom when cleaning  Decrease mites and/or their secretions -wash clothing and linen and stuffed animals at highest temperature possible, at least every 2 weeks -stuffed animals can also be placed in a bag and put in a freezer overnight  Despite the above measures, it is impossible to eliminate dust mites or their allergen completely from your home.  With the above measures the burden of mites in your home can be diminished, with the goal of minimizing your allergic symptoms.  Success will be reached only when implementing and using all means together.  Control of Cockroach Allergen  Cockroach allergen has been identified as an important cause of acute attacks of asthma, especially in urban settings.  There are fifty-five species of cockroach that exist in the [place], however only three, the [place], [place] species produce allergen that can affect patients with Asthma.  Allergens can be obtained from fecal particles, egg casings and secretions from cockroaches.    Remove food sources. Reduce access to water. Seal access and entry points. Spray runways with 0.5-1% Diazinon or Chlorpyrifos Blow boric acid power under stoves and refrigerator. Place bait stations (hydramethylnon) at  feeding sites.

## 2023-12-10 NOTE — Progress Notes (Signed)
NEW PATIENT Date of Service/Encounter:  12/10/23 Referring provider: Monica Becton Primary care provider: Monica Becton, MD  Subjective:  Kelsey Snow is a 58 y.o. female  presenting today for evaluation of chronic rhinitis  History obtained from: chart review and patient and husband  .   Discussed the use of AI scribe software for clinical note transcription with the patient, who gave verbal consent to proceed.  History of Present Illness   Kelsey Snow is a 58 year old female with allergic rhinitis who presents with worsening upper airway symptoms. She was referred by Dr. Karie Schwalbe for evaluation of her chronic allergy symptoms.  She has a history of allergic rhinitis with symptoms worsening over the past three years, including rhinorrhea, nasal congestion, and ocular pruritus, particularly during the spring when tree pollen is prevalent. Her symptoms have become persistent, necessitating almost continuous use of nasal spray. She describes her nose as 'always running' and notes difficulty performing daily activities without medication.  She has been using Flonase and intermittently taking Allegra D or Zyrtec D since February of the previous year, requiring some form of allergy medication weekly. She has not used antibiotics or oral steroids for these symptoms. No history of asthma or recurrent bronchitis. She is on omeprazole for heartburn, which is well-controlled without breakthrough symptoms.  She has a history of bee sting allergy, having undergone immunotherapy through high school. She carries an EpiPen due to a significant reaction to multiple yellow jacket stings two to three years ago, which caused burning and prolonged urticaria but no anaphylaxis.  She mentions a past prescription for topiramate for anxiety and depression, which she discontinued due to adverse effects.      She has GERD which she takes omeprazole for and is well-controlled.   Other allergy  screening: Asthma: no Rhino conjunctivitis: yes Food allergy: no Medication allergy: no Hymenoptera allergy: yes hymenoptera  Urticaria: no Eczema:no History of recurrent infections suggestive of immunodeficency: no Vaccinations are up to date.   Past Medical History: Past Medical History:  Diagnosis Date   Anemia    Anxiety    Arthritis    Back pain    Carpal tunnel syndrome    Depression    Diabetes (HCC)    Essential hypertension, benign 03/07/2015   Fibromuscular dysplasia of carotid artery (HCC)    GERD (gastroesophageal reflux disease)    History of degenerative disc disease    Hx of ectopic pregnancy    Hyperlipidemia    Hypothyroidism    Joint pain    Lumbosacral spondylolysis    Obese    OCD (obsessive compulsive disorder)    Thyroid disease    Medication List:  Current Outpatient Medications  Medication Sig Dispense Refill   aspirin 81 MG EC tablet TAKE 1 TABLET (81 MG TOTAL) BY MOUTH DAILY. 90 tablet 3   azelastine (ASTELIN) 0.1 % nasal spray Place 2 sprays into both nostrils 2 (two) times daily. Use in each nostril as directed 30 mL 12   cromolyn (OPTICROM) 4 % ophthalmic solution Place 1 drop into both eyes 4 (four) times daily. 10 mL 12   EPINEPHrine 0.3 mg/0.3 mL IJ SOAJ injection Inject 0.3 mg into the muscle as needed for anaphylaxis. 1 each 11   fluticasone (FLONASE) 50 MCG/ACT nasal spray SPRAY 2 SPRAYS IN EACH NOSTRIL DAILY 16 mL 11   ipratropium (ATROVENT) 0.06 % nasal spray Place 2 sprays into both nostrils 4 (four) times daily. 15 mL 12  levothyroxine (SYNTHROID) 112 MCG tablet TAKE 1 TABLET BY MOUTH DAILY BEFORE BREAKFAST 90 tablet 3   omeprazole (PRILOSEC) 40 MG capsule Take 1 capsule (40 mg total) by mouth daily. 90 capsule 3   rosuvastatin (CRESTOR) 10 MG tablet TAKE 1 TABLET BY MOUTH DAILY 90 tablet 1   tirzepatide (MOUNJARO) 12.5 MG/0.5ML Pen Inject 12.5 mg into the skin once a week. 2 mL 11   traMADol (ULTRAM-ER) 200 MG 24 hr tablet Take  200 mg by mouth daily.     No current facility-administered medications for this visit.   Known Allergies:  Allergies  Allergen Reactions   Bee Venom Anaphylaxis   Topiramate Other (See Comments)   Past Surgical History: Past Surgical History:  Procedure Laterality Date   BACK SURGERY     L5 to S1 discectomy   ECTOPIC PREGNANCY SURGERY     ENDOMETRIAL ABLATION     ESOPHAGOGASTRODUODENOSCOPY     in 20's had pepic ulcers    TUBAL LIGATION     TUBAL LIGATION     UPPER GASTROINTESTINAL ENDOSCOPY     Family History: Family History  Problem Relation Age of Onset   Asthma Mother    Allergic rhinitis Mother    Depression Mother    Pancreatic cancer Mother    Hypothyroidism Mother    Varicose Veins Mother    Colon polyps Mother    High blood pressure Mother    Aneurysm Father    AAA (abdominal aortic aneurysm) Father    Sudden death Father    Anxiety disorder Father    Drug abuse Sister    Hyperlipidemia Brother    Alcohol abuse Maternal Uncle    Diabetes Maternal Grandmother    Esophageal cancer Neg Hx    Colon cancer Neg Hx    Rectal cancer Neg Hx    Stomach cancer Neg Hx    Social History: Kelsey Snow lives damage in the house.  Hardwood throughout.  Electric heating, central cooling.  1 dog and 1 cat with access to bedroom.  No roaches in the house and bed is 2 feet off the floor.  No dust mite precautions.  Not exposed to tobacco.  On permanent disability.  ROS:  All other systems negative except as noted per HPI.  Objective:  Blood pressure (!) 150/90, pulse 65, temperature 98.3 F (36.8 C), temperature source Temporal, resp. rate 18, height 5\' 4"  (1.626 m), weight 197 lb 11.2 oz (89.7 kg), last menstrual period 02/26/2014, SpO2 100%. Body mass index is 33.94 kg/m. Physical Exam:  General Appearance:  Alert, cooperative, no distress, appears stated age  Head:  Normocephalic, without obvious abnormality, atraumatic  Eyes:  Conjunctiva clear, EOM's intact  Ears EACs  normal bilaterally and normal TMs bilaterally, serous fluid on left  Nose: Nares normal,  edematous pale nasal mucosa with copious clear rhinorrhea, no visible anterior polyps, and septum midline  Throat: Lips, tongue normal; teeth and gums normal, + cobblestoning  Neck: Supple, symmetrical  Lungs:   clear to auscultation bilaterally, Respirations unlabored, no coughing  Heart:  regular rate and rhythm and no murmur, Appears well perfused  Extremities: No edema  Skin: Skin color, texture, turgor normal and no rashes or lesions on visualized portions of skin  Neurologic: No gross deficits   Diagnostics:  Skin Testing: Environmental allergy panel. Adequate positive and negative controls. Results discussed with patient/family.  Airborne Adult Perc - 12/10/23 0930     Time Antigen Placed 0930    Allergen Manufacturer Waynette Buttery  Location Back    Number of Test 55    Panel 1 Select    1. Control-Buffer 50% Glycerol Negative    2. Control-Histamine 4+    3. Bahia 2+    4. [place] 2+    5. Johnson Negative    6. Kentucky Blue Negative    7. Meadow Fescue Negative    8. Perennial Rye Negative    9. Timothy Negative    10. Ragweed Mix Negative    11. Cocklebur Negative    12. Plantain,  English Negative    13. Baccharis Negative    14. Dog Fennel Negative    15. Russian Thistle Negative    16. Lamb's Quarters Negative    17. Sheep Sorrell Negative    18. Rough Pigweed Negative    19. Marsh Elder, Rough Negative    20. Mugwort, Common Negative    21. Box, Elder Negative    22. Cedar, red Negative    23. Sweet Gum 2+    24. Pecan Pollen 3+    25. Pine Mix Negative    26. Walnut, Black Pollen Negative    27. Red Mulberry Negative    28. Ash Mix Negative    29. Birch Mix Negative    30. Beech American Negative    31. Cottonwood, [place] Negative    32. Hickory, White 3+    33. Maple Mix Negative    34. Oak, [place] Mix Negative    35. Sycamore Eastern Negative    36.  Alternaria Alternata Negative    37. Cladosporium Herbarum Negative    38. Aspergillus Mix Negative    39. Penicillium Mix Negative    40. Bipolaris Sorokiniana (Helminthosporium) Negative    41. Drechslera Spicifera (Curvularia) Negative    42. Mucor Plumbeus Negative    43. Fusarium Moniliforme Negative    44. Aureobasidium Pullulans (pullulara) Negative    45. Rhizopus Oryzae Negative    46. Botrytis Cinera Negative    47. Epicoccum Nigrum Negative    48. Phoma Betae Negative    49. Dust Mite Mix Negative    50. Cat Hair 10,000 BAU/ml Negative    51.  Dog Epithelia Negative    52. Mixed Feathers Negative    53. Horse Epithelia Negative    54. Cockroach, German 3+    55. Tobacco Leaf Negative             Intradermal - 12/10/23 1010     Time Antigen Placed 1010    Allergen Manufacturer Waynette Buttery    Location Arm    Number of Test 12    Intradermal Select    Control Negative    Johnson Negative    7 Grass 2+    Ragweed Mix 2+    Weed Mix 2+    Mold 1 Negative    Mold 2 Negative    Mold 3 Negative    Mold 4 Negative    Mite Mix 3+    Cat Negative    Dog Negative             Allergy testing results were read and interpreted by myself, documented by clinical staff.    Labs:  Lab Orders  No laboratory test(s) ordered today     Assessment and Plan  Assessment and Plan    Chronic Rhinitis Seasonal and Perennial Allergic: - allergy testing today: Grass, weed, tree, dust mite, cockroach  - Prevention:  - allergen avoidance when possible Start allergy  injections.  Via Lamar Benes Had a detailed discussion with patient/family that clinical history is suggestive of allergic rhinitis, and may benefit from allergy immunotherapy (AIT). Discussed in detail regarding the dosing, schedule, side effects (mild to moderate local allergic reaction and rarely systemic allergic reactions including anaphylaxis/death), alternatives and benefits (significant improvement in nasal  symptoms, seasonal flares of asthma) of immunotherapy with the patient. There is significant time commitment involved with allergy shots, which includes weekly immunotherapy injections for first 9-12 months and then biweekly to monthly injections for 3-5 years. Clinical response is often delayed and patient may not see an improvement for 6-12 months. Consent was signed. I have prescribed epinephrine injectable and demonstrated proper use. For mild symptoms you can take over the counter antihistamines such as Benadryl and monitor symptoms closely. If symptoms worsen or if you have severe symptoms including breathing issues, throat closure, significant swelling, whole body hives, severe diarrhea and vomiting, lightheadedness then inject epinephrine and seek immediate medical care afterwards. Action plan given.   - Symptom control: - Continue Nasal Steroid Spray: Best results if used daily. - Options include Flonase (fluticasone), Nasocort (triamcinolone), Nasonex (mometasome) 1- 2 sprays in each nostril daily.  - All can be purchased over-the-counter if not covered by insurance. - Start Astelin (azelastine) 1-2 sprays in each nostril twice a day as needed for nasal congestion/itchy nose - Start Atrovent (Ipratropium Bromide) 1-2 sprays in each nostril up to 3 times a day as needed for runny nose/post nasal drip/drainage.   - Use less frequently if airway gets too dry. - Continue Antihistamine: daily or daily as needed.   -Options include Zyrtec (Cetirizine) 10mg , Claritin (Loratadine) 10mg , Allegra (Fexofenadine) 180mg , or Xyzal (Levocetirinze) 5mg  - Can be purchased over-the-counter if not covered by insurance. -Limit antihistamines with decongestants in them as this can contribute to high blood pressure  Allergic Conjunctivitis:  - Start Allergy Eye drops-Cromolyn-1 drop each eye up to 4 times daily as needed and Rewetting Drops such as Systane,TheraTears, etc  -Avoid eye drops that say red eye  relief as they may contain medications that dry out your eyes.  Gastroesophageal Reflux Disease Controlled with omeprazole. -Continue omeprazole as prescribed.  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 Carries EpiPen as a precaution. -Continue carrying EpiPen. -No need for repeat allergy shots at this time.  Follow up: In [facility] office for [place] immunotherapy    This note in its entirety was forwarded to the Provider who requested this consultation.  Other:  none   Thank you for your kind referral. I appreciate the opportunity to take part in Niesha's care. Please do not hesitate to contact me with questions.  Sincerely,  Thank you so much for letting me partake in your care today.  Don't hesitate to reach out if you have any additional concerns!  Ferol Luz, MD  [facility] [place], [place]

## 2023-12-13 ENCOUNTER — Ambulatory Visit (INDEPENDENT_AMBULATORY_CARE_PROVIDER_SITE_OTHER): Payer: Medicare HMO | Admitting: Bariatrics

## 2023-12-13 ENCOUNTER — Encounter (INDEPENDENT_AMBULATORY_CARE_PROVIDER_SITE_OTHER): Payer: Self-pay | Admitting: Sports Medicine

## 2023-12-13 ENCOUNTER — Encounter: Payer: Self-pay | Admitting: Bariatrics

## 2023-12-13 VITALS — BP 129/82 | HR 62 | Temp 98.2°F | Ht 64.0 in | Wt 195.0 lb

## 2023-12-13 DIAGNOSIS — Z6833 Body mass index (BMI) 33.0-33.9, adult: Secondary | ICD-10-CM | POA: Diagnosis not present

## 2023-12-13 DIAGNOSIS — E6609 Other obesity due to excess calories: Secondary | ICD-10-CM

## 2023-12-13 DIAGNOSIS — E119 Type 2 diabetes mellitus without complications: Secondary | ICD-10-CM | POA: Diagnosis not present

## 2023-12-13 DIAGNOSIS — Z7985 Long-term (current) use of injectable non-insulin antidiabetic drugs: Secondary | ICD-10-CM | POA: Diagnosis not present

## 2023-12-13 DIAGNOSIS — E669 Obesity, unspecified: Secondary | ICD-10-CM | POA: Diagnosis not present

## 2023-12-13 MED ORDER — MOUNJARO 15 MG/0.5ML ~~LOC~~ SOAJ: 15.0000 mg | SUBCUTANEOUS | 11 refills | Status: DC

## 2023-12-13 NOTE — Progress Notes (Signed)
 Aeroallergen Immunotherapy  Ordering Provider: Dr. Ferol Luz  Patient Details Name: Kelsey Snow MRN: [id number] Date of Birth: 1965-12-02  Order 1 of 1  Vial Label: [id number]  0.3 ml (Volume)  BAU Concentration -- 7 Grass Mix* 100,000 ([address] [place], [place], [place], [place] Rye, RedTop, Sweet Vernal, Timothy) 0.2 ml (Volume)  1:20 Concentration -- Bahia 0.3 ml (Volume)  BAU Concentration -- [place] 10,000 0.3 ml (Volume)  1:20 Concentration -- Ragweed Mix 0.5 ml (Volume)  1:20 Concentration -- Weed Mix* 0.5 ml (Volume)  1:20 Concentration -- Eastern 10 Tree Mix (also Sweet Gum) 0.2 ml (Volume)  1:10 Concentration -- Pecan Pollen 0.3 ml (Volume)  1:20 Concentration -- Cockroach, German 0.5 ml (Volume)   AU Concentration -- Mite Mix (DF 5,000 & DP 5,000)   3.1  ml Extract Subtotal 1.9  ml Diluent 5.0  ml Maintenance Total  Schedule:  RUSH Silver Vial (1:1,000,000): RUSH Blue Vial (1:100,000): RUSH Yellow Vial (1:10,000): RUSH Green Vial (1:1,000): Schedule B (6 doses) Red Vial (1:100): Schedule A (11 doses)  Special Instructions: none

## 2023-12-13 NOTE — Telephone Encounter (Signed)

## 2023-12-13 NOTE — Progress Notes (Signed)
NOT MADE UNTIL APPT IS SCHED. 

## 2023-12-13 NOTE — Progress Notes (Signed)
 WEIGHT SUMMARY AND BIOMETRICS  Weight Lost Since Last Visit: 0lb  Weight Gained Since Last Visit: 1lb   Vitals Temp: 98.2 F (36.8 C) BP: 129/82 Pulse Rate: 62 SpO2: 99 %   Anthropometric Measurements Height: 5\' 4"  (1.626 m) Weight: 195 lb (88.5 kg) BMI (Calculated): 33.46 Weight at Last Visit: 194lb Weight Lost Since Last Visit: 0lb Weight Gained Since Last Visit: 1lb Starting Weight: 260lb Total Weight Loss (lbs): 65 lb (29.5 kg)   Body Composition  Body Fat %: 46 % Fat Mass (lbs): 90 lbs Muscle Mass (lbs): 100.2 lbs Total Body Water (lbs): 75.2 lbs Visceral Fat Rating : 12   Other Clinical Data Fasting: No Labs: No Today's Visit #: 17 Starting Date: 09/16/22    OBESITY Kelsey Snow is here to discuss her progress with her obesity treatment plan along with follow-up of her obesity related diagnoses.    Nutrition Plan: the Category 3 plan - 75% adherence.  Current exercise: walking and weightlifting  Interim History:  She is up 1 lb since her last visit. She has been mourning the loss of her cat and has been doing more snacking.  Eating all of the food on the plan., Protein intake is less than prescribed., Is skipping meals, and Water intake is adequate.   Pharmacotherapy: Kelsey Snow is on Mounjaro 12.5 mg SQ weekly Adverse side effects: None Hunger is moderately controlled.  Cravings are moderately controlled.  Assessment/Plan:   Type II Diabetes HgbA1c is at goal. Last A1c was 5.6 CBGs: Not checking      Episodes of hypoglycemia: no Medication(s): Mounjaro 12.5 mg SQ weekly  Lab Results  Component Value Date   HGBA1C 5.6 10/15/2023   HGBA1C 6.2 (H) 02/03/2023   HGBA1C 6.7 (A) 11/16/2022   Lab Results  Component Value Date   MICROALBUR 30 10/15/2023   LDLCALC 94 02/03/2023   CREATININE 0.73 05/19/2023   No results found for:  "GFR"  Plan: Continue Mounjaro 12.5 mg SQ weekly Continue all other medications.  Will keep all carbohydrates low both sweets and starches.  Will continue exercise regimen to 30 to 60 minutes on most days of the week.  Aim for 7 to 9 hours of sleep nightly.  Eat more low glycemic index foods.  She will get back to her healthy snacks.  She will decrease her snacking at night She will increase her protein to help with her satiety.   Generalized Obesity: Current BMI BMI (Calculated): 33.46   Pharmacotherapy Plan Continue  Mounjaro 12.5 mg SQ weekly, if appetite/cravings persist will consider raising her Mounjaro back to 15 mg weekly.   Kelsey Snow is currently in the action stage of change. As such, her goal is to continue with weight loss efforts.  She has agreed to the Category 3 plan.  Exercise goals: For substantial health benefits, adults should do at least 150 minutes (2  hours and 30 minutes) a week of moderate-intensity, or 75 minutes (1 hour and 15 minutes) a week of vigorous-intensity aerobic physical activity, or an equivalent combination of moderate- and vigorous-intensity aerobic activity. Aerobic activity should be performed in episodes of at least 10 minutes, and preferably, it should be spread throughout the week.  Behavioral modification strategies: increasing lean protein intake, no meal skipping, decrease eating out, meal planning , increase water intake, better snacking choices, planning for success, decrease junk food, get rid of junk food in the home, decrease snacking , and avoiding temptations.  Kelsey Snow has agreed to follow-up with our clinic in 4 weeks.       Objective:   VITALS: Per patient if applicable, see vitals. GENERAL: Alert and in no acute distress. CARDIOPULMONARY: No increased WOB. Speaking in clear sentences.  PSYCH: Pleasant and cooperative. Speech normal rate and rhythm. Affect is appropriate. Insight and judgement are appropriate. Attention is focused,  linear, and appropriate.  NEURO: Oriented as arrived to appointment on time with no prompting.   Attestation Statements:   This was prepared with the assistance of Engineer, civil (consulting).  Occasional wrong-word or sound-a-like substitutions may have occurred due to the inherent limitations of voice recognition    Kelsey Capra, DO

## 2023-12-14 DIAGNOSIS — J3089 Other allergic rhinitis: Secondary | ICD-10-CM | POA: Diagnosis not present

## 2023-12-14 NOTE — Progress Notes (Signed)
 VIAL SET MADE 12-14-23. EXP 12-13-24

## 2023-12-29 DIAGNOSIS — M5451 Vertebrogenic low back pain: Secondary | ICD-10-CM | POA: Diagnosis not present

## 2023-12-29 DIAGNOSIS — M47816 Spondylosis without myelopathy or radiculopathy, lumbar region: Secondary | ICD-10-CM | POA: Diagnosis not present

## 2023-12-29 DIAGNOSIS — M7061 Trochanteric bursitis, right hip: Secondary | ICD-10-CM | POA: Diagnosis not present

## 2023-12-29 DIAGNOSIS — M961 Postlaminectomy syndrome, not elsewhere classified: Secondary | ICD-10-CM | POA: Diagnosis not present

## 2023-12-29 DIAGNOSIS — M7918 Myalgia, other site: Secondary | ICD-10-CM | POA: Diagnosis not present

## 2023-12-29 DIAGNOSIS — M5416 Radiculopathy, lumbar region: Secondary | ICD-10-CM | POA: Diagnosis not present

## 2023-12-29 DIAGNOSIS — Z79899 Other long term (current) drug therapy: Secondary | ICD-10-CM | POA: Diagnosis not present

## 2023-12-30 ENCOUNTER — Encounter: Payer: Self-pay | Admitting: Internal Medicine

## 2023-12-30 ENCOUNTER — Encounter: Payer: Medicare HMO | Admitting: Sports Medicine

## 2023-12-30 MED ORDER — FAMOTIDINE 20 MG PO TABS: ORAL_TABLET | ORAL | 0 refills | Status: DC

## 2023-12-30 MED ORDER — PREDNISONE 20 MG PO TABS: ORAL_TABLET | ORAL | 0 refills | Status: DC

## 2023-12-30 MED ORDER — MONTELUKAST SODIUM 10 MG PO TABS: ORAL_TABLET | ORAL | 0 refills | Status: DC

## 2023-12-30 MED ORDER — LEVOCETIRIZINE DIHYDROCHLORIDE 5 MG PO TABS: ORAL_TABLET | ORAL | 0 refills | Status: DC

## 2024-01-04 ENCOUNTER — Ambulatory Visit (INDEPENDENT_AMBULATORY_CARE_PROVIDER_SITE_OTHER): Payer: Medicare HMO | Admitting: Internal Medicine

## 2024-01-04 VITALS — BP 130/82 | HR 78 | Temp 98.2°F | Resp 16

## 2024-01-04 DIAGNOSIS — J302 Other seasonal allergic rhinitis: Secondary | ICD-10-CM

## 2024-01-04 DIAGNOSIS — J3089 Other allergic rhinitis: Secondary | ICD-10-CM

## 2024-01-04 NOTE — Progress Notes (Unsigned)
  Date of Service/Encounter:  01/04/24  Allergy testing appointment   Initial visit on 12/10/23, seen for rhinitis, bee allergy, GERD, allergic rhinoconjunctivits .  Please see that note for additional details.  Today reports for allergy diagnostic testing:    DIAGNOSTICS:  Skin Testing: {Blank single:19197::"Select foods","Environmental allergy panel","Environmental allergy panel and select foods","Food allergy panel","None","Deferred due to recent antihistamines use","deferred due to recent reaction"}. ***Adequate positive and negative controls Results discussed with patient/family.   Allergy testing results were read and interpreted by myself, documented by clinical staff.  Patient provided with copy of allergy testing along with avoidance measures when indicated. ***  Ferol Luz, MD  [facility]

## 2024-01-07 DIAGNOSIS — M4316 Spondylolisthesis, lumbar region: Secondary | ICD-10-CM | POA: Diagnosis not present

## 2024-01-07 DIAGNOSIS — M48061 Spinal stenosis, lumbar region without neurogenic claudication: Secondary | ICD-10-CM | POA: Diagnosis not present

## 2024-01-07 DIAGNOSIS — M51379 Other intervertebral disc degeneration, lumbosacral region without mention of lumbar back pain or lower extremity pain: Secondary | ICD-10-CM | POA: Diagnosis not present

## 2024-01-07 DIAGNOSIS — M4857XA Collapsed vertebra, not elsewhere classified, lumbosacral region, initial encounter for fracture: Secondary | ICD-10-CM | POA: Diagnosis not present

## 2024-01-07 DIAGNOSIS — M4726 Other spondylosis with radiculopathy, lumbar region: Secondary | ICD-10-CM | POA: Diagnosis not present

## 2024-01-07 DIAGNOSIS — M47816 Spondylosis without myelopathy or radiculopathy, lumbar region: Secondary | ICD-10-CM | POA: Diagnosis not present

## 2024-01-07 DIAGNOSIS — M961 Postlaminectomy syndrome, not elsewhere classified: Secondary | ICD-10-CM | POA: Diagnosis not present

## 2024-01-07 DIAGNOSIS — M5117 Intervertebral disc disorders with radiculopathy, lumbosacral region: Secondary | ICD-10-CM | POA: Diagnosis not present

## 2024-01-10 ENCOUNTER — Ambulatory Visit: Payer: Medicare HMO | Admitting: Bariatrics

## 2024-01-11 ENCOUNTER — Ambulatory Visit (INDEPENDENT_AMBULATORY_CARE_PROVIDER_SITE_OTHER): Payer: Self-pay

## 2024-01-11 DIAGNOSIS — J309 Allergic rhinitis, unspecified: Secondary | ICD-10-CM

## 2024-01-17 ENCOUNTER — Encounter (INDEPENDENT_AMBULATORY_CARE_PROVIDER_SITE_OTHER): Payer: Self-pay | Admitting: Sports Medicine

## 2024-01-17 ENCOUNTER — Encounter: Payer: Self-pay | Admitting: Bariatrics

## 2024-01-17 DIAGNOSIS — E119 Type 2 diabetes mellitus without complications: Secondary | ICD-10-CM

## 2024-01-18 ENCOUNTER — Encounter: Payer: Self-pay | Admitting: Sports Medicine

## 2024-01-18 ENCOUNTER — Encounter: Payer: Self-pay | Admitting: Bariatrics

## 2024-01-18 NOTE — Telephone Encounter (Signed)
 See other MyChart message

## 2024-01-18 NOTE — Telephone Encounter (Addendum)
Please see the MyChart message reply(ies) for my assessment and plan.    This patient gave consent for this Medical Advice Message and is aware that it may result in a bill to [employer], as well as the possibility of receiving a bill for a co-payment or deductible. They are an established patient, but are not seeking medical advice exclusively about a problem treated during an in person or video visit in the last seven days. I did not recommend an in person or video visit within seven days of my reply.  I spent 11 total minutes of online digital evaluation and management services in this patient-initiated request for online care.

## 2024-01-18 NOTE — Addendum Note (Signed)
 Addended by: Chalmers Cater on: 01/18/2024 02:58 PM   Modules accepted: Orders

## 2024-01-18 NOTE — Addendum Note (Signed)
 Addended by: Monica Becton on: 01/18/2024 05:06 PM   Modules accepted: Orders

## 2024-01-19 ENCOUNTER — Ambulatory Visit (INDEPENDENT_AMBULATORY_CARE_PROVIDER_SITE_OTHER): Payer: Medicare HMO | Admitting: Bariatrics

## 2024-01-19 ENCOUNTER — Encounter: Payer: Self-pay | Admitting: Bariatrics

## 2024-01-19 VITALS — BP 121/79 | HR 61 | Temp 98.2°F | Ht 64.0 in | Wt 187.0 lb

## 2024-01-19 DIAGNOSIS — E119 Type 2 diabetes mellitus without complications: Secondary | ICD-10-CM

## 2024-01-19 DIAGNOSIS — Z6832 Body mass index (BMI) 32.0-32.9, adult: Secondary | ICD-10-CM

## 2024-01-19 DIAGNOSIS — Z7985 Long-term (current) use of injectable non-insulin antidiabetic drugs: Secondary | ICD-10-CM | POA: Diagnosis not present

## 2024-01-19 DIAGNOSIS — E669 Obesity, unspecified: Secondary | ICD-10-CM | POA: Diagnosis not present

## 2024-01-19 DIAGNOSIS — E6609 Other obesity due to excess calories: Secondary | ICD-10-CM

## 2024-01-19 DIAGNOSIS — E559 Vitamin D deficiency, unspecified: Secondary | ICD-10-CM | POA: Diagnosis not present

## 2024-01-19 NOTE — Progress Notes (Signed)
 WEIGHT SUMMARY AND BIOMETRICS  Weight Lost Since Last Visit: 8lb  Weight Gained Since Last Visit: 0   Vitals Temp: 98.2 F (36.8 C) BP: 121/79 Pulse Rate: 61 SpO2: 100 %   Anthropometric Measurements Height: 5\' 4"  (1.626 m) Weight: 187 lb (84.8 kg) BMI (Calculated): 32.08 Weight at Last Visit: 195lb Weight Lost Since Last Visit: 8lb Weight Gained Since Last Visit: 0 Starting Weight: 260lb Total Weight Loss (lbs): 73 lb (33.1 kg)   Body Composition  Body Fat %: 43 % Fat Mass (lbs): 80.4 lbs Muscle Mass (lbs): 101.2 lbs Total Body Water (lbs): 69.8 lbs Visceral Fat Rating : 11   Other Clinical Data Fasting: no Labs: no Today's Visit #: 18 Starting Date: 09/16/22    OBESITY Kelsey Snow is here to discuss her progress with her obesity treatment plan along with follow-up of her obesity related diagnoses.    Nutrition Plan: the Category 3 plan - 0% adherence.  Current exercise: none  Interim History:  She is down 8 lbs since her last visit. Her stress has been high.  Her husband just had orthopedic surgery and she has been doing a lot of the cumulative work at the house.  She states that when she gets stressed she usually does not eat and she has not been eating consistently.  Eating all of the food on the plan., Protein intake is as prescribed, Is not skipping meals, and Water intake is adequate.   Pharmacotherapy: Kaiya is on Mounjaro 15 mg SQ weekly Adverse side effects: None Hunger is moderately controlled.  Cravings are moderately controlled.  Her cravings had been poorly controlled over the previous 2 weeks but now have subsided. Assessment/Plan:    Type II Diabetes HgbA1c is at goal. Last A1c was 5.6 Episodes of hypoglycemia: no Medication(s): Mounjaro 15 mg SQ weekly  Lab Results  Component Value Date   HGBA1C 5.6 10/15/2023   HGBA1C 6.2  (H) 02/03/2023   HGBA1C 6.7 (A) 11/16/2022   Lab Results  Component Value Date   MICROALBUR 30 10/15/2023   LDLCALC 94 02/03/2023   CREATININE 0.73 05/19/2023   No results found for: "GFR"  Plan: Continue Mounjaro 15 mg SQ weekly Continue all other medications.  Will keep all carbohydrates low both sweets and starches.  Will continue exercise regimen to 30 to 60 minutes on most days of the week.  Aim for 7 to 9 hours of sleep nightly.  Eat more low glycemic index foods.  She will avoid sweets completely.  She will continue to journal She recently had lab work with Dr. Benjamin Stain and she will review it with him in the near future.    Generalized Obesity: Current BMI BMI (Calculated): 32.08   Pharmacotherapy Plan Continue and refill and Continue  Mounjaro 15 mg SQ weekly  Mellisa is currently in the action stage of change. As such, her  goal is to continue with weight loss efforts.  She has agreed to the Category 3 plan.  Exercise goals: All adults should avoid inactivity. Some physical activity is better than none, and adults who participate in any amount of physical activity gain some health benefits.  Behavioral modification strategies: increasing lean protein intake, no meal skipping, decrease eating out, meal planning , better snacking choices, and planning for success.  Rosiland has agreed to follow-up with our clinic in 4 weeks.   No orders of the defined types were placed in this encounter.   Medications Discontinued During This Encounter  Medication Reason   cromolyn (OPTICROM) 4 % ophthalmic solution Patient Preference   famotidine (PEPCID) 20 MG tablet Patient Preference   levocetirizine (XYZAL) 5 MG tablet Patient Preference   montelukast (SINGULAIR) 10 MG tablet Patient Preference   predniSONE (DELTASONE) 20 MG tablet Patient Preference        Objective:   VITALS: Per patient if applicable, see vitals. GENERAL: Alert and in no acute  distress. CARDIOPULMONARY: No increased WOB. Speaking in clear sentences.  PSYCH: Pleasant and cooperative. Speech normal rate and rhythm. Affect is appropriate. Insight and judgement are appropriate. Attention is focused, linear, and appropriate.  NEURO: Oriented as arrived to appointment on time with no prompting.   Attestation Statements:   This was prepared with the assistance of Engineer, civil (consulting).  Occasional wrong-word or sound-a-like substitutions may have occurred due to the inherent limitations of voice recognition   Corinna Capra, DO

## 2024-01-21 ENCOUNTER — Ambulatory Visit (INDEPENDENT_AMBULATORY_CARE_PROVIDER_SITE_OTHER)

## 2024-01-21 DIAGNOSIS — J309 Allergic rhinitis, unspecified: Secondary | ICD-10-CM | POA: Diagnosis not present

## 2024-01-24 ENCOUNTER — Encounter: Payer: Self-pay | Admitting: Sports Medicine

## 2024-01-24 ENCOUNTER — Ambulatory Visit (INDEPENDENT_AMBULATORY_CARE_PROVIDER_SITE_OTHER): Payer: Medicare HMO | Admitting: Sports Medicine

## 2024-01-24 ENCOUNTER — Other Ambulatory Visit: Payer: Self-pay

## 2024-01-24 VITALS — BP 109/71 | HR 75 | Resp 20 | Ht 64.0 in | Wt 190.5 lb

## 2024-01-24 DIAGNOSIS — Z0000 Encounter for general adult medical examination without abnormal findings: Secondary | ICD-10-CM | POA: Diagnosis not present

## 2024-01-24 DIAGNOSIS — Z7985 Long-term (current) use of injectable non-insulin antidiabetic drugs: Secondary | ICD-10-CM | POA: Diagnosis not present

## 2024-01-24 DIAGNOSIS — Z13820 Encounter for screening for osteoporosis: Secondary | ICD-10-CM | POA: Diagnosis not present

## 2024-01-24 DIAGNOSIS — E1129 Type 2 diabetes mellitus with other diabetic kidney complication: Secondary | ICD-10-CM | POA: Diagnosis not present

## 2024-01-24 DIAGNOSIS — R809 Proteinuria, unspecified: Secondary | ICD-10-CM

## 2024-01-24 DIAGNOSIS — E119 Type 2 diabetes mellitus without complications: Secondary | ICD-10-CM

## 2024-01-24 DIAGNOSIS — I6523 Occlusion and stenosis of bilateral carotid arteries: Secondary | ICD-10-CM

## 2024-01-24 LAB — POCT UA - MICROALBUMIN
Creatinine, POC: 300 mg/dL
Microalbumin Ur, POC: 80 mg/L

## 2024-01-24 LAB — PROTEIN / CREATININE RATIO, URINE
Albumin, U: 0
Creatinine, Urine: 300

## 2024-01-24 LAB — HM AWV

## 2024-01-24 NOTE — Assessment & Plan Note (Signed)
 To nephrology, consider SGLT2.

## 2024-01-24 NOTE — Assessment & Plan Note (Addendum)
 Medicare wellness exam. Blood work previously done and normal. Due for bone density testing.

## 2024-01-24 NOTE — Progress Notes (Signed)
 Subjective:   Kelsey Snow is a 58 y.o. female who presents for Medicare Annual (Subsequent) preventive examination.  Visit Complete: In person  Patient Medicare AWV questionnaire was completed by the patient on 01/24/24 ; I have confirmed that all information answered by patient is correct and no changes since this date.    Objective:    Today's Vitals   01/24/24 1526  BP: 109/71  Pulse: 75  Resp: 20  SpO2: 98%  Weight: 190 lb 8 oz (86.4 kg)  Height: 5\' 4"  (1.626 m)   Body mass index is 32.7 kg/m.     06/01/2017   11:15 AM 11/26/2016    3:38 PM 11/29/2015    9:30 AM 11/13/2015    2:05 PM 03/01/2014    9:04 AM  Advanced Directives  Does Patient Have a Medical Advance Directive? Yes Yes Yes Yes Patient has advance directive, copy not in chart;Patient would not like information  Type of Advance Directive Living will Healthcare Power of [place];Living will Living will;Healthcare Power of Attorney    Does patient want to make changes to medical advance directive?   No - Patient declined    Copy of Healthcare Power of Attorney in Chart?  No - copy requested No - copy requested No - copy requested   Would patient like information on creating a medical advance directive?  No - Patient declined       Current Medications (verified) Outpatient Encounter Medications as of 01/24/2024  Medication Sig   aspirin 81 MG EC tablet TAKE 1 TABLET (81 MG TOTAL) BY MOUTH DAILY.   azelastine (ASTELIN) 0.1 % nasal spray Place 2 sprays into both nostrils 2 (two) times daily. Use in each nostril as directed   EPINEPHrine 0.3 mg/0.3 mL IJ SOAJ injection Inject 0.3 mg into the muscle as needed for anaphylaxis.   fluticasone (FLONASE) 50 MCG/ACT nasal spray SPRAY 2 SPRAYS IN EACH NOSTRIL DAILY   ipratropium (ATROVENT) 0.06 % nasal spray Place 2 sprays into both nostrils 4 (four) times daily.   levothyroxine (SYNTHROID) 112 MCG tablet TAKE 1 TABLET BY MOUTH DAILY BEFORE BREAKFAST   omeprazole  (PRILOSEC) 40 MG capsule Take 1 capsule (40 mg total) by mouth daily.   rosuvastatin (CRESTOR) 10 MG tablet TAKE 1 TABLET BY MOUTH DAILY   tirzepatide (MOUNJARO) 15 MG/0.5ML Pen Inject 15 mg into the skin once a week.   traMADol (ULTRAM-ER) 200 MG 24 hr tablet Take 200 mg by mouth daily.   No facility-administered encounter medications on file as of 01/24/2024.    Allergies (verified) Bee venom and Topiramate   History: Past Medical History:  Diagnosis Date   Anemia    Anxiety    Arthritis    Back pain    Carpal tunnel syndrome    Depression    Diabetes (HCC)    Essential hypertension, benign 03/07/2015   Fibromuscular dysplasia of carotid artery (HCC)    GERD (gastroesophageal reflux disease)    History of degenerative disc disease    Hx of ectopic pregnancy    Hyperlipidemia    Hypothyroidism    Joint pain    Lumbosacral spondylolysis    Obese    OCD (obsessive compulsive disorder)    Thyroid disease    Past Surgical History:  Procedure Laterality Date   BACK SURGERY     L5 to S1 discectomy   ECTOPIC PREGNANCY SURGERY     ENDOMETRIAL ABLATION     ESOPHAGOGASTRODUODENOSCOPY     in  20's had pepic ulcers    TUBAL LIGATION     TUBAL LIGATION     UPPER GASTROINTESTINAL ENDOSCOPY     Family History  Problem Relation Age of Onset   Asthma Mother    Allergic rhinitis Mother    Depression Mother    Pancreatic cancer Mother    Hypothyroidism Mother    Varicose Veins Mother    Colon polyps Mother    High blood pressure Mother    Aneurysm Father    AAA (abdominal aortic aneurysm) Father    Sudden death Father    Anxiety disorder Father    Drug abuse Sister    Hyperlipidemia Brother    Alcohol abuse Maternal Uncle    Diabetes Maternal Grandmother    Esophageal cancer Neg Hx    Colon cancer Neg Hx    Rectal cancer Neg Hx    Stomach cancer Neg Hx    Social History   Socioeconomic History   Marital status: Married    Spouse name: Not on file   Number of  children: 1   Years of education: 12   Highest education level: Associate degree: occupational, Scientist, product/process development, or vocational program  Occupational History    Employer: [employer]  Tobacco Use   Smoking status: Never    Passive exposure: Never   Smokeless tobacco: Never  Vaping Use   Vaping status: Never Used  Substance and Sexual Activity   Alcohol use: Yes    Alcohol/week: 6.0 standard drinks of alcohol    Types: 6 Standard drinks or equivalent per week    Comment: weekly   Drug use: No   Sexual activity: Not on file  Other Topics Concern   Not on file  Social History Narrative   Not on file   Social Drivers of Health   Financial Resource Strain: Low Risk  (10/13/2023)   Overall Financial Resource Strain (CARDIA)    Difficulty of Paying Living Expenses: Not very hard  Food Insecurity: No Food Insecurity (10/13/2023)   Hunger Vital Sign    Worried About Running Out of Food in the Last Year: Never true    Ran Out of Food in the Last Year: Never true  Transportation Needs: No Transportation Needs (10/13/2023)   PRAPARE - Administrator, Civil Service (Medical): No    Lack of Transportation (Non-Medical): No  Physical Activity: Insufficiently Active (10/13/2023)   Exercise Vital Sign    Days of Exercise per Week: 3 days    Minutes of Exercise per Session: 40 min  Stress: No Stress Concern Present (10/13/2023)   [employer] of Occupational Health - Occupational Stress Questionnaire    Feeling of Stress : Only a little  Social Connections: Moderately Isolated (10/13/2023)   Social Connection and Isolation Panel [NHANES]    Frequency of Communication with Friends and Family: Twice a week    Frequency of Social Gatherings with Friends and Family: Once a week    Attends Religious Services: Never    Database administrator or Organizations: No    Attends Engineer, structural: Not on file    Marital Status: Married    Tobacco  Counseling Counseling given: Not Answered   Clinical Intake:                        Activities of Daily Living     No data to display          Patient  Care Team: Monica Becton, MD as PCP - General (Family Medicine) Lorna Dibble, MD as Referring Physician (Neurosurgery) Dawayne Cirri, MD as Referring Physician (Neurology) Fransisco Hertz, MD as Consulting Physician (Vascular Surgery) Ardyth Gal, PA-C as Physician Assistant (Obstetrics and Gynecology)  Indicate any recent Medical Services you may have received from other than [facility] providers in the past year (date may be approximate).     Assessment:   This is a routine wellness examination for Kelsey Snow.  Hearing/Vision screen No results found.   Goals Addressed   None   Depression Screen    10/15/2023    9:54 AM 08/14/2022   10:42 AM 05/15/2022   10:02 AM 05/15/2022    9:58 AM 02/06/2021   10:33 AM 05/09/2020   10:57 AM 05/26/2019   11:32 AM  PHQ 2/9 Scores  PHQ - 2 Score 0 0 0 0 5 1 5   PHQ- 9 Score     18 8 20     Fall Risk     No data to display          MEDICARE RISK AT HOME:    TIMED UP AND GO:  Was the test performed?  No    Cognitive Function:   Normal  Immunizations Immunization History  Administered Date(s) Administered   Influenza Inj Mdck Quad With Preservative 07/27/2014, 07/01/2015, 06/18/2016, 06/15/2017, 07/01/2018, 07/24/2020, 07/07/2021   Influenza, Seasonal, Injecte, Preservative Fre 07/02/2023   Influenza,inj,Quad PF,6+ Mos 07/27/2014, 07/01/2015, 06/18/2016, 06/15/2017, 07/01/2018, 07/24/2020, 07/07/2021, 07/01/2022   Influenza-Unspecified 06/27/2019   Tdap 03/01/2014   Zoster Recombinant(Shingrix) 04/19/2019, 07/20/2019    TDAP status: Up to date  Flu Vaccine status: Up to date  Pneumococcal vaccine status: Due, Education has been provided regarding the importance of this vaccine. Advised may receive this vaccine at local pharmacy or Health  Dept. Aware to provide a copy of the vaccination record if obtained from local pharmacy or Health Dept. Verbalized acceptance and understanding.  Qualifies for Shingles Vaccine?  Completed Shingrix  Screening Tests Health Maintenance  Topic Date Due   COVID-19 Vaccine (1) Never done   Pneumococcal Vaccine 60-21 Years old (1 of 2 - PCV) Never done   FOOT EXAM  Never done   Hepatitis C Screening  Never done   DTaP/Tdap/Td (2 - Td or Tdap) 03/01/2024   OPHTHALMOLOGY EXAM  05/02/2024   INFLUENZA VACCINE  05/19/2024   HEMOGLOBIN A1C  07/20/2024   Cervical Cancer Screening (HPV/Pap Cotest)  08/29/2024   MAMMOGRAM  09/07/2024   Diabetic kidney evaluation - eGFR measurement  01/18/2025   Diabetic kidney evaluation - Urine ACR  01/23/2025   Medicare Annual Wellness (AWV)  01/23/2025   Colonoscopy  01/11/2030   HIV Screening  Completed   Zoster Vaccines- Shingrix  Completed   HPV VACCINES  Aged Out   Fecal DNA (Cologuard)  Discontinued    Health Maintenance  Health Maintenance Due  Topic Date Due   COVID-19 Vaccine (1) Never done   Pneumococcal Vaccine 35-57 Years old (1 of 2 - PCV) Never done   FOOT EXAM  Never done   Hepatitis C Screening  Never done   Community Resource Referral / Chronic Care Management: CRR required this visit?  No   CCM required this visit?  No     Plan:    Annual physical exam Medicare wellness exam. Blood work previously done and normal.  Controlled type 2 diabetes mellitus without complication, without long-term current use of insulin (HCC) Diabetes well-controlled, she  is down over 40 pounds on Mounjaro for seen, we did a urine microalbumin creatinine ratio today and it was elevated, as she does have microalbuminuria I would like her to touch base with nephrology, I do suspect an SGLT2 will be added.   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  I have personally reviewed and noted the following  in the patient's chart:   Medical and social history Use of alcohol, tobacco or illicit drugs  Current medications and supplements including opioid prescriptions. Patient is not currently taking opioid prescriptions. Functional ability and status Nutritional status Physical activity Advanced directives List of other physicians Hospitalizations, surgeries, and ER visits in previous 12 months Vitals Screenings to include cognitive, depression, and falls Referrals and appointments  In addition, I have reviewed and discussed with patient certain preventive protocols, quality metrics, and best practice recommendations. A written personalized care plan for preventive services as well as general preventive health recommendations were provided to patient.     Rodney Langton, MD   01/24/2024   After Visit Summary: (In Person-Printed) AVS printed and given to the patient  ___________________________________________ Ihor Austin. Benjamin Stain, M.D., ABFM., CAQSM., AME. Primary Care and Sports Medicine [facility]  Adjunct Instructor of Family Medicine  [employer]  Restaurant manager, fast food

## 2024-01-24 NOTE — Assessment & Plan Note (Signed)
 Diabetes well-controlled, she is down over 40 pounds on Mounjaro for seen, we did a urine microalbumin creatinine ratio today and it was elevated, as she does have microalbuminuria I would like her to touch base with nephrology, I do suspect an SGLT2 will be added.

## 2024-01-25 ENCOUNTER — Ambulatory Visit (INDEPENDENT_AMBULATORY_CARE_PROVIDER_SITE_OTHER): Payer: Self-pay

## 2024-01-25 DIAGNOSIS — J309 Allergic rhinitis, unspecified: Secondary | ICD-10-CM | POA: Diagnosis not present

## 2024-01-28 ENCOUNTER — Encounter: Payer: Self-pay | Admitting: Sports Medicine

## 2024-01-29 LAB — CMP14+EGFR
ALT: 16 IU/L (ref 0–32)
AST: 17 IU/L (ref 0–40)
Albumin: 4.6 g/dL (ref 3.8–4.9)
Alkaline Phosphatase: 55 IU/L (ref 44–121)
BUN/Creatinine Ratio: 24 — ABNORMAL HIGH (ref 9–23)
BUN: 17 mg/dL (ref 6–24)
Bilirubin Total: 0.3 mg/dL (ref 0.0–1.2)
CO2: 24 mmol/L (ref 20–29)
Calcium: 10 mg/dL (ref 8.7–10.2)
Chloride: 102 mmol/L (ref 96–106)
Creatinine, Ser: 0.7 mg/dL (ref 0.57–1.00)
Globulin, Total: 2.3 g/dL (ref 1.5–4.5)
Glucose: 78 mg/dL (ref 70–99)
Potassium: 4.5 mmol/L (ref 3.5–5.2)
Sodium: 139 mmol/L (ref 134–144)
Total Protein: 6.9 g/dL (ref 6.0–8.5)
eGFR: 101 mL/min/{1.73_m2} (ref >59)

## 2024-01-29 LAB — IRON,TIBC AND FERRITIN PANEL
Ferritin: 25 ng/mL (ref 15–150)
Iron Saturation: 20 % (ref 15–55)
Iron: 75 ug/dL (ref 27–159)
Total Iron Binding Capacity: 376 ug/dL (ref 250–450)
UIBC: 301 ug/dL (ref 131–425)

## 2024-01-29 LAB — CBC WITH DIFFERENTIAL/PLATELET
Basophils Absolute: 0.1 10*3/uL (ref 0.0–0.2)
Basos: 1 %
EOS (ABSOLUTE): 0.3 10*3/uL (ref 0.0–0.4)
Eos: 5 %
Hematocrit: 45.6 % (ref 34.0–46.6)
Hemoglobin: 14.7 g/dL (ref 11.1–15.9)
Immature Grans (Abs): 0 10*3/uL (ref 0.0–0.1)
Immature Granulocytes: 0 %
Lymphocytes Absolute: 1.9 10*3/uL (ref 0.7–3.1)
Lymphs: 32 %
MCH: 30.4 pg (ref 26.6–33.0)
MCHC: 32.2 g/dL (ref 31.5–35.7)
MCV: 94 fL (ref 79–97)
Monocytes Absolute: 0.5 10*3/uL (ref 0.1–0.9)
Monocytes: 8 %
Neutrophils Absolute: 3.1 10*3/uL (ref 1.4–7.0)
Neutrophils: 54 %
Platelets: 292 10*3/uL (ref 150–450)
RBC: 4.84 x10E6/uL (ref 3.77–5.28)
RDW: 13.3 % (ref 11.7–15.4)
WBC: 5.8 10*3/uL (ref 3.4–10.8)

## 2024-01-29 LAB — INSULIN, FREE AND TOTAL
Free Insulin: 10 uU/mL
Total Insulin: 10 uU/mL

## 2024-01-29 LAB — LIPID PANEL
Chol/HDL Ratio: 2.7 ratio (ref 0.0–4.4)
Cholesterol, Total: 164 mg/dL (ref 100–199)
HDL: 61 mg/dL (ref >39)
LDL Chol Calc (NIH): 89 mg/dL (ref 0–99)
Triglycerides: 76 mg/dL (ref 0–149)
VLDL Cholesterol Cal: 14 mg/dL (ref 5–40)

## 2024-01-29 LAB — HEMOGLOBIN A1C
Est. average glucose Bld gHb Est-mCnc: 114 mg/dL
Hgb A1c MFr Bld: 5.6 % (ref 4.8–5.6)

## 2024-01-29 LAB — TSH: TSH: 2.54 u[IU]/mL (ref 0.450–4.500)

## 2024-01-29 LAB — VITAMIN D 25 HYDROXY (VIT D DEFICIENCY, FRACTURES): Vit D, 25-Hydroxy: 56.2 ng/mL (ref 30.0–100.0)

## 2024-01-31 ENCOUNTER — Ambulatory Visit (INDEPENDENT_AMBULATORY_CARE_PROVIDER_SITE_OTHER): Payer: Self-pay

## 2024-01-31 DIAGNOSIS — J309 Allergic rhinitis, unspecified: Secondary | ICD-10-CM | POA: Diagnosis not present

## 2024-02-01 ENCOUNTER — Ambulatory Visit (HOSPITAL_COMMUNITY): Payer: Medicare HMO

## 2024-02-01 ENCOUNTER — Ambulatory Visit: Payer: Medicare HMO

## 2024-02-07 ENCOUNTER — Ambulatory Visit (INDEPENDENT_AMBULATORY_CARE_PROVIDER_SITE_OTHER): Payer: Self-pay

## 2024-02-07 DIAGNOSIS — Z1151 Encounter for screening for human papillomavirus (HPV): Secondary | ICD-10-CM | POA: Diagnosis not present

## 2024-02-07 DIAGNOSIS — J309 Allergic rhinitis, unspecified: Secondary | ICD-10-CM | POA: Diagnosis not present

## 2024-02-07 DIAGNOSIS — Z01419 Encounter for gynecological examination (general) (routine) without abnormal findings: Secondary | ICD-10-CM | POA: Diagnosis not present

## 2024-02-14 ENCOUNTER — Encounter: Payer: Self-pay | Admitting: Bariatrics

## 2024-02-15 ENCOUNTER — Ambulatory Visit (INDEPENDENT_AMBULATORY_CARE_PROVIDER_SITE_OTHER): Payer: Self-pay

## 2024-02-15 ENCOUNTER — Ambulatory Visit (INDEPENDENT_AMBULATORY_CARE_PROVIDER_SITE_OTHER): Admitting: Sports Medicine

## 2024-02-15 ENCOUNTER — Other Ambulatory Visit (INDEPENDENT_AMBULATORY_CARE_PROVIDER_SITE_OTHER)

## 2024-02-15 DIAGNOSIS — J309 Allergic rhinitis, unspecified: Secondary | ICD-10-CM | POA: Diagnosis not present

## 2024-02-15 DIAGNOSIS — M1812 Unilateral primary osteoarthritis of first carpometacarpal joint, left hand: Secondary | ICD-10-CM

## 2024-02-15 MED ORDER — TRIAMCINOLONE ACETONIDE 40 MG/ML IJ SUSP
20.0000 mg | Freq: Once | INTRAMUSCULAR | Status: AC
  Administered 2024-02-15: 20 mg via INTRAMUSCULAR

## 2024-02-15 NOTE — Progress Notes (Signed)
    Procedures performed today:    Procedure: Real-time Ultrasound Guided injection of the left first [facility] Device: Samsung HS60  Verbal informed consent obtained.  Time-out conducted.  Noted no overlying erythema, induration, or other signs of local infection.  Skin prepped in a sterile fashion.  Local anesthesia: Topical Ethyl chloride.  With sterile technique and under real time ultrasound guidance: Arthritic joint noted, 0.5 cc lidocaine, 0.5 cc kenalog  40 injected easily Completed without difficulty  Advised to call if fevers/chills, erythema, induration, drainage, or persistent bleeding.  Images permanently stored and available for review in PACS.  Impression: Technically successful ultrasound guided injection.  Independent interpretation of notes and tests performed by another provider:   None.  Brief History, Exam, Impression, and Recommendations:    Primary osteoarthritis of first carpometacarpal joint of left hand Known for [facility] arthritis, left-sided, last injection January 2025, repeat left for [facility] injection, return as needed.    ____________________________________________ Joselyn Nicely. Sandy Crumb, M.D., ABFM., CAQSM., AME. Primary Care and Sports Medicine [facility]  Adjunct Professor of [facility] [employer]  [employer]  Restaurant manager, fast food

## 2024-02-15 NOTE — Assessment & Plan Note (Signed)
 Known for [facility] arthritis, left-sided, last injection January 2025, repeat left for [facility] injection, return as needed.

## 2024-02-15 NOTE — Addendum Note (Signed)
 Addended by: Montgomery Apgar on: 02/15/2024 04:49 PM   Modules accepted: Orders

## 2024-02-16 NOTE — Telephone Encounter (Signed)
 Authorization starting on 10/20/2023 and ending on 10/18/2024.

## 2024-02-16 NOTE — Telephone Encounter (Signed)
 This request has been handled. No further action is required. Please review other telephone encounters for additional information.

## 2024-02-17 ENCOUNTER — Ambulatory Visit: Admitting: Bariatrics

## 2024-02-22 ENCOUNTER — Ambulatory Visit (INDEPENDENT_AMBULATORY_CARE_PROVIDER_SITE_OTHER): Payer: Self-pay

## 2024-02-22 DIAGNOSIS — J309 Allergic rhinitis, unspecified: Secondary | ICD-10-CM | POA: Diagnosis not present

## 2024-02-23 ENCOUNTER — Encounter: Payer: Self-pay | Admitting: Sports Medicine

## 2024-02-23 ENCOUNTER — Ambulatory Visit: Admitting: Bariatrics

## 2024-02-29 ENCOUNTER — Ambulatory Visit (INDEPENDENT_AMBULATORY_CARE_PROVIDER_SITE_OTHER): Payer: Self-pay

## 2024-02-29 DIAGNOSIS — J309 Allergic rhinitis, unspecified: Secondary | ICD-10-CM

## 2024-03-02 DIAGNOSIS — M5451 Vertebrogenic low back pain: Secondary | ICD-10-CM | POA: Diagnosis not present

## 2024-03-02 DIAGNOSIS — G894 Chronic pain syndrome: Secondary | ICD-10-CM | POA: Diagnosis not present

## 2024-03-02 DIAGNOSIS — Z79899 Other long term (current) drug therapy: Secondary | ICD-10-CM | POA: Diagnosis not present

## 2024-03-02 DIAGNOSIS — M5416 Radiculopathy, lumbar region: Secondary | ICD-10-CM | POA: Diagnosis not present

## 2024-03-02 DIAGNOSIS — M47816 Spondylosis without myelopathy or radiculopathy, lumbar region: Secondary | ICD-10-CM | POA: Diagnosis not present

## 2024-03-02 DIAGNOSIS — M7061 Trochanteric bursitis, right hip: Secondary | ICD-10-CM | POA: Diagnosis not present

## 2024-03-02 DIAGNOSIS — M7062 Trochanteric bursitis, left hip: Secondary | ICD-10-CM | POA: Diagnosis not present

## 2024-03-02 DIAGNOSIS — M7918 Myalgia, other site: Secondary | ICD-10-CM | POA: Diagnosis not present

## 2024-03-02 DIAGNOSIS — M961 Postlaminectomy syndrome, not elsewhere classified: Secondary | ICD-10-CM | POA: Diagnosis not present

## 2024-03-07 ENCOUNTER — Ambulatory Visit (INDEPENDENT_AMBULATORY_CARE_PROVIDER_SITE_OTHER)

## 2024-03-07 DIAGNOSIS — J309 Allergic rhinitis, unspecified: Secondary | ICD-10-CM

## 2024-03-08 ENCOUNTER — Ambulatory Visit (INDEPENDENT_AMBULATORY_CARE_PROVIDER_SITE_OTHER)

## 2024-03-08 ENCOUNTER — Ambulatory Visit: Payer: Self-pay | Admitting: Sports Medicine

## 2024-03-08 DIAGNOSIS — Z780 Asymptomatic menopausal state: Secondary | ICD-10-CM | POA: Diagnosis not present

## 2024-03-08 DIAGNOSIS — Z13820 Encounter for screening for osteoporosis: Secondary | ICD-10-CM | POA: Diagnosis not present

## 2024-03-14 ENCOUNTER — Encounter (HOSPITAL_COMMUNITY)

## 2024-03-14 ENCOUNTER — Ambulatory Visit

## 2024-03-15 ENCOUNTER — Ambulatory Visit (INDEPENDENT_AMBULATORY_CARE_PROVIDER_SITE_OTHER): Payer: Self-pay

## 2024-03-15 DIAGNOSIS — J309 Allergic rhinitis, unspecified: Secondary | ICD-10-CM | POA: Diagnosis not present

## 2024-03-19 ENCOUNTER — Encounter: Payer: Self-pay | Admitting: Sports Medicine

## 2024-03-21 ENCOUNTER — Encounter: Payer: Self-pay | Admitting: Sports Medicine

## 2024-03-23 ENCOUNTER — Ambulatory Visit (INDEPENDENT_AMBULATORY_CARE_PROVIDER_SITE_OTHER): Payer: Self-pay

## 2024-03-23 DIAGNOSIS — J309 Allergic rhinitis, unspecified: Secondary | ICD-10-CM

## 2024-03-27 ENCOUNTER — Ambulatory Visit (INDEPENDENT_AMBULATORY_CARE_PROVIDER_SITE_OTHER): Payer: Self-pay

## 2024-03-27 DIAGNOSIS — J309 Allergic rhinitis, unspecified: Secondary | ICD-10-CM

## 2024-03-31 ENCOUNTER — Encounter: Payer: Self-pay | Admitting: Bariatrics

## 2024-04-03 ENCOUNTER — Encounter (INDEPENDENT_AMBULATORY_CARE_PROVIDER_SITE_OTHER): Payer: Self-pay | Admitting: Sports Medicine

## 2024-04-03 DIAGNOSIS — K635 Polyp of colon: Secondary | ICD-10-CM | POA: Diagnosis not present

## 2024-04-03 DIAGNOSIS — Z6379 Other stressful life events affecting family and household: Secondary | ICD-10-CM | POA: Diagnosis not present

## 2024-04-04 DIAGNOSIS — K635 Polyp of colon: Secondary | ICD-10-CM | POA: Insufficient documentation

## 2024-04-04 NOTE — Assessment & Plan Note (Signed)
 Multiple polyps noted colonoscopy 2021, 3-year recall recommended, we will go ahead and get her back on schedule, referral placed back to Dr. Karene Oto.

## 2024-04-04 NOTE — Telephone Encounter (Signed)

## 2024-04-05 NOTE — Telephone Encounter (Addendum)

## 2024-04-07 ENCOUNTER — Ambulatory Visit (INDEPENDENT_AMBULATORY_CARE_PROVIDER_SITE_OTHER): Payer: Self-pay | Admitting: *Deleted

## 2024-04-07 DIAGNOSIS — J309 Allergic rhinitis, unspecified: Secondary | ICD-10-CM

## 2024-04-12 ENCOUNTER — Encounter: Payer: Self-pay | Admitting: Bariatrics

## 2024-04-12 ENCOUNTER — Ambulatory Visit (INDEPENDENT_AMBULATORY_CARE_PROVIDER_SITE_OTHER): Admitting: Bariatrics

## 2024-04-12 VITALS — BP 120/82 | HR 72 | Temp 97.7°F | Ht 64.0 in | Wt 170.0 lb

## 2024-04-12 DIAGNOSIS — Z7985 Long-term (current) use of injectable non-insulin antidiabetic drugs: Secondary | ICD-10-CM

## 2024-04-12 DIAGNOSIS — Z6829 Body mass index (BMI) 29.0-29.9, adult: Secondary | ICD-10-CM | POA: Diagnosis not present

## 2024-04-12 DIAGNOSIS — E669 Obesity, unspecified: Secondary | ICD-10-CM | POA: Diagnosis not present

## 2024-04-12 DIAGNOSIS — E119 Type 2 diabetes mellitus without complications: Secondary | ICD-10-CM

## 2024-04-12 DIAGNOSIS — E1169 Type 2 diabetes mellitus with other specified complication: Secondary | ICD-10-CM

## 2024-04-12 NOTE — Progress Notes (Signed)
 WEIGHT SUMMARY AND BIOMETRICS  Weight Lost Since Last Visit: 17lb  Weight Gained Since Last Visit: 0   Vitals Temp: 97.7 F (36.5 C) BP: 120/82 Pulse Rate: 72 SpO2: 100 %   Anthropometric Measurements Height: 5' 4 (1.626 m) Weight: 170 lb (77.1 kg) BMI (Calculated): 29.17 Weight at Last Visit: 187lb Weight Lost Since Last Visit: 17lb Weight Gained Since Last Visit: 0 Starting Weight: 260lb Total Weight Loss (lbs): 90 lb (40.8 kg)   Body Composition  Body Fat %: 40.8 % Fat Mass (lbs): 69.6 lbs Muscle Mass (lbs): 95.8 lbs Total Body Water (lbs): 66.8 lbs Visceral Fat Rating : 10   Other Clinical Data Fasting: no Labs: no Today's Visit #: 19 Starting Date: 09/16/22    OBESITY Kelsey Snow is here to discuss her progress with her obesity treatment plan along with follow-up of her obesity related diagnoses.    Nutrition Plan: the Category 3 plan - 0% adherence.  Current exercise: none  Interim History:  Her weight is down about 17 lbs.  She has not been eating on a regular basis and has not been getting her protein at regular intervals.  Husband has been very sick and he has had multiple hospitalizations which has been stressful for her and has interfered with her schedule.  Additionally she has not been resting well.  She has been skipping meals on a regular basis Eating all of the food on the plan., Protein intake is as prescribed, Is skipping meals, and Water intake is adequate. According to the bioimpedance scale, she has lost significant muscle with her weight loss this time.  She is typically able to keep her muscle very well even when she loses weight.  Pharmacotherapy: Kelsey Snow is on Mounjaro  15 mg SQ weekly Adverse side effects: None Hunger is moderately controlled.  Cravings are moderately controlled.  Assessment/Plan:   Type II Diabetes HgbA1c  is at goal. Last A1c was 5.6 CBGs: Not checking      Episodes of hypoglycemia: no Medication(s): Mounjaro  15 mg SQ weekly  Lab Results  Component Value Date   HGBA1C 5.6 01/19/2024   HGBA1C 5.6 10/15/2023   HGBA1C 6.2 (H) 02/03/2023   Lab Results  Component Value Date   MICROALBUR 80 01/24/2024   LDLCALC 89 01/19/2024   CREATININE 0.70 01/19/2024   No results found for: GFR  Plan: Continue Mounjaro  15 mg SQ weekly Continue all other medications.  Will keep all carbohydrates low both sweets and starches.  Will continue exercise regimen to 30 to 60 minutes on most days of the week.  Aim for 7 to 9 hours of sleep nightly.  Eat more low glycemic index foods.  She will try not to skip meals. We discussed protein options for each meal. She will have a protein shake if she does not eat.  She will start some mild weight training at  home..  She will slowly increase her resistance training to help her maintain or increase her muscle. She will walk when she can.    Generalized Obesity: Current BMI BMI (Calculated): 29.17   Pharmacotherapy Plan Continue  Mounjaro  15 mg SQ weekly  Kelsey Snow is currently in the action stage of change. As such, her goal is to continue with weight loss efforts.  She has agreed to the Category 3 plan.  Exercise goals: For substantial health benefits, adults should do at least 150 minutes (2 hours and 30 minutes) a week of moderate-intensity, or 75 minutes (1 hour and 15 minutes) a week of vigorous-intensity aerobic physical activity, or an equivalent combination of moderate- and vigorous-intensity aerobic activity. Aerobic activity should be performed in episodes of at least 10 minutes, and preferably, it should be spread throughout the week. She will get back to doing her weights at home and will hopefully be able to get back into the gym.   Behavioral modification strategies: increasing lean protein intake, decreasing simple carbohydrates , no meal  skipping, increase water intake, better snacking choices, planning for success, emotional eating strategies, keep healthy foods in the home, weigh protein portions, and mindful eating.  Kelsey Snow has agreed to follow-up with our clinic in 4 weeks.    Objective:   VITALS: Per patient if applicable, see vitals. GENERAL: Alert and in no acute distress. CARDIOPULMONARY: No increased WOB. Speaking in clear sentences.  PSYCH: Pleasant and cooperative. Speech normal rate and rhythm. Affect is appropriate. Insight and judgement are appropriate. Attention is focused, linear, and appropriate.  NEURO: Oriented as arrived to appointment on time with no prompting.   Attestation Statements:   This was prepared with the assistance of Engineer, civil (consulting).  Occasional wrong-word or sound-a-like substitutions may have occurred due to the inherent limitations of voice recognition   Clayborne Daring, DO

## 2024-04-14 ENCOUNTER — Ambulatory Visit (INDEPENDENT_AMBULATORY_CARE_PROVIDER_SITE_OTHER)

## 2024-04-14 ENCOUNTER — Ambulatory Visit (INDEPENDENT_AMBULATORY_CARE_PROVIDER_SITE_OTHER): Payer: Medicare HMO | Admitting: Sports Medicine

## 2024-04-14 VITALS — BP 128/80 | HR 58 | Wt 175.0 lb

## 2024-04-14 DIAGNOSIS — E1169 Type 2 diabetes mellitus with other specified complication: Secondary | ICD-10-CM | POA: Diagnosis not present

## 2024-04-14 DIAGNOSIS — E669 Obesity, unspecified: Secondary | ICD-10-CM | POA: Diagnosis not present

## 2024-04-14 DIAGNOSIS — R809 Proteinuria, unspecified: Secondary | ICD-10-CM | POA: Diagnosis not present

## 2024-04-14 DIAGNOSIS — E1129 Type 2 diabetes mellitus with other diabetic kidney complication: Secondary | ICD-10-CM

## 2024-04-14 DIAGNOSIS — Z7984 Long term (current) use of oral hypoglycemic drugs: Secondary | ICD-10-CM

## 2024-04-14 DIAGNOSIS — J309 Allergic rhinitis, unspecified: Secondary | ICD-10-CM | POA: Diagnosis not present

## 2024-04-14 LAB — POCT GLYCOSYLATED HEMOGLOBIN (HGB A1C): HbA1c, POC (controlled diabetic range): 5.4 % (ref 0.0–7.0)

## 2024-04-14 NOTE — Assessment & Plan Note (Signed)
 A1c down to 5.4, continuing to lose weight, she does have some loose skin as expected, I have advised aggressive strength training, cut out on the cardio.

## 2024-04-14 NOTE — Progress Notes (Signed)
    Procedures performed today:    None.  Independent interpretation of notes and tests performed by another provider:   None.  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  I spent 30 minutes of total time managing this patient today, this includes chart review, face to face, and non-face to face time.  We did have extensive discussion about her husband.  ____________________________________________ Debby PARAS. Curtis, M.D., ABFM., CAQSM., AME. Primary Care and Sports Medicine [facility]  Adjunct Professor of [facility] Medicine  [employer]  Restaurant manager, fast food

## 2024-04-19 ENCOUNTER — Ambulatory Visit (INDEPENDENT_AMBULATORY_CARE_PROVIDER_SITE_OTHER): Payer: Self-pay

## 2024-04-19 DIAGNOSIS — J309 Allergic rhinitis, unspecified: Secondary | ICD-10-CM | POA: Diagnosis not present

## 2024-04-27 DIAGNOSIS — R809 Proteinuria, unspecified: Secondary | ICD-10-CM | POA: Diagnosis not present

## 2024-04-27 NOTE — Progress Notes (Signed)
 VIAL TO BE MADE 05-01-24

## 2024-04-28 ENCOUNTER — Ambulatory Visit (INDEPENDENT_AMBULATORY_CARE_PROVIDER_SITE_OTHER)

## 2024-04-28 ENCOUNTER — Encounter: Payer: Self-pay | Admitting: Sports Medicine

## 2024-04-28 DIAGNOSIS — J309 Allergic rhinitis, unspecified: Secondary | ICD-10-CM

## 2024-05-01 DIAGNOSIS — J3089 Other allergic rhinitis: Secondary | ICD-10-CM | POA: Diagnosis not present

## 2024-05-02 ENCOUNTER — Other Ambulatory Visit: Payer: Self-pay | Admitting: Vascular Surgery

## 2024-05-02 DIAGNOSIS — I6523 Occlusion and stenosis of bilateral carotid arteries: Secondary | ICD-10-CM

## 2024-05-09 ENCOUNTER — Ambulatory Visit (INDEPENDENT_AMBULATORY_CARE_PROVIDER_SITE_OTHER): Admitting: Sports Medicine

## 2024-05-09 ENCOUNTER — Other Ambulatory Visit (INDEPENDENT_AMBULATORY_CARE_PROVIDER_SITE_OTHER)

## 2024-05-09 DIAGNOSIS — M1812 Unilateral primary osteoarthritis of first carpometacarpal joint, left hand: Secondary | ICD-10-CM

## 2024-05-09 MED ORDER — TRIAMCINOLONE ACETONIDE 40 MG/ML IJ SUSP
20.0000 mg | Freq: Once | INTRAMUSCULAR | Status: AC
  Administered 2024-05-09: 20 mg via INTRA_ARTICULAR

## 2024-05-09 NOTE — Progress Notes (Signed)
    Procedures performed today:    Procedure: Real-time Ultrasound Guided injection of the left first carpometacarpal joint Device: Samsung HS60  Verbal informed consent obtained.  Time-out conducted.  Noted no overlying erythema, induration, or other signs of local infection.  Skin prepped in a sterile fashion.  Local anesthesia: Topical Ethyl chloride.  With sterile technique and under real time ultrasound guidance: Arthritic joint noted, 0.5 cc lidocaine, 0.5 cc kenalog  40 injected easily. Completed without difficulty  Advised to call if fevers/chills, erythema, induration, drainage, or persistent bleeding.  Images permanently stored and available for review in PACS.  Impression: Technically successful ultrasound guided injection.  Independent interpretation of notes and tests performed by another provider:   None.  Brief History, Exam, Impression, and Recommendations:    Primary osteoarthritis of first carpometacarpal joint of left hand This exquisitely pleasant 58 year old female returns, she has known for [facility] osteoarthritis last injection was approximately 2 months and 3 weeks ago, she understands it is a bit early, we will do her injection today but no more for 4 months. Next soonest injection can be end of November.    ____________________________________________ Debby PARAS. Curtis, M.D., ABFM., CAQSM., AME. Primary Care and Sports Medicine  [facility]  Adjunct Professor of [facility] Medicine  [employer]  Restaurant manager, fast food

## 2024-05-09 NOTE — Assessment & Plan Note (Signed)
 This exquisitely pleasant 58 year old female returns, she has known for [facility] osteoarthritis last injection was approximately 2 months and 3 weeks ago, she understands it is a bit early, we will do her injection today but no more for 4 months. Next soonest injection can be end of November.

## 2024-05-09 NOTE — Addendum Note (Signed)
 Addended by: OLEY CHIQUITA CROME on: 05/09/2024 01:58 PM   Modules accepted: Orders

## 2024-05-10 ENCOUNTER — Ambulatory Visit: Admitting: Bariatrics

## 2024-05-10 NOTE — Progress Notes (Addendum)
 Office Note     CC:  follow up Requesting Provider:  Curtis Debby PARAS,*  HPI: Kelsey Snow is a 58 y.o. ([birth year]) female who presents for surveillance of carotid artery stenosis.  She originally presented with asymptomatic left ICA dissection which has since resolved.  She denies any neurological events since last office visit including slurring speech, changes in vision, or one-sided weakness.  She is on aspirin  and statin daily.  She follows regularly with her PCP for management of chronic medical conditions including hypertension, hyperlipidemia, and diabetes mellitus.  She denies tobacco use.     Past Medical History:  Diagnosis Date   Anemia    Anxiety    Arthritis    Back pain    Carpal tunnel syndrome    Depression    Diabetes (HCC)    Essential hypertension, benign 03/07/2015   Fibromuscular dysplasia of carotid artery (HCC)    GERD (gastroesophageal reflux disease)    History of degenerative disc disease    Hx of ectopic pregnancy    Hyperlipidemia    Hypothyroidism    Joint pain    Lumbosacral spondylolysis    Obese    OCD (obsessive compulsive disorder)    Thyroid  disease     Past Surgical History:  Procedure Laterality Date   BACK SURGERY     L5 to S1 discectomy   ECTOPIC PREGNANCY SURGERY     ENDOMETRIAL ABLATION     ESOPHAGOGASTRODUODENOSCOPY     in 20's had pepic ulcers    TUBAL LIGATION     TUBAL LIGATION     UPPER GASTROINTESTINAL ENDOSCOPY      Social History   Socioeconomic History   Marital status: Married    Spouse name: Not on file   Number of children: 1   Years of education: 12   Highest education level: Some college, no degree  Occupational History    Employer: [employer]  Tobacco Use   Smoking status: Never    Passive exposure: Never   Smokeless tobacco: Never  Vaping Use   Vaping status: Never Used  Substance and Sexual Activity   Alcohol use: Yes    Alcohol/week: 6.0 standard drinks of alcohol    Types:  6 Standard drinks or equivalent per week    Comment: weekly   Drug use: No   Sexual activity: Not on file  Other Topics Concern   Not on file  Social History Narrative   Not on file   Social Drivers of Health   Financial Resource Strain: Low Risk  (05/08/2024)   Overall Financial Resource Strain (CARDIA)    Difficulty of Paying Living Expenses: Not hard at all  Food Insecurity: No Food Insecurity (05/08/2024)   Hunger Vital Sign    Worried About Running Out of Food in the Last Year: Never true    Ran Out of Food in the Last Year: Never true  Transportation Needs: No Transportation Needs (05/08/2024)   PRAPARE - Administrator, Civil Service (Medical): No    Lack of Transportation (Non-Medical): No  Physical Activity: Sufficiently Active (05/08/2024)   Exercise Vital Sign    Days of Exercise per Week: 3 days    Minutes of Exercise per Session: 50 min  Stress: No Stress Concern Present (05/08/2024)   [employer] of Occupational Health - Occupational Stress Questionnaire    Feeling of Stress: Only a little  Social Connections: Moderately Isolated (05/08/2024)   Social Connection and Isolation Panel  Frequency of Communication with Friends and Family: Twice a week    Frequency of Social Gatherings with Friends and Family: Once a week    Attends Religious Services: Never    Database administrator or Organizations: No    Attends Engineer, structural: Not on file    Marital Status: Married  Catering manager Violence: Not At Risk (04/24/2024)   Received from [facility]   HITS    Over the last 12 months how often did your partner physically hurt you?: Never    Over the last 12 months how often did your partner insult you or talk down to you?: Never    Over the last 12 months how often did your partner threaten you with physical harm?: Never    Over the last 12 months how often did your partner scream or curse at you?: Never    Family History  Problem  Relation Age of Onset   Asthma Mother    Allergic rhinitis Mother    Depression Mother    Pancreatic cancer Mother    Hypothyroidism Mother    Varicose Veins Mother    Colon polyps Mother    High blood pressure Mother    Aneurysm Father    AAA (abdominal aortic aneurysm) Father    Sudden death Father    Anxiety disorder Father    Drug abuse Sister    Hyperlipidemia Brother    Alcohol abuse Maternal Uncle    Diabetes Maternal Grandmother    Esophageal cancer Neg Hx    Colon cancer Neg Hx    Rectal cancer Neg Hx    Stomach cancer Neg Hx     Current Outpatient Medications  Medication Sig Dispense Refill   aspirin  81 MG EC tablet TAKE 1 TABLET (81 MG TOTAL) BY MOUTH DAILY. 90 tablet 3   azelastine  (ASTELIN ) 0.1 % nasal spray Place 2 sprays into both nostrils 2 (two) times daily. Use in each nostril as directed 30 mL 12   EPINEPHrine  0.3 mg/0.3 mL IJ SOAJ injection Inject 0.3 mg into the muscle as needed for anaphylaxis. 1 each 11   fluticasone  (FLONASE ) 50 MCG/ACT nasal spray SPRAY 2 SPRAYS IN EACH NOSTRIL DAILY 16 mL 11   ipratropium (ATROVENT ) 0.06 % nasal spray Place 2 sprays into both nostrils 4 (four) times daily. 15 mL 12   levothyroxine  (SYNTHROID ) 112 MCG tablet TAKE 1 TABLET BY MOUTH DAILY BEFORE BREAKFAST 90 tablet 3   omeprazole  (PRILOSEC) 40 MG capsule Take 1 capsule (40 mg total) by mouth daily. 90 capsule 3   rosuvastatin  (CRESTOR ) 10 MG tablet TAKE 1 TABLET BY MOUTH DAILY 90 tablet 1   tirzepatide  (MOUNJARO ) 15 MG/0.5ML Pen Inject 15 mg into the skin once a week. 2 mL 11   traMADol  (ULTRAM -ER) 200 MG 24 hr tablet Take 200 mg by mouth daily.     No current facility-administered medications for this visit.    Allergies  Allergen Reactions   Bee Venom Anaphylaxis   Topiramate  Other (See Comments)     REVIEW OF SYSTEMS:   [X]  denotes positive finding, [ ]  denotes negative finding Cardiac  Comments:  Chest pain or chest pressure:    Shortness of breath upon  exertion:    Short of breath when lying flat:    Irregular heart rhythm:        Vascular    Pain in calf, thigh, or hip brought on by ambulation:    Pain in feet at night  that wakes you up from your sleep:     Blood clot in your veins:    Leg swelling:         Pulmonary    Oxygen at home:    Productive cough:     Wheezing:         Neurologic    Sudden weakness in arms or legs:     Sudden numbness in arms or legs:     Sudden onset of difficulty speaking or slurred speech:    Temporary loss of vision in one eye:     Problems with dizziness:         Gastrointestinal    Blood in stool:     Vomited blood:         Genitourinary    Burning when urinating:     Blood in urine:        Psychiatric    Major depression:         Hematologic    Bleeding problems:    Problems with blood clotting too easily:        Skin    Rashes or ulcers:        Constitutional    Fever or chills:      PHYSICAL EXAMINATION:  Vitals:   05/16/24 1021 05/16/24 1025  BP: 127/75 118/74  Pulse: 60   Temp: 98.2 F (36.8 C)   TempSrc: Temporal   Weight: 174 lb (78.9 kg)     General:  WDWN in NAD; vital signs documented above Gait: Not observed HENT: WNL, normocephalic Pulmonary: normal non-labored breathing Cardiac: regular HR Abdomen: soft, NT, no masses Skin: without rashes Vascular Exam/Pulses: symmetrical radial pulses Extremities: without ischemic changes, without Gangrene , without cellulitis; without open wounds;  Musculoskeletal: no muscle wasting or atrophy  Neurologic: A&O X 3; CN grossly intact Psychiatric:  The pt has Normal affect.   Non-Invasive Vascular Imaging:   B ICA 1-39% stenosis    ASSESSMENT/PLAN:: 58 y.o. female here for follow up for surveillance of carotid artery stenosis  No evidence of dissection seen on left carotid duplex.  Carotid duplex findings are stable with 1 to 39% stenosis bilaterally.  She will continue her aspirin  and statin daily.  We will  repeat carotid duplex in 2 years.  She knows to seek immediate medical attention if she experiences any strokelike symptoms in the meantime.  She will follow regularly with her PCP for management of chronic medical conditions including hypertension and hyperlipidemia and diabetes mellitus.   Kelsey Sender, PA-C [facility] [phone]  Clinic MD:   Gretta

## 2024-05-16 ENCOUNTER — Ambulatory Visit (HOSPITAL_COMMUNITY)
Admission: RE | Admit: 2024-05-16 | Discharge: 2024-05-16 | Disposition: A | Discharge status: Home / Self Care | Source: Ambulatory Visit | Attending: Vascular Surgery | Admitting: Vascular Surgery

## 2024-05-16 ENCOUNTER — Ambulatory Visit: Admitting: Physician Assistant

## 2024-05-16 VITALS — BP 118/74 | HR 60 | Temp 98.2°F | Wt 174.0 lb

## 2024-05-16 DIAGNOSIS — I6523 Occlusion and stenosis of bilateral carotid arteries: Secondary | ICD-10-CM | POA: Insufficient documentation

## 2024-05-17 ENCOUNTER — Ambulatory Visit (INDEPENDENT_AMBULATORY_CARE_PROVIDER_SITE_OTHER)

## 2024-05-17 ENCOUNTER — Ambulatory Visit (AMBULATORY_SURGERY_CENTER)

## 2024-05-17 ENCOUNTER — Encounter: Payer: Self-pay | Admitting: Gastroenterology

## 2024-05-17 VITALS — Ht 64.0 in | Wt 169.0 lb

## 2024-05-17 DIAGNOSIS — J309 Allergic rhinitis, unspecified: Secondary | ICD-10-CM

## 2024-05-17 DIAGNOSIS — Z860100 Personal history of colon polyps, unspecified: Secondary | ICD-10-CM

## 2024-05-17 DIAGNOSIS — H5213 Myopia, bilateral: Secondary | ICD-10-CM | POA: Diagnosis not present

## 2024-05-17 DIAGNOSIS — Z0100 Encounter for examination of eyes and vision without abnormal findings: Secondary | ICD-10-CM | POA: Diagnosis not present

## 2024-05-17 MED ORDER — NA SULFATE-K SULFATE-MG SULF 17.5-3.13-1.6 GM/177ML PO SOLN: 1.0000 | Freq: Once | ORAL | 0 refills | Status: AC

## 2024-05-17 NOTE — Progress Notes (Signed)
 No egg or soy allergy  known to patient  No issues known to pt with past sedation with any surgeries or procedures Patient denies ever being told they had issues or difficulty with intubation  No FH of Malignant Hyperthermia Pt is not on diet pills Pt is not on  home 02  Pt is not on blood thinners  Pt has issues with constipation with Mounjaro  and takes Dulcolax No A fib or A flutter Have any cardiac testing pending--no Pt can ambulate independently Pt denies use of chewing tobacco Discussed diabetic I weight loss medication holds Discussed NSAID holds Checked BMI Pt instructed to use Singlecare.com or GoodRx for a price reduction on prep  Patient's chart reviewed by Norleen Schillings CNRA prior to previsit and patient appropriate for the [facility].  Pre visit completed and red dot placed by patient's name on their procedure day (on provider's schedule).

## 2024-05-18 ENCOUNTER — Encounter: Payer: Self-pay | Admitting: Sports Medicine

## 2024-05-24 ENCOUNTER — Encounter: Payer: Self-pay | Admitting: Bariatrics

## 2024-05-24 ENCOUNTER — Ambulatory Visit: Admitting: Bariatrics

## 2024-05-24 VITALS — BP 116/78 | HR 74 | Temp 97.9°F | Ht 64.0 in | Wt 170.0 lb

## 2024-05-24 DIAGNOSIS — E669 Obesity, unspecified: Secondary | ICD-10-CM | POA: Diagnosis not present

## 2024-05-24 DIAGNOSIS — Z6829 Body mass index (BMI) 29.0-29.9, adult: Secondary | ICD-10-CM

## 2024-05-24 DIAGNOSIS — Z7985 Long-term (current) use of injectable non-insulin antidiabetic drugs: Secondary | ICD-10-CM | POA: Diagnosis not present

## 2024-05-24 DIAGNOSIS — E119 Type 2 diabetes mellitus without complications: Secondary | ICD-10-CM | POA: Diagnosis not present

## 2024-05-24 DIAGNOSIS — R809 Proteinuria, unspecified: Secondary | ICD-10-CM

## 2024-05-24 NOTE — Progress Notes (Signed)
 WEIGHT SUMMARY AND BIOMETRICS  Weight Lost Since Last Visit: 0  Weight Gained Since Last Visit: 0   Vitals Temp: 97.9 F (36.6 C) BP: 116/78 Pulse Rate: 74 SpO2: 100 %   Anthropometric Measurements Height: 5' 4 (1.626 m) Weight: 170 lb (77.1 kg) BMI (Calculated): 29.17 Weight at Last Visit: 170lb Weight Lost Since Last Visit: 0 Weight Gained Since Last Visit: 0 Starting Weight: 260lb Total Weight Loss (lbs): 90 lb (40.8 kg)   Body Composition  Body Fat %: 41 % Fat Mass (lbs): 69.8 lbs Muscle Mass (lbs): 95.2 lbs Total Body Water (lbs): 67 lbs Visceral Fat Rating : 10   Other Clinical Data Fasting: no Labs: no Today's Visit #: 20 Starting Date: 09/16/22    OBESITY Kelsey Snow is here to discuss her progress with her obesity treatment plan along with follow-up of her obesity related diagnoses.    Nutrition Plan: the Category 3 plan - 0% adherence.  Current exercise: cardiovascular workout on exercise equipment and treadmill.  Interim History:  Her weight remains the same. She is doing better and has gotten back to the gym. She states that her eating has been bad.  I reviewed her bioimpedance and her muscle mass is slightly down but less so than what it had been. Eating all of the food on the plan., Protein intake is as prescribed, and Water intake is adequate.   Pharmacotherapy: Kelsey Snow is on Mounjaro  15 mg SQ weekly Adverse side effects: None Hunger is moderately controlled.  Cravings are moderately controlled.  Assessment/Plan:    Type II Diabetes HgbA1c is at goal. Last A1c was 5.4 CBGs: Not checking      Episodes of hypoglycemia: no Medication(s): Mounjaro  15 mg SQ weekly  Lab Results  Component Value Date   HGBA1C 5.4 04/14/2024   HGBA1C 5.6 01/19/2024   HGBA1C 5.6 10/15/2023   Lab Results  Component Value Date   MICROALBUR 80  01/24/2024   LDLCALC 89 01/19/2024   CREATININE 0.70 01/19/2024   No results found for: GFR  Plan: Continue Mounjaro  15 mg SQ weekly Will keep all carbohydrates low both sweets and starches.  Aim for 7 to 9 hours of sleep nightly.  Eat more low glycemic index foods.  She will eat her protein first.  She will have a protein shake if she does not eat. Will resume tracking her calories and protein on a regular basis. She will resume her exercise regimen approximately 3 times per week   Generalized Obesity: Current BMI BMI (Calculated): 29.17   Pharmacotherapy Plan Continue  Mounjaro  15 mg SQ weekly  Kelsey Snow is currently in the action stage of change. As such, her goal is to continue with weight loss efforts.  She has agreed to the Category 3 plan.  Exercise goals: All adults should avoid inactivity. Some physical activity is better than none, and adults who  participate in any amount of physical activity gain some health benefits.  Behavioral modification strategies: increasing lean protein intake, meal planning , better snacking choices, planning for success, increasing fiber rich foods, avoiding temptations, keep healthy foods in the home, weigh protein portions, and measure portion sizes.  Kelsey Snow has agreed to follow-up with our clinic in 4 weeks.     Objective:   VITALS: Per patient if applicable, see vitals. GENERAL: Alert and in no acute distress. CARDIOPULMONARY: No increased WOB. Speaking in clear sentences.  PSYCH: Pleasant and cooperative. Speech normal rate and rhythm. Affect is appropriate. Insight and judgement are appropriate. Attention is focused, linear, and appropriate.  NEURO: Oriented as arrived to appointment on time with no prompting.   Attestation Statements:   This was prepared with the assistance of Engineer, civil (consulting).  Occasional wrong-word or sound-a-like substitutions may have occurred due to the inherent limitations of voice recognition   Clayborne Daring, DO

## 2024-05-31 ENCOUNTER — Ambulatory Visit
Admission: RE | Admit: 2024-05-31 | Discharge: 2024-05-31 | Disposition: A | Discharge status: Home / Self Care | Attending: Family Medicine | Admitting: Family Medicine

## 2024-05-31 VITALS — BP 132/86 | HR 69 | Temp 98.0°F | Resp 20

## 2024-05-31 DIAGNOSIS — R21 Rash and other nonspecific skin eruption: Secondary | ICD-10-CM | POA: Diagnosis not present

## 2024-05-31 MED ORDER — METHYLPREDNISOLONE 4 MG PO TBPK: ORAL_TABLET | ORAL | 0 refills | Status: DC

## 2024-05-31 NOTE — ED Provider Notes (Signed)
 [facility]    CSN: [id number] Arrival date & time: 05/31/24  1316      History   Chief Complaint Chief Complaint  Patient presents with   Rash    HPI Kelsey Snow is a 58 y.o. female.   HPI 58 year old female presents with a rash of face, neck, chest/breast 3 days.  PMH significant for HTN, CTS, and carotid artery dissection.  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) 01/24/2024   Cheilitis/stomatitis 10/15/2023   Benign paroxysmal positional vertigo 06/08/2023   Hashimoto's thyroiditis 06/01/2023   Verruca left thigh 12/10/2022   Rhinitis 12/03/2022   BMI 40.0-44.9, adult (HCC) 11/18/2022   Type 2 diabetes mellitus with obesity (HCC) 11/18/2022   Hypertension associated with type 2 diabetes mellitus (HCC) 11/18/2022   Other fatigue 09/16/2022   SOB (shortness of breath) on exertion 09/16/2022   B12 deficiency 09/16/2022   Vitamin D  deficiency 09/16/2022   Absolute anemia 09/16/2022   Seborrheic keratosis 08/14/2022   Benign essential hypertension 08/14/2022   Primary osteoarthritis of first carpometacarpal joint of left hand 05/21/2022   COVID-19 04/16/2021   Tinnitus 11/13/2020   Lesion of dorsum of nose 08/12/2020   Hemangioma 05/27/2020   Insomnia 05/09/2020   History of endometrial ablation 01/10/2020   Epigastric pain 12/07/2019   Closed  fracture of base of fifth metatarsal bone of left foot with nonunion 12/16/2017   Lumbar radiculopathy 11/29/2017   Chronic pain syndrome 11/26/2016   Facet arthropathy, cervical 06/05/2016   Postlaminectomy syndrome of lumbar region 05/01/2015   Carpal tunnel syndrome, left, status post right carpal tunnel release 11/29/2014   Foraminal stenosis of cervical region 05/03/2014   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   Cervical spondylosis without myelopathy 04/13/2014   Perennial allergic rhinitis 11/16/2013   Obsessive compulsive disorder with depression and anxiety 10/10/2013   Hypothyroidism 10/10/2013   Annual physical exam 10/10/2013   Lumbar degenerative disc disease 10/10/2013   Hypercholesterolemia 05/12/2013   Esophageal reflux 03/25/2013    Past Surgical History:  Procedure Laterality Date   BACK SURGERY     L5 to S1 discectomy   ECTOPIC PREGNANCY SURGERY     ENDOMETRIAL ABLATION     ESOPHAGOGASTRODUODENOSCOPY     in 20's had pepic ulcers    TUBAL LIGATION     TUBAL LIGATION     UPPER GASTROINTESTINAL ENDOSCOPY      OB History     Gravida  2   Para      Term      Preterm      AB      Living         SAB      IAB      Ectopic  Multiple      Live Births               Home Medications    Prior to Admission medications   Medication Sig Start Date End Date Taking? Authorizing Provider  methylPREDNISolone  (MEDROL  DOSEPAK) 4 MG TBPK tablet Take as directed 05/31/24  Yes Teddy Sharper, FNP  Ascorbic Acid (VITAMIN C GUMMIES PO) Take by mouth.    [provider]  aspirin  81 MG EC tablet TAKE 1 TABLET (81 MG TOTAL) BY MOUTH DAILY. 08/22/15   Curtis Debby PARAS, MD  azelastine  (ASTELIN ) 0.1 % nasal spray Place 2 sprays into both nostrils 2 (two) times daily. Use in each nostril as directed 12/10/23   Lorin Norris, MD  calcium -vitamin D  (OSCAL WITH D) 500-5 MG-MCG tablet Take 1 tablet by mouth.    [provider]   EPINEPHrine  0.3 mg/0.3 mL IJ SOAJ injection Inject 0.3 mg into the muscle as needed for anaphylaxis. 05/31/23   Curtis Debby PARAS, MD  fluticasone  (FLONASE ) 50 MCG/ACT nasal spray SPRAY 2 SPRAYS IN EACH NOSTRIL DAILY 09/21/23   Curtis Debby PARAS, MD  ipratropium (ATROVENT ) 0.06 % nasal spray Place 2 sprays into both nostrils 4 (four) times daily. 12/10/23   Lorin Norris, MD  levothyroxine  (SYNTHROID ) 112 MCG tablet TAKE 1 TABLET BY MOUTH DAILY BEFORE BREAKFAST 10/25/23   Curtis Debby PARAS, MD  Magnesium 100 MG CAPS Take by mouth.    [provider]  omeprazole  (PRILOSEC) 40 MG capsule Take 1 capsule (40 mg total) by mouth daily. 08/09/23   Curtis Debby PARAS, MD  rosuvastatin  (CRESTOR ) 10 MG tablet TAKE 1 TABLET BY MOUTH DAILY 12/03/23   Curtis Debby PARAS, MD  tirzepatide  (MOUNJARO ) 15 MG/0.5ML Pen Inject 15 mg into the skin once a week. 12/13/23   Curtis Debby PARAS, MD  traMADol  (ULTRAM -ER) 200 MG 24 hr tablet Take 200 mg by mouth daily.    [provider]    Family History Family History  Problem Relation Age of Onset   Asthma Mother    Allergic rhinitis Mother    Depression Mother    Pancreatic cancer Mother    Hypothyroidism Mother    Varicose Veins Mother    Colon polyps Mother    High blood pressure Mother    Aneurysm Father    AAA (abdominal aortic aneurysm) Father    Sudden death Father    Anxiety disorder Father    Drug abuse Sister    Hyperlipidemia Brother    Alcohol abuse Maternal Uncle    Diabetes Maternal Grandmother    Esophageal cancer Neg Hx    Colon cancer Neg Hx    Rectal cancer Neg Hx    Stomach cancer Neg Hx     Social History Social History   Tobacco Use   Smoking status: Never    Passive exposure: Never   Smokeless tobacco: Never  Vaping Use   Vaping status: Never Used  Substance Use Topics   Alcohol use: Yes    Alcohol/week: 6.0 standard drinks of alcohol    Types: 6 Standard drinks or equivalent per  week    Comment: weekly   Drug use: No     Allergies   Bee venom and Topiramate    Review of Systems Review of Systems  Skin:  Positive for rash.  All other systems reviewed and are negative.    Physical Exam Triage Vital Signs ED Triage Vitals  Encounter Vitals Group     BP  Girls Systolic BP Percentile      Girls Diastolic BP Percentile      Boys Systolic BP Percentile      Boys Diastolic BP Percentile      Pulse      Resp      Temp      Temp src      SpO2      Weight      Height      Head Circumference      Peak Flow      Pain Score      Pain Loc      Pain Education      Exclude from Growth Chart    No data found.  Updated Vital Signs BP 132/86   Pulse 69   Temp 98 F (36.7 C)   Resp 20   LMP 02/26/2014   SpO2 98%    Physical Exam Vitals and nursing note reviewed.  Constitutional:      Appearance: Normal appearance. She is obese. She is not ill-appearing.  HENT:     Head: Normocephalic and atraumatic.     Mouth/Throat:     Mouth: Mucous membranes are moist.     Pharynx: Oropharynx is clear.  Eyes:     Extraocular Movements: Extraocular movements intact.     Conjunctiva/sclera: Conjunctivae normal.     Pupils: Pupils are equal, round, and reactive to light.  Cardiovascular:     Rate and Rhythm: Normal rate and regular rhythm.     Pulses: Normal pulses.     Heart sounds: Normal heart sounds.  Pulmonary:     Effort: Pulmonary effort is normal.     Breath sounds: Normal breath sounds. No wheezing, rhonchi or rales.  Musculoskeletal:        General: Normal range of motion.     Cervical back: Normal range of motion and neck supple.  Skin:    General: Skin is warm and dry.     Comments: Face/neck (right sided) and upper chest: Pruritic, erythematous, maculopapular eruption-please see images below  Neurological:     General: No focal deficit present.     Mental Status: She is alert and oriented to person, place, and time. Mental status is  at baseline.  Psychiatric:        Mood and Affect: Mood normal.        Behavior: Behavior normal.      UC Treatments / Results  Labs (all labs ordered are listed, but only abnormal results are displayed) Labs Reviewed - No data to display  EKG   Radiology No results found.  Procedures Procedures (including critical care time)  Medications Ordered in UC Medications - No data to display  Initial Impression / Assessment and Plan / UC Course  I have reviewed the triage vital signs and the nursing notes.  Pertinent labs & imaging results that were available during my care of the patient were reviewed by me and considered in my medical decision making (see chart for details).     MDM: 1.  Rash and nonspecific skin eruption-Rx Medrol  Dosepak: Take as directed. Advised patient to take medication as directed with food to completion.  Encouraged increase daily water intake to 64 ounces per day while taking this medication.  Advised if symptoms worsen and/or unresolved please follow-up with your PCP or here for further evaluation.  Patient discharged home, hemodynamically stable. Final Clinical Impressions(s) / UC Diagnoses   Final diagnoses:  Rash and nonspecific skin eruption  Discharge Instructions      Advised patient to take medication as directed with food to completion.  Encouraged increase daily water intake to 64 ounces per day while taking this medication.  Advised if symptoms worsen and/or unresolved please follow-up with your PCP or here for further evaluation.     ED Prescriptions     Medication Sig Dispense Auth. Provider   methylPREDNISolone  (MEDROL  DOSEPAK) 4 MG TBPK tablet Take as directed 1 each Teddy Sharper, FNP      PDMP not reviewed this encounter.   Teddy Sharper, FNP 05/31/24 1358

## 2024-05-31 NOTE — ED Triage Notes (Addendum)
 Pt presents to uc with co of rash on race, neck, and chest since Sunday. Pt also reports a cough and chest tightness as well. Denies any new food, or soaps.

## 2024-05-31 NOTE — Discharge Instructions (Addendum)
 Advised patient to take medication as directed with food to completion.  Encouraged increase daily water intake to 64 ounces per day while taking this medication. Advised if symptoms worsen and/or unresolved please follow-up with your PCP or here for further evaluation.

## 2024-06-02 DIAGNOSIS — M5451 Vertebrogenic low back pain: Secondary | ICD-10-CM | POA: Diagnosis not present

## 2024-06-02 DIAGNOSIS — G894 Chronic pain syndrome: Secondary | ICD-10-CM | POA: Diagnosis not present

## 2024-06-02 DIAGNOSIS — M7918 Myalgia, other site: Secondary | ICD-10-CM | POA: Diagnosis not present

## 2024-06-02 DIAGNOSIS — M7061 Trochanteric bursitis, right hip: Secondary | ICD-10-CM | POA: Diagnosis not present

## 2024-06-02 DIAGNOSIS — Z79899 Other long term (current) drug therapy: Secondary | ICD-10-CM | POA: Diagnosis not present

## 2024-06-02 DIAGNOSIS — M5416 Radiculopathy, lumbar region: Secondary | ICD-10-CM | POA: Diagnosis not present

## 2024-06-02 DIAGNOSIS — M961 Postlaminectomy syndrome, not elsewhere classified: Secondary | ICD-10-CM | POA: Diagnosis not present

## 2024-06-05 ENCOUNTER — Ambulatory Visit (INDEPENDENT_AMBULATORY_CARE_PROVIDER_SITE_OTHER): Payer: Self-pay

## 2024-06-05 DIAGNOSIS — J309 Allergic rhinitis, unspecified: Secondary | ICD-10-CM | POA: Diagnosis not present

## 2024-06-12 ENCOUNTER — Other Ambulatory Visit: Payer: Self-pay | Admitting: Sports Medicine

## 2024-06-13 ENCOUNTER — Encounter: Payer: Self-pay | Admitting: Gastroenterology

## 2024-06-13 ENCOUNTER — Ambulatory Visit: Admitting: Gastroenterology

## 2024-06-13 VITALS — BP 121/65 | HR 58 | Temp 97.0°F | Resp 11 | Ht 64.0 in | Wt 169.0 lb

## 2024-06-13 DIAGNOSIS — Z1211 Encounter for screening for malignant neoplasm of colon: Secondary | ICD-10-CM

## 2024-06-13 DIAGNOSIS — I10 Essential (primary) hypertension: Secondary | ICD-10-CM | POA: Diagnosis not present

## 2024-06-13 DIAGNOSIS — Z860101 Personal history of adenomatous and serrated colon polyps: Secondary | ICD-10-CM | POA: Diagnosis not present

## 2024-06-13 DIAGNOSIS — Z860100 Personal history of colon polyps, unspecified: Secondary | ICD-10-CM

## 2024-06-13 DIAGNOSIS — D124 Benign neoplasm of descending colon: Secondary | ICD-10-CM

## 2024-06-13 DIAGNOSIS — K635 Polyp of colon: Secondary | ICD-10-CM

## 2024-06-13 DIAGNOSIS — K5730 Diverticulosis of large intestine without perforation or abscess without bleeding: Secondary | ICD-10-CM | POA: Diagnosis not present

## 2024-06-13 DIAGNOSIS — E119 Type 2 diabetes mellitus without complications: Secondary | ICD-10-CM | POA: Diagnosis not present

## 2024-06-13 DIAGNOSIS — K648 Other hemorrhoids: Secondary | ICD-10-CM

## 2024-06-13 DIAGNOSIS — K641 Second degree hemorrhoids: Secondary | ICD-10-CM

## 2024-06-13 DIAGNOSIS — F419 Anxiety disorder, unspecified: Secondary | ICD-10-CM | POA: Diagnosis not present

## 2024-06-13 DIAGNOSIS — F32A Depression, unspecified: Secondary | ICD-10-CM | POA: Diagnosis not present

## 2024-06-13 MED ORDER — SODIUM CHLORIDE 0.9 % IV SOLN: 500.0000 mL | Freq: Once | INTRAVENOUS | Status: DC

## 2024-06-13 NOTE — Progress Notes (Signed)
 GASTROENTEROLOGY PROCEDURE H&P NOTE   Primary Care Physician: Curtis Debby PARAS, MD    Reason for Procedure:  Colon polyp surveillance  Plan:    Colonoscopy  Patient is appropriate for endoscopic procedure(s) in the ambulatory ([facility]) setting.  The nature of the procedure, as well as the risks, benefits, and alternatives were carefully and thoroughly reviewed with the patient. Ample time for discussion and questions allowed. The patient understood, was satisfied, and agreed to proceed.     HPI: Kelsey Snow is a 58 y.o. female who presents for colonoscopy for ongoing colon polyp surveillance and colon cancer screening.  No active GI symptoms.  No known family history of colon cancer or related malignancy.  Patient is otherwise without complaints or active issues today.  Last colonoscopy was 12/2019 and notable for 3 small 3-6 mm ascending colon polyps (adenomas), 3 mm sigmoid hyperplastic polyp, internal hemorrhoids and hypertrophied anal papilla, normal TI.  Colon was moderately tortuous requiring manual pressure for advancement.  Past Medical History:  Diagnosis Date   Anemia    Anxiety    Arthritis    Back pain    Carpal tunnel syndrome    Depression    Diabetes (HCC)    Essential hypertension, benign 03/07/2015   Fibromuscular dysplasia of carotid artery (HCC)    GERD (gastroesophageal reflux disease)    History of degenerative disc disease    Hx of ectopic pregnancy    Hyperlipidemia    Hypothyroidism    Joint pain    Lumbosacral spondylolysis    Obese    OCD (obsessive compulsive disorder)    Thyroid  disease     Past Surgical History:  Procedure Laterality Date   BACK SURGERY     L5 to S1 discectomy   ECTOPIC PREGNANCY SURGERY     ENDOMETRIAL ABLATION     ESOPHAGOGASTRODUODENOSCOPY     in 20's had pepic ulcers    TUBAL LIGATION     TUBAL LIGATION     UPPER GASTROINTESTINAL ENDOSCOPY      Prior to Admission medications   Medication Sig Start  Date End Date Taking? Authorizing Provider  Ascorbic Acid (VITAMIN C GUMMIES PO) Take by mouth.   Yes [provider]  aspirin  81 MG EC tablet TAKE 1 TABLET (81 MG TOTAL) BY MOUTH DAILY. 08/22/15  Yes Curtis Debby PARAS, MD  azelastine  (ASTELIN ) 0.1 % nasal spray Place 2 sprays into both nostrils 2 (two) times daily. Use in each nostril as directed 12/10/23  Yes Lorin Norris, MD  calcium -vitamin D  (OSCAL WITH D) 500-5 MG-MCG tablet Take 1 tablet by mouth.   Yes [provider]  ipratropium (ATROVENT ) 0.06 % nasal spray Place 2 sprays into both nostrils 4 (four) times daily. 12/10/23  Yes Lorin Norris, MD  levothyroxine  (SYNTHROID ) 112 MCG tablet TAKE 1 TABLET BY MOUTH DAILY BEFORE BREAKFAST 10/25/23  Yes Curtis Debby PARAS, MD  Magnesium 100 MG CAPS Take by mouth.   Yes [provider]  omeprazole  (PRILOSEC) 40 MG capsule Take 1 capsule (40 mg total) by mouth daily. 08/09/23  Yes Curtis Debby PARAS, MD  rosuvastatin  (CRESTOR ) 10 MG tablet TAKE 1 TABLET BY MOUTH DAILY 06/12/24  Yes Alvan Dorothyann BIRCH, MD  traMADol  (ULTRAM -ER) 200 MG 24 hr tablet Take 200 mg by mouth daily.   Yes [provider]  EPINEPHrine  0.3 mg/0.3 mL IJ SOAJ injection Inject 0.3 mg into the muscle as needed for anaphylaxis. 05/31/23   Curtis Debby PARAS, MD  fluticasone  (  FLONASE ) 50 MCG/ACT nasal spray SPRAY 2 SPRAYS IN EACH NOSTRIL DAILY 09/21/23   Curtis Debby PARAS, MD  tirzepatide  (MOUNJARO ) 15 MG/0.5ML Pen Inject 15 mg into the skin once a week. 12/13/23   Curtis Debby PARAS, MD    Current Outpatient Medications  Medication Sig Dispense Refill   Ascorbic Acid (VITAMIN C GUMMIES PO) Take by mouth.     aspirin  81 MG EC tablet TAKE 1 TABLET (81 MG TOTAL) BY MOUTH DAILY. 90 tablet 3   azelastine  (ASTELIN ) 0.1 % nasal spray Place 2 sprays into both nostrils 2 (two) times daily. Use in each nostril as directed 30 mL 12   calcium -vitamin D  (OSCAL WITH D) 500-5 MG-MCG  tablet Take 1 tablet by mouth.     ipratropium (ATROVENT ) 0.06 % nasal spray Place 2 sprays into both nostrils 4 (four) times daily. 15 mL 12   levothyroxine  (SYNTHROID ) 112 MCG tablet TAKE 1 TABLET BY MOUTH DAILY BEFORE BREAKFAST 90 tablet 3   Magnesium 100 MG CAPS Take by mouth.     omeprazole  (PRILOSEC) 40 MG capsule Take 1 capsule (40 mg total) by mouth daily. 90 capsule 3   rosuvastatin  (CRESTOR ) 10 MG tablet TAKE 1 TABLET BY MOUTH DAILY 90 tablet 2   traMADol  (ULTRAM -ER) 200 MG 24 hr tablet Take 200 mg by mouth daily.     EPINEPHrine  0.3 mg/0.3 mL IJ SOAJ injection Inject 0.3 mg into the muscle as needed for anaphylaxis. 1 each 11   fluticasone  (FLONASE ) 50 MCG/ACT nasal spray SPRAY 2 SPRAYS IN EACH NOSTRIL DAILY 16 mL 11   tirzepatide  (MOUNJARO ) 15 MG/0.5ML Pen Inject 15 mg into the skin once a week. 2 mL 11   Current Facility-Administered Medications  Medication Dose Route Frequency Provider Last Rate Last Admin   0.9 %  sodium chloride  infusion  500 mL Intravenous Once Tynesia Harral V, DO        Allergies as of 06/13/2024 - Review Complete 06/13/2024  Allergen Reaction Noted   Bee venom Anaphylaxis 05/18/2014   Topiramate  Other (See Comments) 06/03/2018    Family History  Problem Relation Age of Onset   Asthma Mother    Allergic rhinitis Mother    Depression Mother    Pancreatic cancer Mother    Hypothyroidism Mother    Varicose Veins Mother    Colon polyps Mother    High blood pressure Mother    Aneurysm Father    AAA (abdominal aortic aneurysm) Father    Sudden death Father    Anxiety disorder Father    Drug abuse Sister    Hyperlipidemia Brother    Alcohol abuse Maternal Uncle    Diabetes Maternal Grandmother    Esophageal cancer Neg Hx    Colon cancer Neg Hx    Rectal cancer Neg Hx    Stomach cancer Neg Hx     Social History   Socioeconomic History   Marital status: Married    Spouse name: Not on file   Number of children: 1   Years of education: 12    Highest education level: Some college, no degree  Occupational History    Employer: [employer]  Tobacco Use   Smoking status: Never    Passive exposure: Never   Smokeless tobacco: Never  Vaping Use   Vaping status: Never Used  Substance and Sexual Activity   Alcohol use: Yes    Alcohol/week: 6.0 standard drinks of alcohol    Types: 6 Standard drinks or equivalent per week  Comment: weekly   Drug use: No   Sexual activity: Not on file  Other Topics Concern   Not on file  Social History Narrative   Not on file   Social Drivers of Health   Financial Resource Strain: Low Risk  (05/08/2024)   Overall Financial Resource Strain (CARDIA)    Difficulty of Paying Living Expenses: Not hard at all  Food Insecurity: No Food Insecurity (05/08/2024)   Hunger Vital Sign    Worried About Running Out of Food in the Last Year: Never true    Ran Out of Food in the Last Year: Never true  Transportation Needs: No Transportation Needs (05/08/2024)   PRAPARE - Administrator, Civil Service (Medical): No    Lack of Transportation (Non-Medical): No  Physical Activity: Sufficiently Active (05/08/2024)   Exercise Vital Sign    Days of Exercise per Week: 3 days    Minutes of Exercise per Session: 50 min  Stress: No Stress Concern Present (05/08/2024)   [employer] of Occupational Health - Occupational Stress Questionnaire    Feeling of Stress: Only a little  Social Connections: Moderately Isolated (05/08/2024)   Social Connection and Isolation Panel    Frequency of Communication with Friends and Family: Twice a week    Frequency of Social Gatherings with Friends and Family: Once a week    Attends Religious Services: Never    Database administrator or Organizations: No    Attends Engineer, structural: Not on file    Marital Status: Married  Catering manager Violence: Not At Risk (04/24/2024)   Received from [facility]   HITS    Over the last 12 months how  often did your partner physically hurt you?: Never    Over the last 12 months how often did your partner insult you or talk down to you?: Never    Over the last 12 months how often did your partner threaten you with physical harm?: Never    Over the last 12 months how often did your partner scream or curse at you?: Never    Physical Exam: Vital signs in last 24 hours: @BP  121/75   Pulse 62   Temp (!) 97 F (36.1 C)   Ht 5' 4 (1.626 m)   Wt 169 lb (76.7 kg)   LMP 02/26/2014   SpO2 98%   BMI 29.01 kg/m  GEN: NAD EYE: Sclerae anicteric ENT: MMM CV: Non-tachycardic Pulm: CTA b/l GI: Soft, NT/ND NEURO:  Alert & Oriented x 3   Sandor Flatter, DO [facility]   06/13/2024 7:52 AM

## 2024-06-13 NOTE — Patient Instructions (Signed)

## 2024-06-13 NOTE — Progress Notes (Signed)
 Pt's states no medical or surgical changes since previsit or office visit.

## 2024-06-13 NOTE — Progress Notes (Signed)
 Called to room to assist during endoscopic procedure.  Patient ID and intended procedure confirmed with present staff. Received instructions for my participation in the procedure from the performing physician.

## 2024-06-13 NOTE — Progress Notes (Signed)
 Vss nad trans to pacu

## 2024-06-13 NOTE — Op Note (Signed)
  [facility] Patient Name: Kelsey Snow Procedure Date: 06/13/2024 7:59 AM MRN: [id number] Endoscopist: Sandor Flatter , MD, [id number] Age: 58 Referring MD:  Date of Birth: 06-20-1966 Gender: Female Account #: [id number] Procedure:                Colonoscopy Indications:              Surveillance: Personal history of adenomatous                            polyps on last colonoscopy > 3 years ago                           Last colonoscopy was 12/2019 and notable for 3 small                            3-6 mm ascending colon polyps (adenomas), 3 mm                            sigmoid hyperplastic polyp, internal hemorrhoids                            and hypertrophied anal papilla, normal TI. Colon                            was moderately tortuous requiring manual pressure                            for advancement. Medicines:                Monitored Anesthesia Care Procedure:                Pre-Anesthesia Assessment:                           - Prior to the procedure, a History and Physical                            was performed, and patient medications and                            allergies were reviewed. The patient's tolerance of                            previous anesthesia was also reviewed. The risks                            and benefits of the procedure and the sedation                            options and risks were discussed with the patient.                            All questions were answered, and informed consent  was obtained. Prior Anticoagulants: The patient has                            taken no anticoagulant or antiplatelet agents. ASA                            Grade Assessment: II - A patient with mild systemic                            disease. After reviewing the risks and benefits,                            the patient was deemed in satisfactory condition to                            undergo the procedure.                            After obtaining informed consent, the colonoscope                            was passed under direct vision. Throughout the                            procedure, the patient's blood pressure, pulse, and                            oxygen saturations were monitored continuously. The                            Olympus Scope SN: [id number] was introduced through                            the anus and advanced to the the terminal ileum.                            The colonoscopy was performed without difficulty.                            The patient tolerated the procedure well. The                            quality of the bowel preparation was good. The                            terminal ileum, ileocecal valve, appendiceal                            orifice, and rectum were photographed. Scope In: 8:04:31 AM Scope Out: 8:23:33 AM Scope Withdrawal Time: 0 hours 13 minutes 42 seconds  Total Procedure Duration: 0 hours 19 minutes 2 seconds  Findings:                 The perianal and digital rectal examinations were  normal.                           A 3 mm polyp was found in the descending colon. The                            polyp was sessile. The polyp was removed with a                            piecemeal technique using a cold snare. Resection                            and retrieval were complete.                           A few small-mouthed diverticula were found in the                            sigmoid colon and ascending colon.                           Non-bleeding internal hemorrhoids were found during                            retroflexion. The hemorrhoids were small.                           The terminal ileum appeared normal. Complications:            No immediate complications. Estimated Blood Loss:     Estimated blood loss was minimal. Impression:               - One 3 mm polyp in the descending colon, removed                             piecemeal using a cold snare. Resected and                            retrieved.                           - Diverticulosis in the sigmoid colon and in the                            ascending colon.                           - Non-bleeding internal hemorrhoids.                           - The examined portion of the ileum was normal. Recommendation:           - Patient has a contact number available for                            emergencies. The signs and symptoms of potential  delayed complications were discussed with the                            patient. Return to normal activities tomorrow.                            Written discharge instructions were provided to the                            patient.                           - Resume previous diet.                           - Continue present medications.                           - Await pathology results.                           - Repeat colonoscopy for surveillance based on                            pathology results.                           - Return to GI office PRN. Sandor Flatter, MD 06/13/2024 8:31:34 AM

## 2024-06-14 ENCOUNTER — Telehealth: Payer: Self-pay

## 2024-06-14 ENCOUNTER — Ambulatory Visit (INDEPENDENT_AMBULATORY_CARE_PROVIDER_SITE_OTHER): Payer: Self-pay

## 2024-06-14 DIAGNOSIS — J309 Allergic rhinitis, unspecified: Secondary | ICD-10-CM

## 2024-06-14 NOTE — Telephone Encounter (Signed)
 Left message on follow up call.

## 2024-06-15 ENCOUNTER — Ambulatory Visit: Payer: Self-pay | Admitting: Gastroenterology

## 2024-06-15 LAB — SURGICAL PATHOLOGY

## 2024-06-20 ENCOUNTER — Encounter: Payer: Self-pay | Admitting: Sports Medicine

## 2024-06-21 ENCOUNTER — Ambulatory Visit (INDEPENDENT_AMBULATORY_CARE_PROVIDER_SITE_OTHER): Admitting: Bariatrics

## 2024-06-21 ENCOUNTER — Ambulatory Visit (INDEPENDENT_AMBULATORY_CARE_PROVIDER_SITE_OTHER)

## 2024-06-21 ENCOUNTER — Encounter: Payer: Self-pay | Admitting: Bariatrics

## 2024-06-21 VITALS — BP 118/74 | HR 68 | Temp 97.8°F | Ht 64.0 in | Wt 170.0 lb

## 2024-06-21 DIAGNOSIS — Z7985 Long-term (current) use of injectable non-insulin antidiabetic drugs: Secondary | ICD-10-CM | POA: Diagnosis not present

## 2024-06-21 DIAGNOSIS — J309 Allergic rhinitis, unspecified: Secondary | ICD-10-CM | POA: Diagnosis not present

## 2024-06-21 DIAGNOSIS — Z6829 Body mass index (BMI) 29.0-29.9, adult: Secondary | ICD-10-CM

## 2024-06-21 DIAGNOSIS — E119 Type 2 diabetes mellitus without complications: Secondary | ICD-10-CM

## 2024-06-21 DIAGNOSIS — E669 Obesity, unspecified: Secondary | ICD-10-CM

## 2024-06-21 DIAGNOSIS — R809 Proteinuria, unspecified: Secondary | ICD-10-CM

## 2024-06-21 NOTE — Progress Notes (Signed)
 WEIGHT SUMMARY AND BIOMETRICS  Weight Lost Since Last Visit: 0  Weight Gained Since Last Visit: 0   Vitals Temp: 97.8 F (36.6 C) BP: 118/74 Pulse Rate: 68 SpO2: 99 %   Anthropometric Measurements Height: 5' 4 (1.626 m) Weight: 170 lb (77.1 kg) BMI (Calculated): 29.17 Weight at Last Visit: 170lb Weight Lost Since Last Visit: 0 Weight Gained Since Last Visit: 0 Starting Weight: 260lb Total Weight Loss (lbs): 90 lb (40.8 kg)   Body Composition  Body Fat %: 41.5 % Fat Mass (lbs): 70.8 lbs Muscle Mass (lbs): 94.6 lbs Total Body Water (lbs): 68 lbs Visceral Fat Rating : 10   Other Clinical Data Fasting: no Labs: no Today's Visit #: 21 Starting Date: 09/16/22    OBESITY Kelsey Snow is here to discuss her progress with her obesity treatment plan along with follow-up of her obesity related diagnoses.    Nutrition Plan: the Category 3 plan - 40% adherence.  Current exercise: goes to the gym for exercise.  Interim History:  Her weight remains the same as her last visit. She is eating better and doing more Mediterranean foods.  Eating all of the food on the plan., Protein intake is as prescribed, Is not skipping meals, Not journaling consistently., and Water intake is adequate.   Pharmacotherapy: Kelsey Snow is on Mounjaro  15 mg SQ weekly Adverse side effects: None Hunger is moderately controlled.  Cravings are moderately controlled.  Assessment/Plan:   Type II Diabetes HgbA1c is not at goal. Last A1c was 5.4 Episodes of hypoglycemia: no Medication(s): Mounjaro  15 mg SQ weekly  Lab Results  Component Value Date   HGBA1C 5.4 04/14/2024   HGBA1C 5.6 01/19/2024   HGBA1C 5.6 10/15/2023   Lab Results  Component Value Date   MICROALBUR 80 01/24/2024   LDLCALC 89 01/19/2024   CREATININE 0.70 01/19/2024   No results found for: GFR  Plan: Continue  Mounjaro  15 mg SQ weekly Will keep all carbohydrates low both sweets and starches.  Will continue exercise regimen to 30 to 60 minutes on most days of the week.  Aim for 7 to 9 hours of sleep nightly.  Eat more low glycemic index foods.  Continue exercise both cardio and resistance.  Will cook more at home.     Generalized Obesity: Current BMI BMI (Calculated): 29.17   Pharmacotherapy Plan Continue  Mounjaro  15 mg SQ weekly  Kelsey Snow is currently in the action stage of change. As such, her goal is to continue with weight loss efforts.  She has agreed to the Category 3 plan.  Exercise goals: All adults should avoid inactivity. Some physical activity is better than none, and adults who participate in any amount of physical activity gain some health benefits. She has returned to the gym for 3 days per week and has a new puppy.   Behavioral modification strategies: increasing lean protein intake, no  meal skipping, meal planning , increase water intake, better snacking choices, planning for success, increasing vegetables, weigh protein portions, measure portion sizes, and mindful eating.  Kelsey Snow has agreed to follow-up with our clinic in 4 weeks.   Objective:   VITALS: Per patient if applicable, see vitals. GENERAL: Alert and in no acute distress. CARDIOPULMONARY: No increased WOB. Speaking in clear sentences.  PSYCH: Pleasant and cooperative. Speech normal rate and rhythm. Affect is appropriate. Insight and judgement are appropriate. Attention is focused, linear, and appropriate.  NEURO: Oriented as arrived to appointment on time with no prompting.   Attestation Statements:   This was prepared with the assistance of Engineer, civil (consulting).  Occasional wrong-word or sound-a-like substitutions may have occurred due to the inherent limitations of voice recognition   Clayborne Daring, DO

## 2024-07-17 ENCOUNTER — Ambulatory Visit

## 2024-07-17 ENCOUNTER — Ambulatory Visit (INDEPENDENT_AMBULATORY_CARE_PROVIDER_SITE_OTHER)

## 2024-07-17 VITALS — BP 136/80 | Ht 64.0 in | Wt 170.0 lb

## 2024-07-17 DIAGNOSIS — J309 Allergic rhinitis, unspecified: Secondary | ICD-10-CM | POA: Diagnosis not present

## 2024-07-17 DIAGNOSIS — M1812 Unilateral primary osteoarthritis of first carpometacarpal joint, left hand: Secondary | ICD-10-CM | POA: Diagnosis not present

## 2024-07-17 NOTE — Progress Notes (Signed)
   Subjective:    Patient ID: Kelsey Snow, female    DOB: 58 y.o., 12/17/1965   MRN: [id number]  HPI  Chief Complaint: Left thumb pain  Patient with well-documented left first [facility] joint arthritis and is followed with Dr. Curtis.  Multiple corticosteroid injections in that joint in the past (last 05/09/24) Reports that this has bothered her for several years No significant changes in this recently No new injuries Has started reporting some numbness and tingling radiating into the first 3 digits of her left hand which is similar to the carpal tunnel previously had in her right.  She is status post transverse carpal ligament resection of the right hand in 2022 with excellent relief. Dislikes needles quite a bit.  Review of prior imaging of left wrist x-rays obtained on 05/20/2022 per my independent evaluation showing moderate narrowing of the ulnar side of the [facility] joint with underlying sclerosis note.  No significant osteophytic change surrounding this.  No significant scapho-trapezial degenerative changes.    Objective:   Physical Exam Vitals:   07/17/24 1101  BP: 136/80   Left hand/Fingers/Wrist ( compared to normal ) -Inspection: no swelling, erythema, ecchymoses. No bony deformity or atrophy of the hypothenar region -Palpation: TTP - DIP, - PIP, - MCP,  - metacarpals, - scaphoid/snuff box, - scaphoid tubercle -AROM/PROM: Full flexion, extension, supination, pronation of the wrist. Full flexion and extension of the fingers, with and without isolation of the DIP. -Strength: 5/5 flexion, 5/5 extension (radial), 5/5 pronation, 5/5 supination, 5/5 finger abduction (ulnar), 5/5 Ok sign (median) -sensation: intact on dorsum of hand (radial), 4th/5th digits (ulnar), 1st-3rd digits (median) -Special tests: - Finklestein, - Eichoff, + Tinel at wrist, + Durkin compression test, + CMC grind, + TFCC grind, - fovea sign    Assessment & Plan:   Kelsey Snow is a 58 y.o. 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  I discussed with her that unfortunately due to the closeness and proximity of her previous [facility] joint injection on 05/09/2024 that I am unable to perform another 1 of these today for.  I recommend follow-up in about 1 month and I am happy to provide this for her.  Conversely, she does also present today specifically complaining of symptoms consistent with carpal tunnel with a positive Tinel's and Durkan's at the wrist.  I suspect that injection of her median nerve with a corticosteroid will provide some relief to the nearby [facility] joint and I discussed with her that I may recommend this.  Will speak with other sports medicine colleagues to determine appropriateness of other injectable therapies for this particular problem to help her bridge the gap in the future as this seems to be a recurring issue for her having inadequate pain relief for 3 months between injections.  Also revisited the topic of surgery which she states she wishes to forestall at this time.

## 2024-07-19 ENCOUNTER — Ambulatory Visit: Admitting: Family Medicine

## 2024-07-19 ENCOUNTER — Ambulatory Visit: Admitting: Bariatrics

## 2024-07-24 ENCOUNTER — Ambulatory Visit: Admitting: Bariatrics

## 2024-07-31 ENCOUNTER — Encounter: Payer: Self-pay | Admitting: Bariatrics

## 2024-07-31 ENCOUNTER — Ambulatory Visit: Admitting: Bariatrics

## 2024-07-31 VITALS — BP 131/84 | HR 67 | Ht 64.0 in | Wt 169.0 lb

## 2024-07-31 DIAGNOSIS — E669 Obesity, unspecified: Secondary | ICD-10-CM

## 2024-07-31 DIAGNOSIS — Z6828 Body mass index (BMI) 28.0-28.9, adult: Secondary | ICD-10-CM

## 2024-07-31 DIAGNOSIS — E039 Hypothyroidism, unspecified: Secondary | ICD-10-CM | POA: Diagnosis not present

## 2024-07-31 DIAGNOSIS — E119 Type 2 diabetes mellitus without complications: Secondary | ICD-10-CM

## 2024-07-31 DIAGNOSIS — R809 Proteinuria, unspecified: Secondary | ICD-10-CM

## 2024-07-31 DIAGNOSIS — Z7985 Long-term (current) use of injectable non-insulin antidiabetic drugs: Secondary | ICD-10-CM

## 2024-07-31 DIAGNOSIS — Z6829 Body mass index (BMI) 29.0-29.9, adult: Secondary | ICD-10-CM

## 2024-07-31 NOTE — Progress Notes (Signed)
 WEIGHT SUMMARY AND BIOMETRICS  Weight Lost Since Last Visit: 1lb  Weight Gained Since Last Visit: 0   Vitals Temp: -- (could not obtain) BP: 131/84 Pulse Rate: 67 SpO2: 99 %   Anthropometric Measurements Height: 5' 4 (1.626 m) Weight: 169 lb (76.7 kg) BMI (Calculated): 28.99 Weight at Last Visit: 170lb Weight Lost Since Last Visit: 1lb Weight Gained Since Last Visit: 0 Starting Weight: 260lb Total Weight Loss (lbs): 91 lb (41.3 kg)   Body Composition  Body Fat %: 41.8 % Fat Mass (lbs): 70.8 lbs Muscle Mass (lbs): 93.6 lbs Total Body Water (lbs): 69.6 lbs Visceral Fat Rating : 10   Other Clinical Data Fasting: no Labs: no Today's Visit #: 22 Starting Date: 09/16/22    OBESITY Kelsey Snow is here to discuss her progress with her obesity treatment plan along with follow-up of her obesity related diagnoses.    Nutrition Plan: the Category 3 plan - 80% adherence.  Current exercise: goes to the gym  Interim History:  She is down 1 lb since her last visit. Her goal is 160 lbs.  Protein intake is as prescribed, Water intake is adequate., Denies polyphagia, and Denies excessive cravings.   Pharmacotherapy: Kelsey Snow is on Mounjaro  15 mg SQ weekly Adverse side effects: None Hunger is well controlled.  Cravings are well controlled.  Assessment/Plan:    Type II Diabetes HgbA1c is at goal. Last A1c was 5.4 Episodes of hypoglycemia: no Medication(s): Mounjaro  15 mg SQ weekly  Lab Results  Component Value Date   HGBA1C 5.4 04/14/2024   HGBA1C 5.6 01/19/2024   HGBA1C 5.6 10/15/2023   Lab Results  Component Value Date   MICROALBUR 80 01/24/2024   LDLCALC 89 01/19/2024   CREATININE 0.70 01/19/2024   No results found for: GFR  Plan: Continue and refill Mounjaro  15 mg SQ weekly Will keep all carbohydrates low both sweets and starches.  Will  continue exercise regimen to 30 to 60 minutes on most days of the week.  Aim for 7 to 9 hours of sleep nightly.  Eat more low glycemic index foods.    Hypothyroidism Stable.  Does not report symptoms associated with uncontrolled hypothyroidism. Medication(s): Levothyroxine  112 mcg daily. Lab Results  Component Value Date   TSH 2.540 01/19/2024    Plan: Continue levothyroxine  at current dose. Counseling: The correct way to take levothyroxine  is fasting, with water, separated by at least 30 minutes from breakfast, and separated by more than 4 hours from calcium , iron, multivitamins, acid reflux medications (PPIs).    Generalized Obesity: Current BMI BMI (Calculated): 28.99   Pharmacotherapy Plan Continue and refill  Mounjaro  15 mg SQ weekly  Kelsey Snow is currently in the action stage of change. As such, her goal is to continue with weight loss efforts.  She has agreed to the Category 3 plan.  Exercise goals: All adults should avoid inactivity.  Some physical activity is better than none, and adults who participate in any amount of physical activity gain some health benefits.  Behavioral modification strategies: increasing lean protein intake, no meal skipping, decrease eating out, meal planning , increase water intake, increasing vegetables, avoiding temptations, keep healthy foods in the home, weigh protein portions, measure portion sizes, and mindful eating.  Kelsey Snow has agreed to follow-up with our clinic in 4 weeks.      Objective:   VITALS: Per patient if applicable, see vitals. GENERAL: Alert and in no acute distress. CARDIOPULMONARY: No increased WOB. Speaking in clear sentences.  PSYCH: Pleasant and cooperative. Speech normal rate and rhythm. Affect is appropriate. Insight and judgement are appropriate. Attention is focused, linear, and appropriate.  NEURO: Oriented as arrived to appointment on time with no prompting.   Attestation Statements:   This was prepared with the  assistance of Engineer, civil (consulting).  Occasional wrong-word or sound-a-like substitutions may have occurred due to the inherent limitations of voice recognition   Clayborne Daring, DO

## 2024-08-09 ENCOUNTER — Encounter: Payer: Self-pay | Admitting: Bariatrics

## 2024-08-09 ENCOUNTER — Ambulatory Visit (INDEPENDENT_AMBULATORY_CARE_PROVIDER_SITE_OTHER): Admitting: Urgent Care

## 2024-08-09 ENCOUNTER — Encounter: Payer: Self-pay | Admitting: Urgent Care

## 2024-08-09 VITALS — BP 103/68 | HR 77 | Ht 64.0 in | Wt 172.0 lb

## 2024-08-09 DIAGNOSIS — I773 Arterial fibromuscular dysplasia: Secondary | ICD-10-CM | POA: Diagnosis not present

## 2024-08-09 DIAGNOSIS — Z23 Encounter for immunization: Secondary | ICD-10-CM

## 2024-08-09 DIAGNOSIS — R809 Proteinuria, unspecified: Secondary | ICD-10-CM

## 2024-08-09 DIAGNOSIS — Z79899 Other long term (current) drug therapy: Secondary | ICD-10-CM

## 2024-08-09 DIAGNOSIS — E1129 Type 2 diabetes mellitus with other diabetic kidney complication: Secondary | ICD-10-CM | POA: Diagnosis not present

## 2024-08-09 DIAGNOSIS — Z7985 Long-term (current) use of injectable non-insulin antidiabetic drugs: Secondary | ICD-10-CM

## 2024-08-09 DIAGNOSIS — Z9109 Other allergy status, other than to drugs and biological substances: Secondary | ICD-10-CM | POA: Insufficient documentation

## 2024-08-09 DIAGNOSIS — E039 Hypothyroidism, unspecified: Secondary | ICD-10-CM

## 2024-08-09 DIAGNOSIS — Z1231 Encounter for screening mammogram for malignant neoplasm of breast: Secondary | ICD-10-CM

## 2024-08-09 DIAGNOSIS — K219 Gastro-esophageal reflux disease without esophagitis: Secondary | ICD-10-CM

## 2024-08-09 MED ORDER — OMEPRAZOLE 20 MG PO CPDR
20.0000 mg | DELAYED_RELEASE_CAPSULE | Freq: Every day | ORAL | 3 refills | Status: AC
Start: 2024-08-09 — End: ?

## 2024-08-09 NOTE — Patient Instructions (Signed)
 Please schedule an annual physical in April  Schedule your mammogram. Decrease your omeprazole  to 20mg  daily.  Take your lab slip to your appointment next week

## 2024-08-09 NOTE — Progress Notes (Signed)
 Established Patient Office Visit  Subjective:  Patient ID: Kelsey Snow, female    DOB: [birth year]  Age: 58 y.o. MRN: [id number]  Chief Complaint  Patient presents with   Establish Care    HPI  Discussed the use of AI scribe software for clinical note transcription with the patient, who gave verbal consent to proceed.  History of Present Illness   Kelsey Snow is a 58 year old female who presents for a routine check-up and flu shot.  She plans to schedule a mammogram as part of her routine screening, with no history of abnormal results.  She has fibromuscular dysplasia in her neck and takes baby aspirin  daily as a preventive measure. She undergoes annual ultrasounds at [facility]. A dissection was previously found on the left side, but her condition has improved, and she now only requires follow-ups every two to three years.  She experiences seasonal allergies, which have worsened over time, necessitating allergy  injections at [facility]  in [facility]. She uses azelastine  nasal spray as needed, especially when dust and dander levels are high. No abnormal dryness or epistaxis.  She takes calcium  and vitamin D  supplements on Tuesdays and Thursdays, and magnesium on Mondays, Wednesdays, and Fridays as preventive measures, as she has not been diagnosed with osteopenia or osteoporosis. She has a history of anemia and is mindful of her nutrition, especially since she does not consume red meat.  She is on a stable dose of 112 mcg of thyroid  medication, which has been adjusted in the past due to weight fluctuations. Her thyroid  levels were last checked in April.  She takes 40 mg of omeprazole  for GERD and stomach issues. She takes it in the afternoon as she tends to graze more during that time. She is interested in decreasing this dose as she feels 20mg  may be equally effective.  She has a history of type 2 diabetes, with an A1c of 6.7 in January 2024.  She is currently on tirzepatide  (Mounjaro ) at 15 mg, which has helped her lose weight and manage her diabetes. Her A1c has improved to 5.4.  She engages in regular physical activity, going to the gym three days a week with a friend, where she does weight training and cardio exercises. She has lost significant weight and is close to her goal weight, which she last had when she met her husband.       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) 01/24/2024   Cheilitis/stomatitis 10/15/2023   Benign paroxysmal positional vertigo 06/08/2023   Hashimoto's thyroiditis 06/01/2023   Verruca left thigh 12/10/2022   Rhinitis 12/03/2022   BMI 40.0-44.9, adult (HCC) 11/18/2022   Type 2 diabetes mellitus with obesity 11/18/2022   Hypertension associated with type 2 diabetes mellitus (HCC) 11/18/2022   Other fatigue 09/16/2022   SOB (shortness of breath) on exertion 09/16/2022   B12 deficiency 09/16/2022   Vitamin D  deficiency 09/16/2022   Absolute anemia 09/16/2022   Seborrheic keratosis 08/14/2022   Benign essential hypertension 08/14/2022   Primary osteoarthritis of first carpometacarpal joint of left hand 05/21/2022   COVID-19 04/16/2021   Tinnitus 11/13/2020   Lesion of dorsum of nose 08/12/2020   Hemangioma 05/27/2020   Insomnia 05/09/2020   History of endometrial ablation 01/10/2020   Epigastric pain  12/07/2019   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 12/16/2017   Lumbar radiculopathy 11/29/2017   Chronic pain syndrome 11/26/2016   Facet arthropathy, cervical 06/05/2016   Postlaminectomy syndrome of lumbar region 05/01/2015   Carpal tunnel syndrome, left, status post right carpal tunnel release 11/29/2014   Foraminal stenosis of cervical region 05/03/2014    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   Cervical spondylosis without myelopathy 04/13/2014   Perennial allergic rhinitis 11/16/2013   Obsessive compulsive disorder with depression and anxiety 10/10/2013   Hypothyroidism 10/10/2013   Annual physical exam 10/10/2013   Lumbar degenerative disc disease 10/10/2013   Hypercholesterolemia 05/12/2013   Esophageal reflux 03/25/2013   Past Medical History:  Diagnosis Date   Allergy  8-9 yrs old   multiple bee stings, yellow jackets in [place]    Anemia    Anxiety    Arthritis    Back pain    Carpal tunnel syndrome    Depression    Diabetes (HCC)    Essential hypertension, benign 03/07/2015   Fibromuscular dysplasia of carotid artery    GERD (gastroesophageal reflux disease)    History of degenerative disc disease    Hx of ectopic pregnancy    Hyperlipidemia    Hypothyroidism    Joint pain    Lumbosacral spondylolysis    Obese    OCD (obsessive compulsive disorder)    Thyroid  disease    Past Surgical History:  Procedure Laterality Date   BACK SURGERY     L5 to S1 discectomy   ECTOPIC PREGNANCY SURGERY     ENDOMETRIAL ABLATION     ESOPHAGOGASTRODUODENOSCOPY     in 20's had pepic ulcers    SPINE SURGERY  07/22/13   L5/S1 disectomy   TUBAL LIGATION     TUBAL LIGATION     UPPER GASTROINTESTINAL ENDOSCOPY     Social History   Tobacco Use   Smoking status: Never    Passive exposure: Never   Smokeless tobacco: Never  Vaping Use   Vaping status: Never Used  Substance Use Topics   Alcohol use: Yes    Alcohol/week: 6.0 standard drinks of alcohol    Types: 6 Standard drinks or equivalent per week    Comment: weekly   Drug use: No      ROS: as noted in HPI  Objective:     BP 103/68   Pulse 77   Ht 5' 4 (1.626 m)   Wt 172 lb (78 kg)   LMP 02/26/2014   SpO2 97%   BMI 29.52 kg/m  BP Readings from Last 3 Encounters:  08/09/24 103/68  07/31/24 131/84  07/17/24 136/80   Wt Readings from Last 3 Encounters:   08/09/24 172 lb (78 kg)  07/31/24 169 lb (76.7 kg)  07/17/24 170 lb (77.1 kg)      Physical Exam Vitals and nursing note reviewed.  Constitutional:      General: She is not in acute distress.    Appearance: Normal appearance. She is not ill-appearing, toxic-appearing or diaphoretic.  HENT:     Head: Normocephalic and atraumatic.     Right Ear: External ear normal.     Left Ear: External ear normal.     Nose: Nose normal.     Mouth/Throat:     Mouth: Mucous membranes are moist.     Pharynx: Oropharynx is clear. No oropharyngeal exudate or posterior oropharyngeal erythema.  Eyes:     General: No scleral icterus.  Right eye: No discharge.        Left eye: No discharge.     Extraocular Movements: Extraocular movements intact.     Pupils: Pupils are equal, round, and reactive to light.  Neck:     Thyroid : No thyroid  mass, thyromegaly or thyroid  tenderness.  Cardiovascular:     Rate and Rhythm: Normal rate and regular rhythm.     Pulses: Normal pulses.  Pulmonary:     Effort: Pulmonary effort is normal. No respiratory distress.     Breath sounds: Normal breath sounds. No stridor. No wheezing or rhonchi.  Musculoskeletal:     Cervical back: Normal range of motion and neck supple. No rigidity or tenderness.     Right lower leg: No edema.     Left lower leg: No edema.  Lymphadenopathy:     Cervical: No cervical adenopathy.  Skin:    General: Skin is warm and dry.     Coloration: Skin is not jaundiced.     Findings: No bruising, erythema or rash.  Neurological:     General: No focal deficit present.     Mental Status: She is alert and oriented to person, place, and time.     Sensory: No sensory deficit.     Motor: No weakness.  Psychiatric:        Mood and Affect: Mood normal.        Behavior: Behavior normal.      No results found for any visits on 08/09/24.  Last CBC Lab Results  Component Value Date   WBC 5.8 01/19/2024   HGB 14.7 01/19/2024   HCT 45.6  01/19/2024   MCV 94 01/19/2024   MCH 30.4 01/19/2024   RDW 13.3 01/19/2024   PLT 292 01/19/2024   Last metabolic panel Lab Results  Component Value Date   GLUCOSE 78 01/19/2024   NA 139 01/19/2024   K 4.5 01/19/2024   CL 102 01/19/2024   CO2 24 01/19/2024   BUN 17 01/19/2024   CREATININE 0.70 01/19/2024   EGFR 101 01/19/2024   CALCIUM  10.0 01/19/2024   PROT 6.9 01/19/2024   ALBUMIN 4.6 01/19/2024   LABGLOB 2.3 01/19/2024   AGRATIO 1.7 02/03/2023   BILITOT 0.3 01/19/2024   ALKPHOS 55 01/19/2024   AST 17 01/19/2024   ALT 16 01/19/2024   Last lipids Lab Results  Component Value Date   CHOL 164 01/19/2024   HDL 61 01/19/2024   LDLCALC 89 01/19/2024   TRIG 76 01/19/2024   CHOLHDL 2.7 01/19/2024   Last hemoglobin A1c Lab Results  Component Value Date   HGBA1C 5.4 04/14/2024   Last thyroid  functions Lab Results  Component Value Date   TSH 2.540 01/19/2024   FREET4 1.54 10/15/2023   Last vitamin D  Lab Results  Component Value Date   VD25OH 56.2 01/19/2024   Last vitamin B12 and Folate Lab Results  Component Value Date   VITAMINB12 1,575 (H) 10/15/2023      The 10-year ASCVD risk score (Arnett DK, et al., 2019) is: 2.3%  Assessment & Plan:  Type 2 diabetes mellitus with diabetic microalbuminuria, without long-term current use of insulin  (HCC) -     CMP14+EGFR  Multiple environmental allergies  Hypothyroidism, unspecified type -     TSH + free T4 -     CMP14+EGFR  Immunization due -     Flu vaccine trivalent PF, 6mos and older(Flulaval,Afluria,Fluarix,Fluzone)  Screening mammogram, encounter for -     3D Screening Mammogram, Left and  Right; Future  Gastroesophageal reflux disease without esophagitis -     Omeprazole ; Take 1 capsule (20 mg total) by mouth daily.  Dispense: 90 capsule; Refill: 3 -     CMP14+EGFR  Long-term current use of proton pump inhibitor therapy -     B12 and Folate Panel -     CMP14+EGFR  Fibromuscular dysplasia of  carotid artery  Assessment and Plan    Fibromuscular dysplasia of neck Fibromuscular dysplasia in the neck with a history of dissection on the left side. Currently stable with no need for follow-up for 2-3 years as per vascular specialist. - Continue baby aspirin  for prevention of further vascular events. - Continue annual ultrasounds at [facility].  Type 2 diabetes mellitus, controlled Type 2 diabetes mellitus well-controlled with Mounjaro  and weight loss. A1c improved to 5.4. Considering reducing Mounjaro  dose due to decreased appetite and effective weight loss. - Continue Mounjaro  15 mg, consider reducing to 12.5 mg after current supply runs out. - Monitor A1c and adjust medication as needed.  Gastroesophageal reflux disease (GERD) GERD managed with omeprazole  40 mg. Considering dose reduction due to long-term use and weight loss. Symptoms flare up occasionally. - Decrease omeprazole  to 20 mg daily. - Monitor symptoms and adjust dose if necessary. - Check B12 levels at the weight loss clinic.  Hypothyroidism Hypothyroidism managed with levothyroxine  112 mcg. Stable dose with normal TSH levels in April. - Have TSH checked at the next appointment with the weight loss clinic.  Hyperlipidemia Hyperlipidemia managed with medication. Cholesterol levels are currently perfect as per recent labs. - Continue current cholesterol medication. - Monitor cholesterol levels regularly.  Seasonal allergic rhinitis Seasonal allergic rhinitis with increased symptoms due to high dust and dander. Managed with as-needed nasal sprays and allergy  injections. - Continue azelastine  nasal spray as needed. - Continue allergy  injections at [facility]  in [facility]      Flu shot provided today. Schedule mammogram   Return in about 6 months (around 02/07/2025) for Annual Physical.   Benton LITTIE Gave, PA

## 2024-08-14 ENCOUNTER — Ambulatory Visit

## 2024-08-14 ENCOUNTER — Ambulatory Visit (INDEPENDENT_AMBULATORY_CARE_PROVIDER_SITE_OTHER): Payer: Self-pay

## 2024-08-14 ENCOUNTER — Other Ambulatory Visit: Payer: Self-pay

## 2024-08-14 ENCOUNTER — Other Ambulatory Visit (INDEPENDENT_AMBULATORY_CARE_PROVIDER_SITE_OTHER): Payer: Self-pay

## 2024-08-14 VITALS — BP 106/60 | Ht 64.0 in | Wt 167.0 lb

## 2024-08-14 DIAGNOSIS — J309 Allergic rhinitis, unspecified: Secondary | ICD-10-CM

## 2024-08-14 DIAGNOSIS — G5603 Carpal tunnel syndrome, bilateral upper limbs: Secondary | ICD-10-CM | POA: Diagnosis not present

## 2024-08-14 DIAGNOSIS — J302 Other seasonal allergic rhinitis: Secondary | ICD-10-CM

## 2024-08-14 DIAGNOSIS — M1812 Unilateral primary osteoarthritis of first carpometacarpal joint, left hand: Secondary | ICD-10-CM | POA: Diagnosis not present

## 2024-08-14 MED ORDER — METHYLPREDNISOLONE ACETATE 40 MG/ML IJ SUSP
40.0000 mg | Freq: Once | INTRAMUSCULAR | Status: AC
  Administered 2024-08-14: 20 mg via INTRA_ARTICULAR

## 2024-08-14 NOTE — Progress Notes (Signed)
 Subjective:    Patient ID: Kelsey Snow, female    DOB: 58 y.o., 1965/11/18   MRN: [id number]  Chief Complaint: Left thumb CMC arthritis.  Discussed the use of AI scribe software for clinical note transcription with the patient, who gave verbal consent to proceed.  Patient previously followed with Dr. Curtis and received corticosteroid injection of her [facility] joint on the left side on 05/09/2024.  She is interested in repeating this injection, however she also was seen by myself on 07/17/2024 with symptoms concerning for carpal tunnel syndrome on the left. History of Present Illness Kelsey Snow is a 58 year old female with carpal tunnel syndrome and thumb arthritis who presents for a steroid injection in her left thumb.  Left thumb and wrist pain - Persistent pain localized to the left thumb and wrist - Pain severity increases intermittently, particularly in the thumb - Pain impacts daily activities including gym attendance, household chores, and caring for dogs  Paresthesia and numbness - Intermittent numbness and tingling in the left hand, particularly in the thumb and fingers - Symptoms occur especially when thumb pain worsens - Numbness and tingling have been present over the past 1-2 weeks, though not every night - Symptoms can be severe at times  Functional history and prior interventions - History of carpal tunnel release surgery on the contralateral hand with beneficial outcome - 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  No acute osseous abnormalities.  Slight negative ulnar variance.     Objective:   There were no vitals filed for this visit.  Left hand: Tenderness to palpation over the carpometacarpal joint and  positive CMC grind test.  Left wrist: Positive Tinel's.  Negative Durkan's/Phalen's.  Limited ultrasound examination of the left hand and wrist revealing prominent degenerative changes of the carpal metacarpal joint and scaphoid trapezial joints.  Effusion in both of these areas.  Median nerve cross-sectional area measured just proximal to the transverse carpal ligament at 10 mm.  Interpretation: Borderline enlarged median nerve. Severe CMC joint arthritis.  1st [facility] Joint Injection with Ultrasound Guidance Kelsey Snow April 15, 1966 Indications: Pain Procedure Details Following the description of risks including infection bleeding, damage to surrounding structures, patient provided verbal/written consent for Left [facility] Joint injection procedure with ultrasound guidance. Ultrasound was used to identify the [facility] joint and was used to guide this procedure. Patient was sterilely prepped in the usual fashion with chlorhexidine. Sterile ultrasound gel was used for the procedure. Following topical anesthetization with ethyl chloride, the patient was injected into the joint with a solution of 20mg  Depo-Medrol  and 1cc 2% Mepivicaine. This was well visualized under ultrasound, please see associated photographic documentation. Patient tolerated well without complication. Precautions provided. Cleaned and dressing applied.     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 Ultrasound reveals effusion and arthritic changes. Previous steroid injections have been effective, and she prefers this method over surgery due to past experiences with awake procedures. Administer a steroid injection to the left first [facility] joint. Plan a three-month follow-up to assess symptom relief and determine the need for further intervention.  Early carpal tunnel  syndrome, left upper limb    Early carpal tunnel syndrome in the left upper limb causes intermittent numbness and tingling, especially during thumb arthritis flares. Ultrasound shows a normal median nerve size, indicating an early stage. Symptoms are not severe enough for immediate intervention, and she prefers conservative management over surgery. Monitor symptoms and consider a nerve conduction study if they worsen. Refer to [facility] for a nerve conduction study if symptoms return.

## 2024-08-14 NOTE — Addendum Note (Signed)
 Addended by: MARCINE HARLENE SAILOR on: 08/14/2024 11:06 AM   Modules accepted: Orders

## 2024-08-28 ENCOUNTER — Ambulatory Visit: Admitting: Bariatrics

## 2024-08-28 ENCOUNTER — Encounter: Payer: Self-pay | Admitting: Bariatrics

## 2024-08-28 VITALS — BP 137/83 | HR 65 | Ht 64.0 in | Wt 165.0 lb

## 2024-08-28 DIAGNOSIS — E669 Obesity, unspecified: Secondary | ICD-10-CM

## 2024-08-28 DIAGNOSIS — E063 Autoimmune thyroiditis: Secondary | ICD-10-CM

## 2024-08-28 DIAGNOSIS — Z79899 Other long term (current) drug therapy: Secondary | ICD-10-CM | POA: Diagnosis not present

## 2024-08-28 DIAGNOSIS — E119 Type 2 diabetes mellitus without complications: Secondary | ICD-10-CM | POA: Diagnosis not present

## 2024-08-28 DIAGNOSIS — Z7985 Long-term (current) use of injectable non-insulin antidiabetic drugs: Secondary | ICD-10-CM | POA: Diagnosis not present

## 2024-08-28 DIAGNOSIS — Z0000 Encounter for general adult medical examination without abnormal findings: Secondary | ICD-10-CM

## 2024-08-28 DIAGNOSIS — R809 Proteinuria, unspecified: Secondary | ICD-10-CM | POA: Diagnosis not present

## 2024-08-28 DIAGNOSIS — Z6828 Body mass index (BMI) 28.0-28.9, adult: Secondary | ICD-10-CM | POA: Diagnosis not present

## 2024-08-28 DIAGNOSIS — K219 Gastro-esophageal reflux disease without esophagitis: Secondary | ICD-10-CM | POA: Diagnosis not present

## 2024-08-28 DIAGNOSIS — E039 Hypothyroidism, unspecified: Secondary | ICD-10-CM

## 2024-08-28 DIAGNOSIS — E1129 Type 2 diabetes mellitus with other diabetic kidney complication: Secondary | ICD-10-CM | POA: Diagnosis not present

## 2024-08-28 NOTE — Progress Notes (Signed)
 WEIGHT SUMMARY AND BIOMETRICS  Weight Lost Since Last Visit: 4lb  Weight Gained Since Last Visit: 0   Vitals BP: 137/83 Pulse Rate: 65 SpO2: 99 %   Anthropometric Measurements Height: 5' 4 (1.626 m) Weight: 165 lb (74.8 kg) BMI (Calculated): 28.31 Weight at Last Visit: 169lb Weight Lost Since Last Visit: 4lb Weight Gained Since Last Visit: 0 Starting Weight: 260lb Total Weight Loss (lbs): 95 lb (43.1 kg)   Body Composition  Body Fat %: 40.7 % Fat Mass (lbs): 67.4 lbs Muscle Mass (lbs): 93.2 lbs Total Body Water (lbs): 68.8 lbs Visceral Fat Rating : 9   Other Clinical Data Fasting: yes Labs: yes Today's Visit #: 23 Starting Date: 09/16/22    OBESITY [facility] is here to discuss her progress with her obesity treatment plan along with follow-up of her obesity related diagnoses.    Nutrition Plan: the Category 3 plan - 100% adherence.  Current exercise: walking and gym  Interim History:  She is down another 4 lbs since her last visit.  Eating all of the food on the plan., Protein intake is as prescribed, Is not skipping meals, Not journaling consistently., and Water intake is adequate.   Pharmacotherapy: Betzy is on Mounjaro  15 mg SQ weekly Adverse side effects: None Hunger is moderately controlled.  Cravings are moderately controlled.  Assessment/Plan:   Type II Diabetes HgbA1c is at goal. Last A1c was 5.4 Episodes of hypoglycemia: no Medication(s): Mounjaro  15 mg SQ weekly  Lab Results  Component Value Date   HGBA1C 5.4 04/14/2024   HGBA1C 5.6 01/19/2024   HGBA1C 5.6 10/15/2023   Lab Results  Component Value Date   MICROALBUR 80 01/24/2024   LDLCALC 89 01/19/2024   CREATININE 0.70 01/19/2024   No results found for: GFR  Plan: Continue Mounjaro  15 mg SQ weekly Continue all other medications.  Will keep all carbohydrates low  both sweets and starches.  Will continue exercise regimen to 30 to 60 minutes on most days of the week.  Aim for 7 to 9 hours of sleep nightly.  Eat more low glycemic index foods.    Hypothyroidism Stable.  Does not report symptoms associated with uncontrolled hypothyroidism. Medication(s): Levothyroxine  112 mcg daily. Lab Results  Component Value Date   TSH 2.540 01/19/2024    Plan: Continue levothyroxine  at current dose. Counseling: The correct way to take levothyroxine  is fasting, with water, separated by at least 30 minutes from breakfast, and separated by more than 4 hours from calcium , iron, multivitamins, acid reflux medications (PPIs).   Labs done today (CMP, Lipids, HgbA1c, insulin , vitamin D , B 12, CBC and folate).    Generalized Obesity: Current BMI BMI (Calculated): 28.31   Pharmacotherapy Plan Continue  Mounjaro  15 mg SQ weekly  Daleiza is currently in the action stage of change. As such, her goal is to continue with weight loss efforts.  She has agreed to the Category 3 plan.  Exercise goals: All adults should avoid inactivity. Some physical activity is better than none, and adults who participate in any amount of physical activity gain some health benefits.  Behavioral modification strategies: increasing lean protein intake, no meal skipping, meal planning , increase water intake, better snacking choices, planning for success, increasing vegetables, increasing fiber rich foods, avoiding temptations, and mindful eating.  Oneda has agreed to follow-up with our clinic in 4 weeks.      Objective:   VITALS: Per patient if applicable, see vitals. GENERAL: Alert and in no acute distress. CARDIOPULMONARY: No increased WOB. Speaking in clear sentences.  PSYCH: Pleasant and cooperative. Speech normal rate and rhythm. Affect is appropriate. Insight and judgement are appropriate. Attention is focused, linear, and appropriate.  NEURO: Oriented as arrived to appointment on  time with no prompting.   Attestation Statements:   This was prepared with the assistance of Engineer, Civil (consulting).  Occasional wrong-word or sound-a-like substitutions may have occurred due to the inherent limitations of voice recognition   Clayborne Daring, DO

## 2024-08-29 ENCOUNTER — Ambulatory Visit: Payer: Self-pay | Admitting: Urgent Care

## 2024-08-29 LAB — CBC WITH DIFFERENTIAL/PLATELET
Basophils Absolute: 0.1 x10E3/uL (ref 0.0–0.2)
Basos: 2 %
EOS (ABSOLUTE): 0.2 x10E3/uL (ref 0.0–0.4)
Eos: 3 %
Hematocrit: 42.7 % (ref 34.0–46.6)
Hemoglobin: 13.5 g/dL (ref 11.1–15.9)
Immature Grans (Abs): 0 x10E3/uL (ref 0.0–0.1)
Immature Granulocytes: 0 %
Lymphocytes Absolute: 1.8 x10E3/uL (ref 0.7–3.1)
Lymphs: 40 %
MCH: 30.3 pg (ref 26.6–33.0)
MCHC: 31.6 g/dL (ref 31.5–35.7)
MCV: 96 fL (ref 79–97)
Monocytes Absolute: 0.4 x10E3/uL (ref 0.1–0.9)
Monocytes: 9 %
Neutrophils Absolute: 2.1 x10E3/uL (ref 1.4–7.0)
Neutrophils: 46 %
Platelets: 244 x10E3/uL (ref 150–450)
RBC: 4.46 x10E6/uL (ref 3.77–5.28)
RDW: 12.3 % (ref 11.7–15.4)
WBC: 4.6 x10E3/uL (ref 3.4–10.8)

## 2024-08-29 LAB — CMP14+EGFR
ALT: 21 IU/L (ref 0–32)
AST: 25 IU/L (ref 0–40)
Albumin: 4.5 g/dL (ref 3.8–4.9)
Alkaline Phosphatase: 55 IU/L (ref 49–135)
BUN/Creatinine Ratio: 20 (ref 9–23)
BUN: 13 mg/dL (ref 6–24)
Bilirubin Total: 0.3 mg/dL (ref 0.0–1.2)
CO2: 23 mmol/L (ref 20–29)
Calcium: 9.2 mg/dL (ref 8.7–10.2)
Chloride: 103 mmol/L (ref 96–106)
Creatinine, Ser: 0.65 mg/dL (ref 0.57–1.00)
Globulin, Total: 2.5 g/dL (ref 1.5–4.5)
Glucose: 80 mg/dL (ref 70–99)
Potassium: 4.4 mmol/L (ref 3.5–5.2)
Sodium: 140 mmol/L (ref 134–144)
Total Protein: 7 g/dL (ref 6.0–8.5)
eGFR: 103 mL/min/1.73 (ref >59)

## 2024-08-29 LAB — LIPID PANEL WITH LDL/HDL RATIO
Cholesterol, Total: 174 mg/dL (ref 100–199)
HDL: 86 mg/dL (ref >39)
LDL Chol Calc (NIH): 77 mg/dL (ref 0–99)
LDL/HDL Ratio: 0.9 ratio (ref 0.0–3.2)
Triglycerides: 57 mg/dL (ref 0–149)
VLDL Cholesterol Cal: 11 mg/dL (ref 5–40)

## 2024-08-29 LAB — COMPREHENSIVE METABOLIC PANEL WITH GFR
ALT: 20 IU/L (ref 0–32)
AST: 22 IU/L (ref 0–40)
Albumin: 4.2 g/dL (ref 3.8–4.9)
Alkaline Phosphatase: 51 IU/L (ref 49–135)
BUN/Creatinine Ratio: 20 (ref 9–23)
BUN: 13 mg/dL (ref 6–24)
Bilirubin Total: 0.3 mg/dL (ref 0.0–1.2)
CO2: 23 mmol/L (ref 20–29)
Calcium: 9.6 mg/dL (ref 8.7–10.2)
Chloride: 104 mmol/L (ref 96–106)
Creatinine, Ser: 0.65 mg/dL (ref 0.57–1.00)
Globulin, Total: 2.3 g/dL (ref 1.5–4.5)
Glucose: 80 mg/dL (ref 70–99)
Potassium: 4.8 mmol/L (ref 3.5–5.2)
Sodium: 141 mmol/L (ref 134–144)
Total Protein: 6.5 g/dL (ref 6.0–8.5)
eGFR: 103 mL/min/1.73 (ref >59)

## 2024-08-29 LAB — HEMOGLOBIN A1C
Est. average glucose Bld gHb Est-mCnc: 108 mg/dL
Hgb A1c MFr Bld: 5.4 % (ref 4.8–5.6)

## 2024-08-29 LAB — INSULIN, RANDOM: INSULIN: 5.6 u[IU]/mL (ref 2.6–24.9)

## 2024-08-29 LAB — B12 AND FOLATE PANEL
Folate: 8.3 ng/mL (ref >3.0)
Vitamin B-12: 484 pg/mL (ref 232–1245)

## 2024-08-29 LAB — VITAMIN D 25 HYDROXY (VIT D DEFICIENCY, FRACTURES): Vit D, 25-Hydroxy: 57.8 ng/mL (ref 30.0–100.0)

## 2024-08-29 LAB — VITAMIN B12: Vitamin B-12: 520 pg/mL (ref 232–1245)

## 2024-08-29 LAB — FOLATE: Folate: 8.4 ng/mL (ref >3.0)

## 2024-08-29 LAB — TSH+FREE T4
Free T4: 1.81 ng/dL — ABNORMAL HIGH (ref 0.82–1.77)
TSH: 0.464 u[IU]/mL (ref 0.450–4.500)

## 2024-09-01 ENCOUNTER — Ambulatory Visit
Admission: RE | Admit: 2024-09-01 | Discharge: 2024-09-01 | Disposition: A | Discharge status: Home / Self Care | Source: Ambulatory Visit | Attending: Family Medicine | Admitting: Family Medicine

## 2024-09-01 ENCOUNTER — Ambulatory Visit

## 2024-09-01 VITALS — BP 128/84 | HR 72 | Temp 98.2°F | Resp 16

## 2024-09-01 DIAGNOSIS — L03011 Cellulitis of right finger: Secondary | ICD-10-CM

## 2024-09-01 DIAGNOSIS — S60940A Unspecified superficial injury of right index finger, initial encounter: Secondary | ICD-10-CM | POA: Diagnosis not present

## 2024-09-01 DIAGNOSIS — S6991XA Unspecified injury of right wrist, hand and finger(s), initial encounter: Secondary | ICD-10-CM | POA: Diagnosis not present

## 2024-09-01 MED ORDER — DOXYCYCLINE HYCLATE 100 MG PO CAPS: 100.0000 mg | ORAL_CAPSULE | Freq: Two times a day (BID) | ORAL | 0 refills | Status: AC

## 2024-09-01 NOTE — ED Triage Notes (Signed)
 Pt shut right index finger in pet gate 2 days ago. Pt reports pain at the nailbed. Fingertip is swollen and bruised. Has used neosporin.

## 2024-09-01 NOTE — ED Provider Notes (Signed)
 [facility]    CSN: [id number] Arrival date & time: 09/01/24  1158      History   Chief Complaint No chief complaint on file.   HPI Kelsey Snow is a 58 y.o. female.   HPI pleasant 58 year old female presents with a right index finger injury secondary to shutting it in pet gate 2 days ago.  PMH significant for foraminal stenosis of cervical region, hypercholesterolemia, and OCD with depression and anxiety.  Past Medical History:  Diagnosis Date   Allergy  8-9 yrs old   multiple bee stings, yellow jackets in [place]    Anemia    Anxiety    Arthritis    Back pain    Carpal tunnel syndrome    Depression    Diabetes (HCC)    Essential hypertension, benign 03/07/2015   Fibromuscular dysplasia of carotid artery    GERD (gastroesophageal reflux disease)    History of degenerative disc disease    Hx of ectopic pregnancy    Hyperlipidemia    Hypothyroidism    Joint pain    Lumbosacral spondylolysis    Obese    OCD (obsessive compulsive disorder)    Thyroid  disease     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) 01/24/2024   Cheilitis/stomatitis 10/15/2023   Benign paroxysmal positional vertigo 06/08/2023   Hashimoto's thyroiditis 06/01/2023   Verruca left thigh 12/10/2022   Rhinitis 12/03/2022   BMI 40.0-44.9, adult (HCC) 11/18/2022   Type 2 diabetes mellitus with obesity 11/18/2022   Hypertension associated with type 2 diabetes mellitus (HCC) 11/18/2022   Other fatigue 09/16/2022   SOB (shortness of breath) on exertion 09/16/2022   B12 deficiency 09/16/2022   Vitamin D  deficiency 09/16/2022   Absolute anemia 09/16/2022   Seborrheic keratosis 08/14/2022   Benign essential hypertension 08/14/2022   Primary osteoarthritis of first carpometacarpal joint of  left hand 05/21/2022   COVID-19 04/16/2021   Tinnitus 11/13/2020   Lesion of dorsum of nose 08/12/2020   Hemangioma 05/27/2020   Insomnia 05/09/2020   History of endometrial ablation 01/10/2020   Epigastric pain 12/07/2019   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 12/16/2017   Lumbar radiculopathy 11/29/2017   Chronic pain syndrome 11/26/2016   Facet arthropathy, cervical 06/05/2016   Postlaminectomy syndrome of lumbar region 05/01/2015   Carpal tunnel syndrome, left, status post right carpal tunnel release 11/29/2014   Foraminal stenosis of cervical region 05/03/2014   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   Cervical spondylosis without myelopathy 04/13/2014   Perennial allergic rhinitis 11/16/2013   Obsessive compulsive disorder with depression and anxiety 10/10/2013   Hypothyroidism 10/10/2013   Annual physical exam 10/10/2013   Lumbar degenerative disc disease 10/10/2013   Hypercholesterolemia 05/12/2013   Esophageal reflux 03/25/2013    Past Surgical History:  Procedure Laterality Date   BACK SURGERY     L5 to S1 discectomy   ECTOPIC PREGNANCY SURGERY     ENDOMETRIAL ABLATION     ESOPHAGOGASTRODUODENOSCOPY     in 20's had pepic ulcers    SPINE SURGERY  07/22/13   L5/S1 disectomy   TUBAL LIGATION     TUBAL LIGATION     UPPER GASTROINTESTINAL ENDOSCOPY      OB History     Gravida  2   Para  Term      Preterm      AB      Living         SAB      IAB      Ectopic      Multiple      Live Births               Home Medications    Prior to Admission medications   Medication Sig Start Date End Date Taking? Authorizing Provider  doxycycline  (VIBRAMYCIN ) 100 MG capsule Take 1 capsule (100 mg total) by mouth 2 (two) times daily for 7 days. 09/01/24 09/08/24 Yes Teddy Sharper, FNP  Ascorbic Acid (VITAMIN C GUMMIES PO) Take by mouth.    [provider]  aspirin  81 MG EC tablet TAKE 1 TABLET (81 MG  TOTAL) BY MOUTH DAILY. 08/22/15   Curtis Debby PARAS, MD  azelastine  (ASTELIN ) 0.1 % nasal spray Place 2 sprays into both nostrils 2 (two) times daily. Use in each nostril as directed 12/10/23   Lorin Norris, MD  calcium -vitamin D  (OSCAL WITH D) 500-5 MG-MCG tablet Take 1 tablet by mouth.    [provider]  EPINEPHrine  0.3 mg/0.3 mL IJ SOAJ injection Inject 0.3 mg into the muscle as needed for anaphylaxis. 05/31/23   Curtis Debby PARAS, MD  fluticasone  (FLONASE ) 50 MCG/ACT nasal spray SPRAY 2 SPRAYS IN EACH NOSTRIL DAILY 09/21/23   Thekkekandam, Thomas J, MD  ipratropium (ATROVENT ) 0.06 % nasal spray Place 2 sprays into both nostrils 4 (four) times daily. 12/10/23   Lorin Norris, MD  levothyroxine  (SYNTHROID ) 112 MCG tablet TAKE 1 TABLET BY MOUTH DAILY BEFORE BREAKFAST 10/25/23   Curtis Debby PARAS, MD  Magnesium 100 MG CAPS Take by mouth.    [provider]  omeprazole  (PRILOSEC) 20 MG capsule Take 1 capsule (20 mg total) by mouth daily. 08/09/24   Crain, Whitney L, PA  rosuvastatin  (CRESTOR ) 10 MG tablet TAKE 1 TABLET BY MOUTH DAILY 06/12/24   Alvan Dorothyann BIRCH, MD  tirzepatide  (MOUNJARO ) 15 MG/0.5ML Pen Inject 15 mg into the skin once a week. 12/13/23   Curtis Debby PARAS, MD  traMADol  (ULTRAM -ER) 200 MG 24 hr tablet Take 200 mg by mouth daily.    [provider]    Family History Family History  Problem Relation Age of Onset   Asthma Mother    Allergic rhinitis Mother    Depression Mother    Pancreatic cancer Mother    Hypothyroidism Mother    Varicose Veins Mother    Colon polyps Mother    High blood pressure Mother    Cancer Mother    COPD Mother    Hearing loss Mother    Aneurysm Father    AAA (abdominal aortic aneurysm) Father    Sudden death Father    Anxiety disorder Father    Early death Father        Abdominal Anuerysm, age 44 10/18/1998   Drug abuse Sister    Hyperlipidemia Brother    Alcohol abuse Maternal Uncle     Diabetes Maternal Grandmother        Mostly from drinking   Alcohol abuse Maternal Grandmother        Most of life   Cancer Maternal Grandmother        Pancreatic   Alcohol abuse Maternal Aunt        Went thru Rehab   Alcohol abuse Brother        only beer  Arthritis Brother        knees, hips   Depression Brother        Hard on himself   Hypertension Brother        Always salts his beer, and lives on homemade popco   Alcohol abuse Maternal Uncle        Most entire life   Diabetes Maternal Uncle        From alcoholism, when quit no more diabetes   Alcohol abuse Maternal Uncle        Homeless somewhere   Esophageal cancer Neg Hx    Colon cancer Neg Hx    Rectal cancer Neg Hx    Stomach cancer Neg Hx     Social History Social History   Tobacco Use   Smoking status: Never    Passive exposure: Never   Smokeless tobacco: Never  Vaping Use   Vaping status: Never Used  Substance Use Topics   Alcohol use: Yes    Alcohol/week: 6.0 standard drinks of alcohol    Types: 6 Standard drinks or equivalent per week    Comment: weekly   Drug use: No     Allergies   Bee venom and Topiramate    Review of Systems Review of Systems   Physical Exam Triage Vital Signs ED Triage Vitals  Encounter Vitals Group     BP 09/01/24 1207 128/84     Girls Systolic BP Percentile --      Girls Diastolic BP Percentile --      Boys Systolic BP Percentile --      Boys Diastolic BP Percentile --      Pulse Rate 09/01/24 1207 72     Resp 09/01/24 1207 16     Temp 09/01/24 1207 98.2 F (36.8 C)     Temp Source 09/01/24 1207 Oral     SpO2 09/01/24 1207 100 %     Weight --      Height --      Head Circumference --      Peak Flow --      Pain Score 09/01/24 1204 0     Pain Loc --      Pain Education --      Exclude from Growth Chart --    No data found.  Updated Vital Signs BP 128/84 (BP Location: Right Arm)   Pulse 72   Temp 98.2 F (36.8 C) (Oral)   Resp 16   LMP 02/26/2014    SpO2 100%   Visual Acuity Right Eye Distance:   Left Eye Distance:   Bilateral Distance:    Right Eye Near:   Left Eye Near:    Bilateral Near:     Physical Exam Vitals and nursing note reviewed.  Constitutional:      Appearance: She is obese.  HENT:     Head: Normocephalic and atraumatic.     Mouth/Throat:     Mouth: Mucous membranes are moist.     Pharynx: Oropharynx is clear.  Eyes:     Extraocular Movements: Extraocular movements intact.     Conjunctiva/sclera: Conjunctivae normal.     Pupils: Pupils are equal, round, and reactive to light.  Cardiovascular:     Heart sounds: Normal heart sounds.  Pulmonary:     Effort: Pulmonary effort is normal.     Breath sounds: Normal breath sounds. No wheezing, rhonchi or rales.  Musculoskeletal:        General: Normal range of motion.  Comments: Right index finger (dorsum over DIP): Mild to moderate soft tissue swelling with erythema and ecchymosis noted at base of nail plate please see image below  Skin:    General: Skin is warm and dry.  Neurological:     General: No focal deficit present.     Mental Status: She is alert and oriented to person, place, and time. Mental status is at baseline.  Psychiatric:        Mood and Affect: Mood normal.        Behavior: Behavior normal.      UC Treatments / Results  Labs (all labs ordered are listed, but only abnormal results are displayed) Labs Reviewed - No data to display  EKG   Radiology DG Finger Index Right Result Date: 09/01/2024 EXAM: 3 VIEW(S) XRAY OF THE FINGER(S) 09/01/2024 12:24:13 PM COMPARISON: None available. CLINICAL HISTORY: injury FINDINGS: BONES AND JOINTS: No acute fracture. No focal osseous lesion. No joint dislocation. SOFT TISSUES: Mild soft tissue swelling. IMPRESSION: 1. No acute fracture or dislocation. 2. Mild soft tissue swelling. Electronically signed by: Ryan Chess MD 09/01/2024 12:50 PM EST RP Workstation: [id number]     Procedures Procedures (including critical care time)  Medications Ordered in UC Medications - No data to display  Initial Impression / Assessment and Plan / UC Course  I have reviewed the triage vital signs and the nursing notes.  Pertinent labs & imaging results that were available during my care of the patient were reviewed by me and considered in my medical decision making (see chart for details).     MDM: 1.  Injury of right index finger, initial encounter-DG finger index right x-ray results revealed above patient advised; 2.  Paronychia of right index finger-Rx'd doxycycline  100 mg capsule: Take 1 capsule twice daily x 7 days. Advised patient of right finger x-ray results with hardcopy and image provided.  Advised patient take medication as directed with food to completion.  Encouraged to increase daily water intake to 64 ounces per day while taking this medication.  Advised if symptoms worsen and/or unresolved please follow-up with your PCP or here for further evaluation.  Discharged home, hemodynamically stable. Final Clinical Impressions(s) / UC Diagnoses   Final diagnoses:  Injury of right index finger, initial encounter  Paronychia of right index finger     Discharge Instructions      Advised patient of right finger x-ray results with hardcopy and image provided.  Advised patient take medication as directed with food to completion.  Encouraged to increase daily water intake to 64 ounces per day while taking this medication.  Advised if symptoms worsen and/or unresolved please follow-up with your PCP or here for further evaluation.     ED Prescriptions     Medication Sig Dispense Auth. Provider   doxycycline  (VIBRAMYCIN ) 100 MG capsule Take 1 capsule (100 mg total) by mouth 2 (two) times daily for 7 days. 14 capsule Terrill Alperin, FNP      PDMP not reviewed this encounter.   Teddy Sharper, FNP 09/01/24 1259

## 2024-09-01 NOTE — Discharge Instructions (Addendum)
 Advised patient of right finger x-ray results with hardcopy and image provided.  Advised patient take medication as directed with food to completion.  Encouraged to increase daily water intake to 64 ounces per day while taking this medication.  Advised if symptoms worsen and/or unresolved please follow-up with your PCP or here for further evaluation.

## 2024-09-07 DIAGNOSIS — M961 Postlaminectomy syndrome, not elsewhere classified: Secondary | ICD-10-CM | POA: Diagnosis not present

## 2024-09-07 DIAGNOSIS — M7061 Trochanteric bursitis, right hip: Secondary | ICD-10-CM | POA: Diagnosis not present

## 2024-09-07 DIAGNOSIS — M7918 Myalgia, other site: Secondary | ICD-10-CM | POA: Diagnosis not present

## 2024-09-07 DIAGNOSIS — M5451 Vertebrogenic low back pain: Secondary | ICD-10-CM | POA: Diagnosis not present

## 2024-09-07 DIAGNOSIS — M5416 Radiculopathy, lumbar region: Secondary | ICD-10-CM | POA: Diagnosis not present

## 2024-09-07 DIAGNOSIS — M47816 Spondylosis without myelopathy or radiculopathy, lumbar region: Secondary | ICD-10-CM | POA: Diagnosis not present

## 2024-09-11 ENCOUNTER — Ambulatory Visit (HOSPITAL_BASED_OUTPATIENT_CLINIC_OR_DEPARTMENT_OTHER)
Admission: RE | Admit: 2024-09-11 | Discharge: 2024-09-11 | Disposition: A | Discharge status: Home / Self Care | Source: Ambulatory Visit | Attending: Urgent Care | Admitting: Urgent Care

## 2024-09-11 ENCOUNTER — Other Ambulatory Visit: Payer: Self-pay | Admitting: Urgent Care

## 2024-09-11 ENCOUNTER — Encounter: Payer: Self-pay | Admitting: Urgent Care

## 2024-09-11 ENCOUNTER — Encounter (HOSPITAL_BASED_OUTPATIENT_CLINIC_OR_DEPARTMENT_OTHER): Payer: Self-pay

## 2024-09-11 ENCOUNTER — Ambulatory Visit (INDEPENDENT_AMBULATORY_CARE_PROVIDER_SITE_OTHER)

## 2024-09-11 DIAGNOSIS — N6459 Other signs and symptoms in breast: Secondary | ICD-10-CM

## 2024-09-11 DIAGNOSIS — J3089 Other allergic rhinitis: Secondary | ICD-10-CM | POA: Diagnosis not present

## 2024-09-11 DIAGNOSIS — E039 Hypothyroidism, unspecified: Secondary | ICD-10-CM

## 2024-09-11 DIAGNOSIS — J309 Allergic rhinitis, unspecified: Secondary | ICD-10-CM | POA: Diagnosis not present

## 2024-09-11 DIAGNOSIS — Z1231 Encounter for screening mammogram for malignant neoplasm of breast: Secondary | ICD-10-CM | POA: Insufficient documentation

## 2024-09-11 MED ORDER — LEVOTHYROXINE SODIUM 100 MCG PO TABS
100.0000 ug | ORAL_TABLET | Freq: Every day | ORAL | 3 refills | Status: AC
Start: 2024-09-11 — End: ?

## 2024-09-11 NOTE — Progress Notes (Signed)
 [place]

## 2024-09-12 ENCOUNTER — Other Ambulatory Visit: Payer: Self-pay | Admitting: Bariatrics

## 2024-09-12 ENCOUNTER — Encounter: Payer: Self-pay | Admitting: Bariatrics

## 2024-09-12 ENCOUNTER — Other Ambulatory Visit: Payer: Self-pay | Admitting: Urgent Care

## 2024-09-12 DIAGNOSIS — N6459 Other signs and symptoms in breast: Secondary | ICD-10-CM

## 2024-09-12 DIAGNOSIS — M533 Sacrococcygeal disorders, not elsewhere classified: Secondary | ICD-10-CM | POA: Diagnosis not present

## 2024-09-12 MED ORDER — TIRZEPATIDE 12.5 MG/0.5ML ~~LOC~~ SOAJ: 12.5000 mg | SUBCUTANEOUS | 0 refills | Status: DC

## 2024-09-14 ENCOUNTER — Encounter: Payer: Self-pay | Admitting: Urgent Care

## 2024-09-22 ENCOUNTER — Encounter: Payer: Self-pay | Admitting: Bariatrics

## 2024-09-25 ENCOUNTER — Other Ambulatory Visit: Payer: Self-pay

## 2024-09-25 MED ORDER — EPINEPHRINE 0.3 MG/0.3ML IJ SOAJ: 0.3000 mg | INTRAMUSCULAR | 11 refills | Status: AC | PRN

## 2024-09-26 ENCOUNTER — Ambulatory Visit: Admitting: Bariatrics

## 2024-10-10 ENCOUNTER — Ambulatory Visit
Admission: RE | Admit: 2024-10-10 | Discharge: 2024-10-10 | Disposition: A | Discharge status: Home / Self Care | Source: Ambulatory Visit | Attending: Urgent Care | Admitting: Urgent Care

## 2024-10-10 DIAGNOSIS — N6459 Other signs and symptoms in breast: Secondary | ICD-10-CM

## 2024-10-11 ENCOUNTER — Encounter: Payer: Self-pay | Admitting: Bariatrics

## 2024-10-11 ENCOUNTER — Ambulatory Visit: Payer: Self-pay | Admitting: Urgent Care

## 2024-10-11 ENCOUNTER — Ambulatory Visit: Admitting: Bariatrics

## 2024-10-11 VITALS — BP 116/77 | HR 70 | Temp 97.8°F | Ht 64.0 in | Wt 160.0 lb

## 2024-10-11 DIAGNOSIS — R809 Proteinuria, unspecified: Secondary | ICD-10-CM

## 2024-10-11 DIAGNOSIS — E119 Type 2 diabetes mellitus without complications: Secondary | ICD-10-CM

## 2024-10-11 DIAGNOSIS — Z6827 Body mass index (BMI) 27.0-27.9, adult: Secondary | ICD-10-CM | POA: Diagnosis not present

## 2024-10-11 DIAGNOSIS — Z7985 Long-term (current) use of injectable non-insulin antidiabetic drugs: Secondary | ICD-10-CM | POA: Diagnosis not present

## 2024-10-11 DIAGNOSIS — E669 Obesity, unspecified: Secondary | ICD-10-CM | POA: Diagnosis not present

## 2024-10-11 DIAGNOSIS — N6459 Other signs and symptoms in breast: Secondary | ICD-10-CM

## 2024-10-11 NOTE — Progress Notes (Signed)
 "                                                                                                             WEIGHT SUMMARY AND BIOMETRICS  Weight Lost Since Last Visit: 5lb  Weight Gained Since Last Visit: 0lb   Vitals Temp: 97.8 F (36.6 C) BP: 116/77 Pulse Rate: 70 SpO2: 98 %   Anthropometric Measurements Height: 5' 4 (1.626 m) Weight: 160 lb (72.6 kg) BMI (Calculated): 27.45 Weight at Last Visit: 165lb Weight Lost Since Last Visit: 5lb Weight Gained Since Last Visit: 0lb Starting Weight: 260lb Total Weight Loss (lbs): 100 lb (45.4 kg)   Body Composition  Body Fat %: 38.6 % Fat Mass (lbs): 61.8 lbs Muscle Mass (lbs): 93.2 lbs Total Body Water (lbs): 65.2 lbs Visceral Fat Rating : 9   Other Clinical Data Fasting: Yes Labs: No Today's Visit #: 24 Starting Date: 09/16/22    OBESITY Candelaria is here to discuss her progress with her obesity treatment plan along with follow-up of her obesity related diagnoses.    Nutrition Plan: the Category 3 plan - 50% adherence.  Current exercise: cardiovascular workout on exercise equipment and weightlifting  Interim History:  She is down another 5 lbs.  Eating all of the food on the plan., Protein intake is as prescribed, Is not skipping meals, and Water intake is adequate.   Pharmacotherapy: Shela is on Mounjaro  12.5 mg SQ weekly Adverse side effects: None Hunger is moderately controlled. She is eating enough protein.  Cravings are moderately controlled.  Assessment/Plan:   Type II Diabetes HgbA1c is at goal. Last A1c was 5.4 CBGs: Not checking      Episodes of hypoglycemia: no Medication(s): Mounjaro  12.5 mg SQ weekly  Lab Results  Component Value Date   HGBA1C 5.4 08/28/2024   HGBA1C 5.4 04/14/2024   HGBA1C 5.6 01/19/2024   Lab Results  Component Value Date   MICROALBUR 80 01/24/2024   LDLCALC 77 08/28/2024   CREATININE 0.65 08/28/2024   No results found for: GFR  Plan: Continue Mounjaro   12.5 mg SQ weekly Will continue to cook more at home.  Will do more meal planning.  Will keep all carbohydrates low both sweets and starches.  Will continue exercise regimen to 30 to 60 minutes on most days of the week.  Aim for 7 to 9 hours of sleep nightly.  Eat more low glycemic index foods.    Generalized Obesity: Current BMI BMI (Calculated): 27.45   Pharmacotherapy Plan Continue  Mounjaro  12.5 mg SQ weekly  Clarissa is currently in the action stage of change. As such, her goal is to continue with weight loss efforts.  She has agreed to the Category 3 plan.  Exercise goals: All adults should avoid inactivity. Some physical activity is better than none, and adults who participate in any amount of physical activity gain some health benefits.  Behavioral modification strategies: increasing lean protein intake, decreasing simple carbohydrates , no meal skipping, increase water intake, better snacking choices, planning for success, increasing vegetables, increasing  fiber rich foods, avoiding temptations, keep healthy foods in the home, holiday eating strategies , measure portion sizes, and mindful eating.  Sosie has agreed to follow-up with our clinic in 4 weeks.    Objective:   VITALS: Per patient if applicable, see vitals. GENERAL: Alert and in no acute distress. CARDIOPULMONARY: No increased WOB. Speaking in clear sentences.  PSYCH: Pleasant and cooperative. Speech normal rate and rhythm. Affect is appropriate. Insight and judgement are appropriate. Attention is focused, linear, and appropriate.  NEURO: Oriented as arrived to appointment on time with no prompting.   Attestation Statements:   This was prepared with the assistance of Engineer, Civil (consulting).  Occasional wrong-word or sound-a-like substitutions may have occurred due to the inherent limitations of voice recognition.   Clayborne Daring, DO   "

## 2024-10-16 ENCOUNTER — Ambulatory Visit: Admitting: Sports Medicine

## 2024-10-17 ENCOUNTER — Ambulatory Visit (INDEPENDENT_AMBULATORY_CARE_PROVIDER_SITE_OTHER)

## 2024-10-17 DIAGNOSIS — J302 Other seasonal allergic rhinitis: Secondary | ICD-10-CM | POA: Diagnosis not present

## 2024-10-23 ENCOUNTER — Encounter: Payer: Self-pay | Admitting: Urgent Care

## 2024-10-23 ENCOUNTER — Encounter: Payer: Self-pay | Admitting: Internal Medicine

## 2024-10-24 ENCOUNTER — Ambulatory Visit (HOSPITAL_COMMUNITY)

## 2024-10-25 NOTE — Telephone Encounter (Signed)
 Im not sure how dust and dander are measured on accuweather as dander refers to pet dander and usually settles on the ground and depends on animal exposure.    Dust and cold air are nonallergic triggers and can cause these symptoms irregardless of allergy  status.  She was positive to dust mites, which live in house dust which her allergy  shots are addressing.   Unfortunately there are no cures for nonallergic symptoms and she is treating appropriately with her nose sprays.    People can have allergic and nonallergic symptoms, so this does not mean her allergy  injections are not helping.

## 2024-10-26 NOTE — Telephone Encounter (Signed)
 Mychart message sent.

## 2024-10-27 ENCOUNTER — Ambulatory Visit: Payer: Self-pay | Admitting: Urgent Care

## 2024-10-27 ENCOUNTER — Ambulatory Visit (HOSPITAL_COMMUNITY)
Admission: RE | Admit: 2024-10-27 | Discharge: 2024-10-27 | Disposition: A | Source: Ambulatory Visit | Attending: Urgent Care | Admitting: Urgent Care

## 2024-10-27 DIAGNOSIS — N6459 Other signs and symptoms in breast: Secondary | ICD-10-CM | POA: Diagnosis present

## 2024-10-27 MED ORDER — GADOBUTROL 1 MMOL/ML IV SOLN
7.0000 mL | Freq: Once | INTRAVENOUS | Status: AC | PRN
  Administered 2024-10-27: 7 mL via INTRAVENOUS

## 2024-11-03 ENCOUNTER — Ambulatory Visit: Payer: Self-pay | Admitting: Urgent Care

## 2024-11-03 LAB — TSH+FREE T4
Free T4: 1.35 ng/dL (ref 0.82–1.77)
TSH: 3.03 u[IU]/mL (ref 0.450–4.500)

## 2024-11-03 LAB — T3, FREE: T3, Free: 2.6 pg/mL (ref 2.0–4.4)

## 2024-11-08 ENCOUNTER — Ambulatory Visit: Admitting: Bariatrics

## 2024-11-13 ENCOUNTER — Ambulatory Visit: Admitting: Bariatrics

## 2024-11-14 ENCOUNTER — Ambulatory Visit

## 2024-11-21 ENCOUNTER — Encounter: Payer: Self-pay | Admitting: Bariatrics

## 2024-11-21 ENCOUNTER — Ambulatory Visit: Admitting: Bariatrics

## 2024-11-21 VITALS — BP 136/86 | HR 65 | Ht 64.0 in | Wt 164.0 lb

## 2024-11-21 DIAGNOSIS — E669 Obesity, unspecified: Secondary | ICD-10-CM | POA: Diagnosis not present

## 2024-11-21 DIAGNOSIS — R809 Proteinuria, unspecified: Secondary | ICD-10-CM | POA: Diagnosis not present

## 2024-11-21 DIAGNOSIS — Z6828 Body mass index (BMI) 28.0-28.9, adult: Secondary | ICD-10-CM | POA: Diagnosis not present

## 2024-11-21 DIAGNOSIS — E119 Type 2 diabetes mellitus without complications: Secondary | ICD-10-CM

## 2024-11-21 DIAGNOSIS — Z7985 Long-term (current) use of injectable non-insulin antidiabetic drugs: Secondary | ICD-10-CM

## 2024-11-21 DIAGNOSIS — E1129 Type 2 diabetes mellitus with other diabetic kidney complication: Secondary | ICD-10-CM

## 2024-11-21 MED ORDER — TIRZEPATIDE 12.5 MG/0.5ML ~~LOC~~ SOAJ: 12.5000 mg | SUBCUTANEOUS | 0 refills | Status: AC

## 2024-11-24 ENCOUNTER — Other Ambulatory Visit: Payer: Self-pay

## 2024-11-24 ENCOUNTER — Ambulatory Visit (INDEPENDENT_AMBULATORY_CARE_PROVIDER_SITE_OTHER)

## 2024-11-24 ENCOUNTER — Ambulatory Visit

## 2024-11-24 VITALS — BP 130/82 | Ht 64.0 in | Wt 164.0 lb

## 2024-11-24 DIAGNOSIS — M1812 Unilateral primary osteoarthritis of first carpometacarpal joint, left hand: Secondary | ICD-10-CM

## 2024-11-24 DIAGNOSIS — J302 Other seasonal allergic rhinitis: Secondary | ICD-10-CM

## 2024-11-24 MED ORDER — METHYLPREDNISOLONE ACETATE 40 MG/ML IJ SUSP
40.0000 mg | Freq: Once | INTRAMUSCULAR | Status: AC
  Administered 2024-11-24: 20 mg via INTRA_ARTICULAR

## 2024-11-24 NOTE — Progress Notes (Unsigned)
" ° °  Subjective:    Patient ID: Kelsey Snow, female    DOB: 59 y.o., [birth year]   MRN: [id number]  Chief Complaint: Left [facility] joint arthritis  History of Present Illness      Objective:   There were no vitals filed for this visit. [facility] Joint Injection with Ultrasound Guidance Kelsey Snow 12-04-1965 Indications: Pain Procedure Details Following the description of risks including infection bleeding, damage to surrounding structures, patient provided verbal/written consent for Left [facility] Joint injection procedure with ultrasound guidance. Ultrasound was used to identify the [facility] joint and was used to guide this procedure. Patient was sterilely prepped in the usual fashion with chlorhexidine. Sterile ultrasound gel was used for the procedure. Following topical anesthetization with ethyl chloride, the patient was injected into the joint with a solution of 20mg  Depo-Medrol  and 1cc 2% Mepivicaine. This was well visualized under ultrasound, please see associated photographic documentation. Patient tolerated well without complication. Precautions provided. Cleaned and dressing applied.     Assessment & Plan:   Assessment & Plan    "

## 2024-12-19 ENCOUNTER — Ambulatory Visit: Admitting: Bariatrics

## 2025-02-07 ENCOUNTER — Encounter: Admitting: Urgent Care
